# Patient Record
Sex: Female | Born: 1935 | Race: White | Hispanic: No | Smoking: Never smoker
Health system: Southern US, Community
[De-identification: ages and names within clinical notes are randomized; demographics above are authoritative.]

## PROBLEM LIST (undated history)

## (undated) DIAGNOSIS — E7800 Pure hypercholesterolemia, unspecified: Secondary | ICD-10-CM

## (undated) DIAGNOSIS — I480 Paroxysmal atrial fibrillation: Secondary | ICD-10-CM

## (undated) DIAGNOSIS — G2581 Restless legs syndrome: Secondary | ICD-10-CM

## (undated) DIAGNOSIS — I10 Essential (primary) hypertension: Secondary | ICD-10-CM

## (undated) DIAGNOSIS — I639 Cerebral infarction, unspecified: Secondary | ICD-10-CM

## (undated) DIAGNOSIS — M1990 Unspecified osteoarthritis, unspecified site: Secondary | ICD-10-CM

## (undated) DIAGNOSIS — I5032 Chronic diastolic (congestive) heart failure: Secondary | ICD-10-CM

## (undated) DIAGNOSIS — R001 Bradycardia, unspecified: Secondary | ICD-10-CM

## (undated) DIAGNOSIS — F5104 Psychophysiologic insomnia: Secondary | ICD-10-CM

## (undated) DIAGNOSIS — R413 Other amnesia: Principal | ICD-10-CM

## (undated) DIAGNOSIS — I4891 Unspecified atrial fibrillation: Secondary | ICD-10-CM

## (undated) HISTORY — DX: Pure hypercholesterolemia, unspecified: E78.00

## (undated) HISTORY — DX: Essential (primary) hypertension: I10

## (undated) HISTORY — DX: Restless legs syndrome: G25.81

## (undated) HISTORY — DX: Chronic diastolic (congestive) heart failure: I50.32

## (undated) HISTORY — DX: Psychophysiologic insomnia: F51.04

## (undated) HISTORY — DX: Bradycardia, unspecified: R00.1

## (undated) HISTORY — DX: Unspecified atrial fibrillation: I48.91

## (undated) HISTORY — DX: Paroxysmal atrial fibrillation: I48.0

## (undated) HISTORY — DX: Other amnesia: R41.3

## (undated) HISTORY — DX: Unspecified osteoarthritis, unspecified site: M19.90

## (undated) HISTORY — DX: Cerebral infarction, unspecified: I63.9

---

## 1997-11-02 HISTORY — PX: REPLACEMENT TOTAL KNEE BILATERAL: SUR1225

## 1997-12-11 ENCOUNTER — Ambulatory Visit (HOSPITAL_COMMUNITY): Admission: RE | Admit: 1997-12-11 | Discharge: 1997-12-11 | Payer: Self-pay | Admitting: Orthopedic Surgery

## 1998-11-27 ENCOUNTER — Other Ambulatory Visit: Admission: RE | Admit: 1998-11-27 | Discharge: 1998-11-27 | Payer: Self-pay | Admitting: *Deleted

## 1999-05-16 ENCOUNTER — Other Ambulatory Visit: Admission: RE | Admit: 1999-05-16 | Discharge: 1999-05-16 | Payer: Self-pay | Admitting: *Deleted

## 1999-05-16 ENCOUNTER — Encounter (INDEPENDENT_AMBULATORY_CARE_PROVIDER_SITE_OTHER): Payer: Self-pay

## 1999-11-03 HISTORY — PX: OTHER SURGICAL HISTORY: SHX169

## 2000-05-18 ENCOUNTER — Ambulatory Visit (HOSPITAL_COMMUNITY): Admission: RE | Admit: 2000-05-18 | Discharge: 2000-05-18 | Payer: Self-pay | Admitting: Gastroenterology

## 2000-05-26 ENCOUNTER — Encounter: Payer: Self-pay | Admitting: Orthopedic Surgery

## 2000-05-31 ENCOUNTER — Inpatient Hospital Stay (HOSPITAL_COMMUNITY): Admission: RE | Admit: 2000-05-31 | Discharge: 2000-06-02 | Payer: Self-pay | Admitting: Orthopedic Surgery

## 2000-06-02 ENCOUNTER — Inpatient Hospital Stay (HOSPITAL_COMMUNITY)
Admission: RE | Admit: 2000-06-02 | Discharge: 2000-06-08 | Payer: Self-pay | Admitting: Physical Medicine & Rehabilitation

## 2000-08-17 ENCOUNTER — Other Ambulatory Visit: Admission: RE | Admit: 2000-08-17 | Discharge: 2000-08-17 | Payer: Self-pay | Admitting: *Deleted

## 2001-04-27 ENCOUNTER — Encounter: Payer: Self-pay | Admitting: Rheumatology

## 2001-04-27 ENCOUNTER — Encounter: Admission: RE | Admit: 2001-04-27 | Discharge: 2001-04-27 | Payer: Self-pay | Admitting: Rheumatology

## 2001-09-05 ENCOUNTER — Encounter: Payer: Self-pay | Admitting: Orthopedic Surgery

## 2001-09-05 ENCOUNTER — Encounter: Admission: RE | Admit: 2001-09-05 | Discharge: 2001-09-05 | Payer: Self-pay | Admitting: Orthopedic Surgery

## 2001-09-06 ENCOUNTER — Ambulatory Visit (HOSPITAL_BASED_OUTPATIENT_CLINIC_OR_DEPARTMENT_OTHER): Admission: RE | Admit: 2001-09-06 | Discharge: 2001-09-07 | Payer: Self-pay | Admitting: Orthopedic Surgery

## 2002-01-02 ENCOUNTER — Other Ambulatory Visit: Admission: RE | Admit: 2002-01-02 | Discharge: 2002-01-02 | Payer: Self-pay | Admitting: *Deleted

## 2003-10-08 ENCOUNTER — Emergency Department (HOSPITAL_COMMUNITY): Admission: AD | Admit: 2003-10-08 | Discharge: 2003-10-08 | Payer: Self-pay | Admitting: Family Medicine

## 2004-02-26 ENCOUNTER — Encounter: Admission: RE | Admit: 2004-02-26 | Discharge: 2004-02-26 | Payer: Self-pay | Admitting: Orthopedic Surgery

## 2004-02-29 ENCOUNTER — Ambulatory Visit (HOSPITAL_COMMUNITY): Admission: RE | Admit: 2004-02-29 | Discharge: 2004-02-29 | Payer: Self-pay | Admitting: Orthopedic Surgery

## 2004-02-29 ENCOUNTER — Ambulatory Visit (HOSPITAL_BASED_OUTPATIENT_CLINIC_OR_DEPARTMENT_OTHER): Admission: RE | Admit: 2004-02-29 | Discharge: 2004-02-29 | Payer: Self-pay | Admitting: Orthopedic Surgery

## 2005-11-02 DIAGNOSIS — I639 Cerebral infarction, unspecified: Secondary | ICD-10-CM

## 2005-11-02 HISTORY — DX: Cerebral infarction, unspecified: I63.9

## 2006-02-24 ENCOUNTER — Encounter: Admission: RE | Admit: 2006-02-24 | Discharge: 2006-02-24 | Payer: Self-pay | Admitting: Neurology

## 2006-03-17 ENCOUNTER — Encounter (INDEPENDENT_AMBULATORY_CARE_PROVIDER_SITE_OTHER): Payer: Self-pay | Admitting: *Deleted

## 2006-03-17 ENCOUNTER — Ambulatory Visit (HOSPITAL_COMMUNITY): Admission: RE | Admit: 2006-03-17 | Discharge: 2006-03-17 | Payer: Self-pay | Admitting: Neurology

## 2007-07-23 ENCOUNTER — Emergency Department (HOSPITAL_COMMUNITY): Admission: EM | Admit: 2007-07-23 | Discharge: 2007-07-23 | Payer: Self-pay | Admitting: Emergency Medicine

## 2007-11-03 HISTORY — PX: OTHER SURGICAL HISTORY: SHX169

## 2007-11-03 HISTORY — PX: CATARACT EXTRACTION: SUR2

## 2007-12-07 ENCOUNTER — Encounter: Admission: RE | Admit: 2007-12-07 | Discharge: 2007-12-07 | Payer: Self-pay | Admitting: Orthopedic Surgery

## 2008-06-06 ENCOUNTER — Encounter: Admission: RE | Admit: 2008-06-06 | Discharge: 2008-06-06 | Payer: Self-pay | Admitting: Internal Medicine

## 2009-02-07 ENCOUNTER — Emergency Department (HOSPITAL_COMMUNITY): Admission: EM | Admit: 2009-02-07 | Discharge: 2009-02-08 | Payer: Self-pay | Admitting: Emergency Medicine

## 2009-06-21 ENCOUNTER — Ambulatory Visit (HOSPITAL_COMMUNITY): Admission: RE | Admit: 2009-06-21 | Discharge: 2009-06-21 | Payer: Self-pay | Admitting: Obstetrics and Gynecology

## 2009-08-21 ENCOUNTER — Encounter: Admission: RE | Admit: 2009-08-21 | Discharge: 2009-08-21 | Payer: Self-pay | Admitting: Orthopedic Surgery

## 2009-10-23 ENCOUNTER — Ambulatory Visit (HOSPITAL_COMMUNITY): Admission: RE | Admit: 2009-10-23 | Discharge: 2009-10-23 | Payer: Self-pay | Admitting: Orthopaedic Surgery

## 2010-05-08 ENCOUNTER — Inpatient Hospital Stay (HOSPITAL_COMMUNITY): Admission: RE | Admit: 2010-05-08 | Discharge: 2010-05-12 | Payer: Self-pay | Admitting: Orthopedic Surgery

## 2011-01-18 LAB — BASIC METABOLIC PANEL
BUN: 15 mg/dL (ref 6–23)
BUN: 6 mg/dL (ref 6–23)
CO2: 25 mEq/L (ref 19–32)
CO2: 30 mEq/L (ref 19–32)
Calcium: 8.3 mg/dL — ABNORMAL LOW (ref 8.4–10.5)
Calcium: 8.6 mg/dL (ref 8.4–10.5)
Chloride: 93 mEq/L — ABNORMAL LOW (ref 96–112)
Chloride: 97 mEq/L (ref 96–112)
Chloride: 99 mEq/L (ref 96–112)
Creatinine, Ser: 0.59 mg/dL (ref 0.4–1.2)
Creatinine, Ser: 0.71 mg/dL (ref 0.4–1.2)
GFR calc Af Amer: >60 mL/min (ref >60)
GFR calc Af Amer: >60 mL/min (ref >60)
GFR calc Af Amer: >60 mL/min (ref >60)
GFR calc non Af Amer: >60 mL/min (ref >60)
GFR calc non Af Amer: >60 mL/min (ref >60)
GFR calc non Af Amer: >60 mL/min (ref >60)
GFR calc non Af Amer: >60 mL/min (ref >60)
Glucose, Bld: 108 mg/dL — ABNORMAL HIGH (ref 70–99)
Glucose, Bld: 118 mg/dL — ABNORMAL HIGH (ref 70–99)
Potassium: 3.3 mEq/L — ABNORMAL LOW (ref 3.5–5.1)
Potassium: 3.6 mEq/L (ref 3.5–5.1)
Potassium: 3.9 mEq/L (ref 3.5–5.1)
Potassium: 4.1 mEq/L (ref 3.5–5.1)
Sodium: 129 mEq/L — ABNORMAL LOW (ref 135–145)
Sodium: 130 mEq/L — ABNORMAL LOW (ref 135–145)
Sodium: 131 mEq/L — ABNORMAL LOW (ref 135–145)
Sodium: 133 mEq/L — ABNORMAL LOW (ref 135–145)
Sodium: 135 mEq/L (ref 135–145)

## 2011-01-18 LAB — CBC
HCT: 28.5 % — ABNORMAL LOW (ref 36.0–46.0)
HCT: 28.6 % — ABNORMAL LOW (ref 36.0–46.0)
HCT: 32.3 % — ABNORMAL LOW (ref 36.0–46.0)
HCT: 35.7 % — ABNORMAL LOW (ref 36.0–46.0)
Hemoglobin: 12.1 g/dL (ref 12.0–15.0)
Hemoglobin: 9.6 g/dL — ABNORMAL LOW (ref 12.0–15.0)
MCH: 32.5 pg (ref 26.0–34.0)
MCH: 33.2 pg (ref 26.0–34.0)
MCHC: 33.3 g/dL (ref 30.0–36.0)
MCHC: 33.9 g/dL (ref 30.0–36.0)
Platelets: 148 10*3/uL — ABNORMAL LOW (ref 150–400)
Platelets: 171 10*3/uL (ref 150–400)
Platelets: 173 10*3/uL (ref 150–400)
RBC: 2.93 MIL/uL — ABNORMAL LOW (ref 3.87–5.11)
RBC: 3.31 MIL/uL — ABNORMAL LOW (ref 3.87–5.11)
RBC: 3.64 MIL/uL — ABNORMAL LOW (ref 3.87–5.11)
RDW: 13.6 % (ref 11.5–15.5)
RDW: 13.9 % (ref 11.5–15.5)
RDW: 13.9 % (ref 11.5–15.5)
WBC: 6.3 10*3/uL (ref 4.0–10.5)
WBC: 7 10*3/uL (ref 4.0–10.5)

## 2011-01-18 LAB — TYPE AND SCREEN: ABO/RH(D): O POS

## 2011-01-18 LAB — URINALYSIS, ROUTINE W REFLEX MICROSCOPIC
Hgb urine dipstick: NEGATIVE
Ketones, ur: NEGATIVE mg/dL
Specific Gravity, Urine: 1.012 (ref 1.005–1.030)
pH: 6 (ref 5.0–8.0)

## 2011-01-18 LAB — DIFFERENTIAL
Basophils Relative: 0 % (ref 0–1)
Basophils Relative: 1 % (ref 0–1)
Eosinophils Absolute: 0.2 10*3/uL (ref 0.0–0.7)
Lymphocytes Relative: 17 % (ref 12–46)
Monocytes Absolute: 0.8 10*3/uL (ref 0.1–1.0)
Monocytes Relative: 12 % (ref 3–12)
Monocytes Relative: 9 % (ref 3–12)
Neutro Abs: 4.4 10*3/uL (ref 1.7–7.7)
Neutrophils Relative %: 65 % (ref 43–77)
Neutrophils Relative %: 70 % (ref 43–77)

## 2011-01-18 LAB — POCT I-STAT 4, (NA,K, GLUC, HGB,HCT)
Glucose, Bld: 141 mg/dL — ABNORMAL HIGH (ref 70–99)
Hemoglobin: 13.3 g/dL (ref 12.0–15.0)

## 2011-01-18 LAB — CARDIAC PANEL(CRET KIN+CKTOT+MB+TROPI)
CK, MB: 3.5 ng/mL (ref 0.3–4.0)
CK, MB: 5.3 ng/mL — ABNORMAL HIGH (ref 0.3–4.0)
CK, MB: 6.9 ng/mL (ref 0.3–4.0)
Relative Index: 2.5 (ref 0.0–2.5)
Relative Index: 4.2 — ABNORMAL HIGH (ref 0.0–2.5)
Total CK: 156 U/L (ref 7–177)
Troponin I: 0.02 ng/mL (ref 0.00–0.06)

## 2011-01-18 LAB — APTT: aPTT: 34 seconds (ref 24–37)

## 2011-01-18 LAB — URINE MICROSCOPIC-ADD ON

## 2011-01-18 LAB — TSH: TSH: 1.852 u[IU]/mL (ref 0.350–4.500)

## 2011-01-18 LAB — SURGICAL PCR SCREEN
MRSA, PCR: NEGATIVE
Staphylococcus aureus: NEGATIVE

## 2011-01-23 ENCOUNTER — Emergency Department (HOSPITAL_COMMUNITY): Payer: Medicare Other

## 2011-01-23 ENCOUNTER — Emergency Department (HOSPITAL_COMMUNITY)
Admission: EM | Admit: 2011-01-23 | Discharge: 2011-01-23 | Disposition: A | Discharge status: Home / Self Care | Payer: Medicare Other | Attending: Emergency Medicine | Admitting: Emergency Medicine

## 2011-01-23 DIAGNOSIS — I10 Essential (primary) hypertension: Secondary | ICD-10-CM | POA: Insufficient documentation

## 2011-01-23 DIAGNOSIS — E7800 Pure hypercholesterolemia, unspecified: Secondary | ICD-10-CM | POA: Insufficient documentation

## 2011-01-23 DIAGNOSIS — R079 Chest pain, unspecified: Secondary | ICD-10-CM | POA: Insufficient documentation

## 2011-01-23 DIAGNOSIS — Z79899 Other long term (current) drug therapy: Secondary | ICD-10-CM | POA: Insufficient documentation

## 2011-01-23 DIAGNOSIS — M129 Arthropathy, unspecified: Secondary | ICD-10-CM | POA: Insufficient documentation

## 2011-01-23 DIAGNOSIS — M25519 Pain in unspecified shoulder: Secondary | ICD-10-CM | POA: Insufficient documentation

## 2011-01-23 DIAGNOSIS — W010XXA Fall on same level from slipping, tripping and stumbling without subsequent striking against object, initial encounter: Secondary | ICD-10-CM | POA: Insufficient documentation

## 2011-01-23 DIAGNOSIS — S2239XA Fracture of one rib, unspecified side, initial encounter for closed fracture: Secondary | ICD-10-CM | POA: Insufficient documentation

## 2011-01-23 DIAGNOSIS — M546 Pain in thoracic spine: Secondary | ICD-10-CM | POA: Insufficient documentation

## 2011-03-20 NOTE — Discharge Summary (Signed)
[place]. [facility]  Patient:    [name], [name]                            MRN: [id number] Adm. Date:  [id number] Disc. Date: [id number] Attending:  Gilmore Laroche Dictator:   Liliane Channel, P.A.-C.                           Discharge Summary  PRIMARY DIAGNOSIS THIS ADMISSION:  End-stage degenerative joint disease of the left knee.  PROCEDURE WHILE IN HOSPITAL:  Left total knee arthroplasty.  HISTORY OF PRESENT ILLNESS:  The patient is a 77 year old woman with complaints of increasing pain in her left knee.  She underwent a right total knee in January of 1999 with no difficulties postoperatively. She has a many year history of pain in her left knee but has been increasing greatly over the last several months. She has failed conservative measures including physical therapy, steroid injection, nonsteroidal anti-inflammatories, and rest.  She now wishes to proceed with a left total knee arthroplasty understanding the risks versus the benefits.  ALLERGIES:  CODEINE causes nausea.  MEDICATIONS ON ADMISSION: 1. Celebrex 200 mg one p.o. b.i.d. 2. Prevacid 30 mg one p.o. q.d. 3. Pravachol 20 mg one p.o. q.d. 4. Centrum Silver one p.o. q.d. 5. Miacalcin one spray q.d. 6. Caltrate 600+ D _______ one p.o. q.d.  X-rays show positive bone on bone changes in the medial compartment  with positive erosions.  PAST MEDICAL HISTORY:  Childhood disease.  Adult history:  DJD and elevated cholesterol.  PAST SURGICAL HISTORY:  Knee scope in 1994, metacarpal surgery in 1995, D&C in 1988, total knee arthroplasty in 1999.  She has had trouble with severe nausea postoperatively.  SOCIAL HISTORY:  No tobacco.  Positive alcohol use, three or four drinks per week.  No IV drug abuse.  She lives with her husband.  The patient is a retired Pharmacist, hospital.  FAMILY HISTORY:  Mother deceased secondary to CVA.  Father deceases secondary to cancer.  REVIEW OF SYSTEMS:  Positive for  glasses only.  Denies shortness of breath, chest pain or other responses.  PHYSICAL EXAMINATION:  GENERAL APPEARANCE:  The patient is a 65 inch tall, 160 pound female.  VITAL SIGNS:  Temperature 98.1, pulse 60, respiratory rate 16, blood pressure 140/78.  HEENT:  Head is normocephalic and atraumatic.  Ears: TMs are clear.  Eyes: Pupils are equal, round, reactive to light and accommodation.  Nose is clear. Throat is benign.  NECK:  Supple.  Full range of motion.  CHEST:  Clear to auscultation and percussion.  CARDIOVASCULAR:  Regular rate and rhythm.  ABDOMEN:  Soft and nontender with no masses.  Bowel sounds 2+.  EXTREMITIES:  Right knee with well-healed normal scar, neurovascularly intact. Range of motion 0 to 115.  Left knee rule out 5 to 110 degrees with positive crepitus and pain.  Pain in joint.  Neurovascularly intact.  Ligamentously stable.  SKIN: Within normal limits.  PREOPERATIVE LABS:  CBC, CMET, chest x-ray, EKG, PT, and PTT were all within normal limits with the exception of hemoglobin which was 10.8, hematocrit 32.9.  HOSPITAL COURSE:  On the day of admission the patient was taken to the operating room at [facility] where she underwent a left total knee arthroplasty utilizing Osteonics scorpio components, #7 femur, #7 tibia, #7 modified medial patellar button, 10  mm scorpio spacer, medical components were all cemented.  The patient was placed on perioperative antibiotics. For the first 72 hours she was placed on postoperative Coumadin prophylaxis with a target INR of 1.5 to 2 per pharmacy protocol. She was placed on postoperatively PCA for pain control and Hemovac drain was placed in the knee for drainage.  Physical therapy was done the first postoperative day with CPM. Postoperative day #1, the patient was awake and alert, moderate pain, no vomiting, minimal nausea.  Vital signs were stable. Drain output 200 cc, neurovascularly intact  throughout.  Hemoglobin 8.5, PT 14.7.  Physical therapy was continued. She was transfused one unit of autologous blood secondary to lethargy. The second postoperative day, the patient was without complaints. She was afebrile. Hemoglobin 9.3, hematocrit 28.0.  INR 1.5. Dressing was dry.  Hemovac output 100 cc with quiescent tip and was discontinued without difficulty.  Calf was soft and nontender. Foley was discontinued. PCA was discontinued.  She continued with the CPM. She was seen in consultation by physical medicine rehabilitation and felt that she would benefit from rehab stay and a bed was available. She was admitted to inpatient rehab on the second postoperative day.  At the time of her transfer she was well-controlled with oral pain medications. The wound was benign. She was on a regular diet and was making steady progress, weightbearing as tolerated with total knee precautions. We will continue to follow her during her hospital stay in rehab and postoperatively as well. DD:  06/24/00 TD:  06/25/00 Job: [id number]

## 2011-03-20 NOTE — Op Note (Signed)
[place]. [facility]  Patient:    [name]                             MRN: [id number] Proc. Date: 05/31/00 Adm. Date:  [id number] Attending:  Debroah Baller                           Operative Report  PREOPERATIVE DIAGNOSIS:  Degenerative arthritis of the left knee.  POSTOPERATIVE DIAGNOSIS:  Degenerative arthritis of the left knee.  PROCEDURE:  Left total knee arthroplasty.  All components cemented.  Osteonics Scorpio components #7 femur, #7 tibia, #7 modified medial patellar button, and a 10 mm Scorpio spacer.  SURGEON:  Dr. Kathalene Frames. Mayer Camel.  FIRST ASSISTANT:  Alta Corning, M.D.  SECOND ASSISTANT:  Liliane Channel, P.A.C.  ANESTHESIA:  General endotracheal.  ESTIMATED BLOOD LOSS:  Minimal.  FLUID REPLACEMENT:  1100 cc of crystalloids.  DRAINS PLACED:  None.  TOURNIQUET TIME:  1 hour 15 minutes.  INDICATIONS FOR PROCEDURE:  A 77 year old woman who underwent a successful right total knee earlier this year, now desires a left total knee to relieve pain and increase function.  She is aware of the risks and benefits of surgery of bone-on-bone, arthritic changes, erosions of the medial compartment of the left knee.  DESCRIPTION OF PROCEDURE:  The patient is identified by arm band, taken to the operating room at [facility] main hospital, appropriate anesthetic monitors were attached, and general endotracheal anesthesia induced with the patient in supine position.  A lateral post and a foot positioning clamp were then applied to the table.  Tourniquet applied high to the left thigh, and the left lower extremity prepped and draped in the usual sterile fashion from the ankle to the tourniquet.  The limb was wrapped with an Esmarch bandage.  The tourniquet inflated to 350 mmHg, and an anterior midline incision was made approximately 20 cm in length centered over the patella going from above the patella longitudinally over the center of the patella,  and then centered medial to a 3 cm distal to the tibial tubercle.  Small bleeders, skin, and subcutaneous tissue identified and cauterized.  The transverse retinaculum over the patella was incised in line with the skin incision, allowing a medial parapatellar arthrotomy with removal of the prepatellar fat pad and the medial and lateral meniscus upon flexion of the knee, as well as the cruciate ligaments.  A MacKay retractor was placed in the notch to translate the tibia anteriorly.  A lateral Homan retractor and a medial Z retractor were placed as well to enhance the exposure of the tibial spines which were then removed with a power saw after everting the patella.  The proximal tibia was then instrumented with the Osteonics step drill, followed by the intramedullary rod and the 0 degree posterior slope cutting guide, which was then held into place with two pins.  This allowed [place] to remove approximately 2 to 3 mm of bone medially, and 8 to 9 mm of bone laterally.  This was because of the medial erosion of the bone.  We then directed our attention to the distal femur which was instrumented with the Osteonics step drill 2 mm anterior to the PCL origin, followed by a 5 degree left 10 mm distal femoral cutting guide which was pinned into place allowing the distal femoral cut.  We then sized for  a #7 femoral component, and pinned it in 3 degrees of external rotation on the Champer guide cutting base plate.  The anterior, posterior, anterior Champer and posterior Champer cuts were made followed by the Scorpio box cut, and removal of all exstrenuous osteophytes.  The everted patella was then grasped with the patellar cutting guide, and the posterior 9 mm of the patella resected, sized to a #7 modified medial patellar button guide and drilled.  We then hammered into place a #7 left trial femoral component, a 7 trial base plate with a 10 mm PS trial spacer.  Then he was taken through a range  of motion, excellent stability noted.  The patella was noted to track well with the 7 trial button.  We set the rotation of the tibial base plate with intramedullary rods, referencing out the second toe and the iliac crest going about an inch medially.  At this point the trial components were removed.  The tibial base plate was pinned into place, and the deltofit punches were brought up to a #7 cemented punch, and then all surfaces were water-picked clean.  A single batch of palacos methacrylate cement with 750 mg of Zinacef was then mixed, applied to all bony and metallic surfaces, and in order we hammered into place a #7 tibial base plate, a #7 left cemented femoral Scorpio component, a 10 mm Scorpio spacer, and a #7 modified medial patellar button. Excess cement was removed, and the bone had been cleansed and water-picked and dried prior to inserting the cement.  After the cement had cured, we took the knee through a range of motion, noted excellent patellar tracking, good ligamentous stability.  Small bits of cement and bone were removed with rongeurs.  Medium Hemovac drains were placed in the wound.  The parapatellar arthrotomy closed with running #1 Vicryl suture, the subcutaneous tissues with 0 and 2-0 undyed Vicryl suture, and the skin with skin staples.  A dressing of xeroform, 4 x 8 dressings, sponges, Webril, and an Ace wrap was applied.  The tourniquet was let down.  The patient was awakened and taken to the recovery room after placement of femoral block.DD:  05/31/00 TD:  06/01/00 Job: [id number]

## 2011-03-20 NOTE — Op Note (Signed)
NAME:  [name], [name]                               ACCOUNT NO.:  [id number]   MEDICAL RECORD NO.:  [id number]                   PATIENT TYPE:  AMB   LOCATION:  [place]                                  FACILITY:  [facility]   PHYSICIAN:  Youlanda Mighty. Luisa Dago., M.D.          DATE OF BIRTH:  17-Sep-1928   DATE OF PROCEDURE:  02/29/2004  DATE OF DISCHARGE:                                 OPERATIVE REPORT   PREOPERATIVE DIAGNOSIS:  Unstable right index finger distal interphalangeal  joint, 22 years status post implant arthroplasty with a Silastic Swanson  flexible hinge implant.   POSTOPERATIVE DIAGNOSIS:  Unstable right index finger distal interphalangeal  joint, 22 years status post implant arthroplasty with a Silastic Swanson  flexible hinge implant.  Identification of fracture of distal  interphalangeal implant and significant pseudocapsule formation.   OPERATION PERFORMED:  1. Removal of Silastic interphalangeal implant fragments and curettage of     middle and distal phalangeal intramedullary canals to remove granulation     tissue and pseudocapsule following Silastic implant.  2. Arthrodesis of right index finger distal interphalangeal joint with     cancellous allograft and 0.035 inch Kirschner wire fixation times three.   SURGEON:  Youlanda Mighty. Sypher, M.D.   ASSISTANT:  Julian Reil, P.A.   ANESTHESIA:  General sedation/axillary block.   SUPERVISING ANESTHESIOLOGIST:  Jessy Oto. Albertina Parr, M.D.   INDICATIONS FOR PROCEDURE:  [name] is a 77 year old woman who is a long  term patient with the orthopedic and hand specialist.  She is status post  bilateral thumb carpometacarpal soft tissue interposition arthroplasty and  has had a number of arthritis procedures including implant arthroplasty of  interphalangeal joints.  She has also had arthrodesis of some distal  interphalangeal joints for pain control.   One week prior she fell, sustaining a comminuted impacted fracture of her  right  distal radius and was seen by Dr. Burney Gauze, who was on call at that  time.  Dr. Burney Gauze performed a closed reduction achieving a virtually  anatomic reduction; however, given the nature of the fracture, it was well  understood by all parties that it was likely that her fracture would subside  and require open reduction internal fixation with a volar plate system.  After informed consent and upon return to the office with x-rays  demonstrating settling of her fracture, Dr. Burney Gauze recommended proceeding  with ORIF of the radius and, as we had been planning, arthrodesis of the  right index finger DIP joint due to a fractured and unstable implant  arthroplasty.  Veronica. [place] [name] requested that both procedures be performed under a  single anesthesia at this time.  After informed consent with Dr. Albertina Parr,  axillary block was chosen as an appropriate anesthetic choice.  Dr. Burney Gauze  had completed ORIF of the right distal radius utilizing a 7-peg DVR plate  system and retired from the room  to allow Dr. Daylene Katayama to proceed at this  time.   DESCRIPTION OF PROCEDURE:  Jarieliz Raquet was brought to the operating room and  placed in supine position on the operating table.  Dr. Albertina Parr had placed  an axillary block in the holding area leading to excellent anesthesia of the  right upper extremity.  The right arm was prepped with Betadine soap and  solution and sterilely draped.  1 g of Ancef was administered as an IV  prophylactic antibiotic.  After completion of the open reduction internal  fixation of the distal radius, Dr. Burney Gauze retired from the room and we  addressed the right index finger with resection of the previous surgical  scar and creation of a Mercedes type dorsal incision exposing the extensor  mechanism.  The previous Mersilene suture repairing the extensor tendon was  removed followed by incision of the extensor tendon and exposure of the  implant arthroplasty pseudocapsule.  A rather abundant  capsule had formed.  This was incised with a 38 Beaver blade.  With careful dissection, the  proximal and distal segments of the implant arthroplasty were removed from  the middle and distal phalanges respectively.  A microcuret was then used to  remove the pseudocapsule in the intramedullary canal and a rongeur was used  to remove all granulation tissue from the margins of the distal and middle  phalanges.  Cancellous allograft was then used to fill the intramedullary  canal with compression, obliterating the space created by the arthroplasty  followed by use of a very dense piece of cancellous bone which was fashioned  in a diamond shape to allow insertion into the intramedullary canal of the  distal phalanx and intramedullary canal of the middle phalanx.  Three 0.035  inch Kirschner wires were placed with retrograde technique and used to  secure the DIP joint in virtually neutral position and very slight  supination.  AP lateral C-arm images documented satisfactory cortical  capture of the Kirschner wires both proximally and distally.  Further  cancellous graft was packed into all the remaining space between the distal  and middle phalanges followed by repair of the extensor tendon with a  mattress suture of 4-0 Mersilene.  The wound was then irrigated and closed  with interrupted suture of 5-0 nylon.  A compressive dressing was applied to  the finger, hand and wrist followed by application of a sugar tong splint  maintaining the forearm in neutral position.  There were no apparent  complications.  [name] tolerated the surgery and anesthesia well.   She will be discharged in a sling with prescriptions for Dilaudid 2 mg one  or two tablets by mouth every four to six hours as needed for pain, total of  20 tablets without refill.  She is also given a prescription for Keflex 500  mg one by mouth every eight hours for four days as a prophylactic  antibiotic.  Note that Dr. Burney Gauze will  see Veronica. Hall in 24 hours and discharge her from  the recovery care center.                                               Youlanda Mighty Luisa Dago., M.D.    RVS/MEDQ  D:  02/29/2004  T:  03/01/2004  Job:  [id number]

## 2011-03-20 NOTE — Procedures (Signed)
[place]. [facility]  Patient:    [name], [name]                            MRN: [id number] Proc. Date: 05/18/00 Adm. Date:  [id number] Attending:  Sherrin Daisy CC:         Ludwig Lean. Doreatha Lew, M.D.                           Procedure Report  PROCEDURE:  Colonoscopy.  ENDOSCOPIST:  Joyice Faster. Edwards, M.D.  MEDICATIONS:  Fentanyl 75 mcg, Versed 5 mg IV.  INDICATIONS: The patient is a nice 77 year old woman with a strong family history of colon cancer. Father died at age 50 of colon cancer.  SCOPE:  Adult Olympus video colonoscope.  DESCRIPTION OF PROCEDURE:  The procedure was explained to the patient and consent obtained. With the patient in the left lateral decubitus position, the Olympus adult video colonoscope inserted under direct visualization. The prep was quite good using abdominal pressure and position changes. We were able to advance to the cecum and the right lower quadrant transilluminated and ileocecal valve was seen. The scope was withdrawn. Cecum, ascending colon, hepatic flexure, transverse colon, splenic flexure, descending, and sigmoid colon seen well. No polyps or other lesions seen. Scattered diverticula in the sigmoid colon. No other significant diverticulosis. Internal hemorrhoids seen in the rectum. Scope withdrawn. The patient tolerated the procedure well.  ASSESSMENT: 1. No evidence of colon polyps in this woman for high risk for colon    neoplasia. 2. Internal hemorrhoids.  PLAN:  We will recommend repeating in five years with yearly Hemoccults, etc. DD:  05/18/00 TD:  05/19/00 Job: [id number]

## 2011-03-20 NOTE — Discharge Summary (Signed)
[place]. [facility] [name]  Patient:    [name], [name]                            MRN: [id number] Adm. Date:  [id number] Disc. Date: [id number] Attending:  Gilmore [name] Dictator:   [name]. Goodlow, P.A. CC:         [name], M.D.             [name]. [name], M.D.                           Discharge Summary  DISCHARGE DIAGNOSES: 1. Left total knee replacement May 31, 2000. 2. Postoperative anemia. 3. Gastroesophageal reflux disease. 4. Urinary tract infection of Enterococcus. 5. Hyperlipidemia. 6. Right total knee replacement in 1999.  HISTORY OF PRESENT ILLNESS:  A 77 year old white female admitted July 30 with a history of a right total knee replacement in 1999, now presents with left knee pain and no relief with conservative care.  X-ray show advanced degenerative joint disease.  She underwent a left total knee replacement on July 30 per Dr. Mayer [name] and placed on Coumadin for deep venous thrombosis prophylaxis and weightbearing as tolerated.  Postoperative anemia and transfused.  Pain management ongoing.  Minimal assistance for ambulation, latest INR 1.5.  Hemoglobin 9.3.  Admitted for comprehensive rehabilitation program.  PAST MEDICAL HISTORY:  See Discharge Diagnoses.  PAST SURGICAL HISTORY:  Right carpal tunnel release, right total knee arthroplasty in 1999.  She has had a D&C.  ALLERGIES:  CODEINE.  MEDICATIONS PRIOR TO ADMISSION: 1. Pravachol 20 mg daily. 2. Celebrex 200 mg twice daily. 3. Calcitonin nasal spray daily. 4. Os-Cal daily. 5. Prevacid 30 mg daily. 6. Multivitamin.  SOCIAL HISTORY:  Occasional alcohol, no tobacco.  Primary M.D. is Dr. Doreatha [name]. She lives with her husband in [place], independent prior to admission. One-level home with three steps to entry.  Husband has a history of cerebrovascular accident in January 2001 but can assist on discharge.  HOSPITAL COURSE:   The patient did well while on rehabilitation  services with therapies initiated on b.i.d. basis.  The following issues were followed during the patients rehabilitation course.  Pertaining to [name] left total knee replacement, it remained stable. Surgical site healing nicely with no signs of infection.  She would follow up with her orthopedic surgeon for removal of staples.  Neurovascular sensation remained intact.  She continued on Coumadin for deep venous thrombosis prophylaxis.  Venous Doppler studies prior to discharge were negative.  She would complete Coumadin protocol of four weeks total.  Prothrombin times were to be arranged with home health nurse provided and results to Dr. Jarvis [name] at [phone].  Postoperative anemia stable with latest hemoglobin 9.7, hematocrit 28.5, continued on iron supplement.  She would maintain Prevacid for history of gastroesophageal reflux disease.  She had a urinalysis study showing greater than 100,000 Enterococcus which was sensitive to ampicillin which was initiated on June 05, 2000, for a total of seven-day course.  Again, she remained afebrile throughout her course.  Overall, for her functional mobility, she was ambulating extended distances with a walker, 200 feet, modified independence, essentially independent to standby assistance in all areas of activities of daily living of dressing, grooming, and homemaking.  Overall, her strength and endurance greatly improved, and she was encouraged with her overall progress.  She was discharged to home with home  health therapies.  LABORATORY DATA:  Latest labs showed an INR of  2.0, hemoglobin 9.7, hematocrit 28.5.  Chemistries showed a sodium 138, potassium 3.8, BUN 10, creatinine 0.6.  DISCHARGE MEDICATIONS: 1. Coumadin daily with dose to be established at time of discharge x 16    more days. 2. Calcitonin nasal spray 1 puff daily. 3. Trinsicon twice daily. 4. Pravachol 20 mg daily. 5. Prevacid 30 mg daily. 6. Multivitamin  daily. 7. OxyCodone immediate release as needed for pain. 8. OxyContin CR 10 mg every 12 hours x 1 week. 9. Amoxicillin 500 mg 3 times daily x 3 days.  ACTIVITY:  Weightbearing as tolerated with walker.  DIET:  Regular.  WOUND CARE:  Follow up with Dr. Mayer [name] one week for removal of staples.  SPECIAL INSTRUCTIONS:  No driving, no aspirin or ibuprofen while on Coumadin. Home health physical therapist.  Home health nurse for prothrombin time with results to Dr. Jarvis [name] until Coumadin protocol completed.  Follow up with Dr. Doreatha [name], medical management as needed. DD:  06/08/00 TD:  06/09/00 Job: [id number]

## 2011-03-20 NOTE — Op Note (Signed)
[place]. [facility]  Patient:    [name] Visit Number: [id number] MRN: [id number]          Service Type: DSU Location: [facility] [place] Attending Physician:  [name] Dictated by:   [name]., M.D. Proc. Date: 09/06/01 Admit Date:  09/06/2001   CC:         [name], M.D.   Operative Report  PREOPERATIVE DIAGNOSIS:  Massive left rotator cuff tear with complete uncoverage of humeral head and early cuff tear arthropathy with acromioclavicular degenerative arthritis.  POSTOPERATIVE DIAGNOSIS:  Massive left rotator cuff tear with complete uncoverage of humeral head and early cuff tear arthropathy with acromioclavicular degenerative arthritis.  PROCEDURES: 1. Reconstruction of left rotator cuff with transfer of superior half of    subscapularis tendon and reconstruction of cuff defect with a DePuy Restore    patch. 2. Open resection of distal clavicle, i.e., Mumford procedure, with    imbrication of anterior third of deltoid and trapezius muscle to close dead    space created by distal clavicle resection. 3. Incidental acromioplasty, coracoacromial ligament release, and bursectomy. 4. Diagnostic arthroscopy, left glenohumeral joint, to assay degree of    degenerative arthritis of humeral head, glenoid, and quality of other    intra-articular structures.  SURGEON:  Veronica Mighty. Sypher, [name]., M.D.  ASSISTANT:  [name], P.A.  ANESTHESIA:  General orotracheal supplemented by interscalene block supervised by the anesthesiologist, [name].  INDICATION:  [name] is a well-known 77 year old woman who has been a patient with my practice for approximately 18 years.  She had had multiple prior orthopedic predicaments but recently has had an episode of severe polymyalgia rheumatica that was treated by [name] with steroids and other appropriate anti-inflammatory medications and during her treatment with Dr.  Estanislado [name], [name] registered a complaint of bilateral shoulder pain.  [name] obtained bilateral shoulder MRIs, which revealed significant stage 3+ impingement with retracted rotator cuff tears.  [name] has had to postpone surgery for a lengthy period of time, as her husband has had a number of surgical procedures and a cerebrovascular accident.  She ultimately requested proceeding with surgery at this time in an effort to relieve severe bilateral shoulder pain that has become chronic and disabling.  DESCRIPTION OF PROCEDURE:  [name] was brought to the operating room and placed in the supine position on the operating table.  Following interscalene block in the holding area, anesthesia was satisfactory in the left upper extremity and forequarter.  Following the induction of general orotracheal anesthesia, she was positioned in the beach chair position and with a shoulder and head holder designed for shoulder arthroscopy.  After routine Duraprep of the entire right upper extremity and forequarter, impervious arthroscopy drapes were applied with the arm draped free with stockinette and Coban.  The arthroscope was introduced through a standard posterior portal. Diagnostic arthroscopy revealed a massive rotator cuff tear extending from the superior subscapularis across the biceps tendon and rotator interval, posteriorly involving the entire supraspinatus and infraspinatus and about half of the teres minor.  The tear was retracted.  A lateral portal was created, and a Kocher clamp was used to see if the tendon could be mobilized the tendon to within 1 cm of the greater tuberosity by applying traction; therefore, we elected to proceed directly to open repair. We completed the diagnostic arthroscopy by examining the inferior recess, superior recess, anterior glenohumeral ligament, and labrum, and found  these to be in normal condition for a 77 year old woman.  There was not  a significant degree of glenohumeral degenerative arthritis noted.  The long head of biceps had a 40% degenerative tear at the bicipital groove.  The scope was removed and surgery proceeded with open resection of the distal clavicle and reconstruction of this very substantial rotator cuff tear.  A 10 cm incision was fashioned from the distal 2 cm of clavicle to the anterior-middle third junction of the deltoid.  This was taken sharply down to to the clavicle, AC joint, and anterior acromion.  A scalpel was used to elevate the anterior third of the deltoid off of the acromion, and the coracoacromial ligament was taken down with cutting cautery.  The AC capsule was incised with cutting cautery, and subperiosteal dissection revealed the distal 2 cm of clavicle.  The clavicle was removed with an oscillating saw.  This specimen revealed significant degenerative arthritis of the [facility] joint and complete destruction of the meniscus.  The medial osteophyte at the acromial side of the [facility] joint was removed with a rongeur.  An anterior acromioplasty was performed, leveling the acromion to a type 1 morphology.  A thorough bursectomy allowed [place] to review the complex rotator cuff tear.  The supraspinatus, infraspinatus, the anterior half of the teres minor had completely pulled off the greater tuberosity and were retracted posteriorly. A portion of the tendon was balled up deep to the [facility] joint and was rather necrotic.  This was debrided thoroughly, and the remaining portions of the tendon were gradually gathered after mobilization with a Cobb elevator and Kocher clamp with a #2 Kevlar suture.  I was ultimately able to mobilize the supraspinatus anteriorly and repaired it to the tissues of the rotator interval.  The coracohumeral ligament was released.  I transferred the superior one-half of the subscapularis.  It was released from the lesser tuberosity by cutting cautery both superiorly and  laterally to  repair to the remaining portion of the supraspinatus, covering the humeral head completely with healthy rotator cuff tendon.  This left a lateral defect measuring approximately 2.5 cm in height and approximately 15 mm in width.  The greater tuberosity was decorticated with the power bur, and the profile of the tuberosity was lowered approximately 4 mm.  A bony trough measuring 3 cm in width and approximately 15 mm in height was created.  The cancellous bone was too osteoporotic to allow placement of the suture anchor; therefore, a suture anchor was placed on the lateral aspect of the humerus through the cortex.  A double-armed #2 Ethibond suture was used to anchor a DePuy Restore patch at the lateral periosteum, and then the triangular defect created between the subscapularis and the anterior edge of the supraspinatus was closed in a watertight closure with the Restore patch and a running suture of 0 Panacryl.  Multiple Kevlar sutures were used to close the interval between the supraspinatus and subscapularis.  I used a portion of the Restore patch to cover the repair and provide a smooth gliding surface for early active range of motion exercises.  Extensive bursectomy was completed to allow full visualization of the deltoid anatomy.  Ultimately the deltoid anatomy was repaired to the trapezius, closing the dead space at the site of the distal clavicle resection, and the anterior third of the deltoid was repaired to through-bone sutures and through-capsular sutures at the [facility] joint on the acromion.  An anatomic repair was achieved with very slight superior transfer  of the anterior third of the deltoid.  The wound was thoroughly lavaged with sterile saline, followed by triple antibiotic solution, followed by repair of the deltoid split with mattress sutures of 0 Vicryl and repair of the skin with subdermal sutures of 3-0 Vicryl and intradermal 3-0 Prolene and  Steri-Strips.  Ms. Tessmann was placed in a sling, awakened from anesthesia, and transferred to the recovery room with stable vital signs.  She will be admitted to the recovery care center for observation of her vital signs and appropriate analgesics in the form of IV PCA morphine and IV Dilaudid.  She will be given Ancef 1 g IV q.8h. x 24 hours.  She is noted to be allergic to codeine and will not be given codeine products or their cogeners.  There were no apparent complications. Dictated by:   [name]., M.D. Attending Physician:  [name] DD:  09/06/01 TD:  09/07/01 Job: [id number]

## 2011-03-20 NOTE — Op Note (Signed)
NAME:  [name], [name]                               ACCOUNT NO.:  [id number]   MEDICAL RECORD NO.:  [id number]                   PATIENT TYPE:  AMB   LOCATION:  [place]                                  FACILITY:  [place]   PHYSICIAN:  Sheral Apley. Burney Gauze, M.D.           DATE OF BIRTH:  [birth year]   DATE OF PROCEDURE:  DATE OF DISCHARGE:                                 OPERATIVE REPORT   PREOPERATIVE DIAGNOSIS:  Right intraarticular distal radius fracture.   POSTOPERATIVE DIAGNOSIS:  Right intraarticular distal radius fracture.   PROCEDURE:  Open reduction, internal fixation, above using DVR plate and  screw.   SURGEON:  Dr. Sheral Apley. Weingold.   ASSISTANT:  Youlanda Mighty. Sypher, MD.   ANESTHESIA:  Axillary block.   TOURNIQUET TIME:  The tourniquet time was 38 minutes.   COMPLICATIONS:  None.   DRAINS:  None.   DESCRIPTION OF PROCEDURE:  The patient was taken to the operating room.  After the induction of adequate axillary block analgesia, the right upper  extremity was prepped and draped in the usual sterile fashion.  Esmarch was  used to exsanguinate the limb.  Tourniquet was then inflated 260 mmHg.  At  this point in time, a longitudinal incision was made over the palpable  border of the Flexor carpi radialis tendon.  The incision was taken down  through the skin and subcutaneous tissues.  The FCR sheath was massaged, was  retracted to midline.  The radial artery to the outside.  The interval  between these two was developed down to the level of the pronator quadratus.  The pronator quadratus with subperiosteally stripped off the distal radius  thus exposing the fracture site.  The fracture was reduced.  The DVR plate  was then fastened to the volar aspect of the distal radius with the  appropriate cortical screw in the slotted hold.  Intraoperative x-rays  showed good reduction with the equilateral oblique view.  The remaining  cortical screws were filled, followed by placing the  distal smooth pegs  under direct and fluoroscopic guidance.  Intraoperative x-rays showed good  reduction in both the AP, lateral and oblique views.  The wound was  thoroughly irrigated and closed loosely in layers of 3-0 Vicryl in the  quadratus, and a running 3-0 Prolene subcuticular stitch on the skin.  Steri-  Strips, 4x4s, Fluffs, and a compressive dressing was applied.   The patient went on to have a second procedure done by Dr. Daylene Katayama and his  assistant, Leverne Humbles, P.A., for a right __________DIP fusion.                                               Sheral Apley Burney Gauze, M.D.    MAW/MEDQ  D:  03/01/2004  T:  03/01/2004  Job:  [id number]

## 2011-06-03 HISTORY — PX: FEMUR FRACTURE SURGERY: SHX633

## 2011-06-03 HISTORY — PX: OTHER SURGICAL HISTORY: SHX169

## 2011-06-04 ENCOUNTER — Emergency Department (HOSPITAL_COMMUNITY): Payer: Medicare Other

## 2011-06-04 ENCOUNTER — Emergency Department (HOSPITAL_COMMUNITY)
Admission: EM | Admit: 2011-06-04 | Discharge: 2011-06-04 | Disposition: A | Discharge status: Still In Hospital | Payer: Medicare Other | Source: Home / Self Care | Attending: Emergency Medicine | Admitting: Emergency Medicine

## 2011-06-04 ENCOUNTER — Inpatient Hospital Stay (HOSPITAL_COMMUNITY): Payer: Medicare Other

## 2011-06-04 ENCOUNTER — Inpatient Hospital Stay (HOSPITAL_COMMUNITY)
Admission: AD | Admit: 2011-06-04 | Discharge: 2011-06-10 | DRG: 481 | Disposition: A | Discharge status: Skilled Nursing Facility | Payer: Medicare Other | Source: Other Acute Inpatient Hospital | Attending: Orthopaedic Surgery | Admitting: Orthopaedic Surgery

## 2011-06-04 DIAGNOSIS — S72143A Displaced intertrochanteric fracture of unspecified femur, initial encounter for closed fracture: Principal | ICD-10-CM | POA: Diagnosis present

## 2011-06-04 DIAGNOSIS — D62 Acute posthemorrhagic anemia: Secondary | ICD-10-CM | POA: Diagnosis not present

## 2011-06-04 DIAGNOSIS — Z8673 Personal history of transient ischemic attack (TIA), and cerebral infarction without residual deficits: Secondary | ICD-10-CM

## 2011-06-04 DIAGNOSIS — E871 Hypo-osmolality and hyponatremia: Secondary | ICD-10-CM | POA: Diagnosis present

## 2011-06-04 DIAGNOSIS — Z96619 Presence of unspecified artificial shoulder joint: Secondary | ICD-10-CM

## 2011-06-04 DIAGNOSIS — I4891 Unspecified atrial fibrillation: Secondary | ICD-10-CM

## 2011-06-04 DIAGNOSIS — E7800 Pure hypercholesterolemia, unspecified: Secondary | ICD-10-CM | POA: Diagnosis present

## 2011-06-04 DIAGNOSIS — W19XXXA Unspecified fall, initial encounter: Secondary | ICD-10-CM

## 2011-06-04 DIAGNOSIS — Z79899 Other long term (current) drug therapy: Secondary | ICD-10-CM

## 2011-06-04 DIAGNOSIS — K219 Gastro-esophageal reflux disease without esophagitis: Secondary | ICD-10-CM | POA: Diagnosis present

## 2011-06-04 DIAGNOSIS — Z96659 Presence of unspecified artificial knee joint: Secondary | ICD-10-CM

## 2011-06-04 DIAGNOSIS — I10 Essential (primary) hypertension: Secondary | ICD-10-CM | POA: Diagnosis present

## 2011-06-04 DIAGNOSIS — S7223XA Displaced subtrochanteric fracture of unspecified femur, initial encounter for closed fracture: Secondary | ICD-10-CM | POA: Diagnosis present

## 2011-06-04 LAB — DIFFERENTIAL
Basophils Absolute: 0 10*3/uL (ref 0.0–0.1)
Basophils Relative: 0 % (ref 0–1)
Eosinophils Absolute: 0 10*3/uL (ref 0.0–0.7)
Monocytes Absolute: 0.6 10*3/uL (ref 0.1–1.0)
Monocytes Relative: 5 % (ref 3–12)

## 2011-06-04 LAB — URINE MICROSCOPIC-ADD ON

## 2011-06-04 LAB — TYPE AND SCREEN
ABO/RH(D): O POS
Antibody Screen: NEGATIVE

## 2011-06-04 LAB — URINALYSIS, ROUTINE W REFLEX MICROSCOPIC
Bilirubin Urine: NEGATIVE
Glucose, UA: >1000 mg/dL — AB
Ketones, ur: NEGATIVE mg/dL
Leukocytes, UA: NEGATIVE
Nitrite: NEGATIVE
Specific Gravity, Urine: 1.029 (ref 1.005–1.030)
pH: 5 (ref 5.0–8.0)

## 2011-06-04 LAB — COMPREHENSIVE METABOLIC PANEL
Albumin: 3.2 g/dL — ABNORMAL LOW (ref 3.5–5.2)
Alkaline Phosphatase: 84 U/L (ref 39–117)
BUN: 29 mg/dL — ABNORMAL HIGH (ref 6–23)
Calcium: 9.3 mg/dL (ref 8.4–10.5)
Creatinine, Ser: 0.56 mg/dL (ref 0.50–1.10)
GFR calc Af Amer: >60 mL/min (ref >60)
Glucose, Bld: 181 mg/dL — ABNORMAL HIGH (ref 70–99)
Potassium: 4.2 mEq/L (ref 3.5–5.1)
Total Protein: 6.4 g/dL (ref 6.0–8.3)

## 2011-06-04 LAB — POCT I-STAT 4, (NA,K, GLUC, HGB,HCT)
HCT: 25 % — ABNORMAL LOW (ref 36.0–46.0)
Hemoglobin: 8.5 g/dL — ABNORMAL LOW (ref 12.0–15.0)
Potassium: 4.3 mEq/L (ref 3.5–5.1)
Sodium: 132 mEq/L — ABNORMAL LOW (ref 135–145)

## 2011-06-04 LAB — CBC
MCH: 29.2 pg (ref 26.0–34.0)
MCHC: 32.9 g/dL (ref 30.0–36.0)
Platelets: 210 10*3/uL (ref 150–400)
RDW: 16 % — ABNORMAL HIGH (ref 11.5–15.5)

## 2011-06-04 LAB — SURGICAL PCR SCREEN: MRSA, PCR: NEGATIVE

## 2011-06-04 LAB — ABO/RH: ABO/RH(D): O POS

## 2011-06-05 LAB — CBC
HCT: 31.8 % — ABNORMAL LOW (ref 36.0–46.0)
Hemoglobin: 10.7 g/dL — ABNORMAL LOW (ref 12.0–15.0)
MCH: 29.5 pg (ref 26.0–34.0)
MCHC: 33.6 g/dL (ref 30.0–36.0)

## 2011-06-05 LAB — BASIC METABOLIC PANEL
BUN: 13 mg/dL (ref 6–23)
CO2: 27 mEq/L (ref 19–32)
Chloride: 99 mEq/L (ref 96–112)
Creatinine, Ser: <0.47 mg/dL — ABNORMAL LOW (ref 0.50–1.10)
Glucose, Bld: 154 mg/dL — ABNORMAL HIGH (ref 70–99)

## 2011-06-06 LAB — BASIC METABOLIC PANEL
BUN: 17 mg/dL (ref 6–23)
Chloride: 100 mEq/L (ref 96–112)
GFR calc Af Amer: >60 mL/min (ref >60)
Potassium: 4.7 mEq/L (ref 3.5–5.1)

## 2011-06-06 LAB — CROSSMATCH
ABO/RH(D): O POS
Unit division: 0

## 2011-06-06 LAB — PROTIME-INR: INR: 1.45 (ref 0.00–1.49)

## 2011-06-06 LAB — CBC
HCT: 29.6 % — ABNORMAL LOW (ref 36.0–46.0)
Hemoglobin: 9.9 g/dL — ABNORMAL LOW (ref 12.0–15.0)
RDW: 15.5 % (ref 11.5–15.5)
WBC: 10.5 10*3/uL (ref 4.0–10.5)

## 2011-06-07 DIAGNOSIS — I059 Rheumatic mitral valve disease, unspecified: Secondary | ICD-10-CM

## 2011-06-07 LAB — BASIC METABOLIC PANEL
CO2: 26 mEq/L (ref 19–32)
Chloride: 99 mEq/L (ref 96–112)
Creatinine, Ser: 0.64 mg/dL (ref 0.50–1.10)
Potassium: 3.8 mEq/L (ref 3.5–5.1)
Sodium: 130 mEq/L — ABNORMAL LOW (ref 135–145)

## 2011-06-07 LAB — PROTIME-INR: INR: 1.26 (ref 0.00–1.49)

## 2011-06-07 LAB — CBC
HCT: 25.5 % — ABNORMAL LOW (ref 36.0–46.0)
Hemoglobin: 8.7 g/dL — ABNORMAL LOW (ref 12.0–15.0)
MCHC: 34.1 g/dL (ref 30.0–36.0)

## 2011-06-08 LAB — PROTIME-INR
INR: 1.13 (ref 0.00–1.49)
Prothrombin Time: 14.7 seconds (ref 11.6–15.2)

## 2011-06-08 LAB — HEMOGLOBIN AND HEMATOCRIT, BLOOD
HCT: 27.9 % — ABNORMAL LOW (ref 36.0–46.0)
Hemoglobin: 9.4 g/dL — ABNORMAL LOW (ref 12.0–15.0)

## 2011-06-08 LAB — BASIC METABOLIC PANEL
Calcium: 8.6 mg/dL (ref 8.4–10.5)
GFR calc Af Amer: >60 mL/min (ref >60)
GFR calc non Af Amer: >60 mL/min (ref >60)
Sodium: 131 mEq/L — ABNORMAL LOW (ref 135–145)

## 2011-06-09 LAB — PROTIME-INR
INR: 1.74 — ABNORMAL HIGH (ref 0.00–1.49)
Prothrombin Time: 20.7 seconds — ABNORMAL HIGH (ref 11.6–15.2)

## 2011-06-09 NOTE — Consult Note (Signed)
  [name], CAINES NO.:  [id number]  MEDICAL RECORD NO.:  [id number]  LOCATION:  [id number]                         FACILITY:  [place]  PHYSICIAN:  Phylliss Bob, MD      DATE OF BIRTH:  [birth year]  DATE OF CONSULTATION:  06/04/2011 DATE OF DISCHARGE:                                CONSULTATION   CHIEF COMPLAINT:  Right hip pain.  HISTORY OF PRESENT ILLNESS:  [name] is a very pleasant 77 year old female who is a patient of Dr. Tania Ade.  At approximately midnight on the evening of June 03, 2011, the patient fell on her right hip.  She was transferred to [facility] and I was contacted with the diagnosis of her right intertrochanteric fracture.  There were no beds available less along the patient was transferred to [facility].  I did evaluate the patient on the morning of June 04, 2011 and the patient was noted to have severe pain in the right hip.  She was unable to bear weight on the right hip.  She states the pain was better at the time of my evaluation with her, as compared when her pain began.  PAST MEDICAL HISTORY: 1. Hypertension. 2. Hypercholesterolemia. 3. Status post TIA.  PAST SURGICAL HISTORY:  The patient is status post a right shoulder arthroscopy as well as numerous joint replacements.  MEDICATIONS:  Toviaz, aspirin, omega III fatty acids, zinc, hydralazine, vitamin B12, calcium, vitamin D, Advair, Neurontin, tramadol, Aricept, meloxicam, lisinopril, and Zocor.  ALLERGIES:  None.  SOCIAL HISTORY:  The patient lives alone.  She is widowed.  She does drink alcohol occasionally and denies tobacco use.  PHYSICAL EXAMINATION:  GENERAL:  The patient is alert and oriented x3 in no acute distress. EXTREMITIES:  The right lower extremity is slightly shortened as compared to the left.  She is neurovascular intact distally.  IMAGING:  I did review AP and lateral views of the patient's right hip which are consistent with  an unstable intermittently displaced right intertrochanteric fracture.  ASSESSMENT:  Right intertrochanteric fracture.  PLAN:  I did discuss with Ms. Sandine that her injury is an operative injury.  However, note that it is in her best interest that we get this addressed as soon as is reasonably feasible.  I did go on to explain her my operative schedule for today, I was rather full and I did reach out to Dr. Jean Rosenthal who did state that he would be able to take the patient to surgery for intramedullary fixation of her fracture later today.  This was fully discussed with the patient.  This was also discussed with Dr. Tamera Punt.  The patient will subsequently be transferred to Dr. Trevor Mace service and the plan is for him to go forward with surgery later today as noted above.  The patient will be kept n.p.o.     Phylliss Bob, MD     MD/MEDQ  D:  06/04/2011  T:  06/04/2011  Job:  [id number]  cc:   Tania Ade, MD  Electronically Signed by Phylliss Bob  on 06/09/2011 05:45:36 PM

## 2011-06-11 NOTE — Discharge Summary (Signed)
  NAMEERWIN, BARBATI NO.:  [id number]  MEDICAL RECORD NO.:  [id number]  LOCATION:  2033                         FACILITY:  [facility]  PHYSICIAN:  Lind Guest. Ninfa Linden, M.D.DATE OF BIRTH:  06/20/1928  DATE OF ADMISSION:  06/04/2011 DATE OF DISCHARGE:  06/10/2011                              DISCHARGE SUMMARY   ADDENDUM  Ms. Sahadeo remained in the hospital for a few more days after the previous discharge summary due to needing to make sure her atrial fibrillation which was new in onset, remained well controlled.  A rehabilitation consult was called, but then canceled because she did not qualify for rehabilitation.  It was recommend to do physical therapy that she undergo skilled nursing placement.  The social work service worked with the patient and the family to gain admission to skilled nursing facility for further rehabilitation.  DISPOSITION:  Discharge to skilled nursing facility.  DISCHARGE MEDICATIONS:  Note, these are the medications that the patient should be on and are the most recent medications. 1. Amiodarone 200 mg p.o. daily. 2. Metoprolol 12.5 mg p.o. at bedtime. 3. Metoprolol 25 mg p.o. daily in the a.m. 4. Calcium carbonate (Os-Cal) one tablet p.o. daily. 5. Vitamin C 500 mg p.o. daily. 6. Aricept 10 mg p.o. at bedtime. 7. Vitamin D3 5000 units p.o. daily. 8. Lovaza 2 mg p.o. at bedtime. 9. Zocor 40 mg p.o. q. 1800 hours daily. 10.Coumadin 2.5 mg p.o. daily. 11.Prinivil 10 mg p.o. daily. 12.Tramadol 50 one to two p.o. q.4-6 hours p.r.n. pain. 13.Robaxin 500 mg p.o. q.6 hours p.r.n. spasms. 14.Norco 5/325 one to two p.o. q.4-6 hours p.r.n. pain.  DISCHARGE INSTRUCTIONS:  Again, while she is at skilled nursing facility, she can get her incision wet in the shower.  Shoulder remains 30% weightbearing with followup in the office with me in a week or two weeks.    Lind Guest. Ninfa Linden, M.D.    CYB/MEDQ  D:  06/10/2011  T:   06/10/2011  Job:  [id number]  Electronically Signed by Jean Rosenthal M.D. on 06/11/2011 11:42:05 PM

## 2011-06-11 NOTE — Discharge Summary (Signed)
  [name], [name] NO.:  [id number]  MEDICAL RECORD NO.:  [id number]  LOCATION:  [id number]                         FACILITY:  [facility]  PHYSICIAN:  Lind Guest. Ninfa Linden, M.D.DATE OF BIRTH:  1927-12-11  DATE OF ADMISSION:  06/04/2011 DATE OF DISCHARGE:                              DISCHARGE SUMMARY   ADMITTING DIAGNOSES:  Right hip intertrochanteric and subtrochanteric femur fracture.  SECONDARY DIAGNOSES: 1. Status post bilateral knee replacement. 2. Status post right shoulder replacement. 3. Hypertension. 4. Hypercholesterolemia. 5. History of transient ischemic attack in the past.  DISCHARGE DIAGNOSES: 1. Status post open reduction internal fixation of right hip fracture. 2. New-onset atrial fibrillation.  HOSPITAL COURSE:  [name] is an very pleasant 77 year old female who is community ambulated and very active.  She sustained a mechanical fall the evening prior to admission onto her right hip.  She was transported to the emergency room and found to have an intertrochanteric hip fracture.  It was recommended she undergo open reduction internal fixation of this fracture.  The evening of surgery, she was in the holding room and she did go into atrial fibrillation with a rapid ventricular response.  The Cardiology consult was called with [facility] cardiologist and they came and assessed [name].  They were able to eventually get her rate controlled and she converted back in a sinus rhythm, so we took her to the operating room that evening, where she underwent open reduction internal fixation of this complex hip fracture. She was transported to the telemetry floor and on the telemetry floor, we have her 50% weightbearing.  She did require a transfusion secondary to acute blood loss anemia as well.  She was also started on Coumadin for DVT prophylaxis and lies the fact that she has this new onset of atrial fibrillation.  The remainder of her hospitalization  was uncomplicated, but it was felt that she would benefit from a skilled nursing placement.  She has been a candidate in place before and she is aware of the rehab potential high places such as this.  DISPOSITION:  Discharged to short-term skilled nursing facility.  DISCHARGE INSTRUCTIONS:  While she is at the short-term skilled nursing facility, she cannot get her incision on a right hip wet in the shower and dry dressing placed daily.  She will remain 50% weightbearing only on her right lower extremity as well.  A followup appointment which should be established with my office in 2 weeks from discharge from the hospital here as well as followup appointment with [facility] Cardiology.  DISCHARGE MEDICATIONS: 1. Norco 5/325 one to two p.o. q.4-6 h. p.r.n. pain. 2. Robaxin 500 mg p.o. q.6 h. p.r.n. spasms. 3. Coumadin dose daily for a target INR of 2-3. 4. Colace 100 mg p.o. b.i.d. p.r.n. 5. Lisinopril 10 mg p.o. daily. 6. Toprol-XL 25 mg p.o. daily. 7. Zocor 40 mg p.o. daily at 1800 hours.     Lind Guest. Ninfa Linden, M.D.     CYB/MEDQ  D:  06/06/2011  T:  06/06/2011  Job:  [id number]  Electronically Signed by Jean Rosenthal M.D. on 06/11/2011 11:42:00 PM

## 2011-06-11 NOTE — Op Note (Signed)
NAMESUMEDHA, MATUSIK NO.:  [id number]  MEDICAL RECORD NO.:  [id number]  LOCATION:  [id number]                         FACILITY:  [place]  PHYSICIAN:  Lind Guest. Ninfa Linden, M.D.DATE OF BIRTH:  1928/01/11  DATE OF PROCEDURE:  06/04/2011 DATE OF DISCHARGE:                              OPERATIVE REPORT   PREOPERATIVE DIAGNOSIS:  Right unstable intertrochanteric femur fracture.  POSTOPERATIVE DIAGNOSIS:  Right unstable intertrochanteric femur/hip fracture with subtrochanteric component.  PROCEDURE:  Open reduction and internal fixation using intramedullary nail and 2 distal screws.  IMPLANTS:  [employer]hew 10 x 36 intramedullary nail with 95-mm lag screw and 2 distal locking screws.  SURGEON:  Lind Guest. Ninfa Linden, MD  ANESTHESIA:  General.  ESTIMATED BLOOD LOSS:  150 mL.  ANTIBIOTICS:  One g IV Ancef.  COMPLICATIONS:  None.  INDICATIONS:  Ms. [name] is an 77 year old female who had an mechanical fall last evening.  She sustained a complex intertrochanteric femur fracture and subtrochanteric component.  She is now presenting for an operative fixation of this fracture.  In the holding room, she did develop atrial fibrillation with rapid ventricular response, and this necessitated a Cardiology consult.  Her rate was controlled, and we proceed with surgery.  The risks and benefits of this had been explained to her and her family, and they did wish to proceed with surgery.  PROCEDURE DESCRIPTION:  After informed consent was obtained, appropriate right hip was marked.  She was brought to the operating room, placed supine on the fracture table.  General anesthesia was then obtained. Her right leg was placed in-line skeletal traction and peroneal post was placed, and left hip was flexed and abducted out of the way with a abduction stirrup and appropriate padding.  Under direct fluoroscopic guidance, we tried with difficulty to reduce the fracture, which  showed significant comminution of the greater trochanter.  We then had the hip prepped and draped with DuraPrep and sterile drapes including A sterile stockinette.  A time-out was called to identify the correct patient and the correct right hip.  I then made my incision just proximal to the greater trochanter ended up being slightly anterior with this incision. This made my start site difficult as well as the nature of the fracture. I eventually was able to put guidepin in antegrade fashion and then using initiating reamer to open up femoral canal.  I then was able to make a measurement which was 10 x 36 femoral nail.  I placed this down the leg in antegrade fashion.  This was tight and I did not ream.  It did cause some extension over subtrochanteric fracture.  Once I got the rod down, I made a separate often using the outrigger guide and also __________ , I was able to place the guidepin through the lateral cortex traversing the fracture and into the head region.  I then made a measurement and drilled for a 95-mm lag screw.  We placed the lag screw and we let the traction off and placed this under compression with compression component.  Next, I made separate distal incisions for placement of 2 interlocks from a lateral to  medial direction.  I then copiously irrigated all wounds and closed the deep tissue with 0 Vicryl followed by 2-0 Vicryl and staples on all incision.  A well-padded sterile dressing was applied.  The patient was awakened and extubated and taken to recovery room in stable condition.  I used fluoroscopy during the case to help with reduction.  Postoperatively, she will be transferred to a telemetry bed with cardiologist still following.  We will initiate hopefully physical therapy tomorrow with about 50% weightbearing on her hip.     Lind Guest. Ninfa Linden, M.D.     CYB/MEDQ  D:  06/04/2011  T:  06/05/2011  Job:  [id number]  Electronically Signed by Jean Rosenthal M.D. on 06/11/2011 11:41:57 PM

## 2011-06-12 NOTE — Consult Note (Signed)
  NAMEVIRGIL, [name] NO.:  [id number]  MEDICAL RECORD NO.:  [id number]  LOCATION:  [id number]                         FACILITY:  [facility]  PHYSICIAN:  Champ Mungo. Lovena Le, MD    DATE OF BIRTH:  1928/08/30  DATE OF CONSULTATION:  06/04/2011 DATE OF DISCHARGE:                                CONSULTATION   Consultation is requested by the anesthesiologist.  INDICATION FOR CONSULTATION:  Evaluation of atrial fibrillation with rapid ventricular response.  HISTORY OF PRESENT ILLNESS:  The patient is a very pleasant 77 year old woman with multiple orthopedic procedures in the past.  She has never been diagnosed with atrial fibrillation.  She fell earlier yesterday and fractured her hip.  When she presented to the hospital, she was in AFib with a controlled ventricular response, but throughout the day, her heart rate has gradually increased.  She complains of pain at the fracture site.  The patient prior to her hip fracture denies chest pain, shortness of breath, or any limitations to her activity.  She remains a very functional 77 year old.  She has had no syncope.  PAST MEDICAL HISTORY:  Notable for some hypertension.  She has a history of palpitations.  She is followed by Dr. Felipa Eth.  SOCIAL HISTORY:  The patient denies tobacco or ethanol abuse.  REVIEW OF SYSTEMS:  Negative except as noted in the HPI.  FAMILY HISTORY:  Negative for premature coronary artery disease.  PHYSICAL EXAMINATION:  GENERAL:  She is a pleasant elderly woman who is in no acute distress. VITAL SIGNS:  Blood pressure was 125/70, the pulse was 120 and irregular, the respirations were 20, temperature is 98. HEENT:  Normocephalic and atraumatic.  Pupils are equal and round. Oropharynx is moist.  Sclerae anicteric. NECK:  No jugular venous distention. LUNGS:  Clear bilaterally to auscultation.  No wheezes, rales, or rhonchi are present. CARDIOVASCULAR:  Irregularly irregular tachycardia with  normal S1 and S2.  The PMI was not enlarged or laterally displaced. ABDOMEN:  Soft, nontender.  There is no organomegaly. EXTREMITIES:  No cyanosis, clubbing, or edema.  The right hip area was quite tender. NEUROLOGIC:  Alert and oriented x3.  Cranial nerves intact.  Strength is 5/5 and symmetric.  EKG demonstrates atrial fibrillation with rapid ventricular response.  IMPRESSION: 1. Atrial fibrillation with a rapid ventricular response. 2. Hip fracture for pending repair.  DISCUSSION:  We will plan on giving the patient intravenous digoxin and metoprolol to see if we can control ventricular rate.  I think if we can get her rate down in the 120 range or better, she should be safe to go ahead and proceed with surgery.  If during surgery her rates increase, then additional IV digoxin and/or Lopressor could certainly be used as needed and if necessary IV fluid boluses used to help keep her blood pressure controlled as needed.     Champ Mungo. Lovena Le, MD     GWT/MEDQ  D:  06/04/2011  T:  06/05/2011  Job:  [id number]  cc:   Hal T. Stoneking, M.D.  Electronically Signed by Cristopher Peru MD on 06/12/2011 09:55:09 AM

## 2011-06-15 ENCOUNTER — Encounter: Payer: Self-pay | Admitting: Internal Medicine

## 2011-06-16 ENCOUNTER — Telehealth: Payer: Self-pay | Admitting: Internal Medicine

## 2011-06-16 NOTE — Telephone Encounter (Signed)
Called patient back and discussed side effects with her  Dr Lovena Le highly recommends she continue the medications and they can discuss further at her appt on 07/10/2011

## 2011-06-16 NOTE — Telephone Encounter (Signed)
Pt's son called and pt is concerned regarding side effects of medication amiodarone.  She was started on this while in the hospital.  Please call her back on her cell phone [phone].  Please call soon as patient is very concerned.

## 2011-06-16 NOTE — Telephone Encounter (Signed)
Pt said she was returning [facility] call per pt son. Claiborne Billings said she did not speak w/ pt. Please return pt call.

## 2011-06-26 ENCOUNTER — Encounter: Payer: Self-pay | Admitting: Internal Medicine

## 2011-06-29 ENCOUNTER — Encounter: Payer: Self-pay | Admitting: Internal Medicine

## 2011-07-09 ENCOUNTER — Encounter: Payer: Self-pay | Admitting: Internal Medicine

## 2011-07-10 ENCOUNTER — Encounter: Payer: Self-pay | Admitting: Internal Medicine

## 2011-07-10 ENCOUNTER — Ambulatory Visit (INDEPENDENT_AMBULATORY_CARE_PROVIDER_SITE_OTHER): Payer: Medicare Other | Admitting: Internal Medicine

## 2011-07-10 VITALS — BP 120/51 | HR 55 | Ht 60.0 in | Wt 119.0 lb

## 2011-07-10 DIAGNOSIS — E785 Hyperlipidemia, unspecified: Secondary | ICD-10-CM

## 2011-07-10 DIAGNOSIS — I4891 Unspecified atrial fibrillation: Secondary | ICD-10-CM

## 2011-07-10 DIAGNOSIS — I10 Essential (primary) hypertension: Secondary | ICD-10-CM

## 2011-07-10 MED ORDER — METOPROLOL SUCCINATE ER 25 MG PO TB24: ORAL_TABLET | ORAL | Status: DC

## 2011-07-10 NOTE — Assessment & Plan Note (Signed)
She will continue on her statin therapy. We'll plan to follow her liver functions. A low-fat diet is recommended.

## 2011-07-10 NOTE — Progress Notes (Signed)
HPI [name] returns today for followup. She is an 77 year old woman who recently fractured her hip. She has a history of paroxysmal atrial fibrillation and when she had broken her hip, she was in atrial fibrillation with a rapid ventricular response at 160 beats per minute. Initially she was treated with rate control and underwent successful hip repair. Her postoperative course was for the most part uneventful. She was started on amiodarone and is maintaining sinus rhythm. She is anxious about the side effects of amiodarone. No palpitation, c/p or sob. Allergies  Allergen Reactions  . Codeine      Current Outpatient Prescriptions  Medication Sig Dispense Refill  . amiodarone (PACERONE) 200 MG tablet Take 200 mg by mouth daily.        . Cholecalciferol (VITAMIN D3) 5000 UNITS CAPS Take by mouth.        . docusate sodium (COLACE) 50 MG capsule Take by mouth 2 (two) times daily.        Marland Kitchen donepezil (ARICEPT) 10 MG tablet Take 10 mg by mouth at bedtime.        Marland Kitchen HYDROcodone-acetaminophen (NORCO) 5-325 MG per tablet Take 1 tablet by mouth every 6 (six) hours as needed.        Marland Kitchen lisinopril (PRINIVIL,ZESTRIL) 10 MG tablet Take 10 mg by mouth daily.        . methocarbamol (ROBAXIN) 500 MG tablet Take 500 mg by mouth 4 (four) times daily.        . metoprolol succinate (TOPROL-XL) 25 MG 24 hr tablet 1/2 po qhs      . omega-3 acid ethyl esters (LOVAZA) 1 G capsule Take 2 g by mouth daily.        . simvastatin (ZOCOR) 40 MG tablet Take 40 mg by mouth at bedtime.        . traMADol (ULTRAM) 50 MG tablet Take 50 mg by mouth every 6 (six) hours as needed.        . vitamin C (ASCORBIC ACID) 500 MG tablet Take 500 mg by mouth daily.        . Warfarin Sodium (COUMADIN PO) Take by mouth as directed.           Past Medical History  Diagnosis Date  . Hypertension   . Hypercholesteremia   . Coronary artery disease     ROS:   All systems reviewed and negative except as noted in the HPI.   Past  Surgical History  Procedure Date  . Replacement total knee bilateral   . Right shoulder replacement      No family history on file.   History   Social History  . Marital Status: Widowed    Spouse Name: N/A    Number of Children: N/A  . Years of Education: N/A   Occupational History  . Not on file.   Social History Main Topics  . Smoking status: Never Smoker   . Smokeless tobacco: Not on file  . Alcohol Use: Not on file  . Drug Use: Not on file  . Sexually Active: Not on file   Other Topics Concern  . Not on file   Social History Narrative  . No narrative on file     BP 120/51  Pulse 55  Ht 5' (1.524 m)  Wt 119 lb (53.978 kg)  BMI 23.24 kg/m2  Physical Exam:  Totally but Well appearing NAD HEENT: Unremarkable Neck:  No JVD, no thyromegally Lymphatics:  No adenopathy Back:  No CVA tenderness  Lungs:  Clear. No wheezes, rales, or rhonchi. HEART:  Regular rate rhythm, no murmurs, no rubs, no clicks Abd:  soft, positive bowel sounds, no organomegally, no rebound, no guarding Ext:  2 plus pulses, no edema, no cyanosis, no clubbing Skin:  No rashes no nodules Neuro:  CN II through XII intact, motor grossly intact  EKG Normal sinus rhythm.  Assess/Plan:

## 2011-07-10 NOTE — Assessment & Plan Note (Signed)
Her blood pressure appears to be well-controlled. She will continue her current medical therapy and maintain a low-sodium diet. I will reduce her beta blocker dose.

## 2011-07-10 NOTE — Assessment & Plan Note (Signed)
She is maintaining NSR. I plan to continue amiodarone for now at low dose. Will reduce her beta blocker.

## 2011-07-10 NOTE — Patient Instructions (Addendum)
Your physician recommends that you schedule a follow-up appointment in: 4-6 weeks with Dr Lovena Le  Your physician has recommended you make the following change in your medication: decrease Toprol dose to 1/2 tablet in the pm

## 2011-07-16 ENCOUNTER — Ambulatory Visit (HOSPITAL_COMMUNITY): Payer: Medicare Other | Attending: Internal Medicine

## 2011-07-21 ENCOUNTER — Ambulatory Visit (INDEPENDENT_AMBULATORY_CARE_PROVIDER_SITE_OTHER): Payer: Self-pay | Admitting: Cardiology

## 2011-07-21 DIAGNOSIS — R0989 Other specified symptoms and signs involving the circulatory and respiratory systems: Secondary | ICD-10-CM

## 2011-07-21 DIAGNOSIS — Z7901 Long term (current) use of anticoagulants: Secondary | ICD-10-CM | POA: Insufficient documentation

## 2011-07-21 DIAGNOSIS — I4891 Unspecified atrial fibrillation: Secondary | ICD-10-CM

## 2011-07-21 LAB — POCT INR: INR: 2.9

## 2011-07-24 ENCOUNTER — Ambulatory Visit (INDEPENDENT_AMBULATORY_CARE_PROVIDER_SITE_OTHER): Payer: Self-pay | Admitting: Cardiovascular Disease

## 2011-07-24 DIAGNOSIS — R0989 Other specified symptoms and signs involving the circulatory and respiratory systems: Secondary | ICD-10-CM

## 2011-07-25 ENCOUNTER — Other Ambulatory Visit: Payer: Self-pay | Admitting: Internal Medicine

## 2011-07-27 ENCOUNTER — Other Ambulatory Visit: Payer: Self-pay | Admitting: Internal Medicine

## 2011-07-27 DIAGNOSIS — I4891 Unspecified atrial fibrillation: Secondary | ICD-10-CM

## 2011-07-27 NOTE — Telephone Encounter (Signed)
They have medication question pt is having tremmers and want to cut amiodarone in half

## 2011-07-28 NOTE — Telephone Encounter (Signed)
Left message for [facility] to return my call

## 2011-07-28 NOTE — Telephone Encounter (Signed)
Okay to cut dose in half and lets see how tremors get  [name] will follow up with me regards to any onr no improvement

## 2011-07-28 NOTE — Telephone Encounter (Signed)
Ok

## 2011-07-28 NOTE — Telephone Encounter (Signed)
Addended by: Janan Halter F on: 07/28/2011 12:12 PM   Modules accepted: Orders

## 2011-07-29 NOTE — Telephone Encounter (Signed)
Addended by: Janan Halter F on: 07/29/2011 05:54 PM   Modules accepted: Orders

## 2011-07-31 ENCOUNTER — Ambulatory Visit (INDEPENDENT_AMBULATORY_CARE_PROVIDER_SITE_OTHER): Payer: Self-pay | Admitting: Internal Medicine

## 2011-07-31 LAB — POCT INR: INR: 3.3

## 2011-08-06 ENCOUNTER — Telehealth: Payer: Self-pay | Admitting: Internal Medicine

## 2011-08-06 NOTE — Telephone Encounter (Signed)
Will forward to Dr Lovena Le for [facility]

## 2011-08-06 NOTE — Telephone Encounter (Signed)
[name] calling to let dr taylor know for appt tomorrow that pt experiencing flutters, nausea,tremors,flushing in face at night,no energy and being anxious since starting amiodarone

## 2011-08-07 ENCOUNTER — Ambulatory Visit (INDEPENDENT_AMBULATORY_CARE_PROVIDER_SITE_OTHER): Payer: Medicare Other | Admitting: Internal Medicine

## 2011-08-07 ENCOUNTER — Encounter: Payer: Self-pay | Admitting: Internal Medicine

## 2011-08-07 DIAGNOSIS — I10 Essential (primary) hypertension: Secondary | ICD-10-CM

## 2011-08-07 DIAGNOSIS — I4891 Unspecified atrial fibrillation: Secondary | ICD-10-CM

## 2011-08-07 MED ORDER — METOPROLOL SUCCINATE ER 25 MG PO TB24: 25.0000 mg | ORAL_TABLET | Freq: Every day | ORAL | Status: DC

## 2011-08-07 NOTE — Progress Notes (Signed)
HPI [name] returns today for followup. She is a pleasant 77 year old woman who sustained a hip fracture several months ago. I became involved in her care at that time because of atrial fibrillation with a rapid ventricular response with rates of over 150 beats a minute. She was initially placed on intravenous beta blockers and then ultimately on amiodarone. She initially did well maintain sinus rhythm. The last several weeks, the patient has developed problems with nausea and tremor on amiodarone. Her appetite is reduced. She returns today to consider additional treatment options. The patient has not had frank syncope. She has very little in the way of palpitations since starting amiodarone. No syncope. Allergies  Allergen Reactions  . Codeine      Current Outpatient Prescriptions  Medication Sig Dispense Refill  . Cholecalciferol (VITAMIN D3) 5000 UNITS CAPS Take by mouth.        . docusate sodium (COLACE) 50 MG capsule Take by mouth 2 (two) times daily.        Marland Kitchen donepezil (ARICEPT) 10 MG tablet Take 10 mg by mouth at bedtime.        Marland Kitchen HYDROcodone-acetaminophen (NORCO) 5-325 MG per tablet Take 1 tablet by mouth every 6 (six) hours as needed.        Marland Kitchen lisinopril (PRINIVIL,ZESTRIL) 10 MG tablet Take 10 mg by mouth daily.        . methocarbamol (ROBAXIN) 500 MG tablet Take 500 mg by mouth 4 (four) times daily.        . metoprolol succinate (TOPROL-XL) 25 MG 24 hr tablet Take 1 tablet (25 mg total) by mouth daily. 1/2 po qhs      . omega-3 acid ethyl esters (LOVAZA) 1 G capsule Take 2 g by mouth daily.        . simvastatin (ZOCOR) 40 MG tablet Take 40 mg by mouth at bedtime.        . traMADol (ULTRAM) 50 MG tablet Take 50 mg by mouth every 6 (six) hours as needed.        . vitamin C (ASCORBIC ACID) 500 MG tablet Take 500 mg by mouth daily.        Marland Kitchen warfarin (COUMADIN) 3 MG tablet Take as directed by anticoagulation clinic  45 tablet  3  . Warfarin Sodium (COUMADIN PO) Take by mouth as directed.            Past Medical History  Diagnosis Date  . Hypertension   . Hypercholesteremia   . Coronary artery disease     ROS:   All systems reviewed and negative except as noted in the HPI.   Past Surgical History  Procedure Date  . Replacement total knee bilateral   . Right shoulder replacement      No family history on file.   History   Social History  . Marital Status: Widowed    Spouse Name: N/A    Number of Children: N/A  . Years of Education: N/A   Occupational History  . Not on file.   Social History Main Topics  . Smoking status: Never Smoker   . Smokeless tobacco: Not on file  . Alcohol Use: Not on file  . Drug Use: Not on file  . Sexually Active: Not on file   Other Topics Concern  . Not on file   Social History Narrative  . No narrative on file     BP 144/62  Pulse 72  Ht 4\' 11"  (1.499 m)  Wt 117 lb  12.8 oz (53.434 kg)  BMI 23.79 kg/m2  Physical Exam:  Well appearing elderly woman, NAD HEENT: Unremarkable Neck:  No JVD, no thyromegally Lymphatics:  No adenopathy Back:  No CVA tenderness Lungs:  Clear with no wheezes, rales, or rhonchi. HEART:  Regular rate rhythm, no murmurs, no rubs, no clicks Abd:  soft, positive bowel sounds, no organomegally, no rebound, no guarding Ext:  2 plus pulses, no edema, no cyanosis, no clubbing Skin:  No rashes no nodules Neuro:  CN II through XII intact, motor grossly intact    Assess/Plan:

## 2011-08-07 NOTE — Assessment & Plan Note (Signed)
The patient has become intolerant to amiodarone. I suspect her symptoms are related to the use of this medication. I recommended shock amiodarone. I plan to see her back in several weeks and decide as to whether or not she will need additional antiarrhythmic therapy.

## 2011-08-07 NOTE — Patient Instructions (Signed)
Your physician recommends that you schedule a follow-up appointment in: [place] physician has recommended you make the following change in your medication: STOP AMIODARONE AND INCREASE  METOPROLOL  TO 1 TAB EVERY DAY

## 2011-08-07 NOTE — Assessment & Plan Note (Signed)
Blood pressure today is slightly elevated. I reinforced the importance of maintaining a low-sodium diet. In addition she will up titrate her metoprolol for better blood pressure control as well as rate control if the patient goes on to develop atrial fibrillation and worsening hypertension.

## 2011-08-14 LAB — PROTIME-INR: INR: 2.4 — AB (ref <=1.1)

## 2011-08-15 ENCOUNTER — Inpatient Hospital Stay (HOSPITAL_COMMUNITY)
Admission: EM | Admit: 2011-08-15 | Discharge: 2011-08-18 | DRG: 536 | Disposition: A | Discharge status: Rehabilitation Facility | Payer: Medicare Other | Attending: Internal Medicine | Admitting: Internal Medicine

## 2011-08-15 ENCOUNTER — Emergency Department (HOSPITAL_COMMUNITY): Payer: Medicare Other

## 2011-08-15 DIAGNOSIS — I10 Essential (primary) hypertension: Secondary | ICD-10-CM | POA: Diagnosis present

## 2011-08-15 DIAGNOSIS — M810 Age-related osteoporosis without current pathological fracture: Secondary | ICD-10-CM | POA: Diagnosis present

## 2011-08-15 DIAGNOSIS — S32509A Unspecified fracture of unspecified pubis, initial encounter for closed fracture: Principal | ICD-10-CM | POA: Diagnosis present

## 2011-08-15 DIAGNOSIS — B952 Enterococcus as the cause of diseases classified elsewhere: Secondary | ICD-10-CM | POA: Diagnosis not present

## 2011-08-15 DIAGNOSIS — Y92009 Unspecified place in unspecified non-institutional (private) residence as the place of occurrence of the external cause: Secondary | ICD-10-CM

## 2011-08-15 DIAGNOSIS — E785 Hyperlipidemia, unspecified: Secondary | ICD-10-CM | POA: Diagnosis present

## 2011-08-15 DIAGNOSIS — Z8673 Personal history of transient ischemic attack (TIA), and cerebral infarction without residual deficits: Secondary | ICD-10-CM

## 2011-08-15 DIAGNOSIS — W19XXXA Unspecified fall, initial encounter: Secondary | ICD-10-CM | POA: Diagnosis present

## 2011-08-15 DIAGNOSIS — R55 Syncope and collapse: Secondary | ICD-10-CM | POA: Diagnosis present

## 2011-08-15 DIAGNOSIS — Z96619 Presence of unspecified artificial shoulder joint: Secondary | ICD-10-CM

## 2011-08-15 DIAGNOSIS — Z96659 Presence of unspecified artificial knee joint: Secondary | ICD-10-CM

## 2011-08-15 DIAGNOSIS — I4891 Unspecified atrial fibrillation: Secondary | ICD-10-CM | POA: Diagnosis present

## 2011-08-15 DIAGNOSIS — N390 Urinary tract infection, site not specified: Secondary | ICD-10-CM | POA: Diagnosis not present

## 2011-08-15 DIAGNOSIS — Z66 Do not resuscitate: Secondary | ICD-10-CM | POA: Diagnosis present

## 2011-08-15 DIAGNOSIS — Z7901 Long term (current) use of anticoagulants: Secondary | ICD-10-CM

## 2011-08-15 DIAGNOSIS — Z79899 Other long term (current) drug therapy: Secondary | ICD-10-CM

## 2011-08-15 DIAGNOSIS — F0390 Unspecified dementia without behavioral disturbance: Secondary | ICD-10-CM | POA: Diagnosis present

## 2011-08-15 LAB — DIFFERENTIAL
Basophils Absolute: 0 10*3/uL (ref 0.0–0.1)
Basophils Relative: 0 % (ref 0–1)
Eosinophils Absolute: 0.3 10*3/uL (ref 0.0–0.7)
Eosinophils Relative: 3 % (ref 0–5)
Lymphocytes Relative: 11 % — ABNORMAL LOW (ref 12–46)
Lymphs Abs: 0.8 10*3/uL (ref 0.7–4.0)
Monocytes Absolute: 0.7 10*3/uL (ref 0.1–1.0)
Monocytes Relative: 9 % (ref 3–12)
Neutro Abs: 6 10*3/uL (ref 1.7–7.7)
Neutrophils Relative %: 77 % (ref 43–77)

## 2011-08-15 LAB — BASIC METABOLIC PANEL
BUN: 14 mg/dL (ref 6–23)
CO2: 27 mEq/L (ref 19–32)
Calcium: 9.5 mg/dL (ref 8.4–10.5)
Chloride: 99 mEq/L (ref 96–112)
Creatinine, Ser: 0.52 mg/dL (ref 0.50–1.10)
GFR calc Af Amer: >90 mL/min (ref >90)
GFR calc non Af Amer: 87 mL/min — ABNORMAL LOW (ref >90)
Glucose, Bld: 102 mg/dL — ABNORMAL HIGH (ref 70–99)
Potassium: 3.8 mEq/L (ref 3.5–5.1)
Sodium: 136 mEq/L (ref 135–145)

## 2011-08-15 LAB — PROTIME-INR
INR: 2.46 — ABNORMAL HIGH (ref 0.00–1.49)
Prothrombin Time: 27.1 seconds — ABNORMAL HIGH (ref 11.6–15.2)

## 2011-08-15 LAB — CBC
HCT: 32.6 % — ABNORMAL LOW (ref 36.0–46.0)
Hemoglobin: 10.9 g/dL — ABNORMAL LOW (ref 12.0–15.0)
MCH: 29.5 pg (ref 26.0–34.0)
MCHC: 33.4 g/dL (ref 30.0–36.0)
MCV: 88.1 fL (ref 78.0–100.0)
Platelets: 232 10*3/uL (ref 150–400)
RBC: 3.7 MIL/uL — ABNORMAL LOW (ref 3.87–5.11)
RDW: 14.9 % (ref 11.5–15.5)
WBC: 7.8 10*3/uL (ref 4.0–10.5)

## 2011-08-15 LAB — TSH: TSH: 0.77 u[IU]/mL (ref 0.350–4.500)

## 2011-08-15 LAB — CARDIAC PANEL(CRET KIN+CKTOT+MB+TROPI): Total CK: 116 U/L (ref 7–177)

## 2011-08-15 LAB — POCT I-STAT TROPONIN I: Troponin i, poc: 0 ng/mL (ref 0.00–0.08)

## 2011-08-15 LAB — CK TOTAL AND CKMB (NOT AT ARMC)
CK, MB: 4.1 ng/mL — ABNORMAL HIGH (ref 0.3–4.0)
Total CK: 122 U/L (ref 7–177)

## 2011-08-16 ENCOUNTER — Inpatient Hospital Stay (HOSPITAL_COMMUNITY): Payer: Medicare Other

## 2011-08-16 DIAGNOSIS — I517 Cardiomegaly: Secondary | ICD-10-CM

## 2011-08-16 LAB — URINALYSIS, ROUTINE W REFLEX MICROSCOPIC
Bilirubin Urine: NEGATIVE
Glucose, UA: NEGATIVE mg/dL
Ketones, ur: 15 mg/dL — AB
Nitrite: POSITIVE — AB
Protein, ur: NEGATIVE mg/dL
Specific Gravity, Urine: 1.018 (ref 1.005–1.030)
Urobilinogen, UA: 0.2 mg/dL (ref 0.0–1.0)
pH: 5.5 (ref 5.0–8.0)

## 2011-08-16 LAB — PROTIME-INR
INR: 2.46 — ABNORMAL HIGH (ref 0.00–1.49)
Prothrombin Time: 27.1 seconds — ABNORMAL HIGH (ref 11.6–15.2)

## 2011-08-16 LAB — CBC
MCH: 29.3 pg (ref 26.0–34.0)
MCHC: 32.9 g/dL (ref 30.0–36.0)
MCV: 89 fL (ref 78.0–100.0)
Platelets: 201 10*3/uL (ref 150–400)
RDW: 15.1 % (ref 11.5–15.5)
WBC: 6 10*3/uL (ref 4.0–10.5)

## 2011-08-16 LAB — BASIC METABOLIC PANEL
BUN: 19 mg/dL (ref 6–23)
CO2: 29 mEq/L (ref 19–32)
Chloride: 101 mEq/L (ref 96–112)
Creatinine, Ser: 0.63 mg/dL (ref 0.50–1.10)
Glucose, Bld: 117 mg/dL — ABNORMAL HIGH (ref 70–99)

## 2011-08-16 LAB — CARDIAC PANEL(CRET KIN+CKTOT+MB+TROPI)
CK, MB: 3.2 ng/mL (ref 0.3–4.0)
Relative Index: INVALID (ref 0.0–2.5)
Total CK: 93 U/L (ref 7–177)
Troponin I: <0.3 ng/mL (ref <0.30)

## 2011-08-16 LAB — URINE MICROSCOPIC-ADD ON

## 2011-08-16 LAB — D-DIMER, QUANTITATIVE: D-Dimer, Quant: 3.8 ug/mL-FEU — ABNORMAL HIGH (ref 0.00–0.48)

## 2011-08-16 MED ORDER — IOHEXOL 300 MG/ML  SOLN
100.0000 mL | Freq: Once | INTRAMUSCULAR | Status: AC | PRN
  Administered 2011-08-16: 100 mL via INTRAVENOUS

## 2011-08-17 ENCOUNTER — Ambulatory Visit (INDEPENDENT_AMBULATORY_CARE_PROVIDER_SITE_OTHER): Payer: Self-pay | Admitting: Cardiology

## 2011-08-17 DIAGNOSIS — S72143A Displaced intertrochanteric fracture of unspecified femur, initial encounter for closed fracture: Secondary | ICD-10-CM

## 2011-08-17 DIAGNOSIS — R0989 Other specified symptoms and signs involving the circulatory and respiratory systems: Secondary | ICD-10-CM

## 2011-08-17 DIAGNOSIS — S329XXA Fracture of unspecified parts of lumbosacral spine and pelvis, initial encounter for closed fracture: Secondary | ICD-10-CM

## 2011-08-17 LAB — BASIC METABOLIC PANEL
CO2: 29 mEq/L (ref 19–32)
GFR calc non Af Amer: 87 mL/min — ABNORMAL LOW (ref >90)
Glucose, Bld: 99 mg/dL (ref 70–99)
Potassium: 4 mEq/L (ref 3.5–5.1)
Sodium: 138 mEq/L (ref 135–145)

## 2011-08-17 LAB — CBC
HCT: 30.1 % — ABNORMAL LOW (ref 36.0–46.0)
MCV: 88.8 fL (ref 78.0–100.0)
RBC: 3.39 MIL/uL — ABNORMAL LOW (ref 3.87–5.11)
WBC: 5.7 10*3/uL (ref 4.0–10.5)

## 2011-08-17 LAB — PROTIME-INR: INR: 2.62 — ABNORMAL HIGH (ref 0.00–1.49)

## 2011-08-18 ENCOUNTER — Inpatient Hospital Stay (HOSPITAL_COMMUNITY)
Admission: RE | Admit: 2011-08-18 | Discharge: 2011-08-28 | DRG: 945 | Disposition: A | Discharge status: Home Health | Payer: Medicare Other | Source: Other Acute Inpatient Hospital | Attending: Physical Medicine & Rehabilitation | Admitting: Physical Medicine & Rehabilitation

## 2011-08-18 DIAGNOSIS — Z8673 Personal history of transient ischemic attack (TIA), and cerebral infarction without residual deficits: Secondary | ICD-10-CM

## 2011-08-18 DIAGNOSIS — Z96659 Presence of unspecified artificial knee joint: Secondary | ICD-10-CM

## 2011-08-18 DIAGNOSIS — S32509A Unspecified fracture of unspecified pubis, initial encounter for closed fracture: Secondary | ICD-10-CM

## 2011-08-18 DIAGNOSIS — I4891 Unspecified atrial fibrillation: Secondary | ICD-10-CM | POA: Diagnosis present

## 2011-08-18 DIAGNOSIS — R32 Unspecified urinary incontinence: Secondary | ICD-10-CM | POA: Diagnosis present

## 2011-08-18 DIAGNOSIS — Y92009 Unspecified place in unspecified non-institutional (private) residence as the place of occurrence of the external cause: Secondary | ICD-10-CM

## 2011-08-18 DIAGNOSIS — F0390 Unspecified dementia without behavioral disturbance: Secondary | ICD-10-CM | POA: Diagnosis present

## 2011-08-18 DIAGNOSIS — G4700 Insomnia, unspecified: Secondary | ICD-10-CM | POA: Diagnosis present

## 2011-08-18 DIAGNOSIS — I10 Essential (primary) hypertension: Secondary | ICD-10-CM | POA: Diagnosis present

## 2011-08-18 DIAGNOSIS — M129 Arthropathy, unspecified: Secondary | ICD-10-CM | POA: Diagnosis present

## 2011-08-18 DIAGNOSIS — E785 Hyperlipidemia, unspecified: Secondary | ICD-10-CM | POA: Diagnosis present

## 2011-08-18 DIAGNOSIS — N390 Urinary tract infection, site not specified: Secondary | ICD-10-CM

## 2011-08-18 DIAGNOSIS — R55 Syncope and collapse: Secondary | ICD-10-CM

## 2011-08-18 DIAGNOSIS — Z5189 Encounter for other specified aftercare: Secondary | ICD-10-CM

## 2011-08-18 DIAGNOSIS — M810 Age-related osteoporosis without current pathological fracture: Secondary | ICD-10-CM | POA: Diagnosis present

## 2011-08-18 DIAGNOSIS — W19XXXA Unspecified fall, initial encounter: Secondary | ICD-10-CM | POA: Diagnosis present

## 2011-08-18 DIAGNOSIS — A498 Other bacterial infections of unspecified site: Secondary | ICD-10-CM | POA: Diagnosis not present

## 2011-08-18 DIAGNOSIS — Z7901 Long term (current) use of anticoagulants: Secondary | ICD-10-CM

## 2011-08-18 DIAGNOSIS — S93409A Sprain of unspecified ligament of unspecified ankle, initial encounter: Secondary | ICD-10-CM | POA: Diagnosis present

## 2011-08-18 LAB — CBC
HCT: 28.6 % — ABNORMAL LOW (ref 36.0–46.0)
MCV: 89.1 fL (ref 78.0–100.0)
Platelets: 227 10*3/uL (ref 150–400)
RBC: 3.21 MIL/uL — ABNORMAL LOW (ref 3.87–5.11)
RDW: 15.1 % (ref 11.5–15.5)
WBC: 6.2 10*3/uL (ref 4.0–10.5)

## 2011-08-18 LAB — PROTIME-INR: INR: 2.59 — ABNORMAL HIGH (ref 0.00–1.49)

## 2011-08-19 DIAGNOSIS — I4891 Unspecified atrial fibrillation: Secondary | ICD-10-CM

## 2011-08-19 DIAGNOSIS — R55 Syncope and collapse: Secondary | ICD-10-CM

## 2011-08-19 DIAGNOSIS — N390 Urinary tract infection, site not specified: Secondary | ICD-10-CM

## 2011-08-19 DIAGNOSIS — S32509A Unspecified fracture of unspecified pubis, initial encounter for closed fracture: Secondary | ICD-10-CM

## 2011-08-19 DIAGNOSIS — Z5189 Encounter for other specified aftercare: Secondary | ICD-10-CM

## 2011-08-19 LAB — COMPREHENSIVE METABOLIC PANEL
Albumin: 2.5 g/dL — ABNORMAL LOW (ref 3.5–5.2)
Alkaline Phosphatase: 150 U/L — ABNORMAL HIGH (ref 39–117)
BUN: 19 mg/dL (ref 6–23)
CO2: 28 mEq/L (ref 19–32)
Chloride: 97 mEq/L (ref 96–112)
Creatinine, Ser: 0.58 mg/dL (ref 0.50–1.10)
GFR calc non Af Amer: 84 mL/min — ABNORMAL LOW (ref >90)
Glucose, Bld: 100 mg/dL — ABNORMAL HIGH (ref 70–99)
Potassium: 4 mEq/L (ref 3.5–5.1)
Total Bilirubin: 0.3 mg/dL (ref 0.3–1.2)

## 2011-08-19 LAB — CBC
HCT: 28.3 % — ABNORMAL LOW (ref 36.0–46.0)
Hemoglobin: 9.3 g/dL — ABNORMAL LOW (ref 12.0–15.0)
MCH: 29.1 pg (ref 26.0–34.0)
MCHC: 32.9 g/dL (ref 30.0–36.0)
MCV: 88.4 fL (ref 78.0–100.0)
RBC: 3.2 MIL/uL — ABNORMAL LOW (ref 3.87–5.11)

## 2011-08-19 LAB — DIFFERENTIAL
Basophils Relative: 0 % (ref 0–1)
Lymphs Abs: 1.2 10*3/uL (ref 0.7–4.0)
Monocytes Absolute: 0.7 10*3/uL (ref 0.1–1.0)
Monocytes Relative: 9 % (ref 3–12)
Neutro Abs: 5.6 10*3/uL (ref 1.7–7.7)
Neutrophils Relative %: 72 % (ref 43–77)

## 2011-08-19 LAB — PROTIME-INR: Prothrombin Time: 26.3 seconds — ABNORMAL HIGH (ref 11.6–15.2)

## 2011-08-19 LAB — URINE CULTURE

## 2011-08-19 NOTE — Consult Note (Signed)
  [name], [name] NO.:  [id number]  MEDICAL RECORD NO.:  [id number]  LOCATION:  62                         FACILITY:  [facility]  PHYSICIAN:  Newt Minion, MD     DATE OF BIRTH:  October 10, 1928  DATE OF CONSULTATION:  08/16/2011 DATE OF DISCHARGE:                                CONSULTATION   HISTORY OF PRESENT ILLNESS:  The patient is an 77 year old woman with a history of hypertension, atrial fibrillation, osteoporosis status post IM nailing for right hip and status post total shoulder arthroplasty. The patient states that she is uncertain how she fell, but she states that she did fall without any warning.  She does state that she is currently on a beta-blocker for AFib and is concerned that her AFib may have precipitated her fall.  PAST MEDICAL HISTORY:  Significant for: 1. Hypertension. 2. Osteoporosis. 3. Osteoarthritis. 4. Atrial fibrillation. 5. Hyperlipidemia.  MEDICATIONS:  Coumadin, oxybutynin, metoprolol, simvastatin, tramadol, temazepam, meloxicam, lisinopril.  The patient also states that she takes OxyContin and this works best for her pain, 10 mg tablets.  SOCIAL HISTORY:  The patient lives home alone.  REVIEW OF SYSTEMS:  Negative.  PHYSICAL EXAM:  GENERAL:  The patient is alert, oriented.  No adenopathy. VITAL SIGNS:  Stable. EXTREMITIES:  She has pain with internal and external rotation of her hips.  RADIOGRAPHS:  A fracture of the left inferior and superior pubic rami.  ASSESSMENT: 1. Syncopal event, most likely secondary to her atrial fibrillation. 2. Pubic rami fractures on the left.  PLAN:  The patient does want to return to home, however, with her living home alone it may not be safe for her to return to home unless she is able to independently ambulate.  The patient is quite concerned with her pain level.  She states that she feels like she has 5 days to her hospital stay, and hopefully she can return to home after that  time.  I feel the patient most likely will need short-term skilled nursing.  She was at [facility] previously.  The patient may return to home if she is sage from the ambulation standpoint with a walker.  I will follow up with her in 2 weeks.     Newt Minion, MD     MVD/MEDQ  D:  08/16/2011  T:  08/16/2011  Job:  [id number]  Electronically Signed by Meridee Score MD on 08/19/2011 06:28:37 AM

## 2011-08-21 LAB — PROTIME-INR
INR: 2.51 — ABNORMAL HIGH (ref 0.00–1.49)
Prothrombin Time: 27.5 seconds — ABNORMAL HIGH (ref 11.6–15.2)

## 2011-08-22 DIAGNOSIS — I4891 Unspecified atrial fibrillation: Secondary | ICD-10-CM

## 2011-08-22 DIAGNOSIS — R55 Syncope and collapse: Secondary | ICD-10-CM

## 2011-08-22 DIAGNOSIS — Z5189 Encounter for other specified aftercare: Secondary | ICD-10-CM

## 2011-08-22 DIAGNOSIS — S32509A Unspecified fracture of unspecified pubis, initial encounter for closed fracture: Secondary | ICD-10-CM

## 2011-08-22 DIAGNOSIS — N390 Urinary tract infection, site not specified: Secondary | ICD-10-CM

## 2011-08-22 NOTE — H&P (Signed)
NAMECONSUELLO, UNDERHILL NO.:  [id number]  MEDICAL RECORD NO.:  [id number]  LOCATION:  [facility]                         FACILITY:  [facility]  PHYSICIAN:  Kathie Dike, MD     DATE OF BIRTH:  07/21/1928  DATE OF ADMISSION:  08/15/2011 DATE OF DISCHARGE:                             HISTORY & PHYSICAL   PRIMARY CARE PHYSICIAN:  Hal T. Belspring, MD  CHIEF COMPLAINT:  Fall.  HISTORY OF PRESENT ILLNESS:  This is an 77 year old female with history of hypertension, atrial fibrillation, hyperlipidemia, osteoporosis and has had multiple surgeries on her knees and shoulder.  The patient was at home today when she said she was standing in front of her closet she was picking out what to wear when all of sudden she said she woke up on the floor.  She does not remember actually falling.  She had no presyncopal dizziness chest pain, nausea, shortness of breath, or any other symptoms.  It is unclear as to how long she had passed out for, but she does not remember the actual fall.  She says that she occasionally does feel palpitations at home, but she is on a beta- blocker.  She has not had any fever, cough, nausea, vomiting, diarrhea, abdominal pain, dysuria or any other complaints.  She has no blurry vision, double vision, headache, unilateral weakness or numbness at this point.  On arrival to the emergency room, she was evaluated with serial labs and imaging.  Her x-ray of her femur shows a comminuted fractures of left superior and inferior pubic rami.  The patient has been referred for admission.  PAST MEDICAL HISTORY: 1. Hypertension. 2. Atrial fibrillation. 3. Hyperlipidemia. 4. Osteoporosis.  MEDICATIONS PRIOR TO ADMISSION: 1. Coumadin. 2. Oxybutynin. 3. Metoprolol. 4. Simvastatin. 5. Tramadol. 6. Temazepam. 7. Meloxicam. 8. Lisinopril. 9. Vitamin C. 10.Vitamin B12. 11.Prevacid. 12.Omega-3 over-the-counter. 13.ICaps over the counter. 14.Aricept.  ALLERGIES:   CODEINE causes nausea and vomiting.  SOCIAL HISTORY:  She denies any alcohol or tobacco use.  She ambulates with the help of a walker.  FAMILY HISTORY:  Noncontributory.  REVIEW OF SYSTEMS:  All systems reviewed and pertinent positives stated in the HPI, otherwise negative.  PHYSICAL EXAMINATION:  VITAL SIGNS:  Temperature of 97.7, respiratory rate of 18, heart rate of 61 with a blood pressure 160/68, pulse ox 97%. GENERAL:  The patient is in no acute distress lying in bed. HEENT:  Normocephalic, atraumatic.  Pupils are equal, round, reactive to light. NECK:  Supple. CHEST:  Clear to auscultation bilaterally.  CARDIAC:  Shows S1, S2 with a regular rate and rhythm. ABDOMEN:  Soft, nontender.  Bowel sounds are active. EXTREMITIES:  Show 1+ edema in the lower extremities bilaterally.  LABORATORY DATA:  WBC 7.8, hemoglobin 10.9, platelet count of 232.  INR of 2.46.  Sodium 136, potassium 3.8, chloride 99, bicarb 27, glucose 102, BUN 14, creatinine 0.52, calcium 9.5.  X-ray of the hip shows comminuted fracture of the left superior and inferior pubic rami.  ASSESSMENT AND PLAN: 1. Syncope, etiology is unclear at this point, I think the patient     will need to be monitored on  telemetry to rule out any underlying     arrhythmia.  I did not see an EKG in the chart.  We will check an     EKG at this time.  We will cycle her cardiac markers and also check     a D-dimer.  We will repeat a 2D echocardiogram with contrast.  We     will continue her rate control medicine for atrial fibrillation. 2. Pubic rami fracture.  The ER staff has contacted [place] will be seeing the patient in consultation, likely     surgery will not be indicated.  The patient will be given pain     control as well as physical therapy and occupational therapy. 3. We will continue the remainder of her outpatient medications. 4. Code status, the patient is a DNR, this was confirmed with her.      Further orders per the clinical course.     Kathie Dike, MD     JM/MEDQ  D:  08/15/2011  T:  08/15/2011  Job:  [id number]  cc:   Hal T. Stoneking, M.D.  Electronically Signed by Kathie Dike  on 08/22/2011 08:09:46 PM

## 2011-08-24 ENCOUNTER — Inpatient Hospital Stay (HOSPITAL_COMMUNITY): Payer: Medicare Other

## 2011-08-24 LAB — BASIC METABOLIC PANEL
BUN: 20 mg/dL (ref 6–23)
Creatinine, Ser: 0.6 mg/dL (ref 0.50–1.10)
GFR calc Af Amer: >90 mL/min (ref >90)
GFR calc non Af Amer: 83 mL/min — ABNORMAL LOW (ref >90)
Potassium: 4.8 mEq/L (ref 3.5–5.1)

## 2011-08-24 LAB — CBC
MCH: 29.4 pg (ref 26.0–34.0)
MCV: 88.2 fL (ref 78.0–100.0)
Platelets: 240 10*3/uL (ref 150–400)
RDW: 15.2 % (ref 11.5–15.5)
WBC: 7 10*3/uL (ref 4.0–10.5)

## 2011-08-24 LAB — PROTIME-INR: Prothrombin Time: 21.3 seconds — ABNORMAL HIGH (ref 11.6–15.2)

## 2011-08-25 DIAGNOSIS — R55 Syncope and collapse: Secondary | ICD-10-CM

## 2011-08-25 DIAGNOSIS — Z5189 Encounter for other specified aftercare: Secondary | ICD-10-CM

## 2011-08-25 DIAGNOSIS — S32509A Unspecified fracture of unspecified pubis, initial encounter for closed fracture: Secondary | ICD-10-CM

## 2011-08-25 DIAGNOSIS — N390 Urinary tract infection, site not specified: Secondary | ICD-10-CM

## 2011-08-25 DIAGNOSIS — I4891 Unspecified atrial fibrillation: Secondary | ICD-10-CM

## 2011-08-25 LAB — PROTIME-INR: Prothrombin Time: 21.7 seconds — ABNORMAL HIGH (ref 11.6–15.2)

## 2011-08-27 ENCOUNTER — Inpatient Hospital Stay (HOSPITAL_COMMUNITY): Payer: Medicare Other

## 2011-08-27 ENCOUNTER — Encounter: Payer: Self-pay | Admitting: Internal Medicine

## 2011-08-27 LAB — PROTIME-INR
INR: 1.76 — ABNORMAL HIGH (ref 0.00–1.49)
Prothrombin Time: 20.8 seconds — ABNORMAL HIGH (ref 11.6–15.2)

## 2011-08-27 NOTE — Consult Note (Signed)
NAMEJAZLENE, [name] NO.:  [id number]  MEDICAL RECORD NO.:  [id number]  LOCATION:  [id number]                         FACILITY:  [facility]  PHYSICIAN:  Alta Corning, M.D.   DATE OF BIRTH:  1928/05/21  DATE OF CONSULTATION: DATE OF DISCHARGE:                                CONSULTATION   She is an 77 year old female who has fallen and we were consulted to the Hospitalist for treatment of a left inferior and superior pubic ramus fracture.  BRIEF HISTORY:  [name] is an 77 year old female who is well known to [facility], bilateral total knees done there and total shoulder done there.  She has had a hip which was recently done by Dr. Zollie Beckers and was doing well from that and recently had a fall onto her left hip.  I was consulted via the emergency room, has specific instructions given to the ER doctors to discuss with the Hospitalist that she could be weightbearing as tolerated, and that we would see her the following morning.  I reviewed her x-rays at that point, and knew that this was a nonoperative problems.  Somehow, this was not passed along to the Hospitalist and ultimately, Dr. Sharol Given was consulted for evaluation of the left hip.  He had a strong concurrence with our plan and did in fact come to see her this morning prior to my seeing her. The patient after we came to see her, which refer a following up the [facility], discussed she has had a 30-year relationship with their office.  I discussed this with Dr. Sharol Given.  He is fine with that, so we are going to be assuming her care as a consult to the Hospitalist for her inferior and superior pubic ramus fracture.  She denies previous problems with the left hip and complained of significant pain and difficulty with ambulation.  PAST MEDICAL HISTORY:  Remarkable for: 1. Having hypertension. 2. Atrial fibrillation. 3. Hyperlipidemia. 4. Osteoporosis.  She has had multiple surgeries as outlined  above.  ALLERGIES:  She is allergies to CODEINE, which causes nausea and vomiting.  SOCIAL HISTORY:  She is not a drinker or smoker.  She can ambulates with the help of a walker at this point.  FAMILY HISTORY:  Reviewed and noncontributory.  All other review of systems were reviewed and have no pertinent positives in relation to her HPI.  PHYSICAL EXAMINATION:  VITAL SIGNS:  On admission showed a temperature of 97.7, respiratory rate of 18, heart rate 61, blood pressure 160/68, pulse ox is 97%. HEENT:  Within normal limits. NECK:  Supple. CHEST:  Clear to auscultation. HEART:  Regular rate and rhythm. ABDOMEN:  Soft and nontender. EXTREMITIES:  Lower extremities show good distal pulses.  No ulcerations on the skin.  She has mild pain with range of motion of the left hip.  LABORATORY DATA:  On admission showed a white blood cell count of 7.8, hemoglobin 10.9, and platelet count of 232 with an INR of 2.46.  X-ray shows a comminuted fracture of the superior and inferior pubic ramus on the left side.  The patient will be admitted to the  Hospitalist Service and will be evaluated per them in terms of her left pubic ramus fracture.  The treatment will be weightbearing as tolerated and ambulation.  If she is unable to begin ambulation, then we would think about getting a CAT scan of her pelvis certainly with the superior and inferior pubic ramus fracture, very commonly there is a fracture of the sacrum as well.  However, it is not critical to make this diagnosis if she is able to start mobilizing on her own.  At this point, we will allow her to mobilize per the Physical Therapy Service.  We will follow her in the hospital.  Hopefully, she will be able to be discharge home. If not, we might consider inpatient rehabilitation consult for her.  I will see her back in the office in about a 2 weeks for repeat x-ray of the pelvis sooner if there are problems.     Alta Corning,  M.D.     Corliss Skains  D:  08/16/2011  T:  08/17/2011  Job:  [id number]  cc:   Hal T. Stoneking, M.D.  Electronically Signed by Dorna Leitz M.D. on 08/27/2011 02:10:53 PM

## 2011-08-27 NOTE — Consult Note (Addendum)
NAMENAMIA, CHRISTOFFERSEN NO.:  [id number]  MEDICAL RECORD NO.:  [id number]  LOCATION:  4001                         FACILITY:  [facility]  PHYSICIAN:  Thayer Headings, M.D. DATE OF BIRTH:  1928/07/07  DATE OF CONSULTATION:  08/25/2011 DATE OF DISCHARGE:                                CONSULTATION   PRIMARY CARDIOLOGIST:  Champ Mungo. Lovena Le, MD.  CHIEF COMPLAINT:  Fall.  HISTORY OF PRESENT ILLNESS:  [name] is a very pleasant 77 year old female with history of AFib, hypertension, and hyperlipidemia and multiple orthopedic surgeries who presented initially to [facility] August 15, 2011 with a sudden onset fall/syncope after picking her clothes from her closet.  She states that she had just been standing there at her closet.  At around 8:30 in the evening, felt somewhat strange and the next thing she knew she was on the floor.  This also happened in August 2012 in which she sustained a right femur fracture requiring ORIF and was subsequently discharged to SNF.  With this episode, she sustained a left superior inferior pubic ramus fracture. She was seen by Orthopedics during her hospitalization, and was placed on a therapy regimen which included weightbearing as tolerated and was subsequently discharged to CIR.  Etiology of her syncope thus was unclear.  There is some consideration for possibility of AFib playing a role in her syncope, however, the patient's heart rate was 60 on admission with blood pressure in the 160s and she was in sinus rhythm without acute changes and EKG with normal QTc.  She has also subsequently been treated for urinary tract infection.  We are asked to see her regarding medication adjustment.  Amiodarone was discontinued on August 07, 2011 because of intolerance secondary to nausea and tremor and the Primary Service is wondering if this may have contributed to her fall.  The patient reports being in normal rhythm for quite some  time and denies any palpitations or rapid heart rate since her admission August 2012.  PAST MEDICAL HISTORY: 1. Paroxysmal atrial fibrillation, diagnosed August 2012.     a.     Intolerant to amiodarone, discontinued August 07, 2011      secondary to nausea and tremors. 2. Hypertension. 3. Hyperlipidemia. 4. Osteoporosis/arthritis with multiple orthopedic surgeries including     bilateral knee replacement and total shoulder replacement. 5. TIA, followed with Dr. Erling Cruz approximately 5 years ago.  MEDICATIONS: 1. Coumadin. 2. Aricept 10 mg daily. 3. Lisinopril 10 mg daily. 4. Lopressor 25 mg at bedtime. 5. OxyContin 10 mg q.12 hours. 6. Senokot. 7. Zocor 20 mg at bedtime. 8. Restoril 30 mg at bedtime. 9. Vitamin B12. 10.Beta-carotene. 11.Vitamin C. 12.Os-Cal.  ALLERGIES:  CODEINE.  The patient is also intolerant to AMIODARONE.  SOCIAL HISTORY:  [name] is widowed.  She denies any tobacco or alcohol use.  FAMILY HISTORY:  Positive for coronary artery disease in her family.  REVIEW OF SYSTEMS:  No fevers, chills, chest pain, shortness of breath, edema, nausea, vomiting, or diarrhea.  No visual changes.  All other systems reviewed and otherwise negative except for those noted in the HPI.  LABORATORY DATA:  WBC 7, hemoglobin 8.7, hematocrit 26.1, platelet count 240,000.  Sodium 135, potassium 4.8 chloride 98, CO2 31, glucose 94, BUN 20, creatinine 0.60, INR 1.85.  RADIOLOGIC STUDIES:  Pelvic CT showed healing left superior and inferior pubic ramus fracture with healing proximal right femur fracture status post ORIF.  PHYSICAL EXAMINATION:  VITAL SIGNS:  Temperature 98.1, pulse 81, respirations 20, blood pressure 151/63, pulse ox 95% on room air. GENERAL:  This is a pleasant very well-appearing elderly white female, in no acute distress.  HEENT:  Normocephalic, atraumatic with extraocular movements intact.  Clear sclerae.  Nares are without discharge. NECK:  Supple  without carotid bruit. HEART:  Auscultation of the heart reveals regular rate and rhythm with S1, S2 without murmurs, rubs, or gallops. LUNGS:  Clear to auscultation bilaterally without wheezes, rales, or rhonchi. ABDOMEN:  Soft, nontender, nondistended.  Positive bowel sounds. EXTREMITIES:  Warm and dry without edema.  She does have deviation of the distal phalanges. NEUROLOGIC:  She is alert and oriented x3. Responds to questions appropriately with a normal affect.  ASSESSMENT/PLAN:  The patient was seen and examined by Dr. Acie Fredrickson and myself.  [name] is a very pleasant 77 year old female with a history of paroxysmal atrial fibrillation, hypertension, prior transient ischemic attack, and arthritis who presented initially August 15, 2011 after her second syncopal episode/fall thus sustaining a pubis ramus fracture.  We have been asked to see her regarding whether or not her amiodarone discontinuation was the __________ of this episode.  At this time, the etiology of her fall is unclear.  It is not convincingly cardiac and there has been no evidence that her cardiac status played a role at this time.  We doubt that atrial fibrillation is a contributing factor to her syncope as this does not usually cause syncope, unless the patient is baseline hypotensive but in this phase her blood pressure was 160 on admission.  She was also noted to be in sinus rhythm on admission and throughout her hospitalization without evidence for arrhythmia on telemetry.  This syncope also happened proceeding amiodarone therapy causing her to fall in August 2012, so we doubt that her discontinuation of amiodarone contributed.  She is currently maintaining sinus rhythm on her own.  At this time, we would recommend to continue her rate control agent of Lopressor, which is typically short-acting, so we will change it to twice a day as her blood pressure tolerates.  She may benefit from a 30 day event monitor  after discharge to evaluate for possible post conversion pauses, although the patient denies any palpitations indicating atrial fibrillation prior to her syncope.  She should have followup scheduled with [place] upon discharge.  So please call our office when she is nearing such.  Please call with questions.     Melina Copa, P.A.C.   ______________________________ Thayer Headings, M.D.    DD/MEDQ  D:  08/25/2011  T:  08/25/2011  Job:  [id number]  cc:   Champ Mungo. Lovena Le, MD  Electronically Signed by Mertie Moores M.D. on 08/27/2011 05:44:33 AM Electronically Signed by Melina Copa  on 09/01/2011 02:06:15 PM

## 2011-08-28 ENCOUNTER — Telehealth: Payer: Self-pay | Admitting: Internal Medicine

## 2011-08-28 DIAGNOSIS — R55 Syncope and collapse: Secondary | ICD-10-CM

## 2011-08-28 DIAGNOSIS — Z5189 Encounter for other specified aftercare: Secondary | ICD-10-CM

## 2011-08-28 DIAGNOSIS — N390 Urinary tract infection, site not specified: Secondary | ICD-10-CM

## 2011-08-28 DIAGNOSIS — I4891 Unspecified atrial fibrillation: Secondary | ICD-10-CM

## 2011-08-28 DIAGNOSIS — S32509A Unspecified fracture of unspecified pubis, initial encounter for closed fracture: Secondary | ICD-10-CM

## 2011-08-28 LAB — PROTIME-INR: INR: 2.1 — ABNORMAL HIGH (ref 0.00–1.49)

## 2011-08-28 NOTE — Telephone Encounter (Signed)
Dose clarified with [name].

## 2011-08-28 NOTE — Telephone Encounter (Signed)
Pt has a prescription for rx with a different dose.  Call transferred to the coumadin clinic for clarification

## 2011-08-28 NOTE — Telephone Encounter (Signed)
Ebony Hail from [place] calling wanting to double check the dosage of pt warfarin. Please return call to discuss further.

## 2011-08-28 NOTE — Telephone Encounter (Signed)
Clarified dosing with [name].  Pt discharged with 5mg  tablet but has previously been on 3mg  tablet.  Will have her continue with 3mg  tablet and previous dose rather than filling new prescription.  [name] states she will inform patient

## 2011-08-31 NOTE — Discharge Summary (Signed)
[name], [name] NO.:  [id number]  MEDICAL RECORD NO.:  [id number]  LOCATION:                                 FACILITY:  PHYSICIAN:  Domenic Polite, MD     DATE OF BIRTH:  Apr 24, 1928  DATE OF ADMISSION:  08/15/2011 DATE OF DISCHARGE:  08/18/2011                              DISCHARGE SUMMARY   PRIMARY CARE PHYSICIAN:  Hal T. [place], MD.  PRIMARY ORTHOPEDIC SURGEON:  Lind Guest. Ninfa Linden, MD.  DISCHARGE DIAGNOSES: 1. Pelvic fracture status post fall. 2. Enterococcus urinary tract infection. 3. Paroxysmal atrial fibrillation. 4. Syncope, workup unremarkable. 5. Osteoporosis. 6. Hypertension. 7. Dyslipidemia. 8. History of multiple orthopedic surgeries on her hips, knees, and     shoulder.  DISCHARGE MEDICATIONS: 1. Tylenol 650 mg q.4 hours p.r.n. 2. Levofloxacin 250 mg p.o. daily for 2 days. 3. OxyContin 10 mg p.o. b.i.d. 4. Tramadol 50 mg p.o. q.6 hours p.r.n. 5. Aricept 10 mg at bedtime. 6. I-CAPS one tablet b.i.d. 7. Lisinopril 10 mg daily. 8. Meloxicam 50 mg daily. 9. Metoprolol tartrate 25 mg p.o. at bedtime. 10.Oxybutynin 10 mg b.i.d. 11.Omega-3 over-the-counter two capsules daily. 12.Prevacid 15 mg daily p.r.n. 13.Simvastatin 20 mg p.o. at bedtime. 14.Temazepam 30 mg capsule p.o. at bedtime. 15.Tramadol 50 mg 1-2 tabs q.4 hours p.r.n. 16.Vitamin B12 over-the-counter one tablet daily. 17.Vitamin C 500 mg daily. 18.Coumadin 3 mg tablet alternating 3 mg tablet, alternates 1 and 1.5     mg tablets daily.  CONSULTANTS: 1. Lind Guest. Ninfa Linden, [place], MD.  DIAGNOSTICS AND INVESTIGATIONS:  Include, CT angio of the chest is negative for PE.  No acute cardiopulmonary abnormalities.  X-ray of the femur shows no evidence of femur fracture.  X-ray of the pelvis shows comminuted fracture of the left superior and inferior pubic rami.  HOSPITAL COURSE:  [name] is a very pleasant 77 year old female, who presented to  the hospital after a syncopal event and subsequent fall with pelvic pain.  On further evaluation in the emergency room, she was found to have a pelvic fracture i.e., superior and inferior pubic rami fracture, and due to her syncopal event, she was admitted for the same.  1. For her pelvic fracture, she was seen by Dr. Berenice Primas in     consultation, who recommended physical therapy and rehab consult as     well as narcotics for better pain control.  Subsequently, good pain     control was achieved with OxyContin and tramadol p.r.n.  She was     seen by inpatient rehab in consultation and who was able to take     the patient to rehab on the first floor. 2. Enterococcus UTI.  This was detected through her hospital stay.     She was originally on Rocephin.  She subsequently changed to     levofloxacin. 3. Paroxysmal AFib.  She was continued on metoprolol as well as     Coumadin for seizure prevention. 4. Syncope.  Her workup had been unremarkable with regards to this she     did not have any events on telemetry.  Vital signs remained  stablethrough her hospital course, was ruled out for MI based on cardiac     markers as well as 2D echo, which is unremarkable as well.  At this     point, she is being discharged to inpatient rehab for physical     rehabilitation and should follow up with Dr. Lajean Manes after     discharge from inpatient rehab.     Domenic Polite, MD     PJ/MEDQ  D:  08/27/2011  T:  08/27/2011  Job:  [id number]  cc:   Hal T. Stoneking, M.D.  Electronically Signed by Domenic Polite  on 08/31/2011 03:31:13 PM

## 2011-09-01 ENCOUNTER — Ambulatory Visit (INDEPENDENT_AMBULATORY_CARE_PROVIDER_SITE_OTHER): Payer: Self-pay | Admitting: Cardiology

## 2011-09-01 DIAGNOSIS — Z7901 Long term (current) use of anticoagulants: Secondary | ICD-10-CM

## 2011-09-01 DIAGNOSIS — I4891 Unspecified atrial fibrillation: Secondary | ICD-10-CM

## 2011-09-01 DIAGNOSIS — R0989 Other specified symptoms and signs involving the circulatory and respiratory systems: Secondary | ICD-10-CM

## 2011-09-01 LAB — POCT INR: INR: 2.2

## 2011-09-03 NOTE — Discharge Summary (Signed)
[name], [name] NO.:  [id number]  MEDICAL RECORD NO.:  [id number]  LOCATION:  [id number]                         FACILITY:  [facility]  PHYSICIAN:  Meredith Staggers, M.D.DATE OF BIRTH:  12/07/1927  DATE OF ADMISSION:  08/18/2011 DATE OF DISCHARGE:  08/28/2011                              DISCHARGE SUMMARY   DISCHARGE DIAGNOSES: 1. Left superior-inferior pubic rami fracture. 2. Chronic Coumadin therapy for atrial fibrillation. 3. Hypertension. 4. Urinary tract infection, resolved. 5. Early dementia. 6. Hyperlipidemia. 7. Left ankle sprain. 8. History of bilateral total knee replacement.  HISTORY:  An 77 year old female with a history of left total knee replacement in 2001, recent right intertrochanteric femur fracture with open reduction internal fixation in August 2012, discharged to skilled facility.  She has since returned home, reported fall, questionable due to syncope on August 15, 2011, sustained a left superior-inferior pubic rami fracture.  CT angio of the chest negative for pulmonary emboli. Orthopedic Service was consulted, Dr. Sharol Given, advised weightbearing as tolerated.  Chronic Coumadin therapy ongoing for atrial fibrillation. Echocardiogram with ejection fraction of 55% and grade 1 diastolic dysfunction.  Pain control with OxyContin sustained release. Urinalysis: 21-50 wbc's, positive nitrite, and placed on Levaquin on August 18, 2011.  Moderate assist for mobility.  She was admitted for comprehensive rehab program.  PAST MEDICAL HISTORY:  See discharge diagnoses.  No alcohol or tobacco.  ALLERGIES:  CODEINE.  SOCIAL HISTORY:  Lives alone.  Recently discharged from [facility] after 5 weeks for hip fracture.  Son in the area can assist, but limited for 24/7 care, 1-level home, no steps to entry.  FUNCTIONAL HISTORY:  Prior to admission, was independent with a walker. She does not drive.  FUNCTIONAL STATUS:  Upon admission  to [facility], was moderate assist bed mobility and transfers, moderate assist ambulate 5 feet, total assist lower body activities of daily living.  MEDICATIONS PRIOR TO ADMISSION: 1. Lisinopril 10 mg daily. 2. Coumadin daily. 3. Restoril 30 mg at bedtime. 4. Metoprolol 25 mg at bedtime. 5. Oxybutynin 10 mg twice daily. 6. Zocor 20 mg daily. 7. Meloxicam 15 mg daily. 8. Aricept 10 mg at bedtime. 9. Omega acid daily. 10.Vitamin B daily. 11.Ultram as needed.  PHYSICAL EXAMINATION:  VITAL SIGNS:  Blood pressure 128/46, pulse 62, temperature 97.9, respirations 18. GENERAL:  This was an alert female, in no acute distress, oriented x3. MUSCULOSKELETAL:  Deep tendon reflexes 2+.  Sensation intact to light touch.  Decreased passive range of motion to the right shoulder girdle. LUNGS:  Clear to auscultation. CARDIAC:  Regular rate and rhythm. ABDOMEN:  Soft, nontender.  Good bowel sounds.  REHABILITATION HOSPITAL COURSE:  The patient was admitted to Inpatient Rehab Services with therapies initiated on a 3-hour daily basis, consisting of physical therapy, occupational therapy, and rehabilitation nursing.  The following issues were addressed during the patient's rehabilitation stay pertaining to [name]'s left superior inferior pubic rami fracture.  Neurovascular sensation intact.  She is weightbearing as tolerated.  She remained on OxyContin sustained release for pain control as well as Ultram for breakthrough pain.  Her blood pressures remained well controlled  on lisinopril and Lopressor.  Her Lopressor had been adjusted for rate control per Cardiology Services to 25 mg twice daily.  She had completed an empiric course of Levaquin for urinary tract infection with no dysuria.  Maintained on Aricept for some early dementia, however, she exhibited no unsafe behavior and she had very good processing.  Zocor for hyperlipidemia was without issue.  She did have some complaints of left  ankle pain.  X-rays did show some soft tissue swelling, however, no fracture.  She was fitted with an air cast for comfort.  The patient received weekly collaborative interdisciplinary team conferences to discuss estimated length of stay, family teaching, and any barriers to discharge.  She was supervision minimal assist for mobility.  Occasional minimal assist for activities of daily living other than that she was supervision for this.  Her strength and endurance had greatly improved.  She was encouraged overall progress and plans will be discharged home.  She did remain on chronic Coumadin for history of atrial fibrillation with latest INR of 1.76, followed by the [facility].  Latest hemoglobin of 8.7, hematocrit 26.1, platelet 240,000.  Chemistries with a sodium 132, potassium 4.0, BUN 19, creatinine 0.5.  DISCHARGE MEDICATIONS:  At the time of dictation included: 1. Coumadin 7.5 mg daily adjusted for an INR of 2.0-3.0. 2. Vitamin C 500 mg daily. 3. Ocuvite 1 tablet twice daily. 4. Os-Cal 500 mg daily. 5. Aricept 10 mg at bedtime. 6. Lisinopril 10 mg daily. 7. Lopressor 25 mg twice daily. 8. OxyContin sustained release 10 mg every 12 hours  x2 weeks and     stop. 9. Zocor 20 mg at bedtime. 10.Restoril 30 mg daily. 11.Ultram 100 mg every 6 hours as needed for pain, dispense of 60     tablets.  DIET:  Regular.  SPECIAL INSTRUCTIONS:  Weightbearing as tolerated.  Ongoing therapies dictated as per [employer].  Follow up with Dr. Lajean Manes for medical management.  [place] nurse had been arranged for Coumadin therapy with results to the [facility].     Lauraine Rinne, P.A.   ______________________________ Meredith Staggers, M.D.    DA/MEDQ  D:  08/27/2011  T:  08/27/2011  Job:  [id number]  cc:   Hal T. Stoneking, M.D. Newt Minion, MD [facility]  Electronically Signed by Lauraine Rinne P.A. on 08/31/2011 02:59:08  PM Electronically Signed by Alger Simons M.D. on 09/03/2011 11:13:09 PM

## 2011-09-03 NOTE — H&P (Signed)
[name], [name] NO.:  [id number]  MEDICAL RECORD NO.:  [id number]  LOCATION:                                 FACILITY:  PHYSICIAN:  Meredith Staggers, M.D.DATE OF BIRTH:  1928/01/24  DATE OF ADMISSION:  08/18/2011 DATE OF DISCHARGE:                             HISTORY & PHYSICAL   CHIEF COMPLAINT:  Left pelvic pain.  PRIMARY CARE PHYSICIAN:  Hal T. Wood[place], MD  HISTORY OF PRESENT ILLNESS:  This is a 77 year old white female with history of left total knee replacement in 2001 and multiple other joint replacements.  She was on rehab at that time with [place] as well.  She had a recent right intertrochanteric femur fracture requiring ORIF on August 12.  She was discharged to a skilled nursing facility.  Again she sustained a left superior inferior pubic ramus fracture once home.  CT angio was negative.  The patient has had paroxysmal atrial fibrillation and felt this might be contributing to her syncope and falls. Orthopedics Surgery saw this patient and made weightbearing as tolerated, left lower extremity.  Echocardiogram was performed with an ejection fraction of 55% with grade 1 diastolic dysfunction.  Pain control has been controlled somewhat with OxyContin CR with Ultram in between.  Urinalysis was positive and she was placed on Levaquin with culture and sensitivity pending.  I was asked to evaluate this patient on inpatient rehab and felt that she could benefit from inpatient stay.  REVIEW OF SYSTEMS:  Notable for insomnia, incontinence, palpitation, bilateral hip pain.  Denies any syncope or chest pain.  Full 12-point review is in the written H and P.  PAST MEDICAL HISTORY:  Positive for hypertension, hyperlipidemia, osteoporosis, early dementia, which is minimal at best, bilateral total knee replacements, right intertrochanteric hip fracture status post ORIF in August 2012, right shoulder surgery in 2011, T and A.  SOCIAL HISTORY:  She denies  alcohol or tobacco.  FAMILY HISTORY:  Positive for CAD.  SOCIAL HISTORY:  The patient lives alone, recently discharged from [facility].  After this admission she plans to hire help once home.  She has 1 level house with no steps to enter.  ALLERGIES:  CODEINE.  HOME MEDICATIONS:  Lisinopril, Coumadin, Restoril, metoprolol, oxybutynin, Zocor, meloxicam, Aricept, omega-3 fatty acids, and vitamin B and C daily.  LABORATORY DATA:  Hemoglobin 9.3, white count 6.2, platelets 227,000. Sodium 138, potassium 4, BUN 16, creatinine 0.5.  PHYSICAL EXAMINATION:  VITAL SIGNS:  Blood pressure is 128/46, pulse 62, respiratory rate 18, temperature 97.9. GENERAL:  The patient is pleasant, alert. HEENT:  Pupils equal, round, and react to light.  Ear, nose, and throat exam is notable for good dentition.  Pink moist mucosa. NECK:  Supple without JVD or lymphadenopathy. CHEST:  Clear to auscultation bilaterally without wheezes, rales, or rhonchi. HEART:  Regular rate and rhythm without murmur, rubs, or gallops. ABDOMEN:  Soft, nontender.  Bowel sounds are positive. SKIN:  Generally intact with old scarring noted.  No major bruising was seen. NEUROLOGICAL:  Cranial nerve exam II-XII are grossly intact.  Reflexes 1+.  Sensation was normal in all 4 limbs.  Judgment, orientation, memory, and mood were all quite functional.  Strength in the upper extremities is grossly 4-5/5.  She had some diminishment proximally due to some shoulder pain.  Left hip was 1/5 with flexion due to pain.  She has 2+ at the knee with extension flexion 4/5 at the ankle.  Right hip was 2 to 2+/5 hip flexion, right knee extension flexion 4/5.  Right ankle dorsiflexion plantar flexion 4-5/5.  POST ADMISSION PHYSICIAN EVALUATION: 1. Functional deficit secondary to left superior/inferior pubic rami     fracture.  The patient with history of recent falls most likely     brought on by her paroxysmal atrial fibrillation. 2. The  patient is admitted to receive collaborative interdisciplinary     care between the physiatrist, rehab nursing staff, and therapy     team. 3. The patient's level of medical complexity and substantial therapy     needs in context of that medical necessity cannot be provided at a     lesser intensity of care. 4. The patient experienced substantial functional loss from her     baseline.  Premorbidly, she had been independent and walking.  She     was beginning to drive again as well doing some trials with therapy     apparently.  Upon functional assessment most recently, she is mod     assist bed mobility and transfers.  She is mod assist 5 feet     rolling walker, and she is total assist lower body ADLs.  Judging     by the patient's diagnosis, physical exam, and functional history,     she has potential for functional progress which will result in     measurable gains while in inpatient rehab.  These gains will be of     substantial and practical use upon discharge to home in     facilitating mobility and self-care. 5. The physiatrist will provide 24 hour management of medical needs as     well as oversight of the therapy plan/treatment and provide     guidance as appropriate regarding interaction of the two.  Medical     problem list and plan are below. 6. A 24-hour rehab nursing team will assist in management and     integration of therapy concepts, techniques, education. 7. PT will assess and treat for lower extremity strength, range of     motion, functional mobility, pain management, adaptive equipment,     safety, family, and caregiver education with goals modified     independent supervision. 8. OT will assess and treat for upper extremity use, ADLs, adaptive     techniques, equipment, pain management, safety techniques with     goals modified independent to set up. 9. Case management and social worker will assess and treat for     psychosocial issues and discharge  planning. 10.Team conference will be held weekly to assess progress towards     goals and to determine barriers at discharge. 11.The patient demonstrated sufficient medical stability, exercise     capacity to tolerate at least 3 hours therapy per day at least 5     days per week. 12.Estimated length of stay is approximately 10-14 days.  Prognosis is     good.  The patient is very motivated.  MEDICAL PROBLEM LIST AND PLAN: 1. DVT prophylaxis with chronic Coumadin.  She does have paroxysmal     atrial fibrillation.  INR today is 2.59.  We will follow serial  INRs and check for any bleeding complications.  No bruising or     signs of blood loss was noted at present.  Most recent hemoglobin     was 9.3. 2. Low blood pressure heart rate control:  Lisinopril with Lopressor.     The inpatient rehab floor as an ideal location to monitor the     patient's heart rate and rhythm, particularly with her increased     activity.  We may need to make further adjustments to her     chronotropic medications based on her heart rate and rhythm going     forward.  Obviously this has led to her recent falls. 3. History of UTI:  Empiric Levaquin on board.  Await culture and     sensitivity.  Check PVRs and follow for voiding schedule. 4. Early dementia:  Aricept:  In talking to friends and family, her     memory was fairly good and she was living independently at home. 5. Hyperlipidemia:  Zocor. 6. Pain management:  The patient has done a bit better with OxyContin     CR 10 mg q.12 hours.  She is using Ultram p.r.n. between doses.     Consider scheduling Ultram as well.  I hesitate going much further     with her OxyContin given her age and dementia history.     Meredith Staggers, M.D.     ZTS/MEDQ  D:  08/18/2011  T:  08/18/2011  Job:  [id number]  cc:   Hal T. Stoneking, M.D.  Electronically Signed by Alger Simons M.D. on 09/03/2011 11:13:03 PM

## 2011-09-09 ENCOUNTER — Ambulatory Visit (INDEPENDENT_AMBULATORY_CARE_PROVIDER_SITE_OTHER): Payer: Self-pay | Admitting: Cardiology

## 2011-09-09 DIAGNOSIS — R0989 Other specified symptoms and signs involving the circulatory and respiratory systems: Secondary | ICD-10-CM

## 2011-09-09 DIAGNOSIS — I4891 Unspecified atrial fibrillation: Secondary | ICD-10-CM

## 2011-09-09 DIAGNOSIS — Z7901 Long term (current) use of anticoagulants: Secondary | ICD-10-CM

## 2011-09-10 ENCOUNTER — Ambulatory Visit: Payer: PRIVATE HEALTH INSURANCE | Admitting: Internal Medicine

## 2011-09-12 ENCOUNTER — Other Ambulatory Visit: Payer: Self-pay | Admitting: Internal Medicine

## 2011-09-14 ENCOUNTER — Other Ambulatory Visit: Payer: Self-pay | Admitting: *Deleted

## 2011-09-14 ENCOUNTER — Other Ambulatory Visit: Payer: Self-pay | Admitting: Internal Medicine

## 2011-09-16 ENCOUNTER — Ambulatory Visit (INDEPENDENT_AMBULATORY_CARE_PROVIDER_SITE_OTHER): Payer: Self-pay | Admitting: Cardiology

## 2011-09-16 DIAGNOSIS — Z7901 Long term (current) use of anticoagulants: Secondary | ICD-10-CM

## 2011-09-16 DIAGNOSIS — R0989 Other specified symptoms and signs involving the circulatory and respiratory systems: Secondary | ICD-10-CM

## 2011-09-16 DIAGNOSIS — I4891 Unspecified atrial fibrillation: Secondary | ICD-10-CM

## 2011-09-16 NOTE — Telephone Encounter (Signed)
New problem  Per christy from [facility]. Patient having side effect from medication & also additional orders.

## 2011-09-16 NOTE — Telephone Encounter (Signed)
[id number]  This is [name]'s # with [facility].  She is taking the Metoprolol twice daily.  She is experiencing palpitations prior to her afternoon dose  I suggested she push her evening dose up to 5pm to see if this helps her with the break through palpitations  She will call me back for future problems

## 2011-09-17 ENCOUNTER — Other Ambulatory Visit: Payer: Self-pay

## 2011-09-17 ENCOUNTER — Emergency Department (HOSPITAL_COMMUNITY): Payer: Medicare Other

## 2011-09-17 ENCOUNTER — Inpatient Hospital Stay (HOSPITAL_COMMUNITY)
Admission: EM | Admit: 2011-09-17 | Discharge: 2011-09-20 | DRG: 293 | Disposition: A | Discharge status: Home / Self Care | Payer: Medicare Other | Attending: Internal Medicine | Admitting: Internal Medicine

## 2011-09-17 ENCOUNTER — Encounter (HOSPITAL_COMMUNITY): Payer: Self-pay | Admitting: *Deleted

## 2011-09-17 DIAGNOSIS — I4891 Unspecified atrial fibrillation: Secondary | ICD-10-CM | POA: Diagnosis present

## 2011-09-17 DIAGNOSIS — I509 Heart failure, unspecified: Secondary | ICD-10-CM | POA: Diagnosis present

## 2011-09-17 DIAGNOSIS — I10 Essential (primary) hypertension: Secondary | ICD-10-CM

## 2011-09-17 DIAGNOSIS — E785 Hyperlipidemia, unspecified: Secondary | ICD-10-CM | POA: Diagnosis present

## 2011-09-17 DIAGNOSIS — F419 Anxiety disorder, unspecified: Secondary | ICD-10-CM

## 2011-09-17 DIAGNOSIS — I5031 Acute diastolic (congestive) heart failure: Principal | ICD-10-CM | POA: Diagnosis present

## 2011-09-17 DIAGNOSIS — Z96659 Presence of unspecified artificial knee joint: Secondary | ICD-10-CM

## 2011-09-17 DIAGNOSIS — M810 Age-related osteoporosis without current pathological fracture: Secondary | ICD-10-CM | POA: Diagnosis present

## 2011-09-17 DIAGNOSIS — Z7901 Long term (current) use of anticoagulants: Secondary | ICD-10-CM | POA: Diagnosis present

## 2011-09-17 DIAGNOSIS — E876 Hypokalemia: Secondary | ICD-10-CM | POA: Diagnosis present

## 2011-09-17 DIAGNOSIS — F0390 Unspecified dementia without behavioral disturbance: Secondary | ICD-10-CM | POA: Diagnosis present

## 2011-09-17 HISTORY — DX: Unspecified osteoarthritis, unspecified site: M19.90

## 2011-09-17 LAB — CARDIAC PANEL(CRET KIN+CKTOT+MB+TROPI)
CK, MB: 5.2 ng/mL — ABNORMAL HIGH (ref 0.3–4.0)
Relative Index: INVALID (ref 0.0–2.5)
Total CK: 87 U/L (ref 7–177)
Troponin I: <0.3 ng/mL (ref <0.30)

## 2011-09-17 LAB — BASIC METABOLIC PANEL
CO2: 25 mEq/L (ref 19–32)
Chloride: 96 mEq/L (ref 96–112)
Creatinine, Ser: 0.57 mg/dL (ref 0.50–1.10)
GFR calc Af Amer: >90 mL/min (ref >90)
Potassium: 3.9 mEq/L (ref 3.5–5.1)
Sodium: 132 mEq/L — ABNORMAL LOW (ref 135–145)

## 2011-09-17 LAB — CBC
MCV: 88.6 fL (ref 78.0–100.0)
Platelets: 178 10*3/uL (ref 150–400)
RBC: 3.41 MIL/uL — ABNORMAL LOW (ref 3.87–5.11)
RDW: 15.7 % — ABNORMAL HIGH (ref 11.5–15.5)
WBC: 5.9 10*3/uL (ref 4.0–10.5)

## 2011-09-17 LAB — PRO B NATRIURETIC PEPTIDE: Pro B Natriuretic peptide (BNP): 5109 pg/mL — ABNORMAL HIGH (ref 0–450)

## 2011-09-17 MED ORDER — ENOXAPARIN SODIUM 40 MG/0.4ML ~~LOC~~ SOLN
40.0000 mg | SUBCUTANEOUS | Status: DC
  Administered 2011-09-18: 40 mg via SUBCUTANEOUS
  Filled 2011-09-17 (×3): qty 0.4

## 2011-09-17 MED ORDER — HYDROCODONE-ACETAMINOPHEN 5-325 MG PO TABS: 1.0000 | ORAL_TABLET | Freq: Four times a day (QID) | ORAL | Status: DC | PRN

## 2011-09-17 MED ORDER — NITROGLYCERIN IN D5W 200-5 MCG/ML-% IV SOLN
2.0000 ug/min | INTRAVENOUS | Status: DC
  Administered 2011-09-17: 10 ug/min via INTRAVENOUS
  Administered 2011-09-17: 5 ug/min via INTRAVENOUS
  Filled 2011-09-17: qty 250

## 2011-09-17 MED ORDER — DONEPEZIL HCL 10 MG PO TABS
10.0000 mg | ORAL_TABLET | Freq: Every day | ORAL | Status: DC
  Administered 2011-09-18 – 2011-09-19 (×2): 10 mg via ORAL
  Filled 2011-09-17 (×3): qty 1

## 2011-09-17 MED ORDER — SODIUM CHLORIDE 0.9 % IJ SOLN
3.0000 mL | Freq: Two times a day (BID) | INTRAMUSCULAR | Status: DC
  Administered 2011-09-17 – 2011-09-20 (×5): 3 mL via INTRAVENOUS

## 2011-09-17 MED ORDER — TRAMADOL HCL 50 MG PO TABS
50.0000 mg | ORAL_TABLET | Freq: Four times a day (QID) | ORAL | Status: DC | PRN
  Administered 2011-09-18 – 2011-09-19 (×6): 50 mg via ORAL
  Filled 2011-09-17 (×6): qty 1

## 2011-09-17 MED ORDER — SIMVASTATIN 40 MG PO TABS
40.0000 mg | ORAL_TABLET | Freq: Every day | ORAL | Status: DC
  Administered 2011-09-18 – 2011-09-19 (×2): 40 mg via ORAL
  Filled 2011-09-17 (×3): qty 1

## 2011-09-17 MED ORDER — SODIUM CHLORIDE 0.9 % IJ SOLN: 3.0000 mL | INTRAMUSCULAR | Status: DC | PRN

## 2011-09-17 MED ORDER — LISINOPRIL 10 MG PO TABS
10.0000 mg | ORAL_TABLET | Freq: Every day | ORAL | Status: DC
  Administered 2011-09-18 – 2011-09-20 (×3): 10 mg via ORAL
  Filled 2011-09-17 (×3): qty 1

## 2011-09-17 MED ORDER — METOPROLOL TARTRATE 25 MG PO TABS
25.0000 mg | ORAL_TABLET | Freq: Two times a day (BID) | ORAL | Status: DC
  Administered 2011-09-18 – 2011-09-20 (×6): 25 mg via ORAL
  Filled 2011-09-17 (×7): qty 1

## 2011-09-17 MED ORDER — ASPIRIN 325 MG PO TABS
325.0000 mg | ORAL_TABLET | ORAL | Status: AC
  Administered 2011-09-17: 325 mg via ORAL
  Filled 2011-09-17: qty 1

## 2011-09-17 MED ORDER — SODIUM CHLORIDE 0.9 % IV SOLN: 250.0000 mL | INTRAVENOUS | Status: DC

## 2011-09-17 MED ORDER — VITAMIN D3 25 MCG (1000 UNIT) PO TABS
5000.0000 [IU] | ORAL_TABLET | ORAL | Status: DC
  Administered 2011-09-19: 5000 [IU] via ORAL
  Filled 2011-09-17 (×4): qty 5

## 2011-09-17 MED ORDER — HYDRALAZINE HCL 20 MG/ML IJ SOLN
5.0000 mg | INTRAMUSCULAR | Status: DC
  Filled 2011-09-17: qty 0.25

## 2011-09-17 MED ORDER — HYDRALAZINE HCL 20 MG/ML IJ SOLN
10.0000 mg | Freq: Once | INTRAMUSCULAR | Status: AC
  Administered 2011-09-17: 10 mg via INTRAVENOUS

## 2011-09-17 MED ORDER — WARFARIN SODIUM 4 MG PO TABS
4.5000 mg | ORAL_TABLET | Freq: Once | ORAL | Status: AC
  Administered 2011-09-18: 4.5 mg via ORAL
  Filled 2011-09-17: qty 1

## 2011-09-17 MED ORDER — WARFARIN SODIUM 3 MG PO TABS: 3.0000 mg | ORAL_TABLET | Freq: Every day | ORAL | Status: DC

## 2011-09-17 MED ORDER — FUROSEMIDE 10 MG/ML IJ SOLN
40.0000 mg | Freq: Once | INTRAMUSCULAR | Status: AC
  Administered 2011-09-17: 40 mg via INTRAVENOUS
  Filled 2011-09-17: qty 4

## 2011-09-17 MED ORDER — SALINE SPRAY 0.65 % NA SOLN
1.0000 | NASAL | Status: DC | PRN
  Administered 2011-09-18: 1 via NASAL
  Filled 2011-09-17: qty 44

## 2011-09-17 MED ORDER — FUROSEMIDE 10 MG/ML IJ SOLN
40.0000 mg | Freq: Two times a day (BID) | INTRAMUSCULAR | Status: AC
  Administered 2011-09-18 – 2011-09-19 (×5): 40 mg via INTRAVENOUS
  Filled 2011-09-17 (×6): qty 4

## 2011-09-17 MED ORDER — VITAMIN C 500 MG PO TABS
500.0000 mg | ORAL_TABLET | Freq: Every day | ORAL | Status: DC
  Administered 2011-09-19 – 2011-09-20 (×2): 500 mg via ORAL
  Filled 2011-09-17 (×3): qty 1

## 2011-09-17 NOTE — ED Notes (Signed)
Pt has significant swelling in her legs which she reports is new. She also became very SOB while using the bathroom. She reports that she has missed her dose of Coumadin tonight (she was reportedly supposed to take a 3mg  dose) and her metoprolol.

## 2011-09-17 NOTE — ED Provider Notes (Signed)
History     CSN: [id number] Arrival date & time: 09/17/2011  4:41 PM   First MD Initiated Contact with Patient 09/17/11 1658      Chief Complaint  Patient presents with  . Shortness of Breath  . Tachycardia    (Consider location/radiation/quality/duration/timing/severity/associated sxs/prior treatment) Patient is a 77 y.o. female presenting with shortness of breath. The history is provided by the patient.  Shortness of Breath  Associated symptoms include chest pain and shortness of breath. Pertinent negatives include no fever and no cough.   the patient is an 77 year old female with a history of atrial fibrillation, who presents to the emergency department complaining of pressure in her chest with shortness of breath and nausea.  It began around noon time today, and resolved after she arrived at the emergency department.  She felt as if her heart was racing.  She denies fevers, chills, cough, vomiting, or diarrhea or any other illness recently.  She has not taken any new medicines and has not changed her medicines.  She denies leg pain or swelling.  She denies a history of CHF or COPD and does not use oxygen at home.  Past Medical History  Diagnosis Date  . Hypertension   . Hypercholesteremia   . Coronary artery disease   . Dementia in Alzheimer's disease with early onset   . Osteoporosis   . Atrial fibrillation     Past Surgical History  Procedure Date  . Replacement total knee bilateral   . Right shoulder replacement   . Right intertrochanteric hip fracture status post orif August 2012    Family History  Problem Relation Age of Onset  . Coronary artery disease      History  Substance Use Topics  . Smoking status: Never Smoker   . Smokeless tobacco: Not on file  . Alcohol Use: 0.6 oz/week    1 Glasses of wine per week    OB History    Grav Para Term Preterm Abortions TAB SAB Ect Mult Living                  Review of Systems  Constitutional: Negative for  fever and chills.  HENT: Negative for nosebleeds.   Eyes: Negative for redness.  Respiratory: Positive for chest tightness and shortness of breath. Negative for cough.   Cardiovascular: Positive for chest pain, palpitations and leg swelling.  Gastrointestinal: Positive for nausea. Negative for vomiting, diarrhea and constipation.  Genitourinary: Negative for dysuria.  Skin: Negative for rash.  Neurological: Negative for weakness and headaches.  Psychiatric/Behavioral: Negative for confusion.    Allergies  Codeine  Home Medications   Current Outpatient Rx  Name Route Sig Dispense Refill  . VITAMIN D3 5000 UNITS PO CAPS Oral Take by mouth.      . DONEPEZIL HCL 10 MG PO TABS Oral Take 10 mg by mouth at bedtime.      Marland Kitchen HYDROCODONE-ACETAMINOPHEN 5-325 MG PO TABS Oral Take 1 tablet by mouth every 6 (six) hours as needed. PAIN.    Marland Kitchen LISINOPRIL 10 MG PO TABS Oral Take 10 mg by mouth daily.      . MELOXICAM 15 MG PO TABS Oral Take 15 mg by mouth daily.      Marland Kitchen METHOCARBAMOL 500 MG PO TABS Oral Take 500 mg by mouth as needed. MUSCLE SPASM.    Marland Kitchen METOPROLOL TARTRATE 25 MG PO TABS Oral Take 25 mg by mouth 2 (two) times daily.      Marland Kitchen SIMVASTATIN 40  MG PO TABS Oral Take 40 mg by mouth at bedtime.      . TRAMADOL HCL 50 MG PO TABS Oral Take 50 mg by mouth every 6 (six) hours as needed. PAIN.    Marland Kitchen WARFARIN SODIUM 3 MG PO TABS         BP 182/76  Pulse 60  Temp(Src) 98.5 F (36.9 C) (Oral)  Resp 19  Ht 4\' 11"  (1.499 m)  Wt 115 lb (52.164 kg)  BMI 23.23 kg/m2  SpO2 100%  Physical Exam  Constitutional: She is oriented to person, place, and time.       Frail elderly female, in no distress  HENT:  Head: Normocephalic and atraumatic.  Eyes: Pupils are equal, round, and reactive to light.  Neck: Normal range of motion.  Cardiovascular: Normal rate, regular rhythm and normal heart sounds.   No murmur heard. Pulmonary/Chest: Effort normal and breath sounds normal. No respiratory distress. She  has no wheezes. She has no rales.  Abdominal: Soft. She exhibits no distension and no mass. There is no tenderness. There is no rebound and no guarding.  Musculoskeletal: Normal range of motion. She exhibits edema. She exhibits no tenderness.  Neurological: She is alert and oriented to person, place, and time. No cranial nerve deficit.  Skin: Skin is warm and dry. No rash noted. No erythema.  Psychiatric: She has a normal mood and affect. Her behavior is normal.    ED Course  Procedures (including critical care time)  77 year old female with a history of atrial fibrillation presented to the ER with symptoms consistent with ACS.  She also felt as if her heart were racing.  However, now.  She is in a normal sinus rhythm.  I will perform a chest x-ray, cardiac enzymes, and then discuss this with her cardiologist her primary care doctor.  Depending on the test results, and the patient's condition   Labs Reviewed  CBC  BASIC METABOLIC PANEL  POCT CARDIAC MARKERS  PRO B NATRIURETIC PEPTIDE  CARDIAC PANEL(CRET KIN+CKTOT+MB+TROPI)  PROTIME-INR  I-STAT TROPONIN I   Dg Chest 2 View  09/17/2011  *RADIOLOGY REPORT*  Clinical Data: Pressure in chest and extreme shortness of breath. History of atrial fibrillation.  Hypertension.  CHEST - 2 VIEW  Comparison: 06/04/2011.  Findings: Cardiomegaly.  The interstitial and alveolar prominence in the perihilar regions with small bilateral effusions suggest early congestive heart failure.  Worsening aeration compared with priors.  Calcified tortuous aorta.  Right total shoulder replacement.  Severe left shoulder degenerative joint disease.  IMPRESSION: Cardiomegaly with early CHF.  Original Report Authenticated By: Staci Righter, M.D.     New CHF  We'll treat with IV nitroglycerin and Lasix.  No evidence of cardiac ischemia.  Troponin is normal.  CRITICAL CARE Performed by: Conchetta Lamia P   Total critical care time: 30 min  Critical care time  was exclusive of separately billable procedures and treating other patients.  Critical care was necessary to treat or prevent imminent or life-threatening deterioration.  Critical care was time spent personally by me on the following activities: development of treatment plan with patient and/or surrogate as well as nursing, discussions with consultants, evaluation of patient's response to treatment, examination of patient, obtaining history from patient or surrogate, ordering and performing treatments and interventions, ordering and review of laboratory studies, ordering and review of radiographic studies, pulse oximetry and re-evaluation of patient's condition.  8:01 PM Spoke with Dr. Marin Comment.  He will come admit the patient.  MDM  CHF With shortness of breath, chest pressure, or lower extremity swelling.  No history of distant past.  Significant hypertension, probably the cause of this treated in the ER with Lasix and nitroglycerin.  ECG, does not show any signs of a STEMI and her troponin is negative.        Elmer Picker, MD 09/17/11 2001

## 2011-09-17 NOTE — Progress Notes (Signed)
ANTICOAGULATION CONSULT NOTE - Initial Consult  Pharmacy Consult for Coumadin Indication: atrial fibrillation  Allergies  Allergen Reactions  . Codeine Nausea Only    Patient Measurements: Height: 4\' 11"  (149.9 cm) Weight: 114 lb 3.2 oz (51.8 kg) IBW/kg (Calculated) : 43.2  Adjusted Body Weight:   Vital Signs: Temp: 97.7 F (36.5 C) (11/15 2134) Temp src: Oral (11/15 2134) BP: 173/66 mmHg (11/15 2134) Pulse Rate: 63  (11/15 2134)  Labs:  Basename 09/17/11 1738 09/16/11  HGB 10.0* --  HCT 30.2* --  PLT 178 --  APTT -- --  LABPROT 21.0* --  INR 1.78* 2.0  HEPARINUNFRC -- --  CREATININE 0.57 --  CKTOTAL 87 --  CKMB 5.2* --  TROPONINI <0.30 --   Estimated Creatinine Clearance: 37.6 ml/min (by C-G formula based on Cr of 0.57).  Medical History: Past Medical History  Diagnosis Date  . Hypertension   . Hypercholesteremia   . Coronary artery disease   . Dementia in Alzheimer's disease with early onset   . Osteoporosis   . Atrial fibrillation   . Arthritis     Medications:  Prescriptions prior to admission  Medication Sig Dispense Refill  . Cholecalciferol (VITAMIN D3) 5000 UNITS CAPS Take by mouth.        . donepezil (ARICEPT) 10 MG tablet Take 10 mg by mouth at bedtime.        Marland Kitchen HYDROcodone-acetaminophen (NORCO) 5-325 MG per tablet Take 1 tablet by mouth every 6 (six) hours as needed. PAIN.      Marland Kitchen lisinopril (PRINIVIL,ZESTRIL) 10 MG tablet Take 10 mg by mouth daily.        . meloxicam (MOBIC) 15 MG tablet Take 15 mg by mouth daily.        . methocarbamol (ROBAXIN) 500 MG tablet Take 500 mg by mouth as needed. MUSCLE SPASM.      Marland Kitchen metoprolol tartrate (LOPRESSOR) 25 MG tablet Take 25 mg by mouth 2 (two) times daily.        . simvastatin (ZOCOR) 40 MG tablet Take 40 mg by mouth at bedtime.        . traMADol (ULTRAM) 50 MG tablet Take 50 mg by mouth every 6 (six) hours as needed. PAIN.      Marland Kitchen warfarin (COUMADIN) 3 MG tablet Take 3 mg by mouth daily. Take 1 tablet  by mouth Monday, Wednesday and Friday and take 1 and 1/2 tablets on Tues, Thurs, Saturday, and Sunday        Assessment: INR 1.79 this pm, to continue Coumadin per pharmacy protocol. Goal of Therapy:  INR 2-3   Plan:  Coumadin 4.5mg  tonight as per usual home dose. Daily INR Monitor H/H, platelets  Gwen Edler L 09/17/2011,10:21 PM

## 2011-09-17 NOTE — H&P (Signed)
PCP:   Mathews Argyle, MD, MD   Chief Complaint:  Shortness of breath and bilateral leg swelling.  HPI: [name] is an 77 y.o. female with history of  atrial fibrillation on anticoagulation, mild dementia, osteoporosis, hypertension and hypercholesterolemia,  presents to the emergency room at [facility] long complaining of chest tightness, shortness of breath, palpitation and bilateral leg swellings.  She had been having these problems for about a week. She denied any chest pain, fever, chills or cough.  Evaluation evaluation in the emergency room shows that she has elevated BNP and a chest x-ray with interstitial edema.  Rewiew of Systems:  The patient denies anorexia, fever, weight loss,, vision loss, decreased hearing, hoarseness, chest pain, syncope,  balance deficits, hemoptysis, abdominal pain, melena, hematochezia, severe indigestion/heartburn, hematuria, incontinence, genital sores, muscle weakness, suspicious skin lesions, transient blindness, difficulty walking, depression, unusual weight change, abnormal bleeding, enlarged lymph nodes, angioedema, and breast masses.    Past Medical History  Diagnosis Date  . Hypertension   . Hypercholesteremia   . Coronary artery disease   . Dementia in Alzheimer's disease with early onset   . Osteoporosis   . Atrial fibrillation   . Arthritis     Past Surgical History  Procedure Date  . Replacement total knee bilateral   . Right shoulder replacement   . Right intertrochanteric hip fracture status post orif August 2012    Medications:  HOME MEDS: Prior to Admission medications   Medication Sig Start Date End Date Taking? Authorizing Provider  Cholecalciferol (VITAMIN D3) 5000 UNITS CAPS Take by mouth.     Yes Historical Provider, MD  donepezil (ARICEPT) 10 MG tablet Take 10 mg by mouth at bedtime.     Yes Historical Provider, MD  HYDROcodone-acetaminophen (NORCO) 5-325 MG per tablet Take 1 tablet by mouth every 6 (six) hours as  needed. PAIN.   Yes Historical Provider, MD  lisinopril (PRINIVIL,ZESTRIL) 10 MG tablet Take 10 mg by mouth daily.     Yes Historical Provider, MD  meloxicam (MOBIC) 15 MG tablet Take 15 mg by mouth daily.     Yes Historical Provider, MD  methocarbamol (ROBAXIN) 500 MG tablet Take 500 mg by mouth as needed. MUSCLE SPASM.   Yes Historical Provider, MD  metoprolol tartrate (LOPRESSOR) 25 MG tablet Take 25 mg by mouth 2 (two) times daily.     Yes Historical Provider, MD  simvastatin (ZOCOR) 40 MG tablet Take 40 mg by mouth at bedtime.     Yes Historical Provider, MD  traMADol (ULTRAM) 50 MG tablet Take 50 mg by mouth every 6 (six) hours as needed. PAIN.   Yes Historical Provider, MD  warfarin (COUMADIN) 3 MG tablet Take 3 mg by mouth daily. Take 1 tablet by mouth Monday, Wednesday and Friday and take 1 and 1/2 tablets on Tues, Thurs, Saturday, and Sunday 09/12/11  Yes Cristopher Peru, MD     Allergies:  Allergies  Allergen Reactions  . Codeine Nausea Only    Social History:   reports that she has never smoked. She has never used smokeless tobacco. She reports that she drinks about .6 ounces of alcohol per week. She reports that she does not use illicit drugs.  Family History: Family History  Problem Relation Age of Onset  . Coronary artery disease       Physical Exam: Filed Vitals:   09/17/11 2040 09/17/11 2045 09/17/11 2050 09/17/11 2134  BP: 172/65 175/63 166/59 173/66  Pulse: 56 55 56 63  Temp:  97.7 F (36.5 C)  TempSrc:    Oral  Resp: 19 13 17 20   Height:    4\' 11"  (1.499 m)  Weight:    51.8 kg (114 lb 3.2 oz)  SpO2: 99% 100% 100% 96%   Blood pressure 173/66, pulse 63, temperature 97.7 F (36.5 C), temperature source Oral, resp. rate 20, height 4\' 11"  (1.499 m), weight 51.8 kg (114 lb 3.2 oz), SpO2 96.00%.  GEN:  Pleasant  person lying in the stretcher in no acute distress; cooperative with exam PSYCH:  alert and oriented x4; does not appear anxious does not appear  depressed; affect is normal HEENT: Mucous membranes pink and anicteric; PERRLA; EOM intact; no cervical lymphadenopathy nor thyromegaly or carotid bruit; no JVD; Breasts:: Not examined CHEST WALL: No tenderness CHEST: Normal respiration, clear to auscultation bilaterally HEART:Irregular rate and rhythm; no murmurs rubs or gallops BACK: No kyphosis or scoliosis; no CVA tenderness ABDOMEN: Obese, soft non-tender; no masses, no organomegaly, normal abdominal bowel sounds; no pannus; no intertriginous candida. Rectal Exam: Not done EXTREMITIES: No bone or joint deformity; age-appropriate arthropathy of the hands and knees; there is 2+ edema bilaterally Genitalia: not examined PULSES: 2+ and symmetric SKIN: Normal hydration no rash or ulceration CNS: Cranial nerves 2-12 grossly intact no focal neurologic deficit   Labs & Imaging Results for orders placed during the hospital encounter of 09/17/11 (from the past 48 hour(s))  CBC     Status: Abnormal   Collection Time   09/17/11  5:38 PM      Component Value Range Comment   WBC 5.9  4.0 - 10.5 (K/uL)    RBC 3.41 (*) 3.87 - 5.11 (MIL/uL)    Hemoglobin 10.0 (*) 12.0 - 15.0 (g/dL)    HCT 30.2 (*) 36.0 - 46.0 (%)    MCV 88.6  78.0 - 100.0 (fL)    MCH 29.3  26.0 - 34.0 (pg)    MCHC 33.1  30.0 - 36.0 (g/dL)    RDW 15.7 (*) 11.5 - 15.5 (%)    Platelets 178  150 - 400 (K/uL)   BASIC METABOLIC PANEL     Status: Abnormal   Collection Time   09/17/11  5:38 PM      Component Value Range Comment   Sodium 132 (*) 135 - 145 (mEq/L)    Potassium 3.9  3.5 - 5.1 (mEq/L)    Chloride 96  96 - 112 (mEq/L)    CO2 25  19 - 32 (mEq/L)    Glucose, Bld 105 (*) 70 - 99 (mg/dL)    BUN 18  6 - 23 (mg/dL)    Creatinine, Ser 0.57  0.50 - 1.10 (mg/dL)    Calcium 9.5  8.4 - 10.5 (mg/dL)    GFR calc non Af Amer 85 (*) >90 (mL/min)    GFR calc Af Amer >90  >90 (mL/min)   PRO B NATRIURETIC PEPTIDE     Status: Abnormal   Collection Time   09/17/11  5:38 PM       Component Value Range Comment   BNP, POC 5109.0 (*) 0 - 450 (pg/mL)   CARDIAC PANEL(CRET KIN+CKTOT+MB+TROPI)     Status: Abnormal   Collection Time   09/17/11  5:38 PM      Component Value Range Comment   Total CK 87  7 - 177 (U/L)    CK, MB 5.2 (*) 0.3 - 4.0 (ng/mL)    Troponin I <0.30  <0.30 (ng/mL)    Relative Index RELATIVE  INDEX IS INVALID  0.0 - 2.5    PROTIME-INR     Status: Abnormal   Collection Time   09/17/11  5:38 PM      Component Value Range Comment   Prothrombin Time 21.0 (*) 11.6 - 15.2 (seconds)    INR 1.78 (*) 0.00 - 1.49    POCT I-STAT TROPONIN I     Status: Normal   Collection Time   09/17/11  6:09 PM      Component Value Range Comment   Troponin i, poc 0.02  0.00 - 0.08 (ng/mL)    Comment 3             Dg Chest 2 View  09/17/2011  *RADIOLOGY REPORT*  Clinical Data: Pressure in chest and extreme shortness of breath. History of atrial fibrillation.  Hypertension.  CHEST - 2 VIEW  Comparison: 06/04/2011.  Findings: Cardiomegaly.  The interstitial and alveolar prominence in the perihilar regions with small bilateral effusions suggest early congestive heart failure.  Worsening aeration compared with priors.  Calcified tortuous aorta.  Right total shoulder replacement.  Severe left shoulder degenerative joint disease.  IMPRESSION: Cardiomegaly with early CHF.  Original Report Authenticated By: Staci Righter, M.D.      Assessment Present on Admission:  .CHF (congestive heart failure), NYHA class II Afib HTN Dementia   PLAN:  This lovely lady presents in congestive heart failure, atrial fibrillation, severe hypertension, and having chest tightness.  He was started on IV nitroglycerin in the emergency room and I will continue. We'll use IV hydralazine for her hypertension. I will admit her to telemetry and rule out with serial CPKs and troponins.  We'll go ahead and get an echo for heart to assess LV function. She is otherwise stable and will be given supplemental  O2. I will diurese her with intravenous furosemide.  He is a full code and will be admitted to hospitalist service..  Other plans as per orders.    Ike Maragh 09/17/2011, 9:55 PM

## 2011-09-17 NOTE — ED Notes (Signed)
Pt states she started to become sob today and felt as if her heart was racing. Pt states she fells dizzy and nauseated

## 2011-09-18 ENCOUNTER — Other Ambulatory Visit: Payer: Self-pay

## 2011-09-18 LAB — PROTIME-INR
INR: 1.82 — ABNORMAL HIGH (ref 0.00–1.49)
Prothrombin Time: 21.4 seconds — ABNORMAL HIGH (ref 11.6–15.2)

## 2011-09-18 LAB — CARDIAC PANEL(CRET KIN+CKTOT+MB+TROPI)
CK, MB: 3.1 ng/mL (ref 0.3–4.0)
Relative Index: INVALID (ref 0.0–2.5)
Relative Index: INVALID (ref 0.0–2.5)
Total CK: 77 U/L (ref 7–177)
Total CK: 68 U/L (ref 7–177)
Troponin I: <0.3 ng/mL (ref <0.30)

## 2011-09-18 LAB — CBC
MCH: 29.1 pg (ref 26.0–34.0)
MCV: 88 fL (ref 78.0–100.0)
Platelets: 185 10*3/uL (ref 150–400)
RBC: 3.57 MIL/uL — ABNORMAL LOW (ref 3.87–5.11)
RDW: 15.5 % (ref 11.5–15.5)

## 2011-09-18 LAB — BASIC METABOLIC PANEL
CO2: 28 mEq/L (ref 19–32)
Calcium: 9.9 mg/dL (ref 8.4–10.5)
Creatinine, Ser: 0.59 mg/dL (ref 0.50–1.10)
GFR calc non Af Amer: 84 mL/min — ABNORMAL LOW (ref >90)
Glucose, Bld: 150 mg/dL — ABNORMAL HIGH (ref 70–99)
Sodium: 136 mEq/L (ref 135–145)

## 2011-09-18 MED ORDER — MORPHINE SULFATE 2 MG/ML IJ SOLN
1.0000 mg | INTRAMUSCULAR | Status: DC | PRN
  Administered 2011-09-18: 1 mg via INTRAVENOUS
  Filled 2011-09-18: qty 2

## 2011-09-18 MED ORDER — POTASSIUM CHLORIDE CRYS ER 20 MEQ PO TBCR
40.0000 meq | EXTENDED_RELEASE_TABLET | Freq: Three times a day (TID) | ORAL | Status: AC
  Administered 2011-09-18 (×3): 40 meq via ORAL
  Filled 2011-09-18 (×3): qty 2

## 2011-09-18 MED ORDER — WARFARIN SODIUM 5 MG PO TABS
5.0000 mg | ORAL_TABLET | Freq: Once | ORAL | Status: AC
  Administered 2011-09-18: 5 mg via ORAL
  Filled 2011-09-18: qty 1

## 2011-09-18 MED ORDER — TEMAZEPAM 15 MG PO CAPS: 30.0000 mg | ORAL_CAPSULE | Freq: Every day | ORAL | Status: DC

## 2011-09-18 MED ORDER — LORAZEPAM 1 MG PO TABS: 1.0000 mg | ORAL_TABLET | Freq: Four times a day (QID) | ORAL | Status: DC | PRN

## 2011-09-18 MED ORDER — ZOLPIDEM TARTRATE 5 MG PO TABS
5.0000 mg | ORAL_TABLET | Freq: Every day | ORAL | Status: DC
  Administered 2011-09-18: 5 mg via ORAL
  Filled 2011-09-18: qty 1

## 2011-09-18 NOTE — Progress Notes (Signed)
Subjective: Patient still relates slightly short of breath. She also relates some chest tenderness which is reproducible by palpation. It is breathing in the room and satting 100% on room air. Not using accessory muscles Objective: Filed Vitals:   09/17/11 2050 09/17/11 2134 09/18/11 0214 09/18/11 0500  BP: 166/59 173/66 159/71 146/68  Pulse: 56 63 79 74  Temp:  97.7 F (36.5 C) 97.9 F (36.6 C) 98.2 F (36.8 C)  TempSrc:  Oral Oral Oral  Resp: 17 20 18 18   Height:  4\' 11"  (1.499 m)    Weight:  51.8 kg (114 lb 3.2 oz)  50.725 kg (111 lb 13.3 oz)  SpO2: 100% 96% 95% 91%   Weight change:   Intake/Output Summary (Last 24 hours) at 09/18/11 1014 Last data filed at 09/18/11 0938  Gross per 24 hour  Intake    480 ml  Output   3953 ml  Net  -3473 ml    General: Alert, awake, oriented x3, in no acute distress.  HEENT: No bruits, no goiter. Not using accessory muscles positive JVD Heart: Regular rate and rhythm, without murmurs, rubs, gallops.  Lungs: Lungs are clear to auscultation with good air movement. Abdomen: Soft, nontender, nondistended, positive bowel sounds.  Neuro: Grossly intact, nonfocal.   Lab Results:  Basename 09/18/11 0001 09/17/11 1738  NA 136 132*  K 3.0* 3.9  CL 98 96  CO2 28 25  GLUCOSE 150* 105*  BUN 15 18  CREATININE 0.59 0.57  CALCIUM 9.9 9.5  MG -- --  PHOS -- --   No results found for this basename: AST:2,ALT:2,ALKPHOS:2,BILITOT:2,PROT:2,ALBUMIN:2 in the last 72 hours No results found for this basename: LIPASE:2,AMYLASE:2 in the last 72 hours  Basename 09/18/11 0001 09/17/11 1738  WBC 6.3 5.9  NEUTROABS -- --  HGB 10.4* 10.0*  HCT 31.4* 30.2*  MCV 88.0 88.6  PLT 185 178    Basename 09/18/11 0820 09/18/11 0001 09/17/11 1738  CKTOTAL 69 77 87  CKMB 3.9 5.2* 5.2*  CKMBINDEX -- -- --  TROPONINI <0.30 <0.30 <0.30    Basename 09/17/11 1738  POCBNP 5109.0*   No results found for this basename: DDIMER:2 in the last 72 hours No results  found for this basename: HGBA1C:2 in the last 72 hours No results found for this basename: CHOL:2,HDL:2,LDLCALC:2,TRIG:2,CHOLHDL:2,LDLDIRECT:2 in the last 72 hours No results found for this basename: TSH,T4TOTAL,FREET3,T3FREE,THYROIDAB in the last 72 hours No results found for this basename: VITAMINB12:2,FOLATE:2,FERRITIN:2,TIBC:2,IRON:2,RETICCTPCT:2 in the last 72 hours  Micro Results: No results found for this or any previous visit (from the past 240 hour(s)).  Studies/Results: Dg Chest 2 View  09/17/2011  *RADIOLOGY REPORT*  Clinical Data: Pressure in chest and extreme shortness of breath. History of atrial fibrillation.  Hypertension.  CHEST - 2 VIEW  Comparison: 06/04/2011.  Findings: Cardiomegaly.  The interstitial and alveolar prominence in the perihilar regions with small bilateral effusions suggest early congestive heart failure.  Worsening aeration compared with priors.  Calcified tortuous aorta.  Right total shoulder replacement.  Severe left shoulder degenerative joint disease.  IMPRESSION: Cardiomegaly with early CHF.  Original Report Authenticated By: Staci Righter, M.D.    Medications: I have reviewed the patient's current medications. Continue Lasix therapy. Also get physical therapy to  Patient Active Problem List  Diagnoses   Acute diastolic HF NYHA II: Patient is breathing on room air and satting 100%. She still have positive JVD she has good air movement and clear to auscultation. Her hepatojugular reflux sign is positive. We'll  restrict her fluids to 1.5 L continue Lasix and monitor electrolytes. Also get a PT consult.  . Atrial fibrillation, paroxysmal: Currently sinus rhythm.   . Hypertension: We'll continue current meds blood pressure borderline high. Titrate lisinopril up   . Dyslipidemia: Continue statin   . Encounter for long-term (current) use of anticoagulants   Hypokalemia replete check a mag .      LOS: 1 day   Charlynne Cousins M.D. Pager:  [phone] [facility] 09/18/2011, 10:14 AM

## 2011-09-18 NOTE — Progress Notes (Signed)
ANTICOAGULATION CONSULT NOTE - Follow Up Consult  Pharmacy Consult for Coumadin Indication: atrial fibrillation  Allergies  Allergen Reactions  . Codeine Nausea Only    Patient Measurements: Height: 4\' 11"  (149.9 cm) Weight: 111 lb 13.3 oz (50.725 kg) IBW/kg (Calculated) : 43.2  Adjusted Body Weight:   Vital Signs: Temp: 98.2 F (36.8 C) (11/16 0500) Temp src: Oral (11/16 0500) BP: 146/68 mmHg (11/16 0500) Pulse Rate: 74  (11/16 0500)  Labs:  Basename 09/18/11 0820 09/18/11 0001 09/17/11 1738 09/16/11  HGB -- 10.4* 10.0* --  HCT -- 31.4* 30.2* --  PLT -- 185 178 --  APTT -- -- -- --  LABPROT -- 21.4* 21.0* --  INR -- 1.82* 1.78* 2.0  HEPARINUNFRC -- -- -- --  CREATININE -- 0.59 0.57 --  CKTOTAL 69 77 87 --  CKMB 3.9 5.2* 5.2* --  TROPONINI <0.30 <0.30 <0.30 --   Estimated Creatinine Clearance: 37.6 ml/min (by C-G formula based on Cr of 0.59).   Medications:  Scheduled:    . aspirin  325 mg Oral STAT  . cholecalciferol  5,000 Units Oral 1 day or 1 dose  . donepezil  10 mg Oral QHS  . enoxaparin  40 mg Subcutaneous Q24H  . furosemide  40 mg Intravenous Once  . furosemide  40 mg Intravenous Q12H  . hydrALAZINE  10 mg Intravenous Once  . lisinopril  10 mg Oral Daily  . metoprolol tartrate  25 mg Oral BID  . simvastatin  40 mg Oral QHS  . sodium chloride  3 mL Intravenous Q12H  . vitamin C  500 mg Oral Daily  . warfarin  4.5 mg Oral Once  . DISCONTD: hydrALAZINE  5 mg Intravenous To ER  . DISCONTD: warfarin  3 mg Oral Daily    Assessment: 77 yo female on chronic anticoag with coumadin 4.5mg  daily for afib presents with subtherapeutic INR (1.82 today, increased from 1.78 last night).  Will increase dose slightly to 5mg  today and recheck INR in am.  Goal of Therapy:  INR 2-3   Plan:  Increase coumadin slightly to 5mg  today and f/u INR in am.   Continue Lovenox 40mg  SQ q24h until INR therapeutic.  Peggyann Juba R 09/18/2011,9:07 AM

## 2011-09-19 LAB — BASIC METABOLIC PANEL
BUN: 20 mg/dL (ref 6–23)
CO2: 31 mEq/L (ref 19–32)
Chloride: 97 mEq/L (ref 96–112)
GFR calc non Af Amer: 77 mL/min — ABNORMAL LOW (ref >90)
Glucose, Bld: 102 mg/dL — ABNORMAL HIGH (ref 70–99)
Potassium: 3.9 mEq/L (ref 3.5–5.1)
Sodium: 135 mEq/L (ref 135–145)

## 2011-09-19 MED ORDER — WARFARIN SODIUM 4 MG PO TABS
4.5000 mg | ORAL_TABLET | Freq: Once | ORAL | Status: AC
  Administered 2011-09-19: 4.5 mg via ORAL
  Filled 2011-09-19: qty 1

## 2011-09-19 MED ORDER — ZOLPIDEM TARTRATE 5 MG PO TABS: 5.0000 mg | ORAL_TABLET | Freq: Every evening | ORAL | Status: DC | PRN

## 2011-09-19 MED ORDER — TEMAZEPAM 15 MG PO CAPS
30.0000 mg | ORAL_CAPSULE | Freq: Every evening | ORAL | Status: DC | PRN
  Administered 2011-09-19: 30 mg via ORAL
  Filled 2011-09-19 (×2): qty 1

## 2011-09-19 MED ORDER — FUROSEMIDE 40 MG PO TABS
40.0000 mg | ORAL_TABLET | Freq: Two times a day (BID) | ORAL | Status: DC
  Administered 2011-09-20: 40 mg via ORAL
  Filled 2011-09-19 (×2): qty 1

## 2011-09-19 MED ORDER — FUROSEMIDE 40 MG PO TABS
40.0000 mg | ORAL_TABLET | Freq: Once | ORAL | Status: AC
  Administered 2011-09-19: 40 mg via ORAL
  Filled 2011-09-19: qty 1

## 2011-09-19 MED ORDER — TEMAZEPAM 15 MG PO CAPS: 30.0000 mg | ORAL_CAPSULE | Freq: Every evening | ORAL | Status: DC | PRN

## 2011-09-19 NOTE — Progress Notes (Signed)
Physical Therapy Evaluation (One time eval) Patient Details Name: [name] MRN: [id number] DOB: 1928/09/25 Today's Date: 09/19/2011 Time: [phone] Charge: EVII  Problem List:  Patient Active Problem List  Diagnoses  . Paroxysmal a-fib  . Hypertension  . Dyslipidemia  . Encounter for long-term (current) use of anticoagulants  . Acute diastolic heart failure, NYHA class 2  . CHF (congestive heart failure), NYHA class II    Past Medical History:  Past Medical History  Diagnosis Date  . Hypertension   . Hypercholesteremia   . Coronary artery disease   . Dementia in Alzheimer's disease with early onset   . Osteoporosis   . Atrial fibrillation   . Arthritis    Past Surgical History:  Past Surgical History  Procedure Date  . Replacement total knee bilateral   . Right shoulder replacement   . Right intertrochanteric hip fracture status post orif August 2012    PT Assessment/Plan/Recommendation PT Assessment Clinical Impression Statement: Pt reports she feels at baseline.  Pt has hx of recent pelvic fx and recommended pt continue to use RW with gait.  Pt agreeable.  Pt reports D/C from HHPT about a week ago and feels comfortable continuing with HHPT created HEP.  No f/u PT recommendations.  Pt plans to have [employer] caregiver 24/7 for a few days upon return home. PT Recommendation/Assessment: Patent does not need any further PT services No Skilled PT: Patient at baseline level of functioning;Patient will have necessary level of assist by caregiver at discharge;Patient is supervision for all activity/mobility PT Goals     PT Evaluation Precautions/Restrictions    Prior Functioning  Home Living Lives With: Alone Receives Help From: Personal care attendant;Friend(s) (friends and 24/7 [employer] program upon return home) Type of Home: House Home Layout: One level Home Access: Stairs to enter Entrance Stairs-Rails: Can reach both Entrance Stairs-Number of Steps:  3 [place]: Wheelchair - manual;Bedside commode/3-in-1;Shower chair with back;Grab bars in shower;Grab bars around toilet;Walker - rolling Prior Function Level of Independence: Requires assistive device for independence;Independent with basic ADLs;Needs assistance with homemaking (uses RW) Light Housekeeping: Minimal Driving: No (not currently) Comments: Recent pelvic fx a month ago and pt reports still using RW and had been receiving HHPT up until a week ago. Cognition Cognition Arousal/Alertness: Awake/alert Overall Cognitive Status: Appears within functional limits for tasks assessed Orientation Level: Oriented X4 Sensation/Coordination   Extremity Assessment RLE Assessment RLE Assessment: Within Functional Limits LLE Assessment LLE Assessment: Within Functional Limits Mobility (including Balance) Transfers Transfers: Yes Sit to Stand: 5: Supervision;With upper extremity assist;From chair/3-in-1 Sit to Stand Details (indicate cue type and reason): Pt donned socks and shoes with set-up prior to mobilizing.  Pt has built in shoe lift for LLD. Stand to Sit: 5: Supervision;With upper extremity assist;To chair/3-in-1 Ambulation/Gait Ambulation/Gait: Yes Ambulation/Gait Assistance: 5: Supervision Ambulation/Gait Assistance Details (indicate cue type and reason): Pt reports ambulation feels the same as PTA.  She reports still having pelvic pain from recent fx and continues to require RW for ambulation. Ambulation Distance (Feet): 160 Feet Assistive device: Rolling walker Gait Pattern: Decreased dorsiflexion - right;Decreased dorsiflexion - left Gait velocity: slow cadence  Balance Balance Assessed:  (No LOB throughout eval.  Pt reports no fall hx.) Exercise    End of Session PT - End of Session Activity Tolerance: Patient tolerated treatment well Patient left: in chair;with call bell in reach (with RN) General Behavior During Session: [facility] for tasks  performed Cognition: [facility] for tasks performed  [facility]  E 09/19/2011, 11:05 AM Pager: [phone]

## 2011-09-19 NOTE — Progress Notes (Signed)
ANTICOAGULATION CONSULT NOTE - Follow Up Consult  Pharmacy Consult for Coumadin Indication: atrial fibrillation  Allergies  Allergen Reactions  . Codeine Nausea Only    Patient Measurements: Height: 4\' 11"  (149.9 cm) Weight: 101 lb 6.6 oz (46 kg) IBW/kg (Calculated) : 43.2  Adjusted Body Weight:   Vital Signs: Temp: 97.9 F (36.6 C) (11/17 1300) Temp src: Oral (11/17 1300) BP: 118/70 mmHg (11/17 1300) Pulse Rate: 64  (11/17 1300)  Labs:  Basename 09/19/11 0537 09/18/11 1613 09/18/11 0820 09/18/11 0001 09/17/11 1738  HGB -- -- -- 10.4* 10.0*  HCT -- -- -- 31.4* 30.2*  PLT -- -- -- 185 178  APTT -- -- -- -- --  LABPROT 25.5* -- -- 21.4* 21.0*  INR 2.28* -- -- 1.82* 1.78*  HEPARINUNFRC -- -- -- -- --  CREATININE 0.76 -- -- 0.59 0.57  CKTOTAL -- 68 69 77 --  CKMB -- 3.1 3.9 5.2* --  TROPONINI -- <0.30 <0.30 <0.30 --   Estimated Creatinine Clearance: 37.6 ml/min (by C-G formula based on Cr of 0.76).   Medications:  Scheduled:     . cholecalciferol  5,000 Units Oral 1 day or 1 dose  . donepezil  10 mg Oral QHS  . furosemide  40 mg Intravenous Q12H  . furosemide  40 mg Oral Once  . furosemide  40 mg Oral BID  . lisinopril  10 mg Oral Daily  . metoprolol tartrate  25 mg Oral BID  . potassium chloride  40 mEq Oral TID  . simvastatin  40 mg Oral QHS  . sodium chloride  3 mL Intravenous Q12H  . vitamin C  500 mg Oral Daily  . warfarin  5 mg Oral ONCE-1800  . zolpidem  5 mg Oral QHS  . DISCONTD: enoxaparin  40 mg Subcutaneous Q24H    Assessment: 77 yo female on chronic anticoag with coumadin 4.5mg  daily for afib PTA. INR now therapeutic (2.28).  Will return to home dose of 4.5 mg daily and f/u AM PT/INR. No bleeding reported.  Goal of Therapy:  INR 2-3   Plan:  Coumadin 4.5 mg today. F/U AM PT/INR. Discontinue lovenox since INR therapeutic.   August Saucer 09/19/2011,4:02 PM

## 2011-09-19 NOTE — Progress Notes (Signed)
Subjective: Patient states that her breathing is much improved, still some leg swelling but also improved. Denies chest pain. Objective: Vital signs in last 24 hours: Temp:  [98 F (36.7 C)-98.7 F (37.1 C)] 98 F (36.7 C) (11/17 0500) Pulse Rate:  [53-69] 69  (11/17 1000) Resp:  [16-18] 18  (11/17 0500) BP: (134-175)/(55-115) 154/71 mmHg (11/17 1000) SpO2:  [97 %] 97 % (11/17 0500) Weight:  [46 kg (101 lb 6.6 oz)] 101 lb 6.6 oz (46 kg) (11/17 0500) Last BM Date: 09/19/11 Intake/Output from previous day: 11/16 0701 - 11/17 0700 In: 88 [P.O.:580] Out: 2403 [Urine:2400; Stool:3] Intake/Output this shift: Total I/O In: 200 [P.O.:200] Out: -     General Appearance:    Alert, cooperative, no distress, appears stated age  Lungs:     Clear to auscultation bilaterally, respirations unlabored   Heart:    Regular rate and rhythm, S1 and S2 normal, no murmur, rub   or gallop  Abdomen:     Soft, non-tender, bowel sounds active all four quadrants,    no masses, no organomegaly  Extremities:   trace to +1 edema, L >R   Neurologic:   CNII-XII intact, normal strength.       Weight change: -6.164 kg (-13 lb 9.4 oz)  Intake/Output Summary (Last 24 hours) at 09/19/11 1152 Last data filed at 09/19/11 1100  Gross per 24 hour  Intake    540 ml  Output   2402 ml  Net  -1862 ml    Lab Results:   [facility] 09/19/11 0537 09/18/11 0001  NA 135 136  K 3.9 3.0*  CL 97 98  CO2 31 28  GLUCOSE 102* 150*  BUN 20 15  CREATININE 0.76 0.59  CALCIUM 9.9 9.9    Basename 09/18/11 0001 09/17/11 1738  WBC 6.3 5.9  HGB 10.4* 10.0*  HCT 31.4* 30.2*  PLT 185 178  MCV 88.0 88.6   PT/INR  Basename 09/19/11 0537 09/18/11 0001  LABPROT 25.5* 21.4*  INR 2.28* 1.82*   Mg - 1.7  ABG No results found for this basename: PHART:2,PCO2:2,PO2:2,HCO3:2 in the last 72 hours  Micro Results: No results found for this or any previous visit (from the past 240 hour(s)). Studies/Results: Dg Chest 2  View  09/17/2011  *RADIOLOGY REPORT*  Clinical Data: Pressure in chest and extreme shortness of breath. History of atrial fibrillation.  Hypertension.  CHEST - 2 VIEW  Comparison: 06/04/2011.  Findings: Cardiomegaly.  The interstitial and alveolar prominence in the perihilar regions with small bilateral effusions suggest early congestive heart failure.  Worsening aeration compared with priors.  Calcified tortuous aorta.  Right total shoulder replacement.  Severe left shoulder degenerative joint disease.  IMPRESSION: Cardiomegaly with early CHF.  Original Report Authenticated By: Staci Righter, M.D.   Medications:  Scheduled Meds:   . cholecalciferol  5,000 Units Oral 1 day or 1 dose  . donepezil  10 mg Oral QHS  . enoxaparin  40 mg Subcutaneous Q24H  . furosemide  40 mg Intravenous Q12H  . furosemide  40 mg Oral Once  . furosemide  40 mg Oral BID  . lisinopril  10 mg Oral Daily  . metoprolol tartrate  25 mg Oral BID  . potassium chloride  40 mEq Oral TID  . simvastatin  40 mg Oral QHS  . sodium chloride  3 mL Intravenous Q12H  . vitamin C  500 mg Oral Daily  . warfarin  5 mg Oral ONCE-1800  . zolpidem  5 mg Oral QHS  . DISCONTD: temazepam  30 mg Oral QHS   Continuous Infusions:   . sodium chloride     PRN Meds:.HYDROcodone-acetaminophen, LORazepam, morphine injection, sodium chloride, sodium chloride, traMADol Assessment/Plan:  Acute diastolic CHF- improving, patient had an echocardiogram done 08/15/2000 revealed an ejection fraction of 50-55% with Doppler Parameters consistent with abnormal left ventricular and relaxation are, grade 1 diastolic dysfunction. Will complete the scheduled doses of IV Lasix and today and transition to oral Lasix. Recheck a brain natruretic peptide in the morning  .  Atrial fibrillation, paroxysmal: Currently sinus rhythm, INR therapeutic  .  Hypertension: Continue current medications.  .  Dyslipidemia: Continue statin   Hypokalemia-resolved,  magnesium level within normal limits  Patient at baseline per PT plan DC soon if continues to improve.  LOS: 2 days    Prima Rayner C 09/19/2011, 11:52 AM

## 2011-09-20 LAB — BASIC METABOLIC PANEL
BUN: 30 mg/dL — ABNORMAL HIGH (ref 6–23)
Calcium: 9.6 mg/dL (ref 8.4–10.5)
GFR calc non Af Amer: 58 mL/min — ABNORMAL LOW (ref >90)
Glucose, Bld: 108 mg/dL — ABNORMAL HIGH (ref 70–99)

## 2011-09-20 LAB — PROTIME-INR
INR: 2.67 — ABNORMAL HIGH (ref 0.00–1.49)
Prothrombin Time: 28.9 seconds — ABNORMAL HIGH (ref 11.6–15.2)

## 2011-09-20 MED ORDER — FUROSEMIDE 40 MG PO TABS: 40.0000 mg | ORAL_TABLET | Freq: Every day | ORAL | Status: DC

## 2011-09-20 MED ORDER — POTASSIUM CHLORIDE ER 10 MEQ PO TBCR: 20.0000 meq | EXTENDED_RELEASE_TABLET | Freq: Every day | ORAL | Status: DC

## 2011-09-20 MED ORDER — WARFARIN SODIUM 4 MG PO TABS
4.5000 mg | ORAL_TABLET | Freq: Once | ORAL | Status: DC
  Filled 2011-09-20: qty 1

## 2011-09-20 MED ORDER — ASCORBIC ACID 500 MG PO TABS: 500.0000 mg | ORAL_TABLET | Freq: Every day | ORAL | Status: DC

## 2011-09-20 NOTE — Discharge Summary (Signed)
Name: [name] MRN: [id number] DOB: 05-24-1928 77 y.o.  Date of Admission: 09/17/2011  4:41 PM Date of Discharge: 09/20/2011 Attending Physician: Sheila Oats  Discharge Diagnosis: Principal Problem:  *Acute diastolic heart failure, NYHA class 2 Active Problems:  Paroxysmal a-fib  Hypertension  Dyslipidemia  Hypokalemia-resolved  Discharge Medications: Current Discharge Medication List    START taking these medications   Details  furosemide (LASIX) 40 MG tablet Take 1 tablet (40 mg total) by mouth daily. Qty: 30 tablet, Refills: 0    potassium chloride (K-DUR) 10 MEQ tablet Take 2 tablets (20 mEq total) by mouth daily. Qty: 30 tablet, Refills: 0      CONTINUE these medications which have CHANGED   Details  vitamin C (VITAMIN C) 500 MG tablet Take 1 tablet (500 mg total) by mouth daily. Qty: 30 tablet, Refills: 0      CONTINUE these medications which have NOT CHANGED   Details  Cholecalciferol (VITAMIN D3) 5000 UNITS CAPS Take by mouth.      donepezil (ARICEPT) 10 MG tablet Take 10 mg by mouth at bedtime.      HYDROcodone-acetaminophen (NORCO) 5-325 MG per tablet Take 1 tablet by mouth every 6 (six) hours as needed. PAIN.    lisinopril (PRINIVIL,ZESTRIL) 10 MG tablet Take 10 mg by mouth daily.      methocarbamol (ROBAXIN) 500 MG tablet Take 500 mg by mouth as needed. MUSCLE SPASM.    metoprolol tartrate (LOPRESSOR) 25 MG tablet Take 25 mg by mouth 2 (two) times daily.      simvastatin (ZOCOR) 40 MG tablet Take 40 mg by mouth at bedtime.      traMADol (ULTRAM) 50 MG tablet Take 50 mg by mouth every 6 (six) hours as needed. PAIN.    warfarin (COUMADIN) 3 MG tablet Take 3 mg by mouth daily. Take 1 tablet by mouth Monday, Wednesday and Friday and take 1 and 1/2 tablets on Tues, Thurs, Saturday, and Sunday      STOP taking these medications     meloxicam (MOBIC) 15 MG tablet         Disposition and follow-up:   Ms.Storm W Bonam was discharged from [facility] in improved condition.    Follow-up Appointments:  Dr. Lajean Manes- this week, call for appointment. Discharge Orders    Future Appointments: Provider: Department: Dept Phone: Center:   10/13/2011 2:45 PM Cristopher Peru, [place] [phone] [id number]     Future Orders Please Complete By Expires   Diet - low sodium heart healthy      Scheduling Instructions:   1500cc fluid restriction.   Increase activity slowly         Consultations: None  Procedures Performed:  Dg Chest 2 View  09/17/2011  *RADIOLOGY REPORT*  Clinical Data: Pressure in chest and extreme shortness of breath. History of atrial fibrillation.  Hypertension.  CHEST - 2 VIEW  Comparison: 06/04/2011.  Findings: Cardiomegaly.  The interstitial and alveolar prominence in the perihilar regions with small bilateral effusions suggest early congestive heart failure.  Worsening aeration compared with priors.  Calcified tortuous aorta.  Right total shoulder replacement.  Severe left shoulder degenerative joint disease.  IMPRESSION: Cardiomegaly with early CHF.  Original Report Authenticated By: Staci Righter, M.D.    2-D echocardiogram- done 08/16/2011, with ejection fraction of 50-55% and Doppler. parameters consistent with abnormal left ventricular relaxation, and grade 1 diastolic dysfunction.  Brief history SELESTINE [name] is an 77 y.o.  female with history of atrial fibrillation on anticoagulation, mild dementia, osteoporosis, hypertension and hypercholesterolemia, presents to the emergency room at [facility] long complaining of chest tightness, shortness of breath, palpitation and bilateral leg swellings. She had been having these problems for about a week. She denied any chest pain, fever, chills or cough. Evaluation evaluation in the emergency room shows that she has elevated BNP and a chest x-ray with interstitial edema.    Hospital Course by problem list:  1.Acute diastolic CHF-upon  admission the patient had a chest x-ray which revealed early CHF, a brain natruretic peptide was elevated at 5109 and she was started on IV Lasix for diuresis. Cardiac enzymes were cycled and came back negative. The etiologies for her diastolic CHF are the paroxysmal intrafibrillation and hypertension. Patient responded well to diureses and her brain natruretic peptide this morning is down to 838.3, she has loss of from 52 kg on admission down to 47 kg today. She is clinically much improved and will be discharged on oral Lasix and is to follow up outpatient with Dr. Felipa Eth. She was maintained on her beta blocker as well as lisinopril which she is to continue upon discharge. Her PCP to continue monitoring her fluid status and renal function and further adjust her diuretic doses as appropriate. 2.Atrial fibrillation, paroxysmal: She was in sinus rhythm during this hospital stay, she was maintained on her beta blockers and Coumadin. Her INR is remaining therapeutic - 2.67 upon discharge today. She is to follow up with Dr. Felipa Eth for PT/INR this week as directed 3.Hypertension: She was maintained on her outpatient medications during this hospital stay the  4.Dyslipidemia: She is to continue outpatient medications upon discharge  5.Hypokalemia-her potassium was replaced during this hospital stay.  Patient was evaluated by physical therapy during this hospital stay and they indicated that she was at her baseline and no further skilled  PT was recommended.    Discharge Vitals:  BP 128/73  Pulse 60  Temp(Src) 98.2 F (36.8 C) (Oral)  Resp 18  Ht 4\' 11"  (1.499 m)  Wt 47.219 kg (104 lb 1.6 oz)  BMI 21.03 kg/m2  SpO2 95%  Discharge Labs:  Results for orders placed during the hospital encounter of 09/17/11 (from the past 24 hour(s))  PROTIME-INR     Status: Abnormal   Collection Time   09/20/11  6:00 AM      Component Value Range   Prothrombin Time 28.9 (*) 11.6 - 15.2 (seconds)   INR 2.67 (*)  0.00 - 1.49   PRO B NATRIURETIC PEPTIDE     Status: Abnormal   Collection Time   09/20/11  6:00 AM      Component Value Range   BNP, POC 838.3 (*) 0 - 450 (pg/mL)  BASIC METABOLIC PANEL     Status: Abnormal   Collection Time   09/20/11  6:00 AM      Component Value Range   Sodium 134 (*) 135 - 145 (mEq/L)   Potassium 4.2  3.5 - 5.1 (mEq/L)   Chloride 96  96 - 112 (mEq/L)   CO2 32  19 - 32 (mEq/L)   Glucose, Bld 108 (*) 70 - 99 (mg/dL)   BUN 30 (*) 6 - 23 (mg/dL)   Creatinine, Ser 0.90  0.50 - 1.10 (mg/dL)   Calcium 9.6  8.4 - 10.5 (mg/dL)   GFR calc non Af Amer 58 (*) >90 (mL/min)   GFR calc Af Amer 68 (*) >90 (mL/min)    Signed: Minette Headland  C 09/20/2011, 11:58 AM

## 2011-09-20 NOTE — Progress Notes (Signed)
ANTICOAGULATION CONSULT NOTE - Follow Up Consult  Pharmacy Consult for Coumadin Indication: atrial fibrillation  Allergies  Allergen Reactions  . Codeine Nausea Only    Patient Measurements: Height: 4\' 11"  (149.9 cm) Weight: 104 lb 1.6 oz (47.219 kg) IBW/kg (Calculated) : 43.2  Adjusted Body Weight:   Vital Signs: Temp: 98.2 F (36.8 C) (11/18 0643) Temp src: Oral (11/18 0643) BP: 128/73 mmHg (11/18 0643) Pulse Rate: 60  (11/18 0643)  Labs:  Basename 09/20/11 0600 09/19/11 0537 09/18/11 1613 09/18/11 0820 09/18/11 0001 09/17/11 1738  HGB -- -- -- -- 10.4* 10.0*  HCT -- -- -- -- 31.4* 30.2*  PLT -- -- -- -- 185 178  APTT -- -- -- -- -- --  LABPROT 28.9* 25.5* -- -- 21.4* --  INR 2.67* 2.28* -- -- 1.82* --  HEPARINUNFRC -- -- -- -- -- --  CREATININE 0.90 0.76 -- -- 0.59 --  CKTOTAL -- -- 68 69 77 --  CKMB -- -- 3.1 3.9 5.2* --  TROPONINI -- -- <0.30 <0.30 <0.30 --   Estimated Creatinine Clearance: 33.4 ml/min (by C-G formula based on Cr of 0.9).   Medications:  Scheduled:     . cholecalciferol  5,000 Units Oral 1 day or 1 dose  . donepezil  10 mg Oral QHS  . furosemide  40 mg Intravenous Q12H  . furosemide  40 mg Oral Once  . furosemide  40 mg Oral BID  . lisinopril  10 mg Oral Daily  . metoprolol tartrate  25 mg Oral BID  . simvastatin  40 mg Oral QHS  . sodium chloride  3 mL Intravenous Q12H  . vitamin C  500 mg Oral Daily  . warfarin  4.5 mg Oral ONCE-1800  . DISCONTD: enoxaparin  40 mg Subcutaneous Q24H  . DISCONTD: zolpidem  5 mg Oral QHS    Assessment: 77 yo female on chronic anticoag with coumadin 4.5mg  daily for afib PTA. INR now therapeutic (2.67), and increasing steadily (1.78 --> 1.82 --> 2.28 --> 2.67). No bleeding reported.  Goal of Therapy:  INR 2-3   Plan:  Will repeat Coumadin 4.5 mg today and if INR continues to increase will decrease dose.  F/U AM PT/INR.   Shakeera Rightmyer Sameh 09/20/2011,11:49 AM

## 2011-09-22 ENCOUNTER — Ambulatory Visit (INDEPENDENT_AMBULATORY_CARE_PROVIDER_SITE_OTHER): Payer: Self-pay | Admitting: Internal Medicine

## 2011-09-22 DIAGNOSIS — IMO0001 Reserved for inherently not codable concepts without codable children: Secondary | ICD-10-CM | POA: Diagnosis not present

## 2011-09-22 DIAGNOSIS — I509 Heart failure, unspecified: Secondary | ICD-10-CM | POA: Diagnosis not present

## 2011-09-22 DIAGNOSIS — I480 Paroxysmal atrial fibrillation: Secondary | ICD-10-CM

## 2011-09-22 DIAGNOSIS — F0390 Unspecified dementia without behavioral disturbance: Secondary | ICD-10-CM | POA: Diagnosis not present

## 2011-09-22 DIAGNOSIS — S72009D Fracture of unspecified part of neck of unspecified femur, subsequent encounter for closed fracture with routine healing: Secondary | ICD-10-CM | POA: Diagnosis not present

## 2011-09-22 DIAGNOSIS — R0989 Other specified symptoms and signs involving the circulatory and respiratory systems: Secondary | ICD-10-CM

## 2011-09-22 DIAGNOSIS — I10 Essential (primary) hypertension: Secondary | ICD-10-CM | POA: Diagnosis not present

## 2011-09-22 DIAGNOSIS — I4891 Unspecified atrial fibrillation: Secondary | ICD-10-CM | POA: Diagnosis not present

## 2011-09-22 DIAGNOSIS — Z7901 Long term (current) use of anticoagulants: Secondary | ICD-10-CM

## 2011-09-23 NOTE — Progress Notes (Signed)
09/23/11 UR CHART REVIEWED.

## 2011-09-29 ENCOUNTER — Ambulatory Visit (INDEPENDENT_AMBULATORY_CARE_PROVIDER_SITE_OTHER): Payer: Self-pay | Admitting: Cardiology

## 2011-09-29 DIAGNOSIS — I509 Heart failure, unspecified: Secondary | ICD-10-CM | POA: Diagnosis not present

## 2011-09-29 DIAGNOSIS — I10 Essential (primary) hypertension: Secondary | ICD-10-CM | POA: Diagnosis not present

## 2011-09-29 DIAGNOSIS — I480 Paroxysmal atrial fibrillation: Secondary | ICD-10-CM

## 2011-09-29 DIAGNOSIS — I4891 Unspecified atrial fibrillation: Secondary | ICD-10-CM | POA: Diagnosis not present

## 2011-09-29 DIAGNOSIS — S72009D Fracture of unspecified part of neck of unspecified femur, subsequent encounter for closed fracture with routine healing: Secondary | ICD-10-CM | POA: Diagnosis not present

## 2011-09-29 DIAGNOSIS — Z7901 Long term (current) use of anticoagulants: Secondary | ICD-10-CM

## 2011-09-29 DIAGNOSIS — R0989 Other specified symptoms and signs involving the circulatory and respiratory systems: Secondary | ICD-10-CM

## 2011-09-29 DIAGNOSIS — IMO0001 Reserved for inherently not codable concepts without codable children: Secondary | ICD-10-CM | POA: Diagnosis not present

## 2011-09-29 DIAGNOSIS — F0390 Unspecified dementia without behavioral disturbance: Secondary | ICD-10-CM | POA: Diagnosis not present

## 2011-10-06 ENCOUNTER — Ambulatory Visit (INDEPENDENT_AMBULATORY_CARE_PROVIDER_SITE_OTHER): Payer: Self-pay | Admitting: Cardiology

## 2011-10-06 DIAGNOSIS — I4891 Unspecified atrial fibrillation: Secondary | ICD-10-CM | POA: Diagnosis not present

## 2011-10-06 DIAGNOSIS — I10 Essential (primary) hypertension: Secondary | ICD-10-CM | POA: Diagnosis not present

## 2011-10-06 DIAGNOSIS — F0390 Unspecified dementia without behavioral disturbance: Secondary | ICD-10-CM | POA: Diagnosis not present

## 2011-10-06 DIAGNOSIS — IMO0001 Reserved for inherently not codable concepts without codable children: Secondary | ICD-10-CM | POA: Diagnosis not present

## 2011-10-06 DIAGNOSIS — S72009D Fracture of unspecified part of neck of unspecified femur, subsequent encounter for closed fracture with routine healing: Secondary | ICD-10-CM | POA: Diagnosis not present

## 2011-10-06 DIAGNOSIS — I509 Heart failure, unspecified: Secondary | ICD-10-CM | POA: Diagnosis not present

## 2011-10-06 DIAGNOSIS — R0989 Other specified symptoms and signs involving the circulatory and respiratory systems: Secondary | ICD-10-CM

## 2011-10-06 DIAGNOSIS — I480 Paroxysmal atrial fibrillation: Secondary | ICD-10-CM

## 2011-10-06 DIAGNOSIS — Z7901 Long term (current) use of anticoagulants: Secondary | ICD-10-CM

## 2011-10-09 DIAGNOSIS — I509 Heart failure, unspecified: Secondary | ICD-10-CM | POA: Diagnosis not present

## 2011-10-09 DIAGNOSIS — S72009D Fracture of unspecified part of neck of unspecified femur, subsequent encounter for closed fracture with routine healing: Secondary | ICD-10-CM | POA: Diagnosis not present

## 2011-10-09 DIAGNOSIS — IMO0001 Reserved for inherently not codable concepts without codable children: Secondary | ICD-10-CM | POA: Diagnosis not present

## 2011-10-09 DIAGNOSIS — F0390 Unspecified dementia without behavioral disturbance: Secondary | ICD-10-CM | POA: Diagnosis not present

## 2011-10-09 DIAGNOSIS — I10 Essential (primary) hypertension: Secondary | ICD-10-CM | POA: Diagnosis not present

## 2011-10-09 DIAGNOSIS — I4891 Unspecified atrial fibrillation: Secondary | ICD-10-CM | POA: Diagnosis not present

## 2011-10-13 ENCOUNTER — Encounter: Payer: Self-pay | Admitting: Internal Medicine

## 2011-10-13 ENCOUNTER — Ambulatory Visit (INDEPENDENT_AMBULATORY_CARE_PROVIDER_SITE_OTHER): Payer: Medicare Other | Admitting: Internal Medicine

## 2011-10-13 DIAGNOSIS — I5031 Acute diastolic (congestive) heart failure: Secondary | ICD-10-CM

## 2011-10-13 DIAGNOSIS — M129 Arthropathy, unspecified: Secondary | ICD-10-CM

## 2011-10-13 DIAGNOSIS — M1990 Unspecified osteoarthritis, unspecified site: Secondary | ICD-10-CM | POA: Insufficient documentation

## 2011-10-13 DIAGNOSIS — I10 Essential (primary) hypertension: Secondary | ICD-10-CM

## 2011-10-13 MED ORDER — FUROSEMIDE 20 MG PO TABS: 20.0000 mg | ORAL_TABLET | Freq: Every day | ORAL | Status: DC

## 2011-10-13 NOTE — Assessment & Plan Note (Signed)
Blood pressure is well-controlled. She will continue a low sodium diet.

## 2011-10-13 NOTE — Assessment & Plan Note (Signed)
Her symptoms are under control at the present time. We discussed the ways to prevent additional arthritis flares from gout. We plan to reduce her diuretic dose.

## 2011-10-13 NOTE — Progress Notes (Signed)
HPI [name] returns today for followup. She is a very pleasant elderly woman with a history of paroxysmal atrial fibrillation who was maintained very nicely in sinus rhythm on amiodarone. She unfortunately developed side effects from this drug was stopped. She presented to the hospital in November with acute congestive heart failure due to diastolic dysfunction. She was diuresed and sent home. She has developed a gouty arthritis flare her left wrist. This too has resolved. She currently denies chest pain or shortness of breath or peripheral edema. No syncope. Allergies  Allergen Reactions  . Codeine Nausea Only     Current Outpatient Prescriptions  Medication Sig Dispense Refill  . donepezil (ARICEPT) 10 MG tablet Take 10 mg by mouth at bedtime.        . furosemide (LASIX) 40 MG tablet Take 1 tablet (40 mg total) by mouth daily.  30 tablet  0  . potassium chloride (K-DUR) 10 MEQ tablet Take 2 tablets (20 mEq total) by mouth daily.  30 tablet  0  . simvastatin (ZOCOR) 20 MG tablet Take 20 mg by mouth at bedtime.        . temazepam (RESTORIL) 30 MG capsule Take 30 mg by mouth at bedtime as needed.        . warfarin (COUMADIN) 3 MG tablet Take 3 mg by mouth daily. Take 1 tablet by mouth Monday, Wednesday and Friday and take 1 and 1/2 tablets on Tues, Thurs, Saturday, and Sunday         Past Medical History  Diagnosis Date  . Hypertension   . Hypercholesteremia   . Coronary artery disease   . Dementia in Alzheimer's disease with early onset   . Osteoporosis   . Atrial fibrillation   . Arthritis     ROS:   All systems reviewed and negative except as noted in the HPI.   Past Surgical History  Procedure Date  . Replacement total knee bilateral   . Right shoulder replacement   . Right intertrochanteric hip fracture status post orif August 2012     Family History  Problem Relation Age of Onset  . Coronary artery disease       History   Social History  . Marital Status:  Widowed    Spouse Name: N/A    Number of Children: N/A  . Years of Education: N/A   Occupational History  . Not on file.   Social History Main Topics  . Smoking status: Never Smoker   . Smokeless tobacco: Never Used  . Alcohol Use: 0.6 oz/week    1 Glasses of wine per week     1 glass wine with dinner some days  . Drug Use: No  . Sexually Active: Not on file   Other Topics Concern  . Not on file   Social History Narrative  . No narrative on file     BP 118/52  Pulse 55  Ht 4\' 11"  (1.499 m)  Wt 46.63 kg (102 lb 12.8 oz)  BMI 20.76 kg/m2  Physical Exam:  Well appearing elderly woman, NAD HEENT: Unremarkable Neck:  No JVD, no thyromegally Lymphatics:  No adenopathy Back:  No CVA tenderness Lungs:  Clear with no wheezes, rales, or rhonchi. HEART:  Regular rate rhythm, no murmurs, no rubs, no clicks Abd:  soft, positive bowel sounds, no organomegally, no rebound, no guarding Ext:  2 plus pulses, no edema, no cyanosis, no clubbing Skin:  No rashes no nodules Neuro:  CN II through XII intact,  motor grossly intact   Assess/Plan:

## 2011-10-13 NOTE — Patient Instructions (Signed)
Your physician recommends that you schedule a follow-up appointment in: 3 months with Dr Lovena Le  Your physician has recommended you make the following change in your medication:  1) Start Furosemide 20mg  daily      If weight greater than 105 take 40mg        If weight less than 100 take NO Furosemide that day 2) Okay to restart Celebrex      None if weight is greater than 105   2,000cc fluid restriction daily  2 Gram Low Sodium Diet A 2 gram sodium diet restricts the amount of sodium in the diet to no more than 2 g or 2000 mg daily. Limiting the amount of sodium is often used to help lower blood pressure. It is important if you have heart, liver, or kidney problems. Many foods contain sodium for flavor and sometimes as a preservative. When the amount of sodium in a diet needs to be low, it is important to know what to look for when choosing foods and drinks. The following includes some information and guidelines to help make it easier for you to adapt to a low sodium diet. QUICK TIPS  Do not add salt to food.   Avoid convenience items and fast food.   Choose unsalted snack foods.   Buy lower sodium products, often labeled as "lower sodium" or "no salt added."   Check food labels to learn how much sodium is in 1 serving.   When eating at a restaurant, ask that your food be prepared with less salt or none, if possible.  READING FOOD LABELS FOR SODIUM INFORMATION The nutrition facts label is a good place to find how much sodium is in foods. Look for products with no more than 500 to 600 mg of sodium per meal and no more than 150 mg per serving. Remember that 2 g = 2000 mg. The food label may also list foods as:  Sodium-free: Less than 5 mg in a serving.   Very low sodium: 35 mg or less in a serving.   Low-sodium: 140 mg or less in a serving.   Light in sodium: 50% less sodium in a serving. For example, if a food that usually has 300 mg of sodium is changed to become light in sodium,  it will have 150 mg of sodium.   Reduced sodium: 25% less sodium in a serving. For example, if a food that usually has 400 mg of sodium is changed to reduced sodium, it will have 300 mg of sodium.  CHOOSING FOODS Grains  Avoid: Salted crackers and snack items. Some cereals, including instant hot cereals. Bread stuffing and biscuit mixes. Seasoned rice or pasta mixes.   Choose: Unsalted snack items. Low-sodium cereals, oats, puffed wheat and rice, shredded wheat. English muffins and bread. Pasta.  Meats  Avoid: Salted, canned, smoked, spiced, pickled meats, including fish and poultry. Bacon, ham, sausage, cold cuts, hot dogs, anchovies.   Choose: Low-sodium canned tuna and salmon. Fresh or frozen meat, poultry, and fish.  Dairy  Avoid: Processed cheese and spreads. Cottage cheese. Buttermilk and condensed milk. Regular cheese.   Choose: Milk. Low-sodium cottage cheese. Yogurt. Sour cream. Low-sodium cheese.  Fruits and Vegetables  Avoid: Regular canned vegetables. Regular canned tomato sauce and paste. Frozen vegetables in sauces. Olives. Angie Fava. Relishes. Sauerkraut.   Choose: Low-sodium canned vegetables. Low-sodium tomato sauce and paste. Frozen or fresh vegetables. Fresh and frozen fruit.  Condiments  Avoid: Canned and packaged gravies. Worcestershire sauce. Tartar sauce. Barbecue sauce.  Soy sauce. Steak sauce. Ketchup. Onion, garlic, and table salt. Meat flavorings and tenderizers.   Choose: Fresh and dried herbs and spices. Low-sodium varieties of mustard and ketchup. Lemon juice. Tabasco sauce. Horseradish.  SAMPLE 2 GRAM SODIUM MEAL PLAN Breakfast / Sodium (mg)  1 cup low-fat milk / [id number] mg   2 slices whole-wheat toast / 270 mg   1 tbs heart-healthy margarine / 153 mg   1 hard-boiled egg / 139 mg   1 small orange / 0 mg  Lunch / Sodium (mg)  1 cup raw carrots / 76 mg    cup hummus / 298 mg   1 cup low-fat milk / 143 mg    cup red grapes / 2 mg   1  whole-wheat pita bread / 356 mg  Dinner / Sodium (mg)  1 cup whole-wheat pasta / 2 mg   1 cup low-sodium tomato sauce / 73 mg   3 oz lean ground beef / 57 mg   1 small side salad (1 cup raw spinach leaves,  cup cucumber,  cup yellow bell pepper) with 1 tsp olive oil and 1 tsp red wine vinegar / 25 mg  Snack / Sodium (mg)  1 container low-fat vanilla yogurt / 107 mg   3 graham cracker squares / 127 mg  Nutrient Analysis  Calories: 2033   Protein: 77 g   Carbohydrate: 282 g   Fat: 72 g   Sodium: 1971 mg  Document Released: 10/19/2005 Document Revised: 07/01/2011 Document Reviewed: 01/20/2010 [facility] Patient Information 2012 [place], [place].

## 2011-10-13 NOTE — Assessment & Plan Note (Signed)
Her symptoms are currently well controlled. She will continue her medications but I have asked that she decrease her diuretic dose today. She will maintain a low-sodium diet.

## 2011-10-16 ENCOUNTER — Ambulatory Visit (INDEPENDENT_AMBULATORY_CARE_PROVIDER_SITE_OTHER): Payer: Self-pay | Admitting: Cardiology

## 2011-10-16 DIAGNOSIS — I4891 Unspecified atrial fibrillation: Secondary | ICD-10-CM | POA: Diagnosis not present

## 2011-10-16 DIAGNOSIS — S72009D Fracture of unspecified part of neck of unspecified femur, subsequent encounter for closed fracture with routine healing: Secondary | ICD-10-CM | POA: Diagnosis not present

## 2011-10-16 DIAGNOSIS — Z7901 Long term (current) use of anticoagulants: Secondary | ICD-10-CM

## 2011-10-16 DIAGNOSIS — R0989 Other specified symptoms and signs involving the circulatory and respiratory systems: Secondary | ICD-10-CM

## 2011-10-16 DIAGNOSIS — I509 Heart failure, unspecified: Secondary | ICD-10-CM | POA: Diagnosis not present

## 2011-10-16 DIAGNOSIS — IMO0001 Reserved for inherently not codable concepts without codable children: Secondary | ICD-10-CM | POA: Diagnosis not present

## 2011-10-16 DIAGNOSIS — I10 Essential (primary) hypertension: Secondary | ICD-10-CM | POA: Diagnosis not present

## 2011-10-16 DIAGNOSIS — F0390 Unspecified dementia without behavioral disturbance: Secondary | ICD-10-CM | POA: Diagnosis not present

## 2011-10-16 DIAGNOSIS — I480 Paroxysmal atrial fibrillation: Secondary | ICD-10-CM

## 2011-10-19 ENCOUNTER — Telehealth: Payer: Self-pay | Admitting: Internal Medicine

## 2011-10-19 NOTE — Telephone Encounter (Signed)
New msg Pt said she has not received rx for celebrex  [facility] [address]

## 2011-10-20 ENCOUNTER — Telehealth: Payer: Self-pay | Admitting: Internal Medicine

## 2011-10-20 NOTE — Telephone Encounter (Signed)
[name], could you tell me if Dr. Lovena Le was actually going to fill this medication for her (CELEBREX), I know he said okay to RESTART, but no Rx was ever sent in and patient keeps calling saying that we are not sending her Rx in to the pharmacy after multiple requests.  Please advise.  Thanks  [employer]

## 2011-10-20 NOTE — Telephone Encounter (Signed)
Pt still has not received rx for celebrex, said it was to be called in at last office visit last week, and she called yesterday, she doesn't drive and is depending on someone else to pick it up and they will be going back at noon today, can it be called in asap?

## 2011-10-20 NOTE — Telephone Encounter (Signed)
No he was not going to fill  She needs to get it from whom ever started it

## 2011-10-20 NOTE — Telephone Encounter (Signed)
celebrex was to be called in at last office visit, uses [facility] [address]

## 2011-10-21 DIAGNOSIS — I509 Heart failure, unspecified: Secondary | ICD-10-CM | POA: Diagnosis not present

## 2011-10-21 DIAGNOSIS — I4891 Unspecified atrial fibrillation: Secondary | ICD-10-CM | POA: Diagnosis not present

## 2011-10-21 DIAGNOSIS — S72009D Fracture of unspecified part of neck of unspecified femur, subsequent encounter for closed fracture with routine healing: Secondary | ICD-10-CM | POA: Diagnosis not present

## 2011-10-21 DIAGNOSIS — F0390 Unspecified dementia without behavioral disturbance: Secondary | ICD-10-CM | POA: Diagnosis not present

## 2011-10-21 DIAGNOSIS — I10 Essential (primary) hypertension: Secondary | ICD-10-CM | POA: Diagnosis not present

## 2011-10-21 DIAGNOSIS — IMO0001 Reserved for inherently not codable concepts without codable children: Secondary | ICD-10-CM | POA: Diagnosis not present

## 2011-10-21 NOTE — Telephone Encounter (Signed)
Per prior documentation and messages, I called patient to inform that Dr. Lovena Le did say that it was okay for her to restart the medication, however, he would like her to get it filled by her orthopedic Dr. Jay[facility].  Pt became upset and voiced that this was a terrible inconvenience to her and that she did not appreciate that she would have to use different doctors for different medications.  I apologized for her inconvenience and that she was in pain, but patient hung up on me.  Will forward to Janan Halter, Therapist, sports for [employer].  Doug Sou, CMA

## 2011-10-30 ENCOUNTER — Ambulatory Visit (INDEPENDENT_AMBULATORY_CARE_PROVIDER_SITE_OTHER): Payer: Self-pay | Admitting: Cardiology

## 2011-10-30 DIAGNOSIS — I10 Essential (primary) hypertension: Secondary | ICD-10-CM | POA: Diagnosis not present

## 2011-10-30 DIAGNOSIS — IMO0001 Reserved for inherently not codable concepts without codable children: Secondary | ICD-10-CM | POA: Diagnosis not present

## 2011-10-30 DIAGNOSIS — I480 Paroxysmal atrial fibrillation: Secondary | ICD-10-CM

## 2011-10-30 DIAGNOSIS — I509 Heart failure, unspecified: Secondary | ICD-10-CM | POA: Diagnosis not present

## 2011-10-30 DIAGNOSIS — F0390 Unspecified dementia without behavioral disturbance: Secondary | ICD-10-CM | POA: Diagnosis not present

## 2011-10-30 DIAGNOSIS — Z7901 Long term (current) use of anticoagulants: Secondary | ICD-10-CM

## 2011-10-30 DIAGNOSIS — S72009D Fracture of unspecified part of neck of unspecified femur, subsequent encounter for closed fracture with routine healing: Secondary | ICD-10-CM | POA: Diagnosis not present

## 2011-10-30 DIAGNOSIS — I4891 Unspecified atrial fibrillation: Secondary | ICD-10-CM | POA: Diagnosis not present

## 2011-10-30 DIAGNOSIS — R0989 Other specified symptoms and signs involving the circulatory and respiratory systems: Secondary | ICD-10-CM

## 2011-11-02 ENCOUNTER — Telehealth: Payer: Self-pay | Admitting: Internal Medicine

## 2011-11-02 NOTE — Telephone Encounter (Signed)
New Msg: Pt call stating that she needs refill of metoprolol 25 mg 2x/day [facility] [address]. Pt is down to two tablets and would like refill called in prior to holiday if possible. Please return pt call to discuss further.

## 2011-11-04 MED ORDER — METOPROLOL TARTRATE 25 MG PO TABS: 25.0000 mg | ORAL_TABLET | Freq: Two times a day (BID) | ORAL | Status: DC

## 2011-11-04 NOTE — Telephone Encounter (Signed)
Pt discharge summary prior to last visit states that pt is to take metoprolol 25 mg bid, last ov does not have this medication listed.  Pt has been taking this medication.  It was added and refilled today. Doug Sou, CMA

## 2011-11-06 DIAGNOSIS — IMO0001 Reserved for inherently not codable concepts without codable children: Secondary | ICD-10-CM | POA: Diagnosis not present

## 2011-11-06 DIAGNOSIS — S72009D Fracture of unspecified part of neck of unspecified femur, subsequent encounter for closed fracture with routine healing: Secondary | ICD-10-CM | POA: Diagnosis not present

## 2011-11-06 DIAGNOSIS — I10 Essential (primary) hypertension: Secondary | ICD-10-CM | POA: Diagnosis not present

## 2011-11-06 DIAGNOSIS — F0390 Unspecified dementia without behavioral disturbance: Secondary | ICD-10-CM | POA: Diagnosis not present

## 2011-11-06 DIAGNOSIS — I509 Heart failure, unspecified: Secondary | ICD-10-CM | POA: Diagnosis not present

## 2011-11-06 DIAGNOSIS — I4891 Unspecified atrial fibrillation: Secondary | ICD-10-CM | POA: Diagnosis not present

## 2011-11-13 ENCOUNTER — Other Ambulatory Visit: Payer: Self-pay | Admitting: Internal Medicine

## 2011-11-13 ENCOUNTER — Ambulatory Visit (INDEPENDENT_AMBULATORY_CARE_PROVIDER_SITE_OTHER): Payer: Self-pay | Admitting: Cardiology

## 2011-11-13 DIAGNOSIS — Z7901 Long term (current) use of anticoagulants: Secondary | ICD-10-CM

## 2011-11-13 DIAGNOSIS — I480 Paroxysmal atrial fibrillation: Secondary | ICD-10-CM

## 2011-11-13 DIAGNOSIS — I10 Essential (primary) hypertension: Secondary | ICD-10-CM | POA: Diagnosis not present

## 2011-11-13 DIAGNOSIS — I4891 Unspecified atrial fibrillation: Secondary | ICD-10-CM | POA: Diagnosis not present

## 2011-11-13 DIAGNOSIS — IMO0001 Reserved for inherently not codable concepts without codable children: Secondary | ICD-10-CM | POA: Diagnosis not present

## 2011-11-13 DIAGNOSIS — F0390 Unspecified dementia without behavioral disturbance: Secondary | ICD-10-CM | POA: Diagnosis not present

## 2011-11-13 DIAGNOSIS — S72009D Fracture of unspecified part of neck of unspecified femur, subsequent encounter for closed fracture with routine healing: Secondary | ICD-10-CM | POA: Diagnosis not present

## 2011-11-13 DIAGNOSIS — I509 Heart failure, unspecified: Secondary | ICD-10-CM | POA: Diagnosis not present

## 2011-11-13 DIAGNOSIS — R0989 Other specified symptoms and signs involving the circulatory and respiratory systems: Secondary | ICD-10-CM

## 2011-11-13 LAB — POCT INR: INR: 1.8

## 2011-11-18 DIAGNOSIS — I10 Essential (primary) hypertension: Secondary | ICD-10-CM | POA: Diagnosis not present

## 2011-11-18 DIAGNOSIS — M109 Gout, unspecified: Secondary | ICD-10-CM | POA: Diagnosis not present

## 2011-11-20 ENCOUNTER — Telehealth: Payer: Self-pay | Admitting: Internal Medicine

## 2011-11-20 DIAGNOSIS — I509 Heart failure, unspecified: Secondary | ICD-10-CM | POA: Diagnosis not present

## 2011-11-20 DIAGNOSIS — I4891 Unspecified atrial fibrillation: Secondary | ICD-10-CM | POA: Diagnosis not present

## 2011-11-20 DIAGNOSIS — I10 Essential (primary) hypertension: Secondary | ICD-10-CM | POA: Diagnosis not present

## 2011-11-20 DIAGNOSIS — F0390 Unspecified dementia without behavioral disturbance: Secondary | ICD-10-CM | POA: Diagnosis not present

## 2011-11-20 DIAGNOSIS — IMO0001 Reserved for inherently not codable concepts without codable children: Secondary | ICD-10-CM | POA: Diagnosis not present

## 2011-11-20 DIAGNOSIS — S72009D Fracture of unspecified part of neck of unspecified femur, subsequent encounter for closed fracture with routine healing: Secondary | ICD-10-CM | POA: Diagnosis not present

## 2011-11-20 NOTE — Telephone Encounter (Signed)
ok 

## 2011-11-20 NOTE — Telephone Encounter (Signed)
New problem:  Per Adonis Brook, need order extended for pt/ inr check in the home. Verbal order would be fine

## 2011-11-21 DIAGNOSIS — I509 Heart failure, unspecified: Secondary | ICD-10-CM | POA: Diagnosis not present

## 2011-11-21 DIAGNOSIS — I4891 Unspecified atrial fibrillation: Secondary | ICD-10-CM | POA: Diagnosis not present

## 2011-11-21 DIAGNOSIS — M810 Age-related osteoporosis without current pathological fracture: Secondary | ICD-10-CM | POA: Diagnosis not present

## 2011-11-21 DIAGNOSIS — I10 Essential (primary) hypertension: Secondary | ICD-10-CM | POA: Diagnosis not present

## 2011-11-21 DIAGNOSIS — Z8673 Personal history of transient ischemic attack (TIA), and cerebral infarction without residual deficits: Secondary | ICD-10-CM | POA: Diagnosis not present

## 2011-11-21 DIAGNOSIS — F0390 Unspecified dementia without behavioral disturbance: Secondary | ICD-10-CM | POA: Diagnosis not present

## 2011-11-23 DIAGNOSIS — G459 Transient cerebral ischemic attack, unspecified: Secondary | ICD-10-CM | POA: Diagnosis not present

## 2011-11-23 DIAGNOSIS — R413 Other amnesia: Secondary | ICD-10-CM | POA: Diagnosis not present

## 2011-11-23 DIAGNOSIS — I4891 Unspecified atrial fibrillation: Secondary | ICD-10-CM | POA: Diagnosis not present

## 2011-11-23 DIAGNOSIS — G5730 Lesion of lateral popliteal nerve, unspecified lower limb: Secondary | ICD-10-CM | POA: Diagnosis not present

## 2011-11-27 ENCOUNTER — Ambulatory Visit: Payer: Self-pay | Admitting: Cardiovascular Disease

## 2011-11-27 DIAGNOSIS — I509 Heart failure, unspecified: Secondary | ICD-10-CM | POA: Diagnosis not present

## 2011-11-27 DIAGNOSIS — Z7901 Long term (current) use of anticoagulants: Secondary | ICD-10-CM

## 2011-11-27 DIAGNOSIS — M810 Age-related osteoporosis without current pathological fracture: Secondary | ICD-10-CM | POA: Diagnosis not present

## 2011-11-27 DIAGNOSIS — I4891 Unspecified atrial fibrillation: Secondary | ICD-10-CM | POA: Diagnosis not present

## 2011-11-27 DIAGNOSIS — I10 Essential (primary) hypertension: Secondary | ICD-10-CM | POA: Diagnosis not present

## 2011-11-27 DIAGNOSIS — F0390 Unspecified dementia without behavioral disturbance: Secondary | ICD-10-CM | POA: Diagnosis not present

## 2011-11-27 DIAGNOSIS — I480 Paroxysmal atrial fibrillation: Secondary | ICD-10-CM

## 2011-11-27 DIAGNOSIS — Z8673 Personal history of transient ischemic attack (TIA), and cerebral infarction without residual deficits: Secondary | ICD-10-CM | POA: Diagnosis not present

## 2011-11-27 LAB — POCT INR: INR: 1.8

## 2011-12-04 DIAGNOSIS — I509 Heart failure, unspecified: Secondary | ICD-10-CM | POA: Diagnosis not present

## 2011-12-04 DIAGNOSIS — M810 Age-related osteoporosis without current pathological fracture: Secondary | ICD-10-CM | POA: Diagnosis not present

## 2011-12-04 DIAGNOSIS — F0390 Unspecified dementia without behavioral disturbance: Secondary | ICD-10-CM | POA: Diagnosis not present

## 2011-12-04 DIAGNOSIS — Z8673 Personal history of transient ischemic attack (TIA), and cerebral infarction without residual deficits: Secondary | ICD-10-CM | POA: Diagnosis not present

## 2011-12-04 DIAGNOSIS — I4891 Unspecified atrial fibrillation: Secondary | ICD-10-CM | POA: Diagnosis not present

## 2011-12-04 DIAGNOSIS — I10 Essential (primary) hypertension: Secondary | ICD-10-CM | POA: Diagnosis not present

## 2011-12-08 DIAGNOSIS — M25569 Pain in unspecified knee: Secondary | ICD-10-CM | POA: Diagnosis not present

## 2011-12-11 ENCOUNTER — Ambulatory Visit (INDEPENDENT_AMBULATORY_CARE_PROVIDER_SITE_OTHER): Payer: Self-pay | Admitting: Internal Medicine

## 2011-12-11 DIAGNOSIS — I4891 Unspecified atrial fibrillation: Secondary | ICD-10-CM | POA: Diagnosis not present

## 2011-12-11 DIAGNOSIS — M810 Age-related osteoporosis without current pathological fracture: Secondary | ICD-10-CM | POA: Diagnosis not present

## 2011-12-11 DIAGNOSIS — R0989 Other specified symptoms and signs involving the circulatory and respiratory systems: Secondary | ICD-10-CM

## 2011-12-11 DIAGNOSIS — F0390 Unspecified dementia without behavioral disturbance: Secondary | ICD-10-CM | POA: Diagnosis not present

## 2011-12-11 DIAGNOSIS — I509 Heart failure, unspecified: Secondary | ICD-10-CM | POA: Diagnosis not present

## 2011-12-11 DIAGNOSIS — Z7901 Long term (current) use of anticoagulants: Secondary | ICD-10-CM

## 2011-12-11 DIAGNOSIS — I480 Paroxysmal atrial fibrillation: Secondary | ICD-10-CM

## 2011-12-11 DIAGNOSIS — Z8673 Personal history of transient ischemic attack (TIA), and cerebral infarction without residual deficits: Secondary | ICD-10-CM | POA: Diagnosis not present

## 2011-12-11 DIAGNOSIS — I10 Essential (primary) hypertension: Secondary | ICD-10-CM | POA: Diagnosis not present

## 2011-12-11 LAB — POCT INR: INR: 2.1

## 2011-12-17 DIAGNOSIS — I509 Heart failure, unspecified: Secondary | ICD-10-CM | POA: Diagnosis not present

## 2011-12-17 DIAGNOSIS — M810 Age-related osteoporosis without current pathological fracture: Secondary | ICD-10-CM | POA: Diagnosis not present

## 2011-12-17 DIAGNOSIS — I10 Essential (primary) hypertension: Secondary | ICD-10-CM | POA: Diagnosis not present

## 2011-12-17 DIAGNOSIS — Z8673 Personal history of transient ischemic attack (TIA), and cerebral infarction without residual deficits: Secondary | ICD-10-CM | POA: Diagnosis not present

## 2011-12-17 DIAGNOSIS — F0390 Unspecified dementia without behavioral disturbance: Secondary | ICD-10-CM | POA: Diagnosis not present

## 2011-12-17 DIAGNOSIS — I4891 Unspecified atrial fibrillation: Secondary | ICD-10-CM | POA: Diagnosis not present

## 2011-12-22 DIAGNOSIS — T84498A Other mechanical complication of other internal orthopedic devices, implants and grafts, initial encounter: Secondary | ICD-10-CM | POA: Diagnosis not present

## 2011-12-25 ENCOUNTER — Ambulatory Visit: Payer: Self-pay | Admitting: Cardiovascular Disease

## 2011-12-25 DIAGNOSIS — I10 Essential (primary) hypertension: Secondary | ICD-10-CM | POA: Diagnosis not present

## 2011-12-25 DIAGNOSIS — I4891 Unspecified atrial fibrillation: Secondary | ICD-10-CM | POA: Diagnosis not present

## 2011-12-25 DIAGNOSIS — I480 Paroxysmal atrial fibrillation: Secondary | ICD-10-CM

## 2011-12-25 DIAGNOSIS — I509 Heart failure, unspecified: Secondary | ICD-10-CM | POA: Diagnosis not present

## 2011-12-25 DIAGNOSIS — M810 Age-related osteoporosis without current pathological fracture: Secondary | ICD-10-CM | POA: Diagnosis not present

## 2011-12-25 DIAGNOSIS — Z8673 Personal history of transient ischemic attack (TIA), and cerebral infarction without residual deficits: Secondary | ICD-10-CM | POA: Diagnosis not present

## 2011-12-25 DIAGNOSIS — F0390 Unspecified dementia without behavioral disturbance: Secondary | ICD-10-CM | POA: Diagnosis not present

## 2011-12-25 DIAGNOSIS — Z7901 Long term (current) use of anticoagulants: Secondary | ICD-10-CM

## 2011-12-25 LAB — POCT INR: INR: 1.9

## 2012-01-04 ENCOUNTER — Encounter: Payer: Self-pay | Admitting: Internal Medicine

## 2012-01-04 ENCOUNTER — Ambulatory Visit (INDEPENDENT_AMBULATORY_CARE_PROVIDER_SITE_OTHER): Payer: Medicare Other | Admitting: Pharmacist

## 2012-01-04 ENCOUNTER — Ambulatory Visit (INDEPENDENT_AMBULATORY_CARE_PROVIDER_SITE_OTHER): Payer: Medicare Other | Admitting: Internal Medicine

## 2012-01-04 VITALS — BP 126/68 | HR 48 | Ht 60.0 in | Wt 110.8 lb

## 2012-01-04 DIAGNOSIS — I4891 Unspecified atrial fibrillation: Secondary | ICD-10-CM

## 2012-01-04 DIAGNOSIS — I5031 Acute diastolic (congestive) heart failure: Secondary | ICD-10-CM

## 2012-01-04 DIAGNOSIS — I10 Essential (primary) hypertension: Secondary | ICD-10-CM | POA: Diagnosis not present

## 2012-01-04 DIAGNOSIS — I480 Paroxysmal atrial fibrillation: Secondary | ICD-10-CM

## 2012-01-04 DIAGNOSIS — Z7901 Long term (current) use of anticoagulants: Secondary | ICD-10-CM

## 2012-01-04 LAB — POCT INR: INR: 2.2

## 2012-01-04 MED ORDER — CELECOXIB 100 MG PO CAPS: 100.0000 mg | ORAL_CAPSULE | Freq: Every day | ORAL | Status: DC

## 2012-01-04 NOTE — Assessment & Plan Note (Signed)
Her current symptoms are class II. She will continue her current medication and maintain a low-sodium diet.

## 2012-01-04 NOTE — Assessment & Plan Note (Signed)
Overall, her symptoms are well controlled. I expect she will have more atrial fibrillation and a decision will then need to be made regarding rate control and pacemaker insertion versus rhythm control and pacemaker insertion. She will undergo watchful waiting.

## 2012-01-04 NOTE — Assessment & Plan Note (Signed)
Her blood pressure is well controlled. No change in medical therapy.

## 2012-01-04 NOTE — Patient Instructions (Signed)
Your physician wants you to follow-up in: 6 months with Dr Taylor You will receive a reminder letter in the mail two months in advance. If you don't receive a letter, please call our office to schedule the follow-up appointment.  

## 2012-01-04 NOTE — Progress Notes (Signed)
HPI Mrs. Knill returns today for followup. She is a very pleasant 78 year old woman with a history of paroxysmal atrial fibrillation and a rapid ventricular response, sinus bradycardia, and evidence of amiodarone toxicity. She has stopped her amiodarone therapy. She feels better. She describes one episode where she was awakened at night with recurrent atrial fibrillation. The rhythm diagnosis however was not verified because she did not seek medical attention. Her symptoms subsided within an hour. No other complaints today. She denies syncope, shortness of breath, or chest pain. She does have residual pain in her leg. Allergies  Allergen Reactions  . Codeine Nausea Only     Current Outpatient Prescriptions  Medication Sig Dispense Refill  . celecoxib (CELEBREX) 100 MG capsule Take 1 capsule (100 mg total) by mouth daily.  30 capsule  11  . donepezil (ARICEPT) 10 MG tablet Take 10 mg by mouth at bedtime.        . fluocinonide cream (LIDEX) [id number] % Apply 1 application topically as needed.       . furosemide (LASIX) 20 MG tablet Take 1 tablet (20 mg total) by mouth daily.  30 tablet  11  . metoprolol tartrate (LOPRESSOR) 25 MG tablet Take 1 tablet (25 mg total) by mouth 2 (two) times daily.  60 tablet  11  . potassium chloride (K-DUR) 10 MEQ tablet Take 2 tablets (20 mEq total) by mouth daily.  30 tablet  0  . simvastatin (ZOCOR) 20 MG tablet Take 20 mg by mouth at bedtime.        . temazepam (RESTORIL) 30 MG capsule Take 30 mg by mouth at bedtime as needed.        . traMADol (ULTRAM) 50 MG tablet Take 50 mg by mouth every 6 (six) hours as needed.       . warfarin (COUMADIN) 3 MG tablet Take 3 mg by mouth daily. Take 1 tablet by mouth Monday, Wednesday and Friday and take 1 and 1/2 tablets on Tues, Thurs, Saturday, and Sunday      . warfarin (COUMADIN) 3 MG tablet TAKE 1 TABLET BY MOUTH ON MON,WED,FRI THEN TAKE 1 & 1/ TAB ON OTHER DAYS  40 tablet  1     Past Medical History  Diagnosis Date  .  Hypertension   . Hypercholesteremia   . Coronary artery disease   . Dementia in Alzheimer's disease with early onset   . Osteoporosis   . Atrial fibrillation   . Arthritis     ROS:   All systems reviewed and negative except as noted in the HPI.   Past Surgical History  Procedure Date  . Replacement total knee bilateral   . Right shoulder replacement 2011  . Right intertrochanteric hip fracture status post orif August 2012     Family History  Problem Relation Age of Onset  . Coronary artery disease       History   Social History  . Marital Status: Widowed    Spouse Name: N/A    Number of Children: N/A  . Years of Education: N/A   Occupational History  . Not on file.   Social History Main Topics  . Smoking status: Never Smoker   . Smokeless tobacco: Never Used  . Alcohol Use: 0.6 oz/week    1 Glasses of wine per week     1 glass wine with dinner some days  . Drug Use: No  . Sexually Active: Not on file   Other Topics Concern  . Not on  file   Social History Narrative  . No narrative on file     BP 126/68  Pulse 48  Ht 5' (1.524 m)  Wt 50.259 kg (110 lb 12.8 oz)  BMI 21.64 kg/m2  Physical Exam:  Well appearing elderly woman, NAD HEENT: Unremarkable Neck:  No JVD, no thyromegally Lungs:  Clear with no wheezes, rales, or rhonchi. HEART:  Regular brady rhythm, no murmurs, no rubs, no clicks Abd:  soft, positive bowel sounds, no organomegally, no rebound, no guarding Ext:  2 plus pulses, no edema, no cyanosis, no clubbing Skin:  No rashes no nodules Neuro:  CN II through XII intact, motor grossly intact  EKG Sinus bradycardia at 48 beats per minute  Assess/Plan:

## 2012-01-10 ENCOUNTER — Other Ambulatory Visit: Payer: Self-pay | Admitting: Internal Medicine

## 2012-01-14 DIAGNOSIS — M25559 Pain in unspecified hip: Secondary | ICD-10-CM | POA: Diagnosis not present

## 2012-01-15 ENCOUNTER — Other Ambulatory Visit (HOSPITAL_COMMUNITY): Payer: Self-pay | Admitting: Orthopedic Surgery

## 2012-01-15 DIAGNOSIS — M25551 Pain in right hip: Secondary | ICD-10-CM

## 2012-01-20 ENCOUNTER — Encounter (HOSPITAL_COMMUNITY): Payer: Medicare Other

## 2012-01-20 ENCOUNTER — Other Ambulatory Visit (HOSPITAL_COMMUNITY): Payer: Medicare Other

## 2012-01-22 ENCOUNTER — Encounter (HOSPITAL_COMMUNITY)
Admission: RE | Admit: 2012-01-22 | Discharge: 2012-01-22 | Disposition: A | Discharge status: Home / Self Care | Payer: Medicare Other | Source: Ambulatory Visit | Attending: Orthopedic Surgery | Admitting: Orthopedic Surgery

## 2012-01-22 ENCOUNTER — Encounter (HOSPITAL_COMMUNITY): Payer: Self-pay

## 2012-01-22 DIAGNOSIS — IMO0001 Reserved for inherently not codable concepts without codable children: Secondary | ICD-10-CM | POA: Diagnosis not present

## 2012-01-22 DIAGNOSIS — M25551 Pain in right hip: Secondary | ICD-10-CM

## 2012-01-22 DIAGNOSIS — M25559 Pain in unspecified hip: Secondary | ICD-10-CM | POA: Insufficient documentation

## 2012-01-22 MED ORDER — TECHNETIUM TC 99M MEDRONATE IV KIT
24.0000 | PACK | Freq: Once | INTRAVENOUS | Status: AC | PRN
  Administered 2012-01-22: 24 via INTRAVENOUS

## 2012-01-26 ENCOUNTER — Ambulatory Visit (INDEPENDENT_AMBULATORY_CARE_PROVIDER_SITE_OTHER): Payer: Medicare Other | Admitting: Pharmacist

## 2012-01-26 DIAGNOSIS — Z7901 Long term (current) use of anticoagulants: Secondary | ICD-10-CM

## 2012-01-26 DIAGNOSIS — Z0000 Encounter for general adult medical examination without abnormal findings: Secondary | ICD-10-CM | POA: Diagnosis not present

## 2012-01-26 DIAGNOSIS — I480 Paroxysmal atrial fibrillation: Secondary | ICD-10-CM

## 2012-01-26 DIAGNOSIS — I4891 Unspecified atrial fibrillation: Secondary | ICD-10-CM

## 2012-01-26 DIAGNOSIS — Z1331 Encounter for screening for depression: Secondary | ICD-10-CM | POA: Diagnosis not present

## 2012-01-28 DIAGNOSIS — M25559 Pain in unspecified hip: Secondary | ICD-10-CM | POA: Diagnosis not present

## 2012-01-28 DIAGNOSIS — M545 Low back pain: Secondary | ICD-10-CM | POA: Diagnosis not present

## 2012-02-02 ENCOUNTER — Other Ambulatory Visit: Payer: Self-pay | Admitting: *Deleted

## 2012-02-02 ENCOUNTER — Other Ambulatory Visit: Payer: Self-pay

## 2012-02-02 MED ORDER — METOPROLOL TARTRATE 25 MG PO TABS: 25.0000 mg | ORAL_TABLET | Freq: Two times a day (BID) | ORAL | Status: DC

## 2012-02-02 MED ORDER — CELECOXIB 100 MG PO CAPS: 100.0000 mg | ORAL_CAPSULE | Freq: Every day | ORAL | Status: DC

## 2012-02-02 MED ORDER — WARFARIN SODIUM 3 MG PO TABS: ORAL_TABLET | ORAL | Status: DC

## 2012-02-02 MED ORDER — POTASSIUM CHLORIDE ER 10 MEQ PO TBCR: 20.0000 meq | EXTENDED_RELEASE_TABLET | Freq: Every day | ORAL | Status: DC

## 2012-02-02 MED ORDER — FUROSEMIDE 20 MG PO TABS: 20.0000 mg | ORAL_TABLET | Freq: Every day | ORAL | Status: DC

## 2012-02-09 ENCOUNTER — Telehealth: Payer: Self-pay | Admitting: Internal Medicine

## 2012-02-09 MED ORDER — DABIGATRAN ETEXILATE MESYLATE 75 MG PO CAPS: 75.0000 mg | ORAL_CAPSULE | Freq: Two times a day (BID) | ORAL | Status: DC

## 2012-02-09 NOTE — Telephone Encounter (Signed)
Spoke with the patient and she wants to try the Pradax 150mg  twice daily

## 2012-02-09 NOTE — Telephone Encounter (Signed)
Fu call She is at home please return her call

## 2012-02-09 NOTE — Telephone Encounter (Signed)
Called patient and left her a message to call me back to discuss

## 2012-02-09 NOTE — Telephone Encounter (Signed)
It is Pradaxa 75mg  twice daily that he suggested after talking with the patient.  I will call in for her  She is going to take her last dose of Coumadin on Wed and start the Pradaxa on Mon per Dr Tanna Furry recommendation

## 2012-02-09 NOTE — Telephone Encounter (Signed)
New Problem:     Patient called in needing a prescription for the Pradaxa Dr. Caryl Comes offered her to try.      [name]: Patient also wanted to know what her dietary restrictions were while taking Pradaxa.  Please call back.

## 2012-02-09 NOTE — Telephone Encounter (Signed)
Printed refill and gave to [facility]

## 2012-02-09 NOTE — Telephone Encounter (Deleted)
New Problem:     Patient called in needing a prescription for the Pradaxa Dr. Caryl Comes offered her to try.      Patient also wanted to know what her dietary restrictions were while taking Pradaxa.  Please call back

## 2012-02-12 ENCOUNTER — Other Ambulatory Visit: Payer: Self-pay | Admitting: Internal Medicine

## 2012-02-12 MED ORDER — METOPROLOL TARTRATE 25 MG PO TABS: 25.0000 mg | ORAL_TABLET | Freq: Two times a day (BID) | ORAL | Status: DC

## 2012-02-12 MED ORDER — FUROSEMIDE 20 MG PO TABS: 20.0000 mg | ORAL_TABLET | Freq: Every day | ORAL | Status: DC

## 2012-02-26 DIAGNOSIS — M19019 Primary osteoarthritis, unspecified shoulder: Secondary | ICD-10-CM | POA: Diagnosis not present

## 2012-03-04 DIAGNOSIS — J301 Allergic rhinitis due to pollen: Secondary | ICD-10-CM | POA: Diagnosis not present

## 2012-03-04 DIAGNOSIS — J3089 Other allergic rhinitis: Secondary | ICD-10-CM | POA: Diagnosis not present

## 2012-04-15 DIAGNOSIS — J3089 Other allergic rhinitis: Secondary | ICD-10-CM | POA: Diagnosis not present

## 2012-04-15 DIAGNOSIS — J301 Allergic rhinitis due to pollen: Secondary | ICD-10-CM | POA: Diagnosis not present

## 2012-04-25 ENCOUNTER — Ambulatory Visit: Payer: Self-pay | Admitting: Internal Medicine

## 2012-04-25 DIAGNOSIS — Z7901 Long term (current) use of anticoagulants: Secondary | ICD-10-CM

## 2012-04-25 DIAGNOSIS — I480 Paroxysmal atrial fibrillation: Secondary | ICD-10-CM

## 2012-05-02 DIAGNOSIS — M25519 Pain in unspecified shoulder: Secondary | ICD-10-CM | POA: Diagnosis not present

## 2012-05-31 DIAGNOSIS — J310 Chronic rhinitis: Secondary | ICD-10-CM | POA: Diagnosis not present

## 2012-05-31 DIAGNOSIS — J342 Deviated nasal septum: Secondary | ICD-10-CM | POA: Diagnosis not present

## 2012-06-02 DIAGNOSIS — H353190 Nonexudative age-related macular degeneration, unspecified eye, stage unspecified: Secondary | ICD-10-CM | POA: Diagnosis not present

## 2012-06-10 ENCOUNTER — Telehealth: Payer: Self-pay | Admitting: Internal Medicine

## 2012-06-10 NOTE — Telephone Encounter (Signed)
Please return call to patient [phone].  Pt needs coupon for Pradaxa, the RX was too expensive when she picked it up.

## 2012-06-10 NOTE — Telephone Encounter (Signed)
Left message for pt to call.

## 2012-06-13 NOTE — Telephone Encounter (Signed)
Fu call Pt is calling back about this issue

## 2012-06-13 NOTE — Telephone Encounter (Signed)
lmom for patient to return my call.  If she has medicare the coupon will not help.  It will only reduce the cost if she has Pharmacist, community

## 2012-06-16 ENCOUNTER — Other Ambulatory Visit: Payer: Self-pay | Admitting: Internal Medicine

## 2012-07-08 ENCOUNTER — Encounter: Payer: Self-pay | Admitting: Internal Medicine

## 2012-07-08 ENCOUNTER — Ambulatory Visit (INDEPENDENT_AMBULATORY_CARE_PROVIDER_SITE_OTHER): Payer: Medicare Other | Admitting: Internal Medicine

## 2012-07-08 VITALS — BP 99/53 | HR 53 | Ht 59.0 in | Wt 117.8 lb

## 2012-07-08 DIAGNOSIS — I5031 Acute diastolic (congestive) heart failure: Secondary | ICD-10-CM | POA: Diagnosis not present

## 2012-07-08 DIAGNOSIS — I4891 Unspecified atrial fibrillation: Secondary | ICD-10-CM

## 2012-07-08 DIAGNOSIS — I480 Paroxysmal atrial fibrillation: Secondary | ICD-10-CM

## 2012-07-08 MED ORDER — SIMVASTATIN 20 MG PO TABS: 10.0000 mg | ORAL_TABLET | Freq: Every day | ORAL | Status: DC

## 2012-07-08 NOTE — Progress Notes (Signed)
HPI [name] returns today for followup. She is a very pleasant 78 year old woman with paroxysmal atrial fibrillation, hypertension, who has maintained sinus rhythm on beta blocker therapy. In the interim, she has done well. She denies chest pain or shortness of breath. No syncope. She has very minimal palpitations. Allergies  Allergen Reactions  . Codeine Nausea Only     Current Outpatient Prescriptions  Medication Sig Dispense Refill  . celecoxib (CELEBREX) 100 MG capsule Take 1 capsule (100 mg total) by mouth daily.  90 capsule  2  . dabigatran (PRADAXA) 75 MG CAPS Take 1 capsule (75 mg total) by mouth every 12 (twelve) hours.  60 capsule  6  . donepezil (ARICEPT) 10 MG tablet Take 10 mg by mouth at bedtime.        . fluocinonide cream (LIDEX) [id number] % Apply 1 application topically as needed.       . furosemide (LASIX) 10 MG/ML solution Take 10 mg by mouth daily.      . metoprolol tartrate (LOPRESSOR) 25 MG tablet Take 1 tablet (25 mg total) by mouth 2 (two) times daily.  180 tablet  1  . potassium chloride (K-DUR) 10 MEQ tablet Take 2 tablets (20 mEq total) by mouth daily.  180 tablet  2  . simvastatin (ZOCOR) 20 MG tablet Take 20 mg by mouth at bedtime.        . temazepam (RESTORIL) 30 MG capsule Take 30 mg by mouth at bedtime as needed.        . traMADol (ULTRAM) 50 MG tablet Take 50 mg by mouth every 6 (six) hours as needed.          Past Medical History  Diagnosis Date  . Hypertension   . Hypercholesteremia   . Coronary artery disease   . Dementia in Alzheimer's disease with early onset   . Osteoporosis   . Atrial fibrillation   . Arthritis     ROS:   All systems reviewed and negative except as noted in the HPI.   Past Surgical History  Procedure Date  . Replacement total knee bilateral   . Right shoulder replacement 2011  . Right intertrochanteric hip fracture status post orif August 2012     Family History  Problem Relation Age of Onset  . Coronary artery  disease       History   Social History  . Marital Status: Widowed    Spouse Name: N/A    Number of Children: N/A  . Years of Education: N/A   Occupational History  . Not on file.   Social History Main Topics  . Smoking status: Never Smoker   . Smokeless tobacco: Never Used  . Alcohol Use: 0.6 oz/week    1 Glasses of wine per week     1 glass wine with dinner some days  . Drug Use: No  . Sexually Active: Not on file   Other Topics Concern  . Not on file   Social History Narrative  . No narrative on file     BP 99/53  Pulse 53  Ht 4\' 11"  (1.499 m)  Wt 117 lb 12.8 oz (53.434 kg)  BMI 23.79 kg/m2  Physical Exam:  Well appearing elderly woman, NAD HEENT: Unremarkable Neck:  No JVD, no thyromegally Lungs:  Clear with no wheezes, rales, or rhonchi. HEART:  Regular rate rhythm, no murmurs, no rubs, no clicks Abd:  soft, positive bowel sounds, no organomegally, no rebound, no guarding Ext:  2 plus pulses,  no edema, no cyanosis, no clubbing Skin:  No rashes no nodules Neuro:  CN II through XII intact, motor grossly intact  EKG Sinus bradycardia at 54 beats per minute  Assess/Plan:

## 2012-07-08 NOTE — Assessment & Plan Note (Signed)
She is maintaining sinus rhythm. No change in medical therapy at this time.

## 2012-07-08 NOTE — Patient Instructions (Addendum)
Your physician wants you to follow-up in: 12 months with Dr Knox Saliva will receive a reminder letter in the mail two months in advance. If you don't receive a letter, please call our office to schedule the follow-up appointment.    Your physician has recommended you make the following change in your medication:  1) Decrease Simvastatin to 10mg  (1/2 of a 20mg  tablet daily)

## 2012-07-08 NOTE — Assessment & Plan Note (Signed)
Her symptoms are well-controlled. I've asked the patient to maintain a low-sodium diet. Her blood pressure is quite well controlled today.

## 2012-07-12 DIAGNOSIS — Z23 Encounter for immunization: Secondary | ICD-10-CM | POA: Diagnosis not present

## 2012-07-29 DIAGNOSIS — L299 Pruritus, unspecified: Secondary | ICD-10-CM | POA: Diagnosis not present

## 2012-07-29 DIAGNOSIS — Z79899 Other long term (current) drug therapy: Secondary | ICD-10-CM | POA: Diagnosis not present

## 2012-07-29 DIAGNOSIS — R350 Frequency of micturition: Secondary | ICD-10-CM | POA: Diagnosis not present

## 2012-07-29 DIAGNOSIS — E782 Mixed hyperlipidemia: Secondary | ICD-10-CM | POA: Diagnosis not present

## 2012-07-29 DIAGNOSIS — I10 Essential (primary) hypertension: Secondary | ICD-10-CM | POA: Diagnosis not present

## 2012-08-10 ENCOUNTER — Telehealth: Payer: Self-pay | Admitting: Internal Medicine

## 2012-08-10 ENCOUNTER — Other Ambulatory Visit: Payer: Self-pay | Admitting: Internal Medicine

## 2012-08-10 MED ORDER — SIMVASTATIN 10 MG PO TABS: 10.0000 mg | ORAL_TABLET | Freq: Every day | ORAL | Status: DC

## 2012-08-10 NOTE — Telephone Encounter (Signed)
plz return call to pt at [phone] regarding med dosage

## 2012-08-10 NOTE — Telephone Encounter (Signed)
Will put samples out front for Pradaxa 75mg  for her

## 2012-08-12 ENCOUNTER — Other Ambulatory Visit: Payer: Self-pay | Admitting: *Deleted

## 2012-08-12 NOTE — Telephone Encounter (Signed)
Opened in Error.

## 2012-08-29 DIAGNOSIS — M76899 Other specified enthesopathies of unspecified lower limb, excluding foot: Secondary | ICD-10-CM | POA: Diagnosis not present

## 2012-08-31 DIAGNOSIS — M76899 Other specified enthesopathies of unspecified lower limb, excluding foot: Secondary | ICD-10-CM | POA: Diagnosis not present

## 2012-10-03 DIAGNOSIS — M19049 Primary osteoarthritis, unspecified hand: Secondary | ICD-10-CM | POA: Diagnosis not present

## 2012-10-03 DIAGNOSIS — M25569 Pain in unspecified knee: Secondary | ICD-10-CM | POA: Diagnosis not present

## 2012-10-05 DIAGNOSIS — M19019 Primary osteoarthritis, unspecified shoulder: Secondary | ICD-10-CM | POA: Diagnosis not present

## 2012-10-28 ENCOUNTER — Other Ambulatory Visit: Payer: Self-pay | Admitting: Internal Medicine

## 2012-10-30 ENCOUNTER — Other Ambulatory Visit: Payer: Self-pay | Admitting: Internal Medicine

## 2012-11-03 ENCOUNTER — Other Ambulatory Visit: Payer: Self-pay | Admitting: Internal Medicine

## 2012-12-11 DIAGNOSIS — S20219A Contusion of unspecified front wall of thorax, initial encounter: Secondary | ICD-10-CM | POA: Diagnosis not present

## 2012-12-12 ENCOUNTER — Other Ambulatory Visit: Payer: Self-pay | Admitting: Internal Medicine

## 2012-12-22 DIAGNOSIS — H353190 Nonexudative age-related macular degeneration, unspecified eye, stage unspecified: Secondary | ICD-10-CM | POA: Diagnosis not present

## 2013-01-01 ENCOUNTER — Other Ambulatory Visit: Payer: Self-pay | Admitting: Internal Medicine

## 2013-01-16 DIAGNOSIS — R079 Chest pain, unspecified: Secondary | ICD-10-CM | POA: Diagnosis not present

## 2013-01-19 DIAGNOSIS — R079 Chest pain, unspecified: Secondary | ICD-10-CM | POA: Diagnosis not present

## 2013-01-26 DIAGNOSIS — R079 Chest pain, unspecified: Secondary | ICD-10-CM | POA: Diagnosis not present

## 2013-01-26 DIAGNOSIS — S7000XA Contusion of unspecified hip, initial encounter: Secondary | ICD-10-CM | POA: Diagnosis not present

## 2013-01-27 DIAGNOSIS — R079 Chest pain, unspecified: Secondary | ICD-10-CM | POA: Diagnosis not present

## 2013-01-27 DIAGNOSIS — S7000XA Contusion of unspecified hip, initial encounter: Secondary | ICD-10-CM | POA: Diagnosis not present

## 2013-02-06 DIAGNOSIS — R232 Flushing: Secondary | ICD-10-CM | POA: Diagnosis not present

## 2013-02-06 DIAGNOSIS — Z0000 Encounter for general adult medical examination without abnormal findings: Secondary | ICD-10-CM | POA: Diagnosis not present

## 2013-02-06 DIAGNOSIS — Z1331 Encounter for screening for depression: Secondary | ICD-10-CM | POA: Diagnosis not present

## 2013-02-06 DIAGNOSIS — M1990 Unspecified osteoarthritis, unspecified site: Secondary | ICD-10-CM | POA: Diagnosis not present

## 2013-02-06 DIAGNOSIS — Z79899 Other long term (current) drug therapy: Secondary | ICD-10-CM | POA: Diagnosis not present

## 2013-03-10 ENCOUNTER — Telehealth: Payer: Self-pay | Admitting: Internal Medicine

## 2013-03-10 NOTE — Telephone Encounter (Signed)
lmom for patient to return my call 

## 2013-03-10 NOTE — Telephone Encounter (Signed)
Spoke with the patient.  She saw her PCP dr Felipa Eth 3 weeks ago.  She was not having the problems at the time of her visit.  She is now having GI problems and also feels like she may have some CHF.  No weight gain but feels her bras are tight and she is SOB with minimal exertion.  I let her know I would discuss this with Dr Lovena Le and call her early next week.

## 2013-03-10 NOTE — Telephone Encounter (Signed)
New problem   Pt wanted an appt-offered 1st avail 03/22/13 but she said she needs to see him before then-needs to discuss medications and she's having more sob

## 2013-03-14 ENCOUNTER — Telehealth: Payer: Self-pay | Admitting: Neurology

## 2013-03-14 MED ORDER — DONEPEZIL HCL 10 MG PO TABS: 10.0000 mg | ORAL_TABLET | Freq: Every day | ORAL | Status: DC

## 2013-03-14 NOTE — Telephone Encounter (Signed)
[facility] patient, has not been assigned new MD.  I auth 1 refill via [facility] and noted appt needed.  Message from Triage:  [name] ','<More Detail >>       [name]     MRN: [id number] DOB: 05-25-1928     Pt Home: [phone]     Sent: Tue Mar 14, 2013 10:49 AM     To: Norva Pavlov, [place] please see encounter. Patient is out of her Aricept. [name] is in process seeing if we can get patient with Dr.D. Maybe one refill ? As soon as [name] tells me who to sch. Her with I will make a not under the phone note. Thanks message came to triage pool.

## 2013-03-15 NOTE — Telephone Encounter (Signed)
I gave Tomeko for scheduling (transfer Dr. Erling Cruz pt.

## 2013-03-16 NOTE — Telephone Encounter (Signed)
Will see Dr Lovena Le on 03/22/13 at 4:30  Pt aware

## 2013-03-22 ENCOUNTER — Encounter: Payer: Self-pay | Admitting: Internal Medicine

## 2013-03-22 ENCOUNTER — Ambulatory Visit (INDEPENDENT_AMBULATORY_CARE_PROVIDER_SITE_OTHER): Payer: Medicare Other | Admitting: Internal Medicine

## 2013-03-22 VITALS — BP 139/72 | HR 72 | Ht 59.0 in | Wt 112.0 lb

## 2013-03-22 DIAGNOSIS — I509 Heart failure, unspecified: Secondary | ICD-10-CM | POA: Diagnosis not present

## 2013-03-22 DIAGNOSIS — I480 Paroxysmal atrial fibrillation: Secondary | ICD-10-CM

## 2013-03-22 DIAGNOSIS — I4891 Unspecified atrial fibrillation: Secondary | ICD-10-CM | POA: Diagnosis not present

## 2013-03-22 DIAGNOSIS — I10 Essential (primary) hypertension: Secondary | ICD-10-CM | POA: Diagnosis not present

## 2013-03-22 MED ORDER — METOPROLOL TARTRATE 25 MG PO TABS: 12.5000 mg | ORAL_TABLET | Freq: Two times a day (BID) | ORAL | Status: DC

## 2013-03-22 MED ORDER — FUROSEMIDE 20 MG PO TABS: ORAL_TABLET | ORAL | Status: DC

## 2013-03-22 NOTE — Patient Instructions (Addendum)
Decrease your Metoprolol to 1/2 tablet twice a day  Change your lasix (furosemide) to 20mg  every Monday, Wednesday, & Friday.  All other days take 10mg   Your physician recommends that you schedule a follow-up appointment in: 3 months with Dr. Lovena Le

## 2013-03-22 NOTE — Progress Notes (Signed)
HPI [name] returns today for followup. She is a very pleasant 79 year old woman with a history of hypertension, paroxysmal atrial fibrillation, and chronic diastolic heart failure.over the last several weeks, she has noted increasing fatigue and shortness of breath. She has rare palpitations. She notes that her appetite is reduced. She states that in the evening she feels more breathless. She has mild swelling in her lower extremities. She has not had syncope. Allergies  Allergen Reactions  . Codeine Nausea Only     Current Outpatient Prescriptions  Medication Sig Dispense Refill  . celecoxib (CELEBREX) 100 MG capsule Take 1 capsule (100 mg total) by mouth daily.  90 capsule  2  . donepezil (ARICEPT) 10 MG tablet Take 1 tablet (10 mg total) by mouth daily.  30 tablet  0  . furosemide (LASIX) 20 MG tablet Take 1 tablet on Monday, [facility] Friday.  All other days take 1/2 tablet  30 tablet  6  . metoprolol tartrate (LOPRESSOR) 25 MG tablet Take 0.5 tablets (12.5 mg total) by mouth 2 (two) times daily.  180 tablet  1  . potassium chloride (K-DUR) 10 MEQ tablet Take 2 tablets (20 mEq total) by mouth daily.  180 tablet  2  . PRADAXA 75 MG CAPS TAKE ONE CAPSULE BY MOUTH EVERY 12 HOURS  60 capsule  6  . simvastatin (ZOCOR) 10 MG tablet Take 1 tablet (10 mg total) by mouth at bedtime.  30 tablet  11  . temazepam (RESTORIL) 30 MG capsule Take 30 mg by mouth at bedtime as needed.        . traMADol (ULTRAM) 50 MG tablet Take 50 mg by mouth every 6 (six) hours as needed.        No current facility-administered medications for this visit.     Past Medical History  Diagnosis Date  . Hypertension   . Hypercholesteremia   . Coronary artery disease   . Dementia in Alzheimer's disease with early onset   . Osteoporosis   . Atrial fibrillation   . Arthritis     ROS:   All systems reviewed and negative except as noted in the HPI.   Past Surgical History  Procedure Laterality Date  .  Replacement total knee bilateral    . Right shoulder replacement  2011  . Right intertrochanteric hip fracture status post orif  August 2012     Family History  Problem Relation Age of Onset  . Coronary artery disease       History   Social History  . Marital Status: Widowed    Spouse Name: N/A    Number of Children: N/A  . Years of Education: N/A   Occupational History  . Not on file.   Social History Main Topics  . Smoking status: Never Smoker   . Smokeless tobacco: Never Used  . Alcohol Use: 0.6 oz/week    1 Glasses of wine per week     Comment: 1 glass wine with dinner some days  . Drug Use: No  . Sexually Active: Not on file   Other Topics Concern  . Not on file   Social History Narrative  . No narrative on file     BP 139/72  Pulse 72  Ht 4\' 11"  (1.499 m)  Wt 112 lb (50.803 kg)  BMI 22.61 kg/m2  Physical Exam:  Well appearing elderly woman, NAD HEENT: Unremarkable Neck:  7 cm JVD, no thyromegally Back:  No CVA tenderness Lungs:  Clear with no wheezes,  rales, or rhonchi. HEART:  Regular rate rhythm, no murmurs, no rubs, no clicks Abd:  soft, positive bowel sounds, no organomegally, no rebound, no guarding Ext:  2 plus pulses, no edema, no cyanosis, no clubbing Skin:  No rashes no nodules Neuro:  CN II through XII intact, motor grossly intact  EKG - normal sinus rhythm  Assess/Plan:

## 2013-03-22 NOTE — Assessment & Plan Note (Signed)
The patient has mild evidence of volume overload. She is currently been taking 10 mg of Lasix a day. She has been very sensitive to Lasix in the past. I've asked her to increase her dose of Lasix to 20 mg daily on Monday, Wednesday, and Friday. She will take 10 mg a day the other days a week. We'll consider additional up titration of her diuretic as needed.

## 2013-03-22 NOTE — Assessment & Plan Note (Signed)
Her blood pressure is slightly elevated. Hopefully with additional Lasix it will be better. I've asked the patient to reduce her dose of metoprolol to 25 mg, half tablet twice daily. She will reduce her salt intake as well.

## 2013-04-12 DIAGNOSIS — M1990 Unspecified osteoarthritis, unspecified site: Secondary | ICD-10-CM | POA: Diagnosis not present

## 2013-04-12 DIAGNOSIS — M2550 Pain in unspecified joint: Secondary | ICD-10-CM | POA: Diagnosis not present

## 2013-04-12 DIAGNOSIS — M19049 Primary osteoarthritis, unspecified hand: Secondary | ICD-10-CM | POA: Diagnosis not present

## 2013-04-12 DIAGNOSIS — R5381 Other malaise: Secondary | ICD-10-CM | POA: Diagnosis not present

## 2013-04-12 DIAGNOSIS — M810 Age-related osteoporosis without current pathological fracture: Secondary | ICD-10-CM | POA: Diagnosis not present

## 2013-04-12 DIAGNOSIS — R5383 Other fatigue: Secondary | ICD-10-CM | POA: Diagnosis not present

## 2013-04-12 DIAGNOSIS — M2010 Hallux valgus (acquired), unspecified foot: Secondary | ICD-10-CM | POA: Diagnosis not present

## 2013-04-12 DIAGNOSIS — M19079 Primary osteoarthritis, unspecified ankle and foot: Secondary | ICD-10-CM | POA: Diagnosis not present

## 2013-04-13 ENCOUNTER — Telehealth: Payer: Self-pay | Admitting: Neurology

## 2013-04-14 MED ORDER — DONEPEZIL HCL 10 MG PO TABS: 10.0000 mg | ORAL_TABLET | Freq: Every day | ORAL | Status: DC

## 2013-04-14 NOTE — Telephone Encounter (Signed)
Rx sent to last until appt.  [facility] patient assigned to Dr Brett Fairy.

## 2013-04-28 ENCOUNTER — Telehealth: Payer: Self-pay | Admitting: Internal Medicine

## 2013-04-28 NOTE — Telephone Encounter (Signed)
Patient called, because the prescription for Pradaxa 75 mg cost her $ 250 for 30 days . Pt states she can't afford to pay that much money for this medication. Pt would like to know if there is an assistance program for this medication or does Md  needs to prescribe a medication that she can afford. A 24 days 75 mg Pradaxa twice a day, sample capsules placed at the front desk for pt. Pt aware.

## 2013-04-28 NOTE — Telephone Encounter (Signed)
New problem   Pt calling because she went to pickup her prescription for pradaxa but stated it was very expensive(@ $250) and wants to know if we could provide her with coupons or some type of assistance so she can purchase her medicine

## 2013-04-29 IMAGING — CR DG ANKLE 2V *L*
2 series · 2 of 2 positions shown · non-contrast
Comparison: None.

CLINICAL DATA: Fall.  Ankle injury. Lateral ankle pain and
swelling.

LEFT ANKLE - 2 VIEW

[view not recorded (1 of 2)]
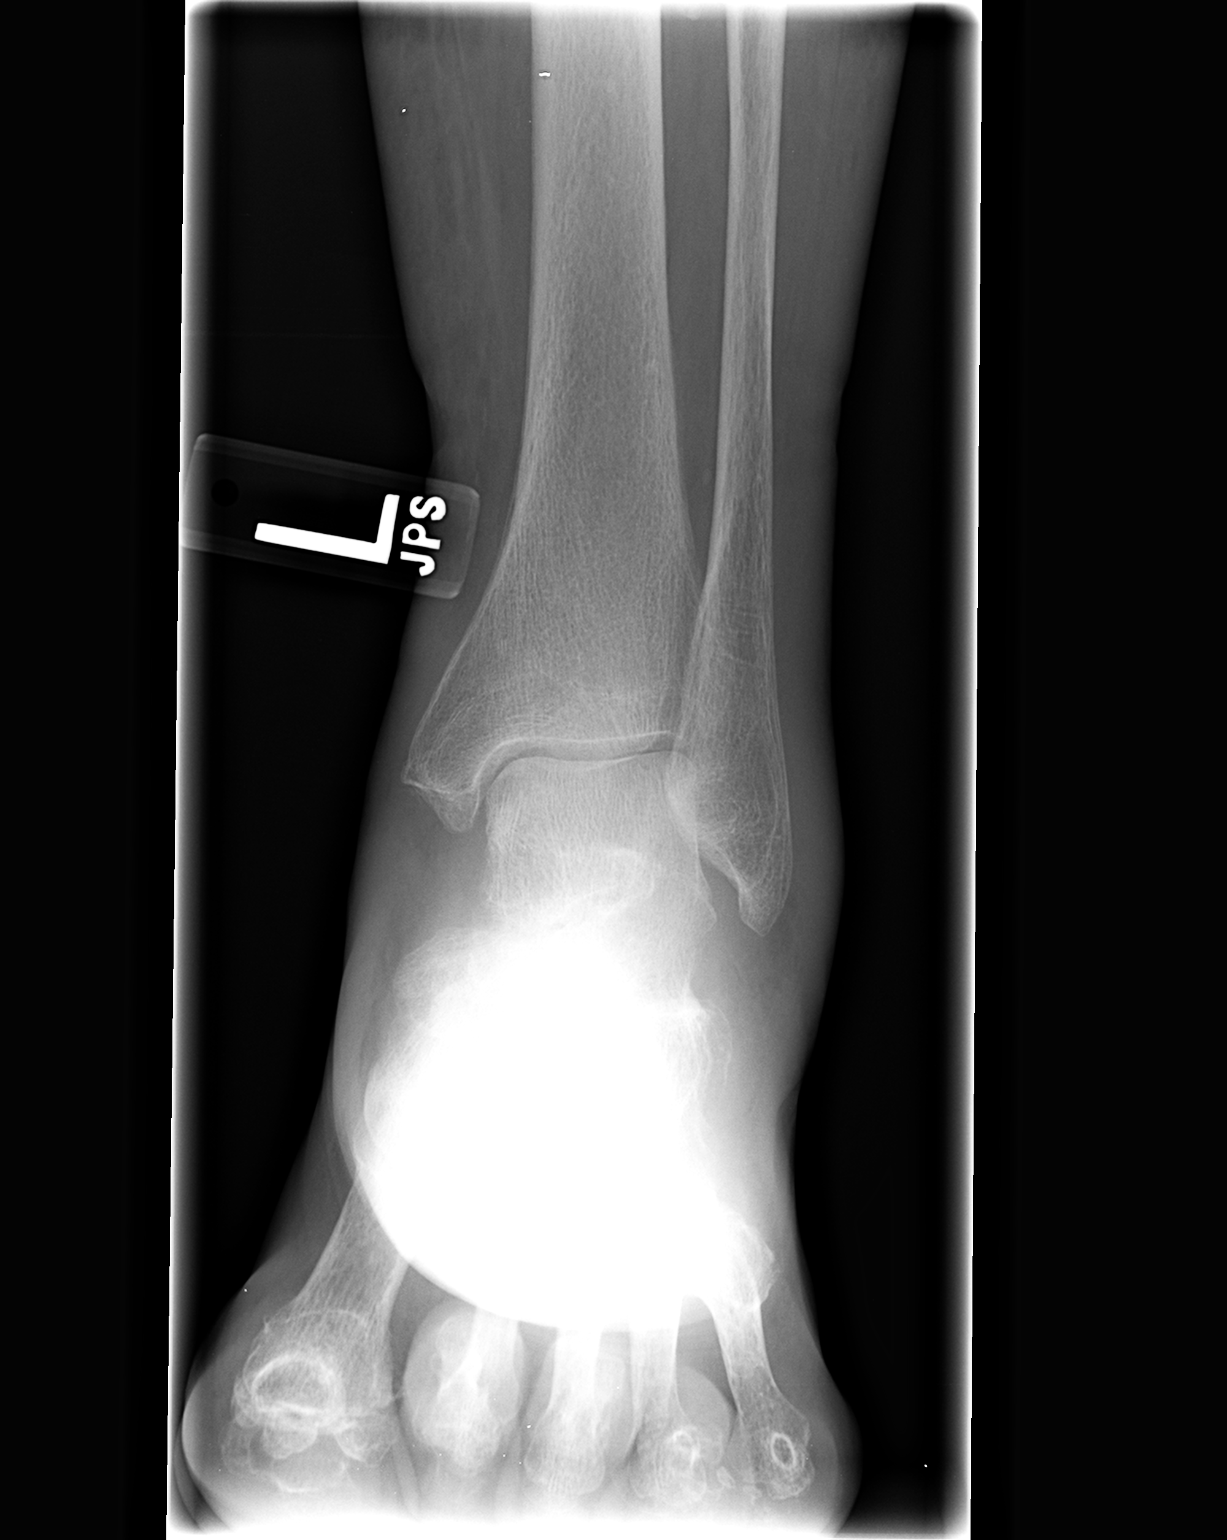

[view not recorded (2 of 2)]
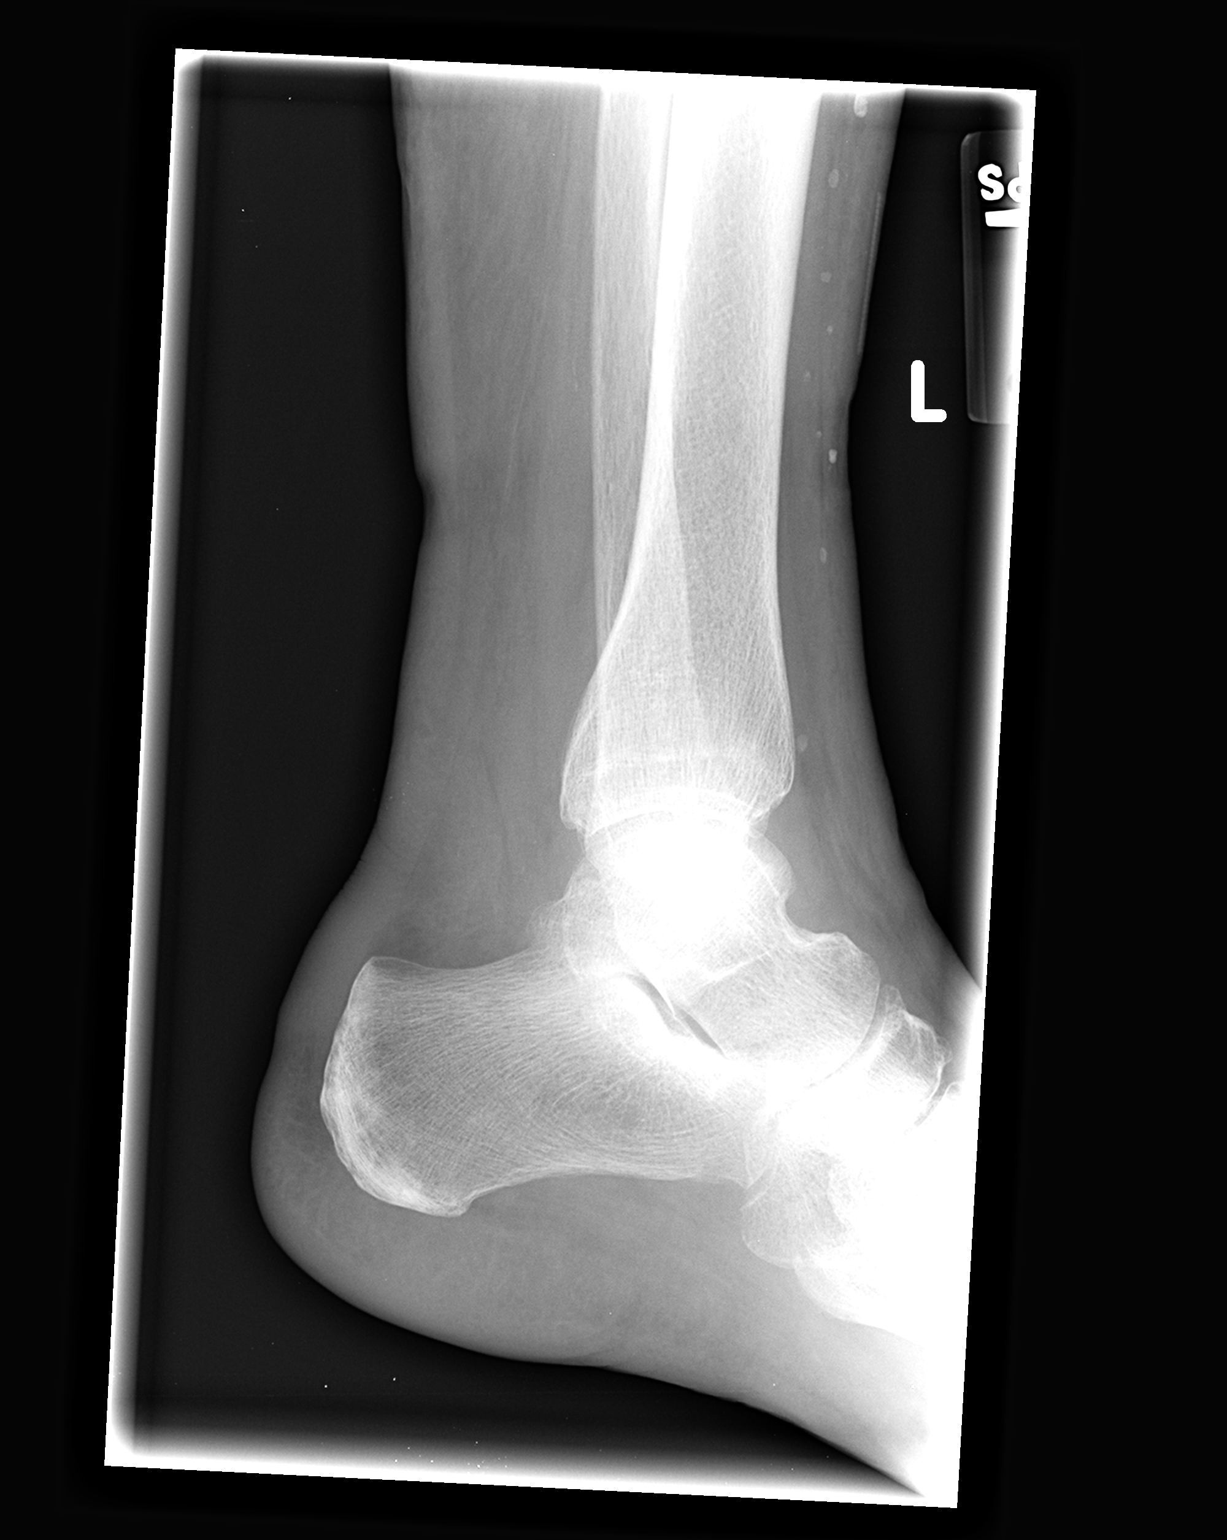

[2 of 2 positions shown; findings below may reference images not displayed]

FINDINGS: Soft tissue swelling is seen, most prominent overlying
the lateral malleolus.  Ankle joint effusion also noted.

There is no evidence of fracture or dislocation. No other bone
abnormality identified.  Scattered phleboliths are incidentally
noted in the anterior soft tissues of the lower leg.
IMPRESSION: Soft tissue swelling and ankle joint effusion.  No evidence of
fracture or dislocation.

## 2013-05-24 ENCOUNTER — Encounter: Payer: Self-pay | Admitting: Neurology

## 2013-05-24 ENCOUNTER — Ambulatory Visit (INDEPENDENT_AMBULATORY_CARE_PROVIDER_SITE_OTHER): Payer: Medicare Other | Admitting: Neurology

## 2013-05-24 VITALS — BP 152/79 | HR 65 | Ht <= 58 in | Wt 112.0 lb

## 2013-05-24 DIAGNOSIS — R413 Other amnesia: Secondary | ICD-10-CM

## 2013-05-24 HISTORY — DX: Other amnesia: R41.3

## 2013-05-24 MED ORDER — DONEPEZIL HCL 10 MG PO TABS: 10.0000 mg | ORAL_TABLET | Freq: Every day | ORAL | Status: DC

## 2013-05-24 NOTE — Progress Notes (Signed)
Reason for visit: Memory disturbance  [name] is an 79 y.o. female  History of present illness:  [name] is an 79 year old right-handed white female with a history of mild cognitive impairment. The patient has been very stable on her Aricept since 2007. The patient is scoring 30/30 on Mini-Mental status examinations. The patient lives independently, drives a car, patient feels, shots all independently. The patient one son living in [place], [place], and one son living in [place]. The patient is tolerating Aricept very well. The patient did not wish to come off of Aricept when last seen. The patient denies any significant new medical issues that have come up since last seen.  Past Medical History  Diagnosis Date  . Hypercholesteremia   . Coronary artery disease   . Dementia in Alzheimer's disease with early onset   . Osteoporosis   . Atrial fibrillation   . Arthritis     severe  . Stroke 2007    mini stroke or TIA with aphasia   . Hypertension     x 18 years  . Heart disease   . Congestive heart failure 2012  . Hyperlipemia     x 18 years  . Memory deficit 05/24/2013  . Degenerative arthritis     Past Surgical History  Procedure Laterality Date  . Replacement total knee bilateral Right 1999  . Right shoulder replacement  2009  . Right intertrochanteric hip fracture status post orif  August 2012  . Femur fracture surgery  8/12  . Cataract extraction  2009    ou  . Left knee  2001    Family History  Problem Relation Age of Onset  . Coronary artery disease    . Dementia Mother   . Stroke Mother   . Cancer - Colon Father     Social history:  reports that she has never smoked. She has never used smokeless tobacco. She reports that she drinks about 0.6 ounces of alcohol per week. She reports that she does not use illicit drugs.  Allergies:  Allergies  Allergen Reactions  . Codeine Nausea Only    Medications:  Current Outpatient Prescriptions on File Prior  to Visit  Medication Sig Dispense Refill  . celecoxib (CELEBREX) 100 MG capsule Take 1 capsule (100 mg total) by mouth daily.  90 capsule  2  . furosemide (LASIX) 20 MG tablet Take 1 tablet on Monday, [facility] Friday.  All other days take 1/2 tablet  30 tablet  6  . metoprolol tartrate (LOPRESSOR) 25 MG tablet Take 0.5 tablets (12.5 mg total) by mouth 2 (two) times daily.  180 tablet  1  . PRADAXA 75 MG CAPS TAKE ONE CAPSULE BY MOUTH EVERY 12 HOURS  60 capsule  6  . simvastatin (ZOCOR) 10 MG tablet Take 1 tablet (10 mg total) by mouth at bedtime.  30 tablet  11  . temazepam (RESTORIL) 30 MG capsule Take 30 mg by mouth at bedtime as needed.        . traMADol (ULTRAM) 50 MG tablet Take 50 mg by mouth every 6 (six) hours as needed.       . potassium chloride (K-DUR) 10 MEQ tablet Take 2 tablets (20 mEq total) by mouth daily.  180 tablet  2   No current facility-administered medications on file prior to visit.    ROS:  Out of a complete 14 system review of symptoms, the patient complains only of the following symptoms, and all other reviewed systems  are negative.  Loss of vision Shortness of breath Easy bruising Joint pain, joint swelling Swelling in the legs Decreased energy  Blood pressure 152/79, pulse 65, height 4\' 10"  (1.473 m), weight 112 lb (50.803 kg).  Physical Exam  General: The patient is alert and cooperative at the time of the examination.  Skin: 2+ edema is noted in the lower extremities below the knees.   Neurologic Exam  Mental status: Mini-Mental status examination done today shows a total score 28/30. The patient is able to name 12 animals in 60 seconds.  Cranial nerves: Facial symmetry is present. Speech is normal, no aphasia or dysarthria is noted. Extraocular movements are full. Visual fields are full.  Motor: The patient has good strength in all 4 extremities.  Coordination: The patient has good finger-nose-finger and heel-to-shin bilaterally.  Gait  and station: The patient has a slightly wide-based, unsteady gait. Tandem gait was not attempted. Romberg is negative. No drift is seen.  Reflexes: Deep tendon reflexes are symmetric.   Assessment/Plan:  1. Minimum cognitive impairment  2. Degenerative arthritis  The patient will continue the Aricept for now. The patient does not wish to come off of this medication. The patient is tolerating the drug well. The patient followup in one year.  Jill Alexanders MD 05/24/2013 7:37 PM  [facility] [address] [place], [place] [address]  Phone [phone] Fax [phone]

## 2013-05-30 DIAGNOSIS — M1990 Unspecified osteoarthritis, unspecified site: Secondary | ICD-10-CM | POA: Diagnosis not present

## 2013-05-30 DIAGNOSIS — M19049 Primary osteoarthritis, unspecified hand: Secondary | ICD-10-CM | POA: Diagnosis not present

## 2013-05-30 DIAGNOSIS — M2550 Pain in unspecified joint: Secondary | ICD-10-CM | POA: Diagnosis not present

## 2013-05-30 DIAGNOSIS — Z79899 Other long term (current) drug therapy: Secondary | ICD-10-CM | POA: Diagnosis not present

## 2013-06-15 ENCOUNTER — Ambulatory Visit: Payer: Self-pay | Admitting: Neurology

## 2013-06-20 ENCOUNTER — Other Ambulatory Visit: Payer: Self-pay | Admitting: Internal Medicine

## 2013-06-20 DIAGNOSIS — H353190 Nonexudative age-related macular degeneration, unspecified eye, stage unspecified: Secondary | ICD-10-CM | POA: Diagnosis not present

## 2013-06-27 ENCOUNTER — Telehealth: Payer: Self-pay | Admitting: Internal Medicine

## 2013-06-27 NOTE — Telephone Encounter (Signed)
New Prob     Pt has some questions regarding her METOPROLOL dosage and directions. Please call.

## 2013-06-27 NOTE — Telephone Encounter (Signed)
Pt. Called because she is taking Metoprolol 25 mg 1/2 tablet (12.5 mg ) twice a day. Pt said that it is  hard for her  to cut the pill in half, because every time, it will cut uneven. Pt would like to know if she can take the whole pill (25 mg) in the AM instead of cut it in half. Pt has an appointment with Dr. Lovena Le this coming Friday she said  will asked Dr. Lovena Le then.

## 2013-06-30 ENCOUNTER — Ambulatory Visit (INDEPENDENT_AMBULATORY_CARE_PROVIDER_SITE_OTHER): Payer: Medicare Other | Admitting: Internal Medicine

## 2013-06-30 ENCOUNTER — Encounter: Payer: Self-pay | Admitting: Internal Medicine

## 2013-06-30 VITALS — BP 140/72 | HR 60 | Ht 59.0 in | Wt 110.6 lb

## 2013-06-30 DIAGNOSIS — I4891 Unspecified atrial fibrillation: Secondary | ICD-10-CM | POA: Diagnosis not present

## 2013-06-30 DIAGNOSIS — I5031 Acute diastolic (congestive) heart failure: Secondary | ICD-10-CM

## 2013-06-30 DIAGNOSIS — I10 Essential (primary) hypertension: Secondary | ICD-10-CM

## 2013-06-30 DIAGNOSIS — I480 Paroxysmal atrial fibrillation: Secondary | ICD-10-CM

## 2013-06-30 MED ORDER — ATENOLOL 25 MG PO TABS: 25.0000 mg | ORAL_TABLET | Freq: Every day | ORAL | Status: DC

## 2013-06-30 NOTE — Telephone Encounter (Signed)
Saw Dr Lovena Le on 8/29 and we changed her medication to Atenolol

## 2013-06-30 NOTE — Patient Instructions (Addendum)
Your physician wants you to follow-up in: 12 months with Dr. Taylor. You will receive a reminder letter in the mail two months in advance. If you don't receive a letter, please call our office to schedule the follow-up appointment.    

## 2013-06-30 NOTE — Progress Notes (Signed)
HPI [name] returns today for followup. She very pleasant: Woman with a history of paroxysmal atrial fibrillation, dyslipidemia, hypertension, and diastolic heart failure. The patient has been diagnosed with rheumatoid arthritis. Her atrial arrhythmias appear to be well-controlled. She denies chest pain, shortness of breath, or syncope. She is bothered by arthritis. She has had trouble cutting her medications. Allergies  Allergen Reactions  . Codeine Nausea Only     Current Outpatient Prescriptions  Medication Sig Dispense Refill  . celecoxib (CELEBREX) 100 MG capsule Take 1 capsule (100 mg total) by mouth daily.  90 capsule  2  . donepezil (ARICEPT) 10 MG tablet Take 1 tablet (10 mg total) by mouth daily.  30 tablet  5  . furosemide (LASIX) 20 MG tablet Take 1 tablet on Monday, [facility] Friday.  All other days take 1/2 tablet  30 tablet  6  . hydroxychloroquine (PLAQUENIL) 200 MG tablet       . metoprolol tartrate (LOPRESSOR) 25 MG tablet Take 0.5 tablets (12.5 mg total) by mouth 2 (two) times daily.  180 tablet  1  . potassium chloride (K-DUR) 10 MEQ tablet Take 2 tablets (20 mEq total) by mouth daily.  180 tablet  2  . PRADAXA 75 MG CAPS capsule TAKE ONE CAPSULE BY MOUTH EVERY 12 HOURS  60 capsule  6  . simvastatin (ZOCOR) 10 MG tablet Take 1 tablet (10 mg total) by mouth at bedtime.  30 tablet  11  . temazepam (RESTORIL) 30 MG capsule Take 30 mg by mouth at bedtime as needed.        . traMADol (ULTRAM) 50 MG tablet Take 50 mg by mouth every 6 (six) hours as needed.        No current facility-administered medications for this visit.     Past Medical History  Diagnosis Date  . Hypercholesteremia   . Coronary artery disease   . Dementia in Alzheimer's disease with early onset   . Osteoporosis   . Atrial fibrillation   . Arthritis     severe  . Stroke 2007    mini stroke or TIA with aphasia   . Hypertension     x 18 years  . Heart disease   . Congestive heart failure 2012   . Hyperlipemia     x 18 years  . Memory deficit 05/24/2013  . Degenerative arthritis     ROS:   All systems reviewed and negative except as noted in the HPI.   Past Surgical History  Procedure Laterality Date  . Replacement total knee bilateral Right 1999  . Right shoulder replacement  2009  . Right intertrochanteric hip fracture status post orif  August 2012  . Femur fracture surgery  8/12  . Cataract extraction  2009    ou  . Left knee  2001     Family History  Problem Relation Age of Onset  . Coronary artery disease    . Dementia Mother   . Stroke Mother   . Cancer - Colon Father      History   Social History  . Marital Status: Widowed    Spouse Name: N/A    Number of Children: 2  . Years of Education: MA   Occupational History  . RETIRED    Social History Main Topics  . Smoking status: Never Smoker   . Smokeless tobacco: Never Used  . Alcohol Use: 0.6 oz/week    1 Glasses of wine per week     Comment: 1  glass wine with dinner some days  . Drug Use: No  . Sexual Activity: Not on file   Other Topics Concern  . Not on file   Social History Narrative  . No narrative on file     BP 140/72  Pulse 60  Ht 4\' 11"  (1.499 m)  Wt 110 lb 9.6 oz (50.168 kg)  BMI 22.33 kg/m2  Physical Exam:  Well appearing elderly woman, NAD HEENT: Unremarkable Neck:  No JVD, no thyromegally Back:  No CVA tenderness Lungs:  Clear with no wheezes, rales, or rhonchi. HEART:  Regular rate rhythm, no murmurs, no rubs, no clicks Abd:  soft, positive bowel sounds, no organomegally, no rebound, no guarding Ext:  2 plus pulses, no edema, no cyanosis, no clubbing Skin:  No rashes, multiple nodules on the hands, on multiple joints. Neuro:  CN II through XII intact, motor grossly intact    Assess/Plan:

## 2013-06-30 NOTE — Assessment & Plan Note (Signed)
She will continue her current medical therapy, and maintain a low-sodium diet.

## 2013-06-30 NOTE — Addendum Note (Signed)
Addended by: Janan Halter F on: 06/30/2013 12:07 PM   Modules accepted: Orders, Medications

## 2013-06-30 NOTE — Assessment & Plan Note (Signed)
Her blood pressure is fairly well controlled. Because she has had trouble splitting her dose of metoprolol, I will switch her to atenolol 25 mg daily.

## 2013-06-30 NOTE — Assessment & Plan Note (Signed)
Her atrial arrhythmias are well-controlled. No change in medical therapy at this time.

## 2013-07-26 DIAGNOSIS — Z23 Encounter for immunization: Secondary | ICD-10-CM | POA: Diagnosis not present

## 2013-07-28 ENCOUNTER — Telehealth: Payer: Self-pay | Admitting: Internal Medicine

## 2013-07-28 NOTE — Telephone Encounter (Signed)
Left message for patient to return call and let me know if she needs samples left out front

## 2013-07-28 NOTE — Telephone Encounter (Signed)
New problem      Patient calling stating she Receive a call  Regarding  samples of praxada .

## 2013-08-07 DIAGNOSIS — E782 Mixed hyperlipidemia: Secondary | ICD-10-CM | POA: Diagnosis not present

## 2013-08-07 DIAGNOSIS — M810 Age-related osteoporosis without current pathological fracture: Secondary | ICD-10-CM | POA: Diagnosis not present

## 2013-08-07 DIAGNOSIS — L659 Nonscarring hair loss, unspecified: Secondary | ICD-10-CM | POA: Diagnosis not present

## 2013-08-07 DIAGNOSIS — I10 Essential (primary) hypertension: Secondary | ICD-10-CM | POA: Diagnosis not present

## 2013-08-21 DIAGNOSIS — M810 Age-related osteoporosis without current pathological fracture: Secondary | ICD-10-CM | POA: Diagnosis not present

## 2013-08-21 DIAGNOSIS — Z79899 Other long term (current) drug therapy: Secondary | ICD-10-CM | POA: Diagnosis not present

## 2013-08-28 DIAGNOSIS — M1990 Unspecified osteoarthritis, unspecified site: Secondary | ICD-10-CM | POA: Diagnosis not present

## 2013-08-28 DIAGNOSIS — Z79899 Other long term (current) drug therapy: Secondary | ICD-10-CM | POA: Diagnosis not present

## 2013-08-28 DIAGNOSIS — M2550 Pain in unspecified joint: Secondary | ICD-10-CM | POA: Diagnosis not present

## 2013-08-28 DIAGNOSIS — M19049 Primary osteoarthritis, unspecified hand: Secondary | ICD-10-CM | POA: Diagnosis not present

## 2013-09-13 ENCOUNTER — Other Ambulatory Visit: Payer: Self-pay | Admitting: Orthopedic Surgery

## 2013-09-13 ENCOUNTER — Ambulatory Visit
Admission: RE | Admit: 2013-09-13 | Discharge: 2013-09-13 | Disposition: A | Discharge status: Home / Self Care | Payer: Medicare Other | Source: Ambulatory Visit | Attending: Orthopedic Surgery | Admitting: Orthopedic Surgery

## 2013-09-13 DIAGNOSIS — M25579 Pain in unspecified ankle and joints of unspecified foot: Secondary | ICD-10-CM | POA: Diagnosis not present

## 2013-09-13 DIAGNOSIS — R609 Edema, unspecified: Secondary | ICD-10-CM

## 2013-09-13 DIAGNOSIS — M79609 Pain in unspecified limb: Secondary | ICD-10-CM | POA: Diagnosis not present

## 2013-09-13 DIAGNOSIS — M7989 Other specified soft tissue disorders: Secondary | ICD-10-CM | POA: Diagnosis not present

## 2013-09-13 DIAGNOSIS — L02619 Cutaneous abscess of unspecified foot: Secondary | ICD-10-CM | POA: Diagnosis not present

## 2013-09-20 DIAGNOSIS — L02619 Cutaneous abscess of unspecified foot: Secondary | ICD-10-CM | POA: Diagnosis not present

## 2013-10-11 ENCOUNTER — Telehealth: Payer: Self-pay | Admitting: Internal Medicine

## 2013-10-11 ENCOUNTER — Telehealth: Payer: Self-pay | Admitting: *Deleted

## 2013-10-11 NOTE — Telephone Encounter (Signed)
New Problem:  Pt states she is wanting to know what the doctor's opinion is on her changing her Celebrex 100 mg to [employer]. Pt states she is changing these b/c of her insurance. Pt wants to know if that's ok with the doctor.

## 2013-10-11 NOTE — Telephone Encounter (Signed)
Patient requests pradaxa samples. She is aware that they will left at the front desk for pick up.

## 2013-10-11 NOTE — Telephone Encounter (Signed)
Spoke with patient and let her know that these medications were similar and should be okay

## 2013-11-06 DIAGNOSIS — M1990 Unspecified osteoarthritis, unspecified site: Secondary | ICD-10-CM | POA: Diagnosis not present

## 2013-11-06 DIAGNOSIS — M810 Age-related osteoporosis without current pathological fracture: Secondary | ICD-10-CM | POA: Diagnosis not present

## 2013-11-06 DIAGNOSIS — M2550 Pain in unspecified joint: Secondary | ICD-10-CM | POA: Diagnosis not present

## 2013-11-06 DIAGNOSIS — M19049 Primary osteoarthritis, unspecified hand: Secondary | ICD-10-CM | POA: Diagnosis not present

## 2013-12-12 ENCOUNTER — Telehealth: Payer: Self-pay | Admitting: *Deleted

## 2013-12-12 NOTE — Telephone Encounter (Signed)
Patient states needs PA to go to [facility] for pradaxa, faxed over PA completed

## 2013-12-14 NOTE — Telephone Encounter (Signed)
[facility] approved Pradaxa 75 mg through 12/11/2014, [place] # [id number]

## 2013-12-20 ENCOUNTER — Other Ambulatory Visit: Payer: Self-pay | Admitting: Internal Medicine

## 2013-12-21 ENCOUNTER — Other Ambulatory Visit: Payer: Self-pay

## 2013-12-21 MED ORDER — DONEPEZIL HCL 10 MG PO TABS: 10.0000 mg | ORAL_TABLET | Freq: Every day | ORAL | Status: DC

## 2014-01-11 DIAGNOSIS — H04129 Dry eye syndrome of unspecified lacrimal gland: Secondary | ICD-10-CM | POA: Diagnosis not present

## 2014-01-11 DIAGNOSIS — H353190 Nonexudative age-related macular degeneration, unspecified eye, stage unspecified: Secondary | ICD-10-CM | POA: Diagnosis not present

## 2014-01-17 DIAGNOSIS — J342 Deviated nasal septum: Secondary | ICD-10-CM | POA: Diagnosis not present

## 2014-01-17 DIAGNOSIS — J310 Chronic rhinitis: Secondary | ICD-10-CM | POA: Diagnosis not present

## 2014-01-17 DIAGNOSIS — H6120 Impacted cerumen, unspecified ear: Secondary | ICD-10-CM | POA: Diagnosis not present

## 2014-01-22 ENCOUNTER — Other Ambulatory Visit: Payer: Self-pay | Admitting: *Deleted

## 2014-01-22 DIAGNOSIS — I509 Heart failure, unspecified: Secondary | ICD-10-CM

## 2014-01-22 MED ORDER — FUROSEMIDE 20 MG PO TABS: ORAL_TABLET | ORAL | Status: DC

## 2014-02-12 DIAGNOSIS — Z0000 Encounter for general adult medical examination without abnormal findings: Secondary | ICD-10-CM | POA: Diagnosis not present

## 2014-02-12 DIAGNOSIS — R030 Elevated blood-pressure reading, without diagnosis of hypertension: Secondary | ICD-10-CM | POA: Diagnosis not present

## 2014-02-12 DIAGNOSIS — Z23 Encounter for immunization: Secondary | ICD-10-CM | POA: Diagnosis not present

## 2014-02-12 DIAGNOSIS — M1990 Unspecified osteoarthritis, unspecified site: Secondary | ICD-10-CM | POA: Diagnosis not present

## 2014-02-12 DIAGNOSIS — Z79899 Other long term (current) drug therapy: Secondary | ICD-10-CM | POA: Diagnosis not present

## 2014-02-12 DIAGNOSIS — R198 Other specified symptoms and signs involving the digestive system and abdomen: Secondary | ICD-10-CM | POA: Diagnosis not present

## 2014-02-12 DIAGNOSIS — Z1331 Encounter for screening for depression: Secondary | ICD-10-CM | POA: Diagnosis not present

## 2014-02-15 DIAGNOSIS — Z79899 Other long term (current) drug therapy: Secondary | ICD-10-CM | POA: Diagnosis not present

## 2014-03-07 DIAGNOSIS — M19049 Primary osteoarthritis, unspecified hand: Secondary | ICD-10-CM | POA: Diagnosis not present

## 2014-03-07 DIAGNOSIS — M795 Residual foreign body in soft tissue: Secondary | ICD-10-CM | POA: Diagnosis not present

## 2014-03-13 DIAGNOSIS — M810 Age-related osteoporosis without current pathological fracture: Secondary | ICD-10-CM | POA: Diagnosis not present

## 2014-03-13 DIAGNOSIS — Z79899 Other long term (current) drug therapy: Secondary | ICD-10-CM | POA: Diagnosis not present

## 2014-04-20 ENCOUNTER — Other Ambulatory Visit: Payer: Self-pay | Admitting: Internal Medicine

## 2014-05-03 ENCOUNTER — Other Ambulatory Visit: Payer: Self-pay | Admitting: Internal Medicine

## 2014-05-14 DIAGNOSIS — Z79899 Other long term (current) drug therapy: Secondary | ICD-10-CM | POA: Diagnosis not present

## 2014-05-14 DIAGNOSIS — M19049 Primary osteoarthritis, unspecified hand: Secondary | ICD-10-CM | POA: Diagnosis not present

## 2014-05-14 DIAGNOSIS — M2550 Pain in unspecified joint: Secondary | ICD-10-CM | POA: Diagnosis not present

## 2014-05-14 DIAGNOSIS — M1990 Unspecified osteoarthritis, unspecified site: Secondary | ICD-10-CM | POA: Diagnosis not present

## 2014-05-24 ENCOUNTER — Ambulatory Visit: Payer: Medicare Other | Admitting: Neurology

## 2014-06-15 DIAGNOSIS — Z79899 Other long term (current) drug therapy: Secondary | ICD-10-CM | POA: Diagnosis not present

## 2014-06-15 DIAGNOSIS — I10 Essential (primary) hypertension: Secondary | ICD-10-CM | POA: Diagnosis not present

## 2014-06-15 DIAGNOSIS — M810 Age-related osteoporosis without current pathological fracture: Secondary | ICD-10-CM | POA: Diagnosis not present

## 2014-06-25 ENCOUNTER — Ambulatory Visit: Payer: Medicare Other | Admitting: Neurology

## 2014-06-26 DIAGNOSIS — M810 Age-related osteoporosis without current pathological fracture: Secondary | ICD-10-CM | POA: Diagnosis not present

## 2014-07-02 ENCOUNTER — Other Ambulatory Visit: Payer: Self-pay | Admitting: Internal Medicine

## 2014-07-19 DIAGNOSIS — H353190 Nonexudative age-related macular degeneration, unspecified eye, stage unspecified: Secondary | ICD-10-CM | POA: Diagnosis not present

## 2014-07-25 DIAGNOSIS — R079 Chest pain, unspecified: Secondary | ICD-10-CM | POA: Diagnosis not present

## 2014-08-01 ENCOUNTER — Telehealth: Payer: Self-pay | Admitting: Internal Medicine

## 2014-08-01 NOTE — Telephone Encounter (Signed)
Needs samples of Pradaxa

## 2014-08-01 NOTE — Telephone Encounter (Signed)
New message           Pt would like a call back from the nurse / pt did not disclose any other info

## 2014-08-01 NOTE — Telephone Encounter (Signed)
lmom for patient to return my call 

## 2014-08-01 NOTE — Telephone Encounter (Signed)
F/u ° ° °Pt returning your call °

## 2014-08-07 DIAGNOSIS — M810 Age-related osteoporosis without current pathological fracture: Secondary | ICD-10-CM | POA: Diagnosis not present

## 2014-08-07 DIAGNOSIS — M546 Pain in thoracic spine: Secondary | ICD-10-CM | POA: Diagnosis not present

## 2014-08-14 DIAGNOSIS — M810 Age-related osteoporosis without current pathological fracture: Secondary | ICD-10-CM | POA: Diagnosis not present

## 2014-08-14 DIAGNOSIS — M546 Pain in thoracic spine: Secondary | ICD-10-CM | POA: Diagnosis not present

## 2014-08-15 DIAGNOSIS — Z23 Encounter for immunization: Secondary | ICD-10-CM | POA: Diagnosis not present

## 2014-08-22 ENCOUNTER — Ambulatory Visit: Payer: Medicare Other | Admitting: Neurology

## 2014-08-22 DIAGNOSIS — M810 Age-related osteoporosis without current pathological fracture: Secondary | ICD-10-CM | POA: Diagnosis not present

## 2014-08-22 DIAGNOSIS — M546 Pain in thoracic spine: Secondary | ICD-10-CM | POA: Diagnosis not present

## 2014-08-24 ENCOUNTER — Other Ambulatory Visit: Payer: Self-pay | Admitting: Internal Medicine

## 2014-08-31 ENCOUNTER — Other Ambulatory Visit: Payer: Self-pay | Admitting: Internal Medicine

## 2014-09-04 DIAGNOSIS — M810 Age-related osteoporosis without current pathological fracture: Secondary | ICD-10-CM | POA: Diagnosis not present

## 2014-09-04 DIAGNOSIS — M546 Pain in thoracic spine: Secondary | ICD-10-CM | POA: Diagnosis not present

## 2014-09-11 DIAGNOSIS — M546 Pain in thoracic spine: Secondary | ICD-10-CM | POA: Diagnosis not present

## 2014-09-11 DIAGNOSIS — M810 Age-related osteoporosis without current pathological fracture: Secondary | ICD-10-CM | POA: Diagnosis not present

## 2014-09-18 DIAGNOSIS — M810 Age-related osteoporosis without current pathological fracture: Secondary | ICD-10-CM | POA: Diagnosis not present

## 2014-09-18 DIAGNOSIS — M546 Pain in thoracic spine: Secondary | ICD-10-CM | POA: Diagnosis not present

## 2014-09-19 ENCOUNTER — Encounter: Payer: Self-pay | Admitting: Neurology

## 2014-09-21 ENCOUNTER — Telehealth: Payer: Self-pay | Admitting: Neurology

## 2014-09-21 NOTE — Telephone Encounter (Signed)
Patient calling to speak with Dr. Jannifer Franklin regarding coming off of her Donepezil medication, per Dr. Carlyle Lipa request, wants to know if she can get this answered by the weekend, please return call and advise.

## 2014-09-21 NOTE — Telephone Encounter (Signed)
I called the patient. She is having GI upset on Aricept. She is to cut back to 5 mg at night. I will see her in December.

## 2014-09-21 NOTE — Telephone Encounter (Signed)
I called the patient.  I left a message.  I will call back later. 

## 2014-09-25 ENCOUNTER — Encounter: Payer: Self-pay | Admitting: Neurology

## 2014-09-28 ENCOUNTER — Other Ambulatory Visit: Payer: Self-pay | Admitting: Internal Medicine

## 2014-10-03 ENCOUNTER — Encounter: Payer: Self-pay | Admitting: Neurology

## 2014-10-03 ENCOUNTER — Ambulatory Visit (INDEPENDENT_AMBULATORY_CARE_PROVIDER_SITE_OTHER): Payer: Medicare Other | Admitting: Neurology

## 2014-10-03 ENCOUNTER — Other Ambulatory Visit: Payer: Self-pay | Admitting: Internal Medicine

## 2014-10-03 ENCOUNTER — Telehealth: Payer: Self-pay | Admitting: Neurology

## 2014-10-03 VITALS — BP 161/71 | HR 62 | Ht 59.0 in | Wt 113.8 lb

## 2014-10-03 DIAGNOSIS — R413 Other amnesia: Secondary | ICD-10-CM

## 2014-10-03 MED ORDER — TEMAZEPAM 15 MG PO CAPS: 15.0000 mg | ORAL_CAPSULE | Freq: Every evening | ORAL | Status: DC | PRN

## 2014-10-03 MED ORDER — SUVOREXANT 10 MG PO TABS: 10.0000 mg | ORAL_TABLET | Freq: Every evening | ORAL | Status: DC | PRN

## 2014-10-03 MED ORDER — CIPROFLOXACIN HCL 0.3 % OP SOLN: 2.0000 [drp] | OPHTHALMIC | Status: DC

## 2014-10-03 MED ORDER — RIVASTIGMINE 4.6 MG/24HR TD PT24: 4.6000 mg | MEDICATED_PATCH | Freq: Every day | TRANSDERMAL | Status: DC

## 2014-10-03 NOTE — Progress Notes (Signed)
Reason for visit: Memory disorder  [name] is an 80 y.o. female  History of present illness:  [name] is an 80 year old right-handed white female with a history of a mild memory disorder. The patient has been on Aricept for number of years, but just recently she claims that this is now causing problems with diarrhea. The patient has tried to split the tablets taking 5 mg at night, but she still has some issues with this. The patient within the last 24 hours has developed what appears to be conjunctivitis in the left eye. She is having some discomfort and eye discharge associated with this. The patient also reports that she is having osteoporosis and alopecia issues, and she believes that the Aricept may be part of the etiology of this. She returns to this office for further evaluation. The patient is on temazepam taking 30 mg at night, she has been on this for several years. The patient wishes to come off the medication and switch to another type of sleeping medication.  Past Medical History  Diagnosis Date  . Hypercholesteremia   . Coronary artery disease   . Dementia in Alzheimer's disease with early onset   . Osteoporosis   . Atrial fibrillation   . Arthritis     severe  . Stroke 2007    mini stroke or TIA with aphasia   . Hypertension     x 18 years  . Heart disease   . Congestive heart failure 2012  . Hyperlipemia     x 18 years  . Memory deficit 05/24/2013  . Degenerative arthritis     Past Surgical History  Procedure Laterality Date  . Replacement total knee bilateral Right 1999  . Right shoulder replacement  2009  . Right intertrochanteric hip fracture status post orif  August 2012  . Femur fracture surgery  8/12  . Cataract extraction  2009    ou  . Left knee  2001    Family History  Problem Relation Age of Onset  . Coronary artery disease    . Dementia Mother   . Stroke Mother   . Cancer - Colon Father     Social history:  reports that she has never  smoked. She has never used smokeless tobacco. She reports that she drinks about 0.6 oz of alcohol per week. She reports that she does not use illicit drugs.    Allergies  Allergen Reactions  . Codeine Nausea Only    Medications:  Current Outpatient Prescriptions on File Prior to Visit  Medication Sig Dispense Refill  . furosemide (LASIX) 20 MG tablet TAKE 1 TABLET BY MOUTH ON MONDAY, WEDNESDAY AND FRIDAYS. TAKE 1/2 TABLET BY MOUTH ON ALL OTHER DAYS 30 tablet 0  . metoprolol tartrate (LOPRESSOR) 25 MG tablet TAKE 1/2 TABLET BY MOUTH TWICE DAILY 30 tablet 3  . PRADAXA 75 MG CAPS capsule TAKE 1 CAPSULES BY MOUTH EVERY 12 HOURS 60 capsule 0  . temazepam (RESTORIL) 30 MG capsule Take 30 mg by mouth at bedtime as needed.      . traMADol (ULTRAM) 50 MG tablet Take 50 mg by mouth every 6 (six) hours as needed.     . potassium chloride (K-DUR) 10 MEQ tablet Take 2 tablets (20 mEq total) by mouth daily. 180 tablet 2   No current facility-administered medications on file prior to visit.    ROS:  Out of a complete 14 system review of symptoms, the patient complains only of the  following symptoms, and all other reviewed systems are negative.  Activity change Runny nose, difficulty swallowing Light sensitivity Shortness of breath Leg swelling Heat intolerance, flushing Black stools, diarrhea Frequency of urination Weakness  Blood pressure 161/71, pulse 62, height 4\' 11"  (1.499 m), weight 113 lb 12.8 oz (51.619 kg).  Physical Exam  General: The patient is alert and cooperative at the time of the examination.  Skin: No significant peripheral edema is noted.   Neurologic Exam  Mental status: The patient is oriented x 3. Mini-Mental Status Examination done today shows a total score of 30/30.  Cranial nerves: Facial symmetry is present. Speech is normal, no aphasia or dysarthria is noted. Extraocular movements are full. Visual fields are full.  Motor: The patient has good strength in  all 4 extremities.  Sensory examination: Soft touch sensation is symmetric on the face, arms, and legs.  Coordination: The patient has good finger-nose-finger and heel-to-shin bilaterally.  Gait and station: The patient has a normal gait. Tandem gait is slightly unsteady. Romberg is negative. No drift is seen.  Reflexes: Deep tendon reflexes are symmetric.   Assessment/Plan:  1. Memory disturbance  The patient has a very mild, stable memory disturbance. The patient will come off of the Aricept given the history of diarrhea. She will be placed on an Exelon patch. If she does well with this, the patient will be continued on the medication. The patient was given a prescription for Cipro ophthalmic solution to treat the conjunctivitis. The patient will be given a prescription for Belsomra in low dose, 10 mg to add to a 15 mg dose of the temazepam for one month. The patient will then contact our office, and we will stop the temazepam, and increase the Belsomra to a 20 mg dose. The patient will follow-up in 6 months.  Jill Alexanders MD 10/03/2014 8:49 PM  [facility] [address] [place], [place] [address]  Phone [phone] Fax [phone]

## 2014-10-03 NOTE — Telephone Encounter (Signed)
Pt is calling back to update her medication list.  She states that the list she was given today was incorrect.  She says that there are duplicates of her med's. Please call back.

## 2014-10-04 ENCOUNTER — Other Ambulatory Visit: Payer: Self-pay | Admitting: Neurology

## 2014-10-04 NOTE — Telephone Encounter (Signed)
Prescribed today at [facility]

## 2014-10-04 NOTE — Telephone Encounter (Signed)
Left message for patient to call back and let us know which Temazepam strength she is taking.  We have a 15mg  and a 30mg  capsule listed.

## 2014-10-05 DIAGNOSIS — H00024 Hordeolum internum left upper eyelid: Secondary | ICD-10-CM | POA: Diagnosis not present

## 2014-10-09 DIAGNOSIS — H05019 Cellulitis of unspecified orbit: Secondary | ICD-10-CM | POA: Diagnosis not present

## 2014-10-11 NOTE — Telephone Encounter (Signed)
Left another message for patient to return call with the correct strength of Temazepam so it can be updated in her medication list.

## 2014-10-12 NOTE — Telephone Encounter (Signed)
Patient is calling back to advise that she is  taking Temazepam 15mg  along with Belsomra 10mg . Please call and discuss.Patient will be here this morning until 12:30pm and back at 2:30pm today. Thank you

## 2014-10-12 NOTE — Telephone Encounter (Signed)
Spoke to patient and she relayed that she is taking Temazepam 15mg  and Belsomra 10mg .  She wanted to know if there is an interaction between the 2 meds because she read that she shouldn't take 2 sleep medications together.  She also wanted to know how long she is going to do the 2 meds, and is the plan to do this long term.

## 2014-10-12 NOTE — Telephone Encounter (Signed)
I called the patient. We are and transition from temazepam to [place], we are doing this slowly by cutting the temazepam dose in half, and going on a low dose of the Belsomra. We will do this for one month, then we will convert to the Belsomra completely. The patient seems to understand this. She will call me in one month.

## 2014-11-03 ENCOUNTER — Other Ambulatory Visit: Payer: Self-pay | Admitting: Internal Medicine

## 2014-11-05 ENCOUNTER — Telehealth: Payer: Self-pay

## 2014-11-05 MED ORDER — SUVOREXANT 20 MG PO TABS: 20.0000 mg | ORAL_TABLET | Freq: Every day | ORAL | Status: DC

## 2014-11-05 NOTE — Telephone Encounter (Signed)
Patient called the office saying she has been transitioning to [place] for about 1 month.  Told the operator she has been doing well, with the exception of coughing/choking feeling in her throat at times.  She would like to know if she needs to remain on current dose, or if dose should be adjusted since she is off of Temazepam.  Please advise.  Thank you.

## 2014-11-05 NOTE — Telephone Encounter (Signed)
I called the patient. She has done well on a combination of Belsomra and temazepam. She will stop attempt has been, go up on the [place]. She has had some problem with cough, this could be related to the Power. She will contact me if this gets worse.

## 2014-11-05 NOTE — Telephone Encounter (Signed)
Patient has been transitioning from Temazepam to [place] for one month and has done ok doing this except for more coughing and a choking sensation in her throat. Patient has questions about the dosage of Belsomra because she only has medication for today. Please call patient and advise. It is ok to leave a message. Thank you.

## 2014-11-07 ENCOUNTER — Telehealth: Payer: Self-pay | Admitting: Internal Medicine

## 2014-11-07 NOTE — Telephone Encounter (Signed)
New message     Please call [employer] rx [phone] to give prior authorization on pradaxa cap 75mg .  Ref [id number].

## 2014-11-08 NOTE — Telephone Encounter (Signed)
PA for Pradaxa approved through 11/03/2015. [id number]. lmom for patient.

## 2014-11-08 NOTE — Telephone Encounter (Signed)
PA for Pradaxa sent via CoverMyMeds

## 2014-11-09 ENCOUNTER — Telehealth: Payer: Self-pay | Admitting: *Deleted

## 2014-11-09 MED ORDER — TEMAZEPAM 7.5 MG PO CAPS: 7.5000 mg | ORAL_CAPSULE | Freq: Every evening | ORAL | Status: DC | PRN

## 2014-11-09 NOTE — Telephone Encounter (Signed)
I called the patient. She is not sleeping well off of restoril. I will call in a 7.5 mg capsule. She will take this for 2 weeks, then we will try Belsomra alone. We may need to go up on the Belsomra dose in the future.

## 2014-11-09 NOTE — Telephone Encounter (Signed)
Patient is calling because she is sleep deprived. She is very restless and can not go another night without some help and sleep. She would like this addressed before it gets to late today.

## 2014-11-19 DIAGNOSIS — R2681 Unsteadiness on feet: Secondary | ICD-10-CM | POA: Diagnosis not present

## 2014-11-28 ENCOUNTER — Telehealth: Payer: Self-pay | Admitting: Neurology

## 2014-11-28 DIAGNOSIS — R2681 Unsteadiness on feet: Secondary | ICD-10-CM | POA: Diagnosis not present

## 2014-11-28 MED ORDER — RIVASTIGMINE TARTRATE 1.5 MG PO CAPS: 1.5000 mg | ORAL_CAPSULE | Freq: Two times a day (BID) | ORAL | Status: DC

## 2014-11-28 NOTE — Telephone Encounter (Signed)
I called patient. She is having sensitivity with the Exelon patch. The medication does, and oral form, but will need to be careful about producing diarrhea as the oral Aricept did. I will try her on low-dose Exelon tablets, 1.5 mg capsules

## 2014-11-28 NOTE — Telephone Encounter (Signed)
Pt is calling stating that rivastigmine (EXELON) 4.6 mg/24hr is making her breakout.  Also she states that her insurance does not cover the patch.  She was wondering if there is a pill that she could take in place of this.  Please call and advise, she does not want to break the cycle in taking this medication.

## 2014-12-02 ENCOUNTER — Other Ambulatory Visit: Payer: Self-pay | Admitting: Internal Medicine

## 2014-12-03 DIAGNOSIS — R2681 Unsteadiness on feet: Secondary | ICD-10-CM | POA: Diagnosis not present

## 2014-12-05 ENCOUNTER — Telehealth: Payer: Self-pay | Admitting: Neurology

## 2014-12-05 MED ORDER — SUVOREXANT 20 MG PO TABS: 20.0000 mg | ORAL_TABLET | Freq: Every evening | ORAL | Status: DC | PRN

## 2014-12-05 MED ORDER — TEMAZEPAM 7.5 MG PO CAPS: 7.5000 mg | ORAL_CAPSULE | Freq: Every evening | ORAL | Status: DC | PRN

## 2014-12-05 NOTE — Telephone Encounter (Signed)
I called patient. The patient is doing well on the combination of Belsomra and temazepam. We will try to drop off the Restoril now, and see how she does on Belsomra. If she does not seem to get good improvement, we may have to go back to the Restoril in low dose.  The patient was not taking the Exelon tablets properly, she was only taking one of the 1.5 mg tablets daily, she is to go to taking one twice a day.

## 2014-12-05 NOTE — Telephone Encounter (Signed)
Patient is calling. Patient has been taking Belsomra 20mg  and Temazepam 7.5mg  for one month. Should she continue taking these medications? If so please call to [facility] @ [employer]. Patient would like to wait 10-15 days to continue medications to see if it is helping,. Please call patient and advise. Thank you.

## 2014-12-12 DIAGNOSIS — R2681 Unsteadiness on feet: Secondary | ICD-10-CM | POA: Diagnosis not present

## 2014-12-14 DIAGNOSIS — R2681 Unsteadiness on feet: Secondary | ICD-10-CM | POA: Diagnosis not present

## 2014-12-18 DIAGNOSIS — R2681 Unsteadiness on feet: Secondary | ICD-10-CM | POA: Diagnosis not present

## 2014-12-24 ENCOUNTER — Other Ambulatory Visit: Payer: Self-pay | Admitting: Internal Medicine

## 2014-12-25 DIAGNOSIS — I4891 Unspecified atrial fibrillation: Secondary | ICD-10-CM | POA: Diagnosis not present

## 2014-12-25 DIAGNOSIS — I10 Essential (primary) hypertension: Secondary | ICD-10-CM | POA: Diagnosis not present

## 2014-12-25 DIAGNOSIS — R0789 Other chest pain: Secondary | ICD-10-CM | POA: Diagnosis not present

## 2014-12-25 DIAGNOSIS — Z79899 Other long term (current) drug therapy: Secondary | ICD-10-CM | POA: Diagnosis not present

## 2014-12-25 DIAGNOSIS — M810 Age-related osteoporosis without current pathological fracture: Secondary | ICD-10-CM | POA: Diagnosis not present

## 2014-12-31 ENCOUNTER — Other Ambulatory Visit: Payer: Self-pay | Admitting: Neurology

## 2014-12-31 ENCOUNTER — Other Ambulatory Visit: Payer: Self-pay | Admitting: Internal Medicine

## 2015-01-01 DIAGNOSIS — M810 Age-related osteoporosis without current pathological fracture: Secondary | ICD-10-CM | POA: Diagnosis not present

## 2015-01-01 DIAGNOSIS — R2681 Unsteadiness on feet: Secondary | ICD-10-CM | POA: Diagnosis not present

## 2015-01-02 ENCOUNTER — Telehealth: Payer: Self-pay | Admitting: Internal Medicine

## 2015-01-02 NOTE — Telephone Encounter (Signed)
Pt is calling stating she is still waiting on a Rx for Suvorexant (BELSOMRA) 20 MG TABS and rivastigmine (EXELON) 1.5 MG capsule.  She is leaving to go out of town in the morning and needs her Rx's called in to [facility].  Please call and advise.

## 2015-01-02 NOTE — Telephone Encounter (Signed)
New message     A 30 day presc for prodaxa was sent in to the pharmacy.  It comes in [place].  Can they fill the presc for 60 days?

## 2015-01-03 ENCOUNTER — Other Ambulatory Visit: Payer: Self-pay

## 2015-01-03 MED ORDER — DABIGATRAN ETEXILATE MESYLATE 75 MG PO CAPS: ORAL_CAPSULE | ORAL | Status: DC

## 2015-01-03 MED ORDER — METOPROLOL TARTRATE 25 MG PO TABS: 12.5000 mg | ORAL_TABLET | Freq: Two times a day (BID) | ORAL | Status: DC

## 2015-01-08 DIAGNOSIS — R2681 Unsteadiness on feet: Secondary | ICD-10-CM | POA: Diagnosis not present

## 2015-01-14 ENCOUNTER — Ambulatory Visit: Payer: PRIVATE HEALTH INSURANCE | Admitting: Physician Assistant

## 2015-01-15 DIAGNOSIS — R2681 Unsteadiness on feet: Secondary | ICD-10-CM | POA: Diagnosis not present

## 2015-01-16 ENCOUNTER — Telehealth: Payer: Self-pay | Admitting: Neurology

## 2015-01-16 NOTE — Telephone Encounter (Signed)
Pt is calling and has a question regarding BELSOMRA 20 MG TABS. She wants to know if it is addictive?  Should she try and do without it or try something over the counter.  Please call and advise. Please call and advise.

## 2015-01-16 NOTE — Telephone Encounter (Signed)
I called patient. The patient had a sleepless night on Belsomra last night. In general, this medication is less addictive than other sleeping pills. She does not have to take the sleeping pill every night, if she wishes, she may alternate with over-the-counter sleeping medications. She believes that this medication has been very effective for her.

## 2015-01-21 DIAGNOSIS — R2681 Unsteadiness on feet: Secondary | ICD-10-CM | POA: Diagnosis not present

## 2015-01-23 ENCOUNTER — Ambulatory Visit (INDEPENDENT_AMBULATORY_CARE_PROVIDER_SITE_OTHER): Payer: Medicare Other | Admitting: Physician Assistant

## 2015-01-23 ENCOUNTER — Encounter: Payer: Self-pay | Admitting: Physician Assistant

## 2015-01-23 VITALS — BP 130/60 | HR 59 | Ht 59.0 in | Wt 119.8 lb

## 2015-01-23 DIAGNOSIS — I493 Ventricular premature depolarization: Secondary | ICD-10-CM

## 2015-01-23 DIAGNOSIS — I10 Essential (primary) hypertension: Secondary | ICD-10-CM | POA: Diagnosis not present

## 2015-01-23 DIAGNOSIS — I5032 Chronic diastolic (congestive) heart failure: Secondary | ICD-10-CM

## 2015-01-23 DIAGNOSIS — R001 Bradycardia, unspecified: Secondary | ICD-10-CM

## 2015-01-23 DIAGNOSIS — I480 Paroxysmal atrial fibrillation: Secondary | ICD-10-CM | POA: Diagnosis not present

## 2015-01-23 DIAGNOSIS — I639 Cerebral infarction, unspecified: Secondary | ICD-10-CM | POA: Insufficient documentation

## 2015-01-23 DIAGNOSIS — R072 Precordial pain: Secondary | ICD-10-CM | POA: Diagnosis not present

## 2015-01-23 DIAGNOSIS — I5042 Chronic combined systolic (congestive) and diastolic (congestive) heart failure: Secondary | ICD-10-CM | POA: Insufficient documentation

## 2015-01-23 DIAGNOSIS — R0609 Other forms of dyspnea: Secondary | ICD-10-CM | POA: Diagnosis not present

## 2015-01-23 LAB — MAGNESIUM: MAGNESIUM: 2 mg/dL (ref 1.5–2.5)

## 2015-01-23 LAB — CBC WITH DIFFERENTIAL/PLATELET
BASOS PCT: 0.5 % (ref 0.0–3.0)
Basophils Absolute: 0 10*3/uL (ref 0.0–0.1)
EOS PCT: 3.3 % (ref 0.0–5.0)
Eosinophils Absolute: 0.2 10*3/uL (ref 0.0–0.7)
HCT: 40.1 % (ref 36.0–46.0)
Hemoglobin: 13.6 g/dL (ref 12.0–15.0)
LYMPHS PCT: 23.6 % (ref 12.0–46.0)
Lymphs Abs: 1.5 10*3/uL (ref 0.7–4.0)
MCHC: 33.8 g/dL (ref 30.0–36.0)
MCV: 94.7 fl (ref 78.0–100.0)
Monocytes Absolute: 0.6 10*3/uL (ref 0.1–1.0)
Monocytes Relative: 10 % (ref 3.0–12.0)
Neutro Abs: 4 10*3/uL (ref 1.4–7.7)
Neutrophils Relative %: 62.6 % (ref 43.0–77.0)
Platelets: 143 10*3/uL — ABNORMAL LOW (ref 150.0–400.0)
RBC: 4.24 Mil/uL (ref 3.87–5.11)
RDW: 14.1 % (ref 11.5–15.5)
WBC: 6.4 10*3/uL (ref 4.0–10.5)

## 2015-01-23 LAB — BASIC METABOLIC PANEL
BUN: 27 mg/dL — ABNORMAL HIGH (ref 6–23)
CHLORIDE: 101 meq/L (ref 96–112)
CO2: 31 meq/L (ref 19–32)
Calcium: 9.7 mg/dL (ref 8.4–10.5)
Creatinine, Ser: 0.87 mg/dL (ref 0.40–1.20)
GFR: 65.85 mL/min (ref >60.00)
GLUCOSE: 95 mg/dL (ref 70–99)
POTASSIUM: 5.4 meq/L — AB (ref 3.5–5.1)
SODIUM: 136 meq/L (ref 135–145)

## 2015-01-23 LAB — BRAIN NATRIURETIC PEPTIDE: Pro B Natriuretic peptide (BNP): 337 pg/mL — ABNORMAL HIGH (ref 0.0–100.0)

## 2015-01-23 LAB — T4, FREE: FREE T4: 1 ng/dL (ref 0.60–1.60)

## 2015-01-23 LAB — TSH: TSH: 1.56 u[IU]/mL (ref 0.35–4.50)

## 2015-01-23 MED ORDER — METOPROLOL SUCCINATE ER 25 MG PO TB24
25.0000 mg | ORAL_TABLET | Freq: Every day | ORAL | Status: DC
Start: 2015-01-23 — End: 2016-02-04

## 2015-01-23 NOTE — Progress Notes (Signed)
Cardiology Office Note Date:  01/23/2015  Patient ID:  Evalise, Delly 1927/12/16, MRN [id number] PCP:  Mathews Argyle, MD  Cardiologist:  Dr. Cristopher Peru  Chief Complaint: chest pain, dyspnea  History of Present Illness: SHAIANN HEINISCH is a 81 y.o. female with history of PAF, dyslipidemia, HTN, chronic diastolic CHF, arthritis, sinus bradycardia, h/o TIA vs stroke who presents for evaluation of chest pain and dyspnea. Dementia is listed on her chart, but she seems incredibly sharp at today's office visit. She sees Dr. Jannifer Franklin who has instead called this stable mild memory deficit. Dr. Lovena Le has maintained her on a dose of Pradaxa 75mg  BID. It's unclear why reduced dose but phone note from 2013 indicates "It is Pradaxa 75mg  twice daily that he suggested after talking with the patient." Last labs in 2012 showed Cr 0.9 (CrCl 37). She said she had labs at PCP earlier this year but I do not currently have access to them. Last seen by Dr. Lovena Le in 06/2013. Echo 08/2011: mild LVH, EF 50-55%, grade 1 DD, trivial AI.  She says she recently saw Dr. Felipa Eth for regular follow-up at which time her heart beat was noted to be skipping. She also reported infrequent episodes of random chest tightness occurring 2-3x a month, lasting about 30 minutes at a time. It has only occurred at rest. It is not brought on by anything in particular and does not worsen with any aggravating factors. It resolves spontaneously. No associated palpitations. It does not happen when she exerts herself, but she does endorse exertional dyspnea that has been increasing over the last several years (occasional "huffing and puffing" when she walks). She has been working with PT twice a week. She denies any recent falls or bleeding. No nausea, vomiting, presyncope, syncope. She has chronic low grade LEE and venous stasis changes - she reports LE duplex 2 years ago for this which were negative for DVT. No recent surgery, travel, bedrest. She  denies known AF recently. She has difficulty cutting her metoprolol tablets in half due to her severe arthritis.  Past Medical History  Diagnosis Date  . Hypercholesteremia   . Dementia in Alzheimer's disease with early onset   . Osteoporosis   . PAF (paroxysmal atrial fibrillation)     a. Intolerant to amiodarone, discontinued October 2012.  . Arthritis     severe  . Stroke 2007    mini stroke or TIA with aphasia   . Essential hypertension   . Chronic diastolic CHF (congestive heart failure)   . Memory deficit 05/24/2013  . Degenerative arthritis   . Sinus bradycardia     Past Surgical History  Procedure Laterality Date  . Replacement total knee bilateral Right 1999  . Right shoulder replacement  2009  . Right intertrochanteric hip fracture status post orif  August 2012  . Femur fracture surgery  8/12  . Cataract extraction  2009    ou  . Left knee  2001    Current Outpatient Prescriptions  Medication Sig Dispense Refill  . BELSOMRA 20 MG TABS TAKE 1 TABLET BY MOUTH AT BEDTIME AS NEEDED 30 tablet 1  . dabigatran (PRADAXA) 75 MG CAPS capsule TAKE 1 CAPSULE BY MOUTH EVERY 12 HOURS 60 capsule 0  . furosemide (LASIX) 20 MG tablet TAKE 1 TABLET BY MOUTH ON MONDAY, WEDNESDAY, AND FRIDAYS, THEN TAKE 1/2 TABLET ON ALL OTHER DAYS 30 tablet 0  . metoprolol tartrate (LOPRESSOR) 25 MG tablet Take 0.5 tablets (12.5 mg total) by  mouth 2 (two) times daily. 30 tablet 0  . rivastigmine (EXELON) 1.5 MG capsule TAKE 1 CAPSULE BY MOUTH TWICE DAILY 60 capsule 3  . traMADol (ULTRAM) 50 MG tablet Take 50 mg by mouth 2 (two) times daily.     . VOLTAREN 1 % GEL Apply 1 application topically as needed. For leg pain  3   No current facility-administered medications for this visit.    Allergies:   Codeine   Social History:  The patient  reports that she has never smoked. She has never used smokeless tobacco. She reports that she drinks about 0.6 oz of alcohol per week. She reports that she does not  use illicit drugs.   Family History:  The patient's family history includes Cancer - Colon in her father; Coronary artery disease in an other family member; Dementia in her mother; Stroke in her mother.  ROS:  Please see the history of present illness.    All other systems are reviewed and otherwise negative.   PHYSICAL EXAM:  VS:  BP 130/60 mmHg  Pulse 59  Ht 4\' 11"  (1.499 m)  Wt 119 lb 12.8 oz (54.341 kg)  BMI 24.18 kg/m2 BMI: Body mass index is 24.18 kg/(m^2). Well nourished thin WF in no acute distress. Steady gait in clinic. HEENT: normocephalic, atraumatic Neck: no JVD Cardiac:  normal S1, S2; RRR with occasional ectopy; no murmur Lungs:  clear to auscultation bilaterally, no wheezing, rhonchi or rales Abd: soft, nontender, no hepatomegaly Ext: trace sockline edema bilaterally with venous stasis changes Skin: warm and dry Neuro:  moves all extremities spontaneously, no focal abnormalities noted - seems very sharp with details and dates of her medical history including medical terminology  EKG:  Sinus bradycardia 59bpm occasional PVCs, no acute ST-T changes  Recent Labs: No results found for requested labs within last 365 days.  No results found for requested labs within last 365 days.   CrCl cannot be calculated (Patient has no serum creatinine result on file.).   Wt Readings from Last 3 Encounters:  01/23/15 119 lb 12.8 oz (54.341 kg)  10/03/14 113 lb 12.8 oz (51.619 kg)  06/30/13 110 lb 9.6 oz (50.168 kg)     Other studies reviewed: Additional studies/records reviewed today include: summarized above  ASSESSMENT AND PLAN:  1. Chest pain/dyspnea on exertion - chest pain is somewhat atypical, but dyspnea on exertion is more regularly reproducible. No known history of CAD or ischemic evaluation. She is not very active. Will check labs today including lytes, CBC, and BNP. I discussed possibility of stress testing versus updated echocardiogram with the patient. At this  time she prefers to obtain labwork first, then we'll make decision based on that. If her labs indicate worsened heart failure, I will plan to increase her Lasix as renal function allows and check an updated echo. If labs are unrevealing, will proceed with stress testing. Either way I think she would benefit from updated assessment of LV function regardless of either method.  2. Paroxysmal atrial fibrillation - she is in NSR with PVCs today and is asymptomatic. The patient has difficulty cutting her metoprolol in half. Will change her Lopressor 12.5mg  BID to Toprol XL 25mg  daily. I cannot rule out PAF as the cause of her chest discomfort but cannot titrate rate control further at this time due to sinus bradycardia. She denies any palpitations during these episodes. She has been maintained on Pradaxa 75mg  BID. I am not sure if she is on the reduced dose  for a particular reason. Will recheck kidney function today. If her CrCl qualifies her for a higher dose, I will consider instead switching her to Eliquis 2.5mg  BID (age >38, weight <60kg).  3. PVCs - this likely accounts for her recent "skipped beats." Check BMET, Mg, thyroid function today. EF assessment as above. 4. Essential HTN - controlled. 5. Chronic diastolic CHF - see above. 6. Sinus bradycardia - chronic.  Disposition: F/u with Dr. Lovena Le or APP in 6 weeks.  Current medicines are reviewed at length with the patient today.  The patient did not have any concerns regarding medicines.  Raechel Ache PA-C 01/23/2015 11:36 AM     [facility] [place] [address] [phone] (office)  [phone] (fax)

## 2015-01-23 NOTE — Patient Instructions (Addendum)
Your physician has recommended you make the following change in your medication:  1) STOP Metoprolol (Lopressor) 2) START Metoprolol XL (Succinate) 25mg  daily. An Rx has been sent to your pharmacy  Lab Today: Bmet, Mag, Cbc, Tsh, Bnp, Free T4  Your physician recommends that you schedule a follow-up appointment in: 6 weeks with Dr.Taylor/ or APP

## 2015-01-24 ENCOUNTER — Telehealth: Payer: Self-pay | Admitting: *Deleted

## 2015-01-24 DIAGNOSIS — I5032 Chronic diastolic (congestive) heart failure: Secondary | ICD-10-CM

## 2015-01-24 NOTE — Telephone Encounter (Signed)
lmptcb per Melina Copa, PA to stop banana's and limit K+ rich foods until repeat bmet 3/25. Lmptcb [phone] if any questions.

## 2015-01-24 NOTE — Telephone Encounter (Signed)
lmptcb on both home and cell # to go over results and to advise pt per Melina Copa, PA need to repeat bmet today; K+ 5.4.

## 2015-01-24 NOTE — Telephone Encounter (Signed)
Follow Up        Pt returning [name]'s call.

## 2015-01-24 NOTE — Telephone Encounter (Signed)
ptcb and has been notified of lab results. When I advised per Melina Copa, PA needs to repeat bmet today; K+ 5.4. Pt said she cannot get lab until tomorrow early afternoon. Pt relies on family for transportation. I said I will Dayna know lab 3/25.

## 2015-01-25 ENCOUNTER — Other Ambulatory Visit (INDEPENDENT_AMBULATORY_CARE_PROVIDER_SITE_OTHER): Payer: Medicare Other | Admitting: *Deleted

## 2015-01-25 DIAGNOSIS — I10 Essential (primary) hypertension: Secondary | ICD-10-CM

## 2015-01-25 LAB — BASIC METABOLIC PANEL
BUN: 23 mg/dL (ref 6–23)
CO2: 30 mEq/L (ref 19–32)
CREATININE: 0.82 mg/dL (ref 0.50–1.10)
Calcium: 9.8 mg/dL (ref 8.4–10.5)
Chloride: 98 mEq/L (ref 96–112)
Glucose, Bld: 101 mg/dL — ABNORMAL HIGH (ref 70–99)
Potassium: 4.9 mEq/L (ref 3.5–5.3)
Sodium: 137 mEq/L (ref 135–145)

## 2015-01-25 NOTE — Addendum Note (Signed)
Addended by: Eulis Foster on: 01/25/2015 11:48 AM   Modules accepted: Orders

## 2015-01-28 ENCOUNTER — Telehealth: Payer: Self-pay | Admitting: Physician Assistant

## 2015-01-28 DIAGNOSIS — I5032 Chronic diastolic (congestive) heart failure: Secondary | ICD-10-CM

## 2015-01-28 NOTE — Telephone Encounter (Signed)
lmtcb on home phone @1525  lmtcb on cell phone @1526 

## 2015-01-28 NOTE — Telephone Encounter (Signed)
I called pt to advise per Melina Copa, PA to have a cxr done. Pt states she just started a new medications from Dr. Jannifer Franklin which does have a side effect of a cough she states per information in phamplet. Pt states she would like to wait on cxr. She said she could get it done with Dr. Felipa Eth if cough continues. I advised pt to let us know if she would like for [place] to schedule just let us know. Pt said ok and thank you.

## 2015-01-28 NOTE — Telephone Encounter (Signed)
See result note on 3/23 lab for further dialogue of instructions. F/u BMET did not result to me but I have addressed this note in the encounter above.

## 2015-01-28 NOTE — Telephone Encounter (Signed)
lmtcb 3/28 @ 5:38 pm/pe

## 2015-01-28 NOTE — Telephone Encounter (Signed)
Patient called in for results. Patient provided results for lab work from 3/25. Patient states she has been having a non-productive frequent mild cough and wonders if it is related to her medications or more likely related to her cardiac status. Denies SOB, swelling, sinus issues or other current issues that would make her cough. Would like advisement if possible. Routed to [employer], [place].

## 2015-01-28 NOTE — Telephone Encounter (Signed)
New message ° ° ° ° °Want lab results °

## 2015-01-29 DIAGNOSIS — R2681 Unsteadiness on feet: Secondary | ICD-10-CM | POA: Diagnosis not present

## 2015-01-29 NOTE — Telephone Encounter (Signed)
Myself and Triage have been trying to reach pt to advise pt per Melina Copa, PA to ask pt if agreeable to [place] and if not would she be agreeable to an echo to assess LV function.

## 2015-01-29 NOTE — Telephone Encounter (Signed)
lmptcb to see if pt is agreeable to having a lexiscan, if not then an echo to assess LV function per Melina Copa, PA.

## 2015-01-29 NOTE — Telephone Encounter (Signed)
Myself and Triage have been trying to reach pt to advise pt per Melina Copa, PA to ask pt if agreeable to [place] and if not would she be agreeable to an echo to assess LV function.

## 2015-01-29 NOTE — Telephone Encounter (Signed)
Myself and Triage have been trying to reach pt to advise pt per Melina Copa, PA to ask pt if agreeable to [place] and if not would she be agreeable to an echo to assess LV function.

## 2015-01-30 NOTE — Telephone Encounter (Signed)
s/w pt today about having lexiscan per Melina Copa, PA. Pt agreebale to this however; she states she would like to wait until she see's Dr. Lovena Le 5/18. I advised I will ask Melina Copa, PA if this reasonable. Pt siad she will do sooner if really needs to.

## 2015-01-30 NOTE — Telephone Encounter (Signed)
That is fine by me. If she has any worsening symptoms in the meantime she should let us know. Thanks! Camauri Fleece PA-C

## 2015-01-31 ENCOUNTER — Other Ambulatory Visit: Payer: Self-pay | Admitting: Internal Medicine

## 2015-02-01 NOTE — Telephone Encounter (Signed)
ok per Melina Copa, PA to wait on Lexiscan until pt see's Dr. Lovena Le 5/18. Did advise if feeling worse in the meantime needs to call the office to let us know [phone].

## 2015-02-04 ENCOUNTER — Encounter: Payer: Self-pay | Admitting: Podiatry

## 2015-02-04 ENCOUNTER — Ambulatory Visit (INDEPENDENT_AMBULATORY_CARE_PROVIDER_SITE_OTHER): Payer: Medicare Other

## 2015-02-04 ENCOUNTER — Ambulatory Visit (INDEPENDENT_AMBULATORY_CARE_PROVIDER_SITE_OTHER): Payer: Medicare Other | Admitting: Podiatry

## 2015-02-04 VITALS — BP 150/78 | HR 62 | Resp 12 | Ht 59.0 in | Wt 118.0 lb

## 2015-02-04 DIAGNOSIS — M722 Plantar fascial fibromatosis: Secondary | ICD-10-CM

## 2015-02-04 DIAGNOSIS — G629 Polyneuropathy, unspecified: Secondary | ICD-10-CM

## 2015-02-04 DIAGNOSIS — M79673 Pain in unspecified foot: Secondary | ICD-10-CM

## 2015-02-04 MED ORDER — TRIAMCINOLONE ACETONIDE 10 MG/ML IJ SUSP
10.0000 mg | Freq: Once | INTRAMUSCULAR | Status: AC
  Administered 2015-02-04: 10 mg

## 2015-02-04 MED ORDER — GABAPENTIN 300 MG PO CAPS: 300.0000 mg | ORAL_CAPSULE | Freq: Every day | ORAL | Status: DC

## 2015-02-04 NOTE — Progress Notes (Signed)
   Subjective:    Patient ID: [name], female    DOB: [birth year], 81 y.o.   MRN: [id number]  HPI Comments: Pt complains of plantar foot and heel burning and tingling B/L for over 1.5 years.     Review of Systems  HENT: Positive for congestion and sinus pressure.   Eyes: Positive for itching.       Macular degeneration  Cardiovascular: Positive for leg swelling.       Calf pain at night  Genitourinary: Positive for urgency and frequency.  Musculoskeletal: Positive for myalgias, back pain, arthralgias and gait problem.  Hematological: Bruises/bleeds easily.  All other systems reviewed and are negative.      Objective:   Physical Exam        Assessment & Plan:

## 2015-02-05 DIAGNOSIS — R2681 Unsteadiness on feet: Secondary | ICD-10-CM | POA: Diagnosis not present

## 2015-02-06 ENCOUNTER — Ambulatory Visit: Payer: Medicare Other | Admitting: Podiatry

## 2015-02-06 NOTE — Progress Notes (Signed)
Subjective:     Patient ID: [name], female   DOB: 30-Oct-1928, 81 y.o.   MRN: [id number]  HPI patient presents stating I'm getting pain in the bottom of both my feet that's been going on for a year and a half but it seems to be getting worse. I also get some tingling that can occur in the evenings and while sleeping   Review of Systems  All other systems reviewed and are negative.      Objective:   Physical Exam  Constitutional: She is oriented to person, place, and time.  Cardiovascular: Intact distal pulses.   Musculoskeletal: Normal range of motion.  Neurological: She is oriented to person, place, and time.  Skin: Skin is warm.  Nursing note and vitals reviewed.  neurovascular status found to be intact with muscle strength adequate range of motion within normal limits. Patient presents with pain in the heels upon palpation and is noted to have moderate depression of the arch. I did note slight reduction of sharp dull and vibratory upon testing and I didn't not note any reduction of digital perfusion and patient is well oriented 3     Assessment:     Possibility for inflammatory fasciitis versus possibility for low grade neuropathic or neuropathy condition    Plan:     H&P performed condition discussed and at this point I did go ahead and I injected the plantar fascia of both feet 3 mg Kenalog 5 mg Xylocaine and placed this patient on gabapentin 300 mg to take at night area patient will be seen back to reevaluate

## 2015-02-25 ENCOUNTER — Telehealth: Payer: Self-pay | Admitting: Physician Assistant

## 2015-02-25 NOTE — Telephone Encounter (Signed)
Please call patient. I was able to discuss the patient with Dr. Lovena Le with regards to her blood thinner. She is on Pradaxa 75mg  BID. Technically her renal function allows her to be on 150mg  BID. Her creatinine clearance is 25ml/min. The recommendation for Pradaxa is to be on the lower dose if creatinine clearance is between 15-30.   However, if we were to switch her to Eliquis or Xarelto, she would still qualify for the lower doses of both. Dr. Lovena Le does not feel strongly either way about increasing her Pradaxa dose, leaving it the same, or switching her to low dose Eliquis.  I think if she wanted to make sure she is on the most appropriate dose of blood thinner, switching her to Eliquis 2.5mg  BID would be the best option if she decides to go that route. Alternatively she can discuss at followup appointment with Dr. Lovena Le. Can you get the patient's input on what she'd like to do?   Neah Sporrer PA-C

## 2015-03-04 ENCOUNTER — Ambulatory Visit (INDEPENDENT_AMBULATORY_CARE_PROVIDER_SITE_OTHER): Payer: Medicare Other | Admitting: Podiatry

## 2015-03-04 ENCOUNTER — Encounter: Payer: Self-pay | Admitting: Podiatry

## 2015-03-04 ENCOUNTER — Telehealth: Payer: Self-pay | Admitting: Internal Medicine

## 2015-03-04 VITALS — BP 171/83 | HR 56 | Resp 18

## 2015-03-04 DIAGNOSIS — M722 Plantar fascial fibromatosis: Secondary | ICD-10-CM | POA: Diagnosis not present

## 2015-03-04 DIAGNOSIS — G629 Polyneuropathy, unspecified: Secondary | ICD-10-CM

## 2015-03-04 NOTE — Telephone Encounter (Signed)
lmptcb at both her home and cell # to discuss options from increasing Pradaxa or switching to Xarelto or Eliquis.

## 2015-03-04 NOTE — Telephone Encounter (Signed)
ptcb not sure why need to increase Pradaxa or change to Xarelto or Eliquis. I explained due to her kidney function allows for dose change to give better anticoagulation therapy. Pt wants to wait to see Dr. Lovena Le 5/18 to discuss options further.

## 2015-03-04 NOTE — Telephone Encounter (Signed)
Patient returning call.  Transferred to [employer].

## 2015-03-04 NOTE — Telephone Encounter (Signed)
Expand All Collapse All   ptcb not sure why need to increase Pradaxa or change to Xarelto or Eliquis. I explained due to her kidney function allows for dose change to give better anticoagulation therapy. Pt wants to wait to see Dr. Lovena Le 5/18 to discuss options further.

## 2015-03-05 ENCOUNTER — Other Ambulatory Visit: Payer: Self-pay | Admitting: Neurology

## 2015-03-05 NOTE — Progress Notes (Signed)
Subjective:     Patient ID: [name], female   DOB: [birth year], 81 y.o.   MRN: [id number]  HPI patient states that her heels or not as painful but they still bother her at night when she sleeps and lays down and puts them on her bed. Also complains about pain when she gets up   Review of Systems     Objective:   Physical Exam Neurovascular status intact no other change in health history with significant diminishment of discomfort when I palpated the plantar heels bilateral    Assessment:     Plantar fasciitis bilateral which is shown some improvement but continues to be sore upon palpation    Plan:     Advised on physical therapy anti-inflammatories and dispensed least booties to try to keep pressure off the back of the heels. Also placed into a night splint with instructions on usage when sitting to try to help reduce the stress

## 2015-03-06 NOTE — Telephone Encounter (Signed)
Rx has been faxed.

## 2015-03-15 ENCOUNTER — Other Ambulatory Visit: Payer: Self-pay | Admitting: Internal Medicine

## 2015-03-20 ENCOUNTER — Encounter: Payer: Self-pay | Admitting: Internal Medicine

## 2015-03-20 ENCOUNTER — Other Ambulatory Visit: Payer: Self-pay | Admitting: *Deleted

## 2015-03-20 ENCOUNTER — Ambulatory Visit (INDEPENDENT_AMBULATORY_CARE_PROVIDER_SITE_OTHER): Payer: Medicare Other | Admitting: Internal Medicine

## 2015-03-20 VITALS — BP 142/72 | HR 57 | Ht 59.0 in | Wt 118.0 lb

## 2015-03-20 DIAGNOSIS — R0602 Shortness of breath: Secondary | ICD-10-CM | POA: Diagnosis not present

## 2015-03-20 DIAGNOSIS — I480 Paroxysmal atrial fibrillation: Secondary | ICD-10-CM

## 2015-03-20 DIAGNOSIS — I5032 Chronic diastolic (congestive) heart failure: Secondary | ICD-10-CM | POA: Diagnosis not present

## 2015-03-20 DIAGNOSIS — E785 Hyperlipidemia, unspecified: Secondary | ICD-10-CM

## 2015-03-20 DIAGNOSIS — R001 Bradycardia, unspecified: Secondary | ICD-10-CM | POA: Diagnosis not present

## 2015-03-20 DIAGNOSIS — I4891 Unspecified atrial fibrillation: Secondary | ICD-10-CM

## 2015-03-20 MED ORDER — FUROSEMIDE 20 MG PO TABS: 20.0000 mg | ORAL_TABLET | Freq: Every day | ORAL | Status: DC

## 2015-03-20 NOTE — Patient Instructions (Addendum)
Medication Instructions:  Your physician has recommended you make the following change in your medication: 1) Take Toprol XL 25 mg daily 2) Take Furosemide 20 mg daily        Labwork: None ordered  Testing/Procedures: Your physician has requested that you have an echocardiogram. Echocardiography is a painless test that uses sound waves to create images of your heart. It provides your doctor with information about the size and shape of your heart and how well your heart's chambers and valves are working. This procedure takes approximately one hour. There are no restrictions for this procedure.   Follow-Up: August 16 @ 10:30 with [name], NP   Any Other Special Instructions Will Be Listed Below (If Applicable).

## 2015-03-20 NOTE — Assessment & Plan Note (Signed)
She will continue her low-fat diet. I've encouraged the patient try to lose some weight.

## 2015-03-20 NOTE — Assessment & Plan Note (Signed)
She admits to some sodium indiscretion, but we'll try to do better. I've asked the patient takes Lasix 20 mg daily.

## 2015-03-20 NOTE — Progress Notes (Signed)
`HPI [name] returns today for followup. She very pleasant woman with a history of paroxysmal atrial fibrillation, dyslipidemia, hypertension, and diastolic heart failure. The patient has been diagnosed with rheumatoid arthritis. Her atrial arrhythmias appear to be well-controlled. She denies chest pain, shortness of breath, or syncope. She is bothered by arthritis. She has had trouble cutting her medications. Allergies  Allergen Reactions  . Codeine Nausea Only     Current Outpatient Prescriptions  Medication Sig Dispense Refill  . BELSOMRA 20 MG TABS TAKE 1 TABLET BY MOUTH EVERY NIGHT AT BEDTIME AS NEEDED 30 tablet 3  . furosemide (LASIX) 20 MG tablet Take 1 tablet (20 mg total) by mouth daily. 30 tablet 3  . gabapentin (NEURONTIN) 300 MG capsule Take 1 capsule (300 mg total) by mouth at bedtime. (Patient taking differently: Take 300 mg by mouth as needed (for arthritis pain). ) 30 capsule 1  . metoprolol succinate (TOPROL XL) 25 MG 24 hr tablet Take 1 tablet (25 mg total) by mouth daily. (Patient taking differently: Take 12.5 mg by mouth 2 (two) times daily. ) 30 tablet 11  . rivastigmine (EXELON) 1.5 MG capsule TAKE 1 CAPSULE BY MOUTH TWICE DAILY 60 capsule 3  . traMADol (ULTRAM) 50 MG tablet Take 50 mg by mouth 2 (two) times daily.     . VOLTAREN 1 % GEL Apply 1 application topically as needed. For leg pain  3  . PRADAXA 75 MG CAPS capsule TAKE 1 CAPSULE BY MOUTH EVERY 12 HOURS 60 capsule 6   No current facility-administered medications for this visit.     Past Medical History  Diagnosis Date  . Hypercholesteremia   . Osteoporosis   . PAF (paroxysmal atrial fibrillation)     a. Intolerant to amiodarone, discontinued October 2012.  . Arthritis     severe  . Stroke 2007    mini stroke or TIA with aphasia   . Essential hypertension   . Chronic diastolic CHF (congestive heart failure)   . Memory deficit 05/24/2013  . Degenerative arthritis   . Sinus bradycardia     ROS:   All systems reviewed and negative except as noted in the HPI.   Past Surgical History  Procedure Laterality Date  . Replacement total knee bilateral Right 1999  . Right shoulder replacement  2009  . Right intertrochanteric hip fracture status post orif  August 2012  . Femur fracture surgery  8/12  . Cataract extraction  2009    ou  . Left knee  2001     Family History  Problem Relation Age of Onset  . Coronary artery disease    . Dementia Mother   . Stroke Mother   . Cancer - Colon Father      History   Social History  . Marital Status: Widowed    Spouse Name: N/A  . Number of Children: 2  . Years of Education: MA   Occupational History  . RETIRED    Social History Main Topics  . Smoking status: Never Smoker   . Smokeless tobacco: Never Used  . Alcohol Use: 0.6 oz/week    1 Glasses of wine per week     Comment: 1 glass wine with dinner some days  . Drug Use: No  . Sexual Activity: Not on file   Other Topics Concern  . Not on file   Social History Narrative     BP 142/72 mmHg  Pulse 57  Ht 4\' 11"  (1.499 m)  Wt 118  lb (53.524 kg)  BMI 23.82 kg/m2  Physical Exam:  Well appearing elderly woman, NAD HEENT: Unremarkable Neck:  No JVD, no thyromegally Back:  No CVA tenderness Lungs:  Clear with no wheezes, rales, or rhonchi. HEART:  Regular rate rhythm, no murmurs, no rubs, no clicks Abd:  soft, positive bowel sounds, no organomegally, no rebound, no guarding Ext:  2 plus pulses, no edema, no cyanosis, no clubbing Skin:  No rashes, multiple nodules on the hands, on multiple joints. Neuro:  CN II through XII intact, motor grossly intact  ECG - normal sinus rhythm    Assess/Plan:

## 2015-03-20 NOTE — Assessment & Plan Note (Signed)
She has some sinus node dysfunction but this appears to be fairly well controlled. She is not symptomatic.

## 2015-03-20 NOTE — Assessment & Plan Note (Signed)
She is maintaining sinus rhythm recently well. Today we have asked the patient to continue her systemic anticoagulation, and increase her Toprol to 25 mg daily.

## 2015-03-26 ENCOUNTER — Other Ambulatory Visit: Payer: Self-pay

## 2015-03-26 ENCOUNTER — Ambulatory Visit (HOSPITAL_COMMUNITY): Payer: Medicare Other | Attending: Cardiovascular Disease

## 2015-03-26 DIAGNOSIS — R0602 Shortness of breath: Secondary | ICD-10-CM | POA: Diagnosis present

## 2015-03-26 DIAGNOSIS — I340 Nonrheumatic mitral (valve) insufficiency: Secondary | ICD-10-CM | POA: Insufficient documentation

## 2015-03-26 DIAGNOSIS — I059 Rheumatic mitral valve disease, unspecified: Secondary | ICD-10-CM | POA: Insufficient documentation

## 2015-03-26 DIAGNOSIS — I4891 Unspecified atrial fibrillation: Secondary | ICD-10-CM

## 2015-03-26 DIAGNOSIS — I351 Nonrheumatic aortic (valve) insufficiency: Secondary | ICD-10-CM | POA: Diagnosis not present

## 2015-04-08 ENCOUNTER — Encounter: Payer: Self-pay | Admitting: Neurology

## 2015-04-08 ENCOUNTER — Telehealth: Payer: Self-pay | Admitting: *Deleted

## 2015-04-08 ENCOUNTER — Ambulatory Visit (INDEPENDENT_AMBULATORY_CARE_PROVIDER_SITE_OTHER): Payer: Medicare Other | Admitting: Neurology

## 2015-04-08 ENCOUNTER — Telehealth: Payer: Self-pay | Admitting: Internal Medicine

## 2015-04-08 VITALS — BP 161/74 | HR 68 | Ht 59.0 in | Wt 120.6 lb

## 2015-04-08 DIAGNOSIS — R413 Other amnesia: Secondary | ICD-10-CM

## 2015-04-08 MED ORDER — RIVASTIGMINE TARTRATE 3 MG PO CAPS: 3.0000 mg | ORAL_CAPSULE | Freq: Two times a day (BID) | ORAL | Status: DC

## 2015-04-08 NOTE — Telephone Encounter (Signed)
error 

## 2015-04-08 NOTE — Progress Notes (Signed)
Reason for visit: Memory disorder  [name] is an 81 y.o. female  History of present illness:  [name] is an 81 year old right-handed white female with a history of a progressive memory disorder. The patient has been on Aricept, but she could not tolerate the medication secondary to diarrhea. The patient was switched over to Exelon, currently only on the 1.5 mg capsules twice daily. She is tolerating the drug physically, but not financially. The patient has hit the donut hole, and she is not sure she can tolerate the cost of the drug. In the last several months she has undergone physical therapy for gait training which has been beneficial. She has begun having occasional episodes of sharp shooting pains lasting only a few seconds in her right temporal region. The patient returns for further evaluation. The patient reports ongoing issues with mild memory problems, mainly with word finding issues.  Past Medical History  Diagnosis Date  . Hypercholesteremia   . Osteoporosis   . PAF (paroxysmal atrial fibrillation)     a. Intolerant to amiodarone, discontinued October 2012.  . Arthritis     severe  . Stroke 2007    mini stroke or TIA with aphasia   . Essential hypertension   . Chronic diastolic CHF (congestive heart failure)   . Memory deficit 05/24/2013  . Degenerative arthritis   . Sinus bradycardia     Past Surgical History  Procedure Laterality Date  . Replacement total knee bilateral Right 1999  . Right shoulder replacement  2009  . Right intertrochanteric hip fracture status post orif  August 2012  . Femur fracture surgery  8/12  . Cataract extraction  2009    ou  . Left knee  2001    Family History  Problem Relation Age of Onset  . Coronary artery disease    . Dementia Mother   . Stroke Mother   . Cancer - Colon Father     Social history:  reports that she has never smoked. She has never used smokeless tobacco. She reports that she drinks about 4.2 oz of alcohol  per week. She reports that she does not use illicit drugs.    Allergies  Allergen Reactions  . Codeine Nausea Only    Medications:  Prior to Admission medications   Medication Sig Start Date End Date Taking? Authorizing Provider  BELSOMRA 20 MG TABS TAKE 1 TABLET BY MOUTH EVERY NIGHT AT BEDTIME AS NEEDED 03/05/15  Yes Kathrynn Ducking, MD  furosemide (LASIX) 20 MG tablet Take 1 tablet (20 mg total) by mouth daily. 03/20/15  Yes Evans Lance, MD  gabapentin (NEURONTIN) 300 MG capsule Take 1 capsule (300 mg total) by mouth at bedtime. Patient taking differently: Take 300 mg by mouth as needed (for arthritis pain).  02/04/15  Yes Wallene Huh, DPM  metoprolol succinate (TOPROL XL) 25 MG 24 hr tablet Take 1 tablet (25 mg total) by mouth daily. Patient taking differently: Take 12.5 mg by mouth 2 (two) times daily.  01/23/15  Yes Dayna N Dunn, PA-C  PRADAXA 75 MG CAPS capsule TAKE 1 CAPSULE BY MOUTH EVERY 12 HOURS 01/31/15  Yes Evans Lance, MD  rivastigmine (EXELON) 1.5 MG capsule TAKE 1 CAPSULE BY MOUTH TWICE DAILY 01/02/15  Yes Kathrynn Ducking, MD  traMADol (ULTRAM) 50 MG tablet Take 50 mg by mouth 2 (two) times daily.  12/01/11  Yes Historical Provider, MD  VOLTAREN 1 % GEL Apply 1 application topically as  needed. For leg pain 11/09/14  Yes Historical Provider, MD    ROS:  Out of a complete 14 system review of symptoms, the patient complains only of the following symptoms, and all other reviewed systems are negative.  Runny nose, difficulty swallowing Light sensitivity, loss of vision Cough, shortness of breath, choking Flushing Speech difficulty Bruising easily  Blood pressure 161/74, pulse 68, height 4\' 11"  (1.499 m), weight 120 lb 9.6 oz (54.704 kg).  Physical Exam  General: The patient is alert and cooperative at the time of the examination.  Skin: No significant peripheral edema is noted.   Neurologic Exam  Mental status: The patient is alert and oriented x 3 at the time of  the examination. The patient has apparent normal recent and remote memory, with an apparently normal attention span and concentration ability. Mini-Mental Status Examination done today shows a total score of 30/30. The patient is able to name 12 animals in 30 seconds.   Cranial nerves: Facial symmetry is present. Speech is normal, no aphasia or dysarthria is noted. Extraocular movements are full. Visual fields are full.  Motor: The patient has good strength in all 4 extremities.  Sensory examination: Soft touch sensation is symmetric on the face and arms, slightly decreased on the right leg as compared to the left.  Coordination: The patient has good finger-nose-finger and heel-to-shin bilaterally.  Gait and station: The patient has a normal gait. Tandem gait is unsteady. Romberg is negative, but is unsteady. No drift is seen.  Reflexes: Deep tendon reflexes are symmetric.   Assessment/Plan:  1. Mild memory disturbance  2. Chronic insomnia  The patient is doing relatively well with the memory. The patient is not able to afford Exelon capsules well, she is willing to try to go up on the 3 mg twice daily dosing. I will call in a prescription. She will follow-up in 6 months. The patient is doing well with the [place] with the sleep issue.  Jill Alexanders MD 04/08/2015 7:17 PM  [facility] [address] [place], [place] [address]  Phone [phone] Fax [phone]

## 2015-04-08 NOTE — Telephone Encounter (Signed)
Pradaxa samples placed at the front desk for patient.

## 2015-04-16 ENCOUNTER — Encounter: Payer: Self-pay | Admitting: *Deleted

## 2015-05-15 ENCOUNTER — Other Ambulatory Visit: Payer: Self-pay | Admitting: Internal Medicine

## 2015-06-06 DIAGNOSIS — Z09 Encounter for follow-up examination after completed treatment for conditions other than malignant neoplasm: Secondary | ICD-10-CM | POA: Diagnosis not present

## 2015-06-06 DIAGNOSIS — Z96652 Presence of left artificial knee joint: Secondary | ICD-10-CM | POA: Diagnosis not present

## 2015-06-06 DIAGNOSIS — Z96651 Presence of right artificial knee joint: Secondary | ICD-10-CM | POA: Diagnosis not present

## 2015-06-06 DIAGNOSIS — M25562 Pain in left knee: Secondary | ICD-10-CM | POA: Diagnosis not present

## 2015-06-12 DIAGNOSIS — M79641 Pain in right hand: Secondary | ICD-10-CM | POA: Diagnosis not present

## 2015-06-12 DIAGNOSIS — M25562 Pain in left knee: Secondary | ICD-10-CM | POA: Diagnosis not present

## 2015-06-18 ENCOUNTER — Ambulatory Visit: Payer: Medicare Other | Admitting: Nurse Practitioner

## 2015-06-25 ENCOUNTER — Ambulatory Visit: Payer: Medicare Other | Admitting: Nurse Practitioner

## 2015-07-01 ENCOUNTER — Other Ambulatory Visit: Payer: Self-pay | Admitting: Internal Medicine

## 2015-07-02 ENCOUNTER — Other Ambulatory Visit: Payer: Self-pay | Admitting: Internal Medicine

## 2015-07-02 ENCOUNTER — Telehealth: Payer: Self-pay | Admitting: Internal Medicine

## 2015-07-02 MED ORDER — VERAPAMIL HCL ER 120 MG PO CP24: 120.0000 mg | ORAL_CAPSULE | Freq: Every day | ORAL | Status: DC

## 2015-07-02 NOTE — Telephone Encounter (Signed)
Spoke with patient, she says she has some swelling in both legs but what concerns her is a knot on her left leg(inside below knee).  She has more swelling in her foot and ankle on that side.  She called Dr Carlyle Lipa office yesterday and today.  She is taking Furosemide 20 mg daily.  The lump came up over the weekend.  The swelling is the same on leg even on Furosemide daily.  She is also taking Metoprolol 25 mg daily and is noticing she is having hair loss.    I have discussed with Dr Lovena Le and will changer her to Verapamil 180 mg daily.  She is aware and the new rx was called in.  She will follow up with Dr Felipa Eth in regards to lump in leg

## 2015-07-02 NOTE — Telephone Encounter (Signed)
Pt c/o swelling: STAT is pt has developed SOB within 24 hours  1. How long have you been experiencing swelling? In leg- over a week- pt c/o of a new lump appearing over the weekend  2. Where is the swelling located? L leg  3.  Are you currently taking a "fluid pill"? Yes, lasix  4.  Are you currently SOB? Yes after walking- ongoing w/ CHF  5.  Have you traveled recently?n/a

## 2015-07-03 ENCOUNTER — Emergency Department (HOSPITAL_BASED_OUTPATIENT_CLINIC_OR_DEPARTMENT_OTHER): Payer: Medicare Other

## 2015-07-03 ENCOUNTER — Encounter (HOSPITAL_COMMUNITY): Payer: Self-pay | Admitting: *Deleted

## 2015-07-03 ENCOUNTER — Emergency Department (HOSPITAL_COMMUNITY): Payer: Medicare Other

## 2015-07-03 ENCOUNTER — Emergency Department (HOSPITAL_COMMUNITY)
Admission: EM | Admit: 2015-07-03 | Discharge: 2015-07-03 | Disposition: A | Discharge status: Home / Self Care | Payer: Medicare Other | Attending: Emergency Medicine | Admitting: Emergency Medicine

## 2015-07-03 DIAGNOSIS — M79605 Pain in left leg: Secondary | ICD-10-CM

## 2015-07-03 DIAGNOSIS — R0602 Shortness of breath: Secondary | ICD-10-CM | POA: Insufficient documentation

## 2015-07-03 DIAGNOSIS — Z8639 Personal history of other endocrine, nutritional and metabolic disease: Secondary | ICD-10-CM | POA: Diagnosis not present

## 2015-07-03 DIAGNOSIS — R1032 Left lower quadrant pain: Secondary | ICD-10-CM | POA: Diagnosis not present

## 2015-07-03 DIAGNOSIS — R109 Unspecified abdominal pain: Secondary | ICD-10-CM | POA: Insufficient documentation

## 2015-07-03 DIAGNOSIS — I10 Essential (primary) hypertension: Secondary | ICD-10-CM | POA: Diagnosis not present

## 2015-07-03 DIAGNOSIS — R2240 Localized swelling, mass and lump, unspecified lower limb: Secondary | ICD-10-CM | POA: Diagnosis not present

## 2015-07-03 DIAGNOSIS — I480 Paroxysmal atrial fibrillation: Secondary | ICD-10-CM | POA: Diagnosis not present

## 2015-07-03 DIAGNOSIS — M25552 Pain in left hip: Secondary | ICD-10-CM | POA: Diagnosis not present

## 2015-07-03 DIAGNOSIS — R21 Rash and other nonspecific skin eruption: Secondary | ICD-10-CM | POA: Insufficient documentation

## 2015-07-03 DIAGNOSIS — M7989 Other specified soft tissue disorders: Secondary | ICD-10-CM | POA: Diagnosis not present

## 2015-07-03 DIAGNOSIS — K5730 Diverticulosis of large intestine without perforation or abscess without bleeding: Secondary | ICD-10-CM | POA: Diagnosis not present

## 2015-07-03 DIAGNOSIS — Z79899 Other long term (current) drug therapy: Secondary | ICD-10-CM | POA: Insufficient documentation

## 2015-07-03 DIAGNOSIS — Z8673 Personal history of transient ischemic attack (TIA), and cerebral infarction without residual deficits: Secondary | ICD-10-CM | POA: Insufficient documentation

## 2015-07-03 DIAGNOSIS — I5032 Chronic diastolic (congestive) heart failure: Secondary | ICD-10-CM | POA: Diagnosis not present

## 2015-07-03 LAB — CBC WITH DIFFERENTIAL/PLATELET
BASOS ABS: 0 10*3/uL (ref 0.0–0.1)
BASOS PCT: 0 % (ref 0–1)
Eosinophils Absolute: 0 10*3/uL (ref 0.0–0.7)
Eosinophils Relative: 0 % (ref 0–5)
HEMATOCRIT: 37.8 % (ref 36.0–46.0)
Hemoglobin: 12.9 g/dL (ref 12.0–15.0)
LYMPHS PCT: 6 % — AB (ref 12–46)
Lymphs Abs: 0.4 10*3/uL — ABNORMAL LOW (ref 0.7–4.0)
MCH: 33 pg (ref 26.0–34.0)
MCHC: 34.1 g/dL (ref 30.0–36.0)
MCV: 96.7 fL (ref 78.0–100.0)
MONO ABS: 0.5 10*3/uL (ref 0.1–1.0)
Monocytes Relative: 6 % (ref 3–12)
NEUTROS ABS: 6.8 10*3/uL (ref 1.7–7.7)
Neutrophils Relative %: 88 % — ABNORMAL HIGH (ref 43–77)
PLATELETS: 122 10*3/uL — AB (ref 150–400)
RBC: 3.91 MIL/uL (ref 3.87–5.11)
RDW: 12.7 % (ref 11.5–15.5)
WBC: 7.7 10*3/uL (ref 4.0–10.5)

## 2015-07-03 LAB — I-STAT CHEM 8, ED
BUN: 25 mg/dL — ABNORMAL HIGH (ref 6–20)
CHLORIDE: 97 mmol/L — AB (ref 101–111)
CREATININE: 0.8 mg/dL (ref 0.44–1.00)
Calcium, Ion: 1.17 mmol/L (ref 1.13–1.30)
Glucose, Bld: 119 mg/dL — ABNORMAL HIGH (ref 65–99)
HCT: 42 % (ref 36.0–46.0)
HEMOGLOBIN: 14.3 g/dL (ref 12.0–15.0)
POTASSIUM: 4.9 mmol/L (ref 3.5–5.1)
Sodium: 132 mmol/L — ABNORMAL LOW (ref 135–145)
TCO2: 23 mmol/L (ref 0–100)

## 2015-07-03 LAB — BRAIN NATRIURETIC PEPTIDE: B NATRIURETIC PEPTIDE 5: 413.6 pg/mL — AB (ref 0.0–100.0)

## 2015-07-03 MED ORDER — HYDROMORPHONE HCL 2 MG PO TABS: 1.0000 mg | ORAL_TABLET | Freq: Four times a day (QID) | ORAL | Status: DC | PRN

## 2015-07-03 MED ORDER — HYDROMORPHONE HCL 2 MG PO TABS
1.0000 mg | ORAL_TABLET | Freq: Four times a day (QID) | ORAL | Status: DC | PRN
Start: 2015-07-03 — End: 2015-07-03

## 2015-07-03 MED ORDER — IOHEXOL 300 MG/ML  SOLN: 75.0000 mL | Freq: Once | INTRAMUSCULAR | Status: DC | PRN

## 2015-07-03 NOTE — ED Notes (Signed)
Pt presents via POV c/o left hip/groin pain x 2 days and left leg swelling x months.  Pt reports going to MD later today for swelling but her left hip started bothering her.  Pt reports pain worse when trying to stand up, better with ambulation.  Pt a x 4, NAD.

## 2015-07-03 NOTE — Discharge Instructions (Signed)

## 2015-07-03 NOTE — ED Notes (Signed)
MD at bedside. 

## 2015-07-03 NOTE — ED Provider Notes (Signed)
CSN: [id number]     Arrival date & time 07/03/15  0912 History   First MD Initiated Contact with Patient 07/03/15 [phone]     Chief Complaint  Patient presents with  . Groin Pain  . Leg Swelling     (Consider location/radiation/quality/duration/timing/severity/associated sxs/prior Treatment) Patient is a 81 y.o. female presenting with groin pain. The history is provided by the patient.  Groin Pain This is a new problem. Associated symptoms include shortness of breath. Pertinent negatives include no chest pain, no abdominal pain and no headaches.   patient has had pain in her left groin for the last 3 days. Worse with certain movements. Better with rest. States it feels somewhat like when she broke her pelvis before. No fall. She does have swelling in both her lower legs, but worse on the left side. No change in her chronic shortness of breath. No fevers. No swelling. Patient is on Pradaxa for atrial fibrillation  Past Medical History  Diagnosis Date  . Hypercholesteremia   . Osteoporosis   . PAF (paroxysmal atrial fibrillation)     a. Intolerant to amiodarone, discontinued October 2012.  . Arthritis     severe  . Stroke 2007    mini stroke or TIA with aphasia   . Essential hypertension   . Chronic diastolic CHF (congestive heart failure)   . Memory deficit 05/24/2013  . Degenerative arthritis   . Sinus bradycardia    Past Surgical History  Procedure Laterality Date  . Replacement total knee bilateral Right 1999  . Right shoulder replacement  2009  . Right intertrochanteric hip fracture status post orif  August 2012  . Femur fracture surgery  8/12  . Cataract extraction  2009    ou  . Left knee  2001   Family History  Problem Relation Age of Onset  . Coronary artery disease    . Dementia Mother   . Stroke Mother   . Cancer - Colon Father    Social History  Substance Use Topics  . Smoking status: Never Smoker   . Smokeless tobacco: Never Used  . Alcohol Use: 4.2 oz/week     7 Glasses of wine per week     Comment: 1 glass wine with dinner some days   OB History    No data available     Review of Systems  Constitutional: Negative for fever, activity change and appetite change.  Eyes: Negative for pain.  Respiratory: Positive for shortness of breath. Negative for chest tightness.   Cardiovascular: Positive for leg swelling. Negative for chest pain.  Gastrointestinal: Negative for nausea, vomiting, abdominal pain and diarrhea.  Genitourinary: Negative for flank pain.  Musculoskeletal: Positive for gait problem. Negative for back pain, joint swelling and neck stiffness.  Skin: Positive for rash. Negative for wound.  Neurological: Negative for weakness, numbness and headaches.  Psychiatric/Behavioral: Negative for behavioral problems.      Allergies  Codeine  Home Medications   Prior to Admission medications   Medication Sig Start Date End Date Taking? Authorizing Provider  BELSOMRA 20 MG TABS TAKE 1 TABLET BY MOUTH EVERY NIGHT AT BEDTIME AS NEEDED 03/05/15   Kathrynn Ducking, MD  furosemide (LASIX) 20 MG tablet Take 1 tablet (20 mg total) by mouth daily. 03/20/15   Evans Lance, MD  gabapentin (NEURONTIN) 300 MG capsule Take 1 capsule (300 mg total) by mouth at bedtime. Patient taking differently: Take 300 mg by mouth as needed (for arthritis pain).  02/04/15  Wallene Huh, DPM  HYDROmorphone (DILAUDID) 2 MG tablet Take 0.5 tablets (1 mg total) by mouth every 6 (six) hours as needed for severe pain. 07/03/15   Davonna Belling, MD  PRADAXA 75 MG CAPS capsule TAKE 1 CAPSULE BY MOUTH EVERY 12 HOURS 07/02/15   Evans Lance, MD  rivastigmine (EXELON) 3 MG capsule Take 1 capsule (3 mg total) by mouth 2 (two) times daily. 04/08/15   Kathrynn Ducking, MD  traMADol (ULTRAM) 50 MG tablet Take 50 mg by mouth 2 (two) times daily.  12/01/11   Historical Provider, MD  verapamil (VERELAN PM) 120 MG 24 hr capsule Take 1 capsule (120 mg total) by mouth at bedtime.  07/02/15   Evans Lance, MD  VOLTAREN 1 % GEL Apply 1 application topically as needed. For leg pain 11/09/14   Historical Provider, MD   BP 127/74 mmHg  Pulse 84  Temp(Src) 98 F (36.7 C) (Oral)  Resp 16  Ht 4\' 11"  (1.499 m)  Wt 120 lb (54.432 kg)  BMI 24.22 kg/m2  SpO2 95% Physical Exam  Constitutional: She appears well-developed.  HENT:  Head: Atraumatic.  Eyes: Pupils are equal, round, and reactive to light.  Neck: No JVD present.  Cardiovascular: Normal rate.   Pulmonary/Chest: Effort normal and breath sounds normal.  Abdominal: Soft. There is no tenderness.  Musculoskeletal: She exhibits edema and tenderness.  Patient with tenderness in left groin. Somewhat worse with passive movement of the leg and worse with active movement of the leg. No swelling. No specific tenderness over the bones. Pelvis is stable. Moderate edema to bilateral lower extremity's, worse on the left side. Pulse intact in left groin.  Skin: Skin is warm.  Some petechiae to bilateral lower legs.  Psychiatric: She has a normal mood and affect.    ED Course  Procedures (including critical care time) Labs Review Labs Reviewed  CBC WITH DIFFERENTIAL/PLATELET - Abnormal; Notable for the following:    Platelets 122 (*)    Neutrophils Relative % 88 (*)    Lymphocytes Relative 6 (*)    Lymphs Abs 0.4 (*)    All other components within normal limits  BRAIN NATRIURETIC PEPTIDE - Abnormal; Notable for the following:    B Natriuretic Peptide 413.6 (*)    All other components within normal limits  I-STAT CHEM 8, ED - Abnormal; Notable for the following:    Sodium 132 (*)    Chloride 97 (*)    BUN 25 (*)    Glucose, Bld 119 (*)    All other components within normal limits    Imaging Review Ct Abdomen Pelvis Wo Contrast  07/03/2015   CLINICAL DATA:  Left groin and hip pain for 2 days. On anticoagulation. Evaluate for psoas or retroperitoneal hemorrhage.  EXAM: CT ABDOMEN AND PELVIS WITHOUT CONTRAST   TECHNIQUE: Multidetector CT imaging of the abdomen and pelvis was performed following the standard protocol without IV contrast.  COMPARISON:  None.  FINDINGS: Lower chest: No acute findings.  Hepatobiliary: No mass visualized on this unenhanced exam. Gallbladder is unremarkable.  Pancreas: No mass or inflammatory process visualized on this unenhanced exam.  Spleen:  Within normal limits in size.  Adrenal Glands:  No masses identified.  Kidneys/Urinary tract: No evidence of urolithiasis or hydronephrosis. No definite mass visualized on this unenhanced exam.  Stomach/Bowel/Peritoneum: No evidence of obstruction, inflammatory process, or abnormal fluid collections. Colonic diverticulosis is noted, however there is no evidence of acute diverticulitis.  Vascular/Lymphatic: No pathologically  enlarged lymph nodes identified. No evidence of abdominal aortic aneurysm. No evidence psoas or retroperitoneal hemorrhage.  Reproductive:  No mass or other significant abnormality noted.  Other:  None.  Musculoskeletal: No suspicious bone lesions identified. Old fracture deformities of left pubic rami and right hip are seen with internal fixation are are in the right hip. Advanced lumbar spondylosis and levoscoliosis also noted.  IMPRESSION: No evidence of psoas or retroperitoneal hematoma. No other acute findings identified.  Colonic diverticulosis. No radiographic evidence of diverticulitis.   Electronically Signed   By: Earle Gell M.D.   On: 07/03/2015 14:32   Dg Pelvis 1-2 Views  07/03/2015   CLINICAL DATA:  Left hip pain for 3 days.  EXAM: PELVIS - 1-2 VIEW  COMPARISON:  08/24/2011  FINDINGS: Remote posttraumatic changes involving the right hip from previous intertrochanteric fracture with a gamma nail and dynamic hip screw fixation. No complicating features.  Remote healed left-sided pubic rami fractures are noted. No acute fractures are identified. The left hip is normally located. No left hip fracture. Advanced  degenerative changes involving the lower lumbar spine.  IMPRESSION: No acute bony findings.   Electronically Signed   By: Marijo Sanes M.D.   On: 07/03/2015 10:15   I have personally reviewed and evaluated these images and lab results as part of my medical decision-making.   EKG Interpretation None      MDM   Final diagnoses:  Groin pain, left    Patient with groin pain. Swelling of both legs. Worse on left side. Negative Doppler. CT scan done due to to anticoagulation and to evaluate for possible retroperitoneal hematoma. Will discharge home    Davonna Belling, MD 07/04/15 [phone]

## 2015-07-03 NOTE — ED Notes (Signed)
Vascular US at bedside.

## 2015-07-03 NOTE — Progress Notes (Signed)
VASCULAR LAB PRELIMINARY  PRELIMINARY  PRELIMINARY  PRELIMINARY  Right lower extremity venous duplex completed.    Preliminary report:  Left:  No evidence of DVT, superficial thrombosis, or Baker's cyst.   [name], RVT 07/03/2015, 11:07 AM

## 2015-07-03 NOTE — ED Notes (Signed)
Patient transported to CT 

## 2015-07-03 NOTE — ED Notes (Signed)
Patient transported to X-ray 

## 2015-07-17 DIAGNOSIS — M810 Age-related osteoporosis without current pathological fracture: Secondary | ICD-10-CM | POA: Diagnosis not present

## 2015-07-17 DIAGNOSIS — R609 Edema, unspecified: Secondary | ICD-10-CM | POA: Diagnosis not present

## 2015-07-17 DIAGNOSIS — Z0000 Encounter for general adult medical examination without abnormal findings: Secondary | ICD-10-CM | POA: Diagnosis not present

## 2015-07-17 DIAGNOSIS — I878 Other specified disorders of veins: Secondary | ICD-10-CM | POA: Diagnosis not present

## 2015-07-17 DIAGNOSIS — R0600 Dyspnea, unspecified: Secondary | ICD-10-CM | POA: Diagnosis not present

## 2015-07-17 DIAGNOSIS — Z1389 Encounter for screening for other disorder: Secondary | ICD-10-CM | POA: Diagnosis not present

## 2015-07-17 DIAGNOSIS — Z79899 Other long term (current) drug therapy: Secondary | ICD-10-CM | POA: Diagnosis not present

## 2015-07-17 DIAGNOSIS — I4891 Unspecified atrial fibrillation: Secondary | ICD-10-CM | POA: Diagnosis not present

## 2015-07-19 ENCOUNTER — Telehealth: Payer: Self-pay | Admitting: Nurse Practitioner

## 2015-07-19 NOTE — Telephone Encounter (Signed)
New message     Pt c/o medication issue:  1. Name of Medication: verapamil 2. How are you currently taking this medication (dosage and times per day)? 120mg  daily 3. Are you having a reaction (difficulty breathing--STAT)? no 4. What is your medication issue? Pt is having extreme swelling in her legs-more on left side---could it be the medication?  Sometimes her legs ooze. Pt has an appt with [name] at the end of the month.  She request to talk to [name] if possible

## 2015-07-19 NOTE — Telephone Encounter (Signed)
Left message for patient to call me back. I let her know [name] is not in today but will be back on Mon.  I can mover her 9/30 appointment up to Daniel 9/20 at 10:30 if needed

## 2015-07-19 NOTE — Telephone Encounter (Signed)
Will forward to [place] Jansky RN with Dr Lovena Le

## 2015-07-26 NOTE — Telephone Encounter (Signed)
Pt calling back and leg, ankles and feet edema worse, itching, burning, redness and oozing left leg worse- taking Benadryl wants to know if this is ok-pls call [phone]

## 2015-07-29 NOTE — Telephone Encounter (Signed)
Pt is aware ok to take Benadryl and moved up pt appointment to tomorrow. Pt agreeable to plan

## 2015-07-29 NOTE — Telephone Encounter (Signed)
She can use Benadryl but I doubt this will totally resolve the situation.  See if she wants to move up her OV with me.

## 2015-07-30 ENCOUNTER — Encounter: Payer: Self-pay | Admitting: Nurse Practitioner

## 2015-07-30 ENCOUNTER — Ambulatory Visit (INDEPENDENT_AMBULATORY_CARE_PROVIDER_SITE_OTHER): Payer: Medicare Other | Admitting: Nurse Practitioner

## 2015-07-30 VITALS — BP 148/80 | HR 83 | Ht 59.0 in | Wt 121.1 lb

## 2015-07-30 DIAGNOSIS — I5032 Chronic diastolic (congestive) heart failure: Secondary | ICD-10-CM | POA: Diagnosis not present

## 2015-07-30 DIAGNOSIS — I480 Paroxysmal atrial fibrillation: Secondary | ICD-10-CM | POA: Diagnosis not present

## 2015-07-30 MED ORDER — METOPROLOL SUCCINATE ER 25 MG PO TB24: 25.0000 mg | ORAL_TABLET | Freq: Every day | ORAL | Status: DC

## 2015-07-30 MED ORDER — CEPHALEXIN 250 MG PO CAPS: 250.0000 mg | ORAL_CAPSULE | Freq: Four times a day (QID) | ORAL | Status: DC

## 2015-07-30 NOTE — Patient Instructions (Addendum)
We will be checking the following labs today - NONE   Medication Instructions:    Continue with your current medicines.   I am stopping the Verapamil  I am putting you back on Toprol XL 25 mg to take just once each AM   I am sending in a RX for Keflex 250 mg to take 4 times a day for one week to help with the redness/heat in your legs  Ok to use plain Claritin, Zyrtec or Allegra as needed for itching    Testing/Procedures To Be Arranged:  N/A  Follow-Up:   See me in one month   Other Special Instructions:   N/A  Call the [place] office at [phone] if you have any questions, problems or concerns.

## 2015-07-30 NOTE — Progress Notes (Signed)
CARDIOLOGY OFFICE NOTE  Date:  07/30/2015    [name] Date of Birth: June 10, 1928 Medical Record [id number]  PCP:  Mathews Argyle, MD  Cardiologist:  Lovena Le    Chief Complaint  Patient presents with  . [name]    Follow up visit - seen for Dr. Lovena Le    History of Present Illness: [name] is a 81 y.o. female who presents today for a follow up visit. She is seen for Dr. Lovena Le. She has a history of paroxysmal [name], dyslipidemia, hypertension, and diastolic heart failure. The patient has rheumatoid arthritis. Her Veronica arrhythmias appear to be well-controlled.   Last seen by Dr. Lovena Le back in May - felt to be doing ok from our standpoint.   Comes in today. Here alone. Quite talkative. Has more swelling in her legs - worse since being switched to Verapamil. Her legs feel warm. She feels a tingling/prickling sensation in her legs and feet. Her legs itch - she is using Benadryl. No problems with her rhythm. No chest pain. Easily winded. Lots of joint issues. Echo showed good EF. Recent duplex - for DVT. Not able to put on support stockings due to her arthritis and does not have anyone that really checks in on her on a daily basis. She notes the Pradaxa is quite expensive for her.   Past Medical History  Diagnosis Date  . Hypercholesteremia   . Osteoporosis   . PAF (paroxysmal [name])     a. Intolerant to amiodarone, discontinued October 2012.  . Arthritis     severe  . Stroke 2007    mini stroke or TIA with aphasia   . Essential hypertension   . Chronic diastolic CHF (congestive heart failure)   . Memory deficit 05/24/2013  . Degenerative arthritis   . Sinus bradycardia     Past Surgical History  Procedure Laterality Date  . Replacement total knee bilateral Right 1999  . Right shoulder replacement  2009  . Right intertrochanteric hip fracture status post orif  August 2012  . Femur fracture surgery  8/12  . Cataract  extraction  2009    ou  . Left knee  2001     Medications: Current Outpatient Prescriptions  Medication Sig Dispense Refill  . BELSOMRA 20 MG TABS TAKE 1 TABLET BY MOUTH EVERY NIGHT AT BEDTIME AS NEEDED 30 tablet 3  . furosemide (LASIX) 20 MG tablet Take 1 tablet (20 mg total) by mouth daily. 30 tablet 3  . HYDROmorphone (DILAUDID) 2 MG tablet Take 0.5 tablets (1 mg total) by mouth every 6 (six) hours as needed for severe pain. 6 tablet 0  . PRADAXA 75 MG CAPS capsule TAKE 1 CAPSULE BY MOUTH EVERY 12 HOURS 60 capsule 6  . rivastigmine (EXELON) 3 MG capsule Take 1 capsule (3 mg total) by mouth 2 (two) times daily. 60 capsule 5  . traMADol (ULTRAM) 50 MG tablet Take 50 mg by mouth 2 (two) times daily.     . verapamil (VERELAN PM) 120 MG 24 hr capsule Take 1 capsule (120 mg total) by mouth at bedtime. 90 capsule 3  . VOLTAREN 1 % GEL Apply 1 application topically as needed. For leg pain  3   No current facility-administered medications for this visit.    Allergies: Allergies  Allergen Reactions  . Codeine Nausea Only    Social History: The patient  reports that she has never smoked. She has never used smokeless tobacco. She reports  that she drinks about 4.2 oz of alcohol per week. She reports that she does not use illicit drugs.   Family History: The patient's family history includes Cancer - Colon in her father; Coronary artery disease in an other family member; Dementia in her mother; Stroke in her mother.   Review of Systems: Please see the history of present illness.   Otherwise, the review of systems is positive for none.   All other systems are reviewed and negative.   Physical Exam: VS:  BP 148/80 mmHg  Pulse 83  Ht 4\' 11"  (1.499 m)  Wt 121 lb 1.9 oz (54.94 kg)  BMI 24.45 kg/m2  SpO2 96% .  BMI Body mass index is 24.45 kg/(m^2).  Wt Readings from Last 3 Encounters:  07/30/15 121 lb 1.9 oz (54.94 kg)  07/03/15 120 lb (54.432 kg)  04/08/15 120 lb 9.6 oz (54.704 kg)     General: Pleasant. Elderly female who is in no acute distress.  HEENT: Normal. Neck: Supple, no JVD, carotid bruits, or masses noted.  Cardiac: Regular rate and rhythm. No murmurs, rubs, or gallops. +1 edema. Legs are tight. Legs are warm to touch R>L.  Respiratory:  Lungs are clear to auscultation bilaterally with normal work of breathing.  GI: Soft and nontender.  MS: No deformity or atrophy. Gait and ROM intact.Using a cane.  Skin: Warm and dry. Color is normal.  Neuro:  Strength and sensation are intact and no gross focal deficits noted.  Psych: Alert, appropriate and with normal affect.   LABORATORY DATA:  EKG:  EKG is not ordered today.  Lab Results  Component Value Date   WBC 7.7 07/03/2015   HGB 14.3 07/03/2015   HCT 42.0 07/03/2015   PLT 122* 07/03/2015   GLUCOSE 119* 07/03/2015   ALT 19 08/19/2011   AST 17 08/19/2011   NA 132* 07/03/2015   K 4.9 07/03/2015   CL 97* 07/03/2015   CREATININE 0.80 07/03/2015   BUN 25* 07/03/2015   CO2 30 01/25/2015   TSH 1.56 01/23/2015   INR 1.9 01/26/2012    BNP (last 3 results)  Recent Labs  07/03/15 1018  BNP 413.6*    ProBNP (last 3 results)  Recent Labs  01/23/15 1226  PROBNP 337.0*     Other Studies Reviewed Today:  Echo Study Conclusions from 03/2015  - Left ventricle: The cavity size was normal. Wall thickness was increased in a pattern of mild LVH. Systolic function was normal. The estimated ejection fraction was in the range of 55% to 60%. Doppler parameters are consistent with abnormal left ventricular relaxation (grade 1 diastolic dysfunction). - Aortic valve: There was mild regurgitation. - Mitral valve: Calcified annulus. Mildly thickened leaflets . There was mild regurgitation. - Veronica septum: No defect or patent foramen ovale was identified. - Pulmonary arteries: PA peak pressure: 35 mm Hg (S).    Lower Extremity Duplex Summary: No evidence of deep vein thrombosis involving  the right common femoral vein and left lower extremity.  Other specific details can be found in the table(s) above. Prepared and Electronically Authenticated by  Gae Gallop MD 2016-09-02T14:05:48  Assessment/Plan: 1. Edema - most like venous insufficiency - most likely exacerbated by CCB therapy. Will switch her back to Toprol XL 25 mg  2. Possible early cellulitis - one week of Keflex sent in for her 250 mg to take QID for one week.   3. PAF - holding in sinus  4. Chronic anticoagulation - tolerating ok but put  her in the donut [facility] back in May - looking for samples.   5. Advanced age.   Current medicines are reviewed with the patient today.  The patient does not have concerns regarding medicines other than what has been noted above.  The following changes have been made:  See above.  Labs/ tests ordered today include:   No orders of the defined types were placed in this encounter.     Disposition:   FU with me in 4 weeks.   Patient is agreeable to this plan and will call if any problems develop in the interim.   Signed: Burtis Junes, RN, ANP-C 07/30/2015 3:38 PM  [facility] [address] [place], [place]  [address] Phone: [phone] Fax: [phone]

## 2015-08-01 ENCOUNTER — Telehealth: Payer: Self-pay | Admitting: Nurse Practitioner

## 2015-08-01 NOTE — Telephone Encounter (Signed)
Pt wanted to know how to antibiotic, stated morning, noon, evening, and at bedtime would be fine, stated pt might need to eat a little something at the bedtime dose.  Pt stated was talking to pharmacist and would the length of time pt is on antibiotic be sufficient. Stated according to [facility] yes.

## 2015-08-01 NOTE — Telephone Encounter (Signed)
New message     Pt has been prescribed cephalexin four times a day.  She want to know if she takes this every 6hrs or morning, noon, evening, and at bedtime. Please call

## 2015-08-02 ENCOUNTER — Ambulatory Visit: Payer: Medicare Other | Admitting: Nurse Practitioner

## 2015-08-07 ENCOUNTER — Telehealth: Payer: Self-pay | Admitting: Nurse Practitioner

## 2015-08-07 DIAGNOSIS — I872 Venous insufficiency (chronic) (peripheral): Secondary | ICD-10-CM

## 2015-08-07 MED ORDER — FUROSEMIDE 40 MG PO TABS: 40.0000 mg | ORAL_TABLET | Freq: Every day | ORAL | Status: DC

## 2015-08-07 NOTE — Telephone Encounter (Signed)
Patient st her legs have not improved since her OV with [name]. Her legs are tight, swollen, warm, red and she says "feels and looks bumpy like an orange peel." She is having trouble walking because her ankles are so tight. She took her Keflex as directed and runs out today. She has no VS to report. She is extremely frustrated and upset. She would like further recommendations from [place].

## 2015-08-07 NOTE — Telephone Encounter (Signed)
Instructed patient to INCREASE LASIX to 40 mg daily. [facility] referral placed. Verified with patient that legs are no worse than when [name] assessed them and confirmed with [name] that no more abx are needed.  Patient agrees with treatment plan.

## 2015-08-07 NOTE — Telephone Encounter (Signed)
New Message      Pt calling stating that she was prescribed an antibiotic by [name] NP and she states that it runs out today and wants to know if she needs a refill, if so she needs to know today so she can go to the pharmacy. Please call back.

## 2015-08-07 NOTE — Telephone Encounter (Signed)
[name], Thanks for talking with her.   We can try to increase her Lasix to 40 mg a day   We can send to [facility] for evaluation as well.   I do not think she needs further antibiotics at this time unless legs are staying hot and red.

## 2015-08-14 DIAGNOSIS — Z23 Encounter for immunization: Secondary | ICD-10-CM | POA: Diagnosis not present

## 2015-08-27 ENCOUNTER — Ambulatory Visit: Payer: Medicare Other | Admitting: Nurse Practitioner

## 2015-09-06 ENCOUNTER — Other Ambulatory Visit: Payer: Self-pay

## 2015-09-06 DIAGNOSIS — I872 Venous insufficiency (chronic) (peripheral): Secondary | ICD-10-CM

## 2015-09-06 DIAGNOSIS — R609 Edema, unspecified: Secondary | ICD-10-CM

## 2015-10-01 ENCOUNTER — Encounter: Payer: Medicare Other | Admitting: Vascular Surgery

## 2015-10-01 ENCOUNTER — Encounter (HOSPITAL_COMMUNITY): Payer: Medicare Other

## 2015-10-04 ENCOUNTER — Encounter: Payer: Self-pay | Admitting: Vascular Surgery

## 2015-10-04 DIAGNOSIS — L821 Other seborrheic keratosis: Secondary | ICD-10-CM | POA: Diagnosis not present

## 2015-10-04 DIAGNOSIS — I8312 Varicose veins of left lower extremity with inflammation: Secondary | ICD-10-CM | POA: Diagnosis not present

## 2015-10-04 DIAGNOSIS — I8311 Varicose veins of right lower extremity with inflammation: Secondary | ICD-10-CM | POA: Diagnosis not present

## 2015-10-04 DIAGNOSIS — I872 Venous insufficiency (chronic) (peripheral): Secondary | ICD-10-CM | POA: Diagnosis not present

## 2015-10-09 ENCOUNTER — Ambulatory Visit: Payer: Medicare Other | Admitting: Nurse Practitioner

## 2015-10-09 ENCOUNTER — Encounter: Payer: Self-pay | Admitting: Vascular Surgery

## 2015-10-09 ENCOUNTER — Ambulatory Visit (HOSPITAL_COMMUNITY)
Admission: RE | Admit: 2015-10-09 | Discharge: 2015-10-09 | Disposition: A | Discharge status: Home / Self Care | Payer: Medicare Other | Source: Ambulatory Visit | Attending: Vascular Surgery | Admitting: Vascular Surgery

## 2015-10-09 ENCOUNTER — Ambulatory Visit (INDEPENDENT_AMBULATORY_CARE_PROVIDER_SITE_OTHER): Payer: Medicare Other | Admitting: Vascular Surgery

## 2015-10-09 VITALS — BP 117/73 | HR 77 | Temp 98.3°F | Resp 16 | Ht 59.0 in | Wt 118.0 lb

## 2015-10-09 DIAGNOSIS — I872 Venous insufficiency (chronic) (peripheral): Secondary | ICD-10-CM

## 2015-10-09 DIAGNOSIS — R609 Edema, unspecified: Secondary | ICD-10-CM | POA: Insufficient documentation

## 2015-10-09 NOTE — Progress Notes (Signed)
[facility] [place]  Patient name: [name] MRN: [id number] DOB: 07-Nov-1927 Sex: female  REASON FOR CONSULT: Chronic venous insufficiency. Referred by [name], RN, ANP-C  HPI: [name] is a 81 y.o. female, who is referred for evaluation of chronic venous insufficiency. She tells me that her leg swelling started this summer and gradually progressed. She describes aching pain in her legs which is aggravated by standing and relieved somewhat with elevation. There was some concern that the swelling could be related to her medications which were adjusted but she also had evidence of significant chronic venous insufficiency. She was therefore sent for vascular evaluation. She is unaware of any previous history of DVT or phlebitis. She remains fairly active for her age. I do not get any history of claudication, rest pain, or nonhealing ulcers.  She has had bilateral knee replacements. She has significant osteoarthritis of multiple joints.  Past Medical History  Diagnosis Date  . Hypercholesteremia   . Osteoporosis   . PAF (paroxysmal atrial fibrillation) ([place])     a. Intolerant to amiodarone, discontinued October 2012.  . Arthritis     severe  . Stroke [facility]) 2007    mini stroke or TIA with aphasia   . Essential hypertension   . Chronic diastolic CHF (congestive heart failure) ([place])   . Memory deficit 05/24/2013  . Degenerative arthritis   . Sinus bradycardia     Family History  Problem Relation Age of Onset  . Coronary artery disease    . Dementia Mother   . Stroke Mother   . Cancer - Colon Father     SOCIAL HISTORY: Social History   Social History  . Marital Status: Widowed    Spouse Name: N/A  . Number of Children: 2  . Years of Education: MA   Occupational History  . RETIRED    Social History Main Topics  . Smoking status: Never Smoker   . Smokeless tobacco: Never Used  . Alcohol Use: 4.2 oz/week    7 Glasses of wine per week   Comment: 1 glass wine with dinner some days  . Drug Use: No  . Sexual Activity: Not on file   Other Topics Concern  . Not on file   Social History Narrative   Patient drinks 1 cup of coffee daily.   Patient is right handed.       Allergies  Allergen Reactions  . Codeine Nausea Only    Current Outpatient Prescriptions  Medication Sig Dispense Refill  . BELSOMRA 20 MG TABS TAKE 1 TABLET BY MOUTH EVERY NIGHT AT BEDTIME AS NEEDED 30 tablet 3  . cephALEXin (KEFLEX) 250 MG capsule Take 1 capsule (250 mg total) by mouth 4 (four) times daily. 28 capsule 0  . furosemide (LASIX) 40 MG tablet Take 1 tablet (40 mg total) by mouth daily.    Marland Kitchen HYDROmorphone (DILAUDID) 2 MG tablet Take 0.5 tablets (1 mg total) by mouth every 6 (six) hours as needed for severe pain. 6 tablet 0  . metoprolol succinate (TOPROL XL) 25 MG 24 hr tablet Take 1 tablet (25 mg total) by mouth daily.    Marland Kitchen PRADAXA 75 MG CAPS capsule TAKE 1 CAPSULE BY MOUTH EVERY 12 HOURS 60 capsule 6  . rivastigmine (EXELON) 3 MG capsule Take 1 capsule (3 mg total) by mouth 2 (two) times daily. 60 capsule 5  . traMADol (ULTRAM) 50 MG tablet Take 50 mg by mouth 2 (two) times daily.     Marland Kitchen  verapamil (VERELAN PM) 120 MG 24 hr capsule Take 1 capsule (120 mg total) by mouth at bedtime. 90 capsule 3  . VOLTAREN 1 % GEL Apply 1 application topically as needed. For leg pain  3   No current facility-administered medications for this visit.    REVIEW OF SYSTEMS:  [X]  denotes positive finding, [ ]  denotes negative finding Cardiac  Comments:  Chest pain or chest pressure:    Shortness of breath upon exertion: X   Short of breath when lying flat:    Irregular heart rhythm:        Vascular    Pain in calf, thigh, or hip brought on by ambulation:    Pain in feet at night that wakes you up from your sleep:     Blood clot in your veins:    Leg swelling:  X       Pulmonary    Oxygen at home:    Productive cough:     Wheezing:           Neurologic    Sudden weakness in arms or legs:     Sudden numbness in arms or legs:     Sudden onset of difficulty speaking or slurred speech:    Temporary loss of vision in one eye:     Problems with dizziness:  X       Gastrointestinal    Blood in stool:     Vomited blood:         Genitourinary    Burning when urinating:     Blood in urine:        Psychiatric    Major depression:         Hematologic    Bleeding problems:    Problems with blood clotting too easily:        Skin    Rashes or ulcers:        Constitutional    Fever or chills:      PHYSICAL EXAM: Filed Vitals:   10/09/15 1458  BP: 117/73  Pulse: 77  Temp: 98.3 F (36.8 C)  TempSrc: Oral  Resp: 16  Height: 4\' 11"  (1.499 m)  Weight: 118 lb (53.524 kg)  SpO2: 95%    GENERAL: The patient is a well-nourished female, in no acute distress. The vital signs are documented above. CARDIAC: There is a regular rate and rhythm.  VASCULAR: I do not detect carotid bruits. She has palpable femoral pulses. I cannot palpate pedal pulses although she does have brisk Doppler signals in the dorsalis pedis and posterior tibial positions bilaterally. PULMONARY: There is good air exchange bilaterally without wheezing or rales. ABDOMEN: Soft and non-tender with normal pitched bowel sounds.  MUSCULOSKELETAL: There are no major deformities or cyanosis. NEUROLOGIC: No focal weakness or paresthesias are detected. SKIN: There are no ulcers or rashes noted. PSYCHIATRIC: The patient has a normal affect.  DATA:   LOWER EXTREMITY VENOUS DUPLEX: I have independently interpreted her lower extremity venous duplex scan.  On the right side, there is no evidence of DVT. There is no evidence of deep vein reflux or reflux in the great saphenous vein or short saphenous vein.  On the left side, there is no evidence of DVT. There is reflux in the common femoral vein on the left but no other significant deep vein reflux and no reflux in  the great saphenous vein or short saphenous vein.  MEDICAL ISSUES:  CHRONIC VENOUS INSUFFICIENCY: Although her duplex scan only shows significant  reflux in the common femoral vein on the left her exam certainly suggests chronic venous insufficiency. She has significant leg swelling, hyperpigmentation, and lipo dermatosclerosis. I have discussed the importance of intermittent leg elevation the proper positioning for this. She has tried compression stockings in the past but has a very difficult time getting them on. This reasonable to try a redundant mild gradient knee-high stocking and also have instructed her on a device that will help her get her stockings on. In addition we discussed the importance of keeping her skin well lubricated especially in the winter. I have encouraged her to avoid prolonged sitting and standing. I have encouraged her to continue to stay as active as possible and to consider water aerobics which I think is also helpful for people with chronic venous insufficiency. I will be happy to see her back at any time if her symptoms progress.   Deitra Mayo [facility] [place]: [phone]

## 2015-10-14 DIAGNOSIS — M533 Sacrococcygeal disorders, not elsewhere classified: Secondary | ICD-10-CM | POA: Diagnosis not present

## 2015-10-14 DIAGNOSIS — M791 Myalgia: Secondary | ICD-10-CM | POA: Diagnosis not present

## 2015-10-16 ENCOUNTER — Encounter: Payer: Medicare Other | Admitting: Vascular Surgery

## 2015-10-16 ENCOUNTER — Encounter (HOSPITAL_COMMUNITY): Payer: Medicare Other

## 2015-10-22 DIAGNOSIS — M533 Sacrococcygeal disorders, not elsewhere classified: Secondary | ICD-10-CM | POA: Diagnosis not present

## 2015-10-29 ENCOUNTER — Other Ambulatory Visit: Payer: Self-pay | Admitting: Neurology

## 2015-11-08 ENCOUNTER — Other Ambulatory Visit: Payer: Self-pay | Admitting: Internal Medicine

## 2015-11-08 MED ORDER — FUROSEMIDE 40 MG PO TABS: 40.0000 mg | ORAL_TABLET | Freq: Every day | ORAL | Status: DC

## 2015-11-08 NOTE — Telephone Encounter (Signed)
[name]  08/07/2015  Telephone  MRN:  [id number]   Description: 82 year old female  Provider: Burtis Junes, NP  Department: [address] [facility]       Call Documentation      Theodoro Parma, RN at 08/07/2015 1:22 PM     Status: Signed       Expand All Collapse All   Instructed patient to INCREASE LASIX to 40 mg daily. [facility] referral placed. Verified with patient that legs are no worse than when [name] assessed them and confirmed with [name] that no more abx are needed.  Patient agrees with treatment plan.            Burtis Junes, NP at 08/07/2015 12:53 PM     Status: Signed       Expand All Collapse All   [name], Thanks for talking with her.   We can try to increase her Lasix to 40 mg a day   We can send to [facility] for evaluation as well.   I do not think she needs further antibiotics at this time unless legs are staying hot and red.               Theodoro Parma, RN at 08/07/2015 10:40 AM     Status: Signed       Expand All Collapse All   Patient st her legs have not improved since her OV with [name]. Her legs are tight, swollen, warm, red and she says "feels and looks bumpy like an orange peel." She is having trouble walking because her ankles are so tight. She took her Keflex as directed and runs out today. She has no VS to report. She is extremely frustrated and upset. She would like further recommendations from [place].            [name] Respus at 08/07/2015 10:25 AM     Status: Signed       Expand All Collapse All   New Message      Pt calling stating that she was prescribed an antibiotic by [name] NP and she states that it runs out today and wants to know if she needs a refill, if so she needs to know today so she can go to the pharmacy. Please call back.

## 2015-11-26 ENCOUNTER — Ambulatory Visit
Admission: RE | Admit: 2015-11-26 | Discharge: 2015-11-26 | Disposition: A | Discharge status: Home / Self Care | Payer: Medicare Other | Source: Ambulatory Visit | Attending: Geriatric Medicine | Admitting: Geriatric Medicine

## 2015-11-26 ENCOUNTER — Other Ambulatory Visit: Payer: Self-pay | Admitting: Geriatric Medicine

## 2015-11-26 DIAGNOSIS — I509 Heart failure, unspecified: Secondary | ICD-10-CM | POA: Diagnosis not present

## 2015-11-26 DIAGNOSIS — I4891 Unspecified atrial fibrillation: Secondary | ICD-10-CM | POA: Diagnosis not present

## 2015-11-26 DIAGNOSIS — R05 Cough: Secondary | ICD-10-CM

## 2015-11-26 DIAGNOSIS — Z79899 Other long term (current) drug therapy: Secondary | ICD-10-CM | POA: Diagnosis not present

## 2015-11-26 DIAGNOSIS — I10 Essential (primary) hypertension: Secondary | ICD-10-CM | POA: Diagnosis not present

## 2015-11-26 DIAGNOSIS — R0602 Shortness of breath: Secondary | ICD-10-CM | POA: Diagnosis not present

## 2015-11-26 DIAGNOSIS — R059 Cough, unspecified: Secondary | ICD-10-CM

## 2015-11-26 DIAGNOSIS — J309 Allergic rhinitis, unspecified: Secondary | ICD-10-CM | POA: Diagnosis not present

## 2015-11-26 DIAGNOSIS — G4700 Insomnia, unspecified: Secondary | ICD-10-CM | POA: Diagnosis not present

## 2015-11-26 DIAGNOSIS — D696 Thrombocytopenia, unspecified: Secondary | ICD-10-CM | POA: Diagnosis not present

## 2015-11-27 DIAGNOSIS — H524 Presbyopia: Secondary | ICD-10-CM | POA: Diagnosis not present

## 2015-11-27 DIAGNOSIS — H04123 Dry eye syndrome of bilateral lacrimal glands: Secondary | ICD-10-CM | POA: Diagnosis not present

## 2015-12-02 ENCOUNTER — Other Ambulatory Visit: Payer: Self-pay

## 2015-12-02 ENCOUNTER — Other Ambulatory Visit: Payer: Self-pay | Admitting: Neurology

## 2015-12-02 MED ORDER — RIVASTIGMINE TARTRATE 3 MG PO CAPS: 3.0000 mg | ORAL_CAPSULE | Freq: Two times a day (BID) | ORAL | Status: DC

## 2015-12-17 DIAGNOSIS — L821 Other seborrheic keratosis: Secondary | ICD-10-CM | POA: Diagnosis not present

## 2015-12-17 DIAGNOSIS — L720 Epidermal cyst: Secondary | ICD-10-CM | POA: Diagnosis not present

## 2015-12-19 DIAGNOSIS — H0012 Chalazion right lower eyelid: Secondary | ICD-10-CM | POA: Diagnosis not present

## 2015-12-30 ENCOUNTER — Other Ambulatory Visit: Payer: Self-pay | Admitting: Neurology

## 2015-12-31 ENCOUNTER — Telehealth: Payer: Self-pay | Admitting: Neurology

## 2015-12-31 MED ORDER — RIVASTIGMINE TARTRATE 3 MG PO CAPS: 3.0000 mg | ORAL_CAPSULE | Freq: Two times a day (BID) | ORAL | Status: DC

## 2015-12-31 MED ORDER — SUVOREXANT 20 MG PO TABS: 1.0000 | ORAL_TABLET | Freq: Every evening | ORAL | Status: DC | PRN

## 2015-12-31 NOTE — Telephone Encounter (Signed)
I called the patient. She needs a RF on the Exelon and the Belsomra.  She also needs a RV.

## 2015-12-31 NOTE — Telephone Encounter (Signed)
Pt called request refill for rivastigmine (EXELON) 3 MG capsule . Pt is out of medication. Pt has been told by pharmacy medication can not be refilled. Pt is upset and is requesting to speak with Dr Jannifer Franklin. She does not want to speak with RN.

## 2015-12-31 NOTE — Telephone Encounter (Signed)
Left message for patient to return my call.  Prescriptions have been sent to the pharmacy (Exelon sent electronically, Belsomra faxed).  She needs to have a follow up appt scheduled for her.

## 2016-01-01 NOTE — Telephone Encounter (Signed)
Spoke to patient - says she is stable and doing well - a follow up appointment has been scheduled with Hoyle Sauer on 01/13/16.

## 2016-01-01 NOTE — Telephone Encounter (Signed)
Left another message of her answering machine.

## 2016-01-07 ENCOUNTER — Encounter: Payer: Self-pay | Admitting: *Deleted

## 2016-01-08 DIAGNOSIS — H04123 Dry eye syndrome of bilateral lacrimal glands: Secondary | ICD-10-CM | POA: Diagnosis not present

## 2016-01-08 DIAGNOSIS — H0012 Chalazion right lower eyelid: Secondary | ICD-10-CM | POA: Diagnosis not present

## 2016-01-09 ENCOUNTER — Telehealth: Payer: Self-pay

## 2016-01-09 NOTE — Telephone Encounter (Signed)
Received a notice from [facility] that pt's belsomra is not covered on their formulary. I completed pa request. Will wait for decision.

## 2016-01-13 ENCOUNTER — Ambulatory Visit: Payer: Self-pay | Admitting: Nurse Practitioner

## 2016-01-13 NOTE — Telephone Encounter (Signed)
Received notices from [facility] that the pa request was disregarded because there is already an exemption for this medication.

## 2016-01-17 DIAGNOSIS — M810 Age-related osteoporosis without current pathological fracture: Secondary | ICD-10-CM | POA: Diagnosis not present

## 2016-01-21 DIAGNOSIS — H0012 Chalazion right lower eyelid: Secondary | ICD-10-CM | POA: Diagnosis not present

## 2016-01-21 DIAGNOSIS — H04123 Dry eye syndrome of bilateral lacrimal glands: Secondary | ICD-10-CM | POA: Diagnosis not present

## 2016-01-22 ENCOUNTER — Encounter: Payer: Self-pay | Admitting: Nurse Practitioner

## 2016-01-22 ENCOUNTER — Ambulatory Visit (INDEPENDENT_AMBULATORY_CARE_PROVIDER_SITE_OTHER): Payer: Medicare Other | Admitting: Nurse Practitioner

## 2016-01-22 VITALS — BP 111/68 | HR 72 | Ht 59.0 in | Wt 127.4 lb

## 2016-01-22 DIAGNOSIS — G4700 Insomnia, unspecified: Secondary | ICD-10-CM | POA: Insufficient documentation

## 2016-01-22 DIAGNOSIS — R413 Other amnesia: Secondary | ICD-10-CM

## 2016-01-22 NOTE — Progress Notes (Signed)
I have read the note, and I agree with the clinical assessment and plan.  Oluwateniola Leitch KEITH   

## 2016-01-22 NOTE — Progress Notes (Signed)
[facility]  PATIENT: [name] DOB: Jan 16, 1928   REASON FOR VISIT: Follow-up for progressive memory loss HISTORY FROM: Patient    HISTORY OF PRESENT ILLNESS:Ms. You is an 82 year old right-handed white female with a history of a progressive memory disorder. The patient has been on Aricept, but she could not tolerate the medication secondary to diarrhea. The patient was switched over to Exelon, currently  on 3 mg capsules twice daily. She is tolerating the drug without side effects. She continues to live by herself, independent in all activities of daily living, continues to drive without difficulty has not gotten lost. The patient returns for further evaluation.  REVIEW OF SYSTEMS: Full 14 system review of systems performed and notable only for those listed, all others are neg:  Constitutional: neg  Cardiovascular: neg Ear/Nose/Throat: neg  Skin: neg Eyes: neg Respiratory: Shortness of breath Gastroitestinal: neg  Hematology/Lymphatic: Easy bruising  Endocrine: neg Musculoskeletal: Arthritis Allergy/Immunology: neg Neurological: Memory loss Psychiatric: neg Sleep : neg   ALLERGIES: Allergies  Allergen Reactions  . Codeine Nausea Only    HOME MEDICATIONS: Outpatient Prescriptions Prior to Visit  Medication Sig Dispense Refill  . furosemide (LASIX) 40 MG tablet Take 1 tablet (40 mg total) by mouth daily. 30 tablet 3  . metoprolol succinate (TOPROL XL) 25 MG 24 hr tablet Take 1 tablet (25 mg total) by mouth daily.    . rivastigmine (EXELON) 3 MG capsule Take 1 capsule (3 mg total) by mouth 2 (two) times daily. 60 capsule 2  . Suvorexant (BELSOMRA) 20 MG TABS Take 1 tablet by mouth at bedtime as needed. 30 tablet 3  . VOLTAREN 1 % GEL Apply 1 application topically as needed. For leg pain  3  . traMADol (ULTRAM) 50 MG tablet Take 50 mg by mouth 2 (two) times daily. Reported on 01/22/2016    . cephALEXin (KEFLEX) 250 MG capsule Take 1 capsule (250 mg  total) by mouth 4 (four) times daily. 28 capsule 0  . HYDROmorphone (DILAUDID) 2 MG tablet Take 0.5 tablets (1 mg total) by mouth every 6 (six) hours as needed for severe pain. 6 tablet 0  . PRADAXA 75 MG CAPS capsule TAKE 1 CAPSULE BY MOUTH EVERY 12 HOURS 60 capsule 6  . verapamil (VERELAN PM) 120 MG 24 hr capsule Take 1 capsule (120 mg total) by mouth at bedtime. 90 capsule 3   No facility-administered medications prior to visit.    PAST MEDICAL HISTORY: Past Medical History  Diagnosis Date  . Hypercholesteremia   . Osteoporosis   . PAF (paroxysmal atrial fibrillation) ([place])     a. Intolerant to amiodarone, discontinued October 2012.  . Arthritis     severe  . Stroke [facility]) 2007    mini stroke or TIA with aphasia   . Essential hypertension   . Chronic diastolic CHF (congestive heart failure) ([place])   . Memory deficit 05/24/2013  . Degenerative arthritis   . Sinus bradycardia   . Atrial fibrillation ([place])   . Chronic insomnia     PAST SURGICAL HISTORY: Past Surgical History  Procedure Laterality Date  . Replacement total knee bilateral Right 1999  . Right shoulder replacement  2009  . Right intertrochanteric hip fracture status post orif  August 2012  . Femur fracture surgery  8/12  . Cataract extraction  2009    ou  . Left knee  2001    FAMILY HISTORY: Family History  Problem Relation Age of Onset  . Coronary artery  disease    . Dementia Mother   . Stroke Mother   . Cancer - Colon Father   . Cancer Father   . Cancer Sister   . Cancer Son   . Cancer Sister     SOCIAL HISTORY: Social History   Social History  . Marital Status: Widowed    Spouse Name: N/A  . Number of Children: 2  . Years of Education: MA   Occupational History  . RETIRED    Social History Main Topics  . Smoking status: Never Smoker   . Smokeless tobacco: Never Used  . Alcohol Use: 4.2 oz/week    7 Glasses of wine per week     Comment: 1 glass wine with dinner some days  . Drug Use:  No  . Sexual Activity: Not on file   Other Topics Concern  . Not on file   Social History Narrative   Patient drinks 1 cup of coffee daily.   Patient is right handed.        PHYSICAL EXAM  Filed Vitals:   01/22/16 1458  BP: 111/68  Pulse: 72  Height: 4\' 11"  (1.499 m)  Weight: 127 lb 6.4 oz (57.788 kg)   Body mass index is 25.72 kg/(m^2).  Generalized: Well developed, in no acute distress  Head: normocephalic and atraumatic,. Oropharynx benign  Neck: Supple, no carotid bruits  Musculoskeletal: Arthritic changes in the hands Skin: No significant peripheral edema is noted. Neurologic Exam  Mental status: The patient is alert and oriented x 3 at the time of the examination. The patient has apparent normal recent and remote memory, with an apparently normal attention span and concentration ability. Mini-Mental Status Examination done today shows a total score of 29/30. The patient is able to name 9 animals in 30 seconds.Clock drawing 4/4.  Cranial nerves: Facial symmetry is present. Speech is normal, no aphasia or dysarthria is noted. Extraocular movements are full. Visual fields are full. Motor: The patient has good strength in all 4 extremities. Sensory examination: Soft touch sensation is symmetric on the face and arms, slightly decreased on the right leg as compared to the left. Coordination: The patient has good finger-nose-finger and heel-to-shin bilaterally. Gait and station: The patient has a normal gait. Tandem gait is unsteady. Romberg is negative, but is unsteady. No drift is seen. Ambulates with single-point cane Reflexes: Deep tendon reflexes are symmetric.  iDIAGNOSTIC DATA (LABS, IMAGING, TESTING) - I reviewed patient records, labs, notes, testing and imaging myself where available.  Lab Results  Component Value Date   WBC 7.7 07/03/2015   HGB 14.3 07/03/2015   HCT 42.0 07/03/2015   MCV 96.7 07/03/2015   PLT 122* 07/03/2015      Component Value Date/Time     NA 132* 07/03/2015 1028   K 4.9 07/03/2015 1028   CL 97* 07/03/2015 1028   CO2 30 01/25/2015 1148   GLUCOSE 119* 07/03/2015 1028   BUN 25* 07/03/2015 1028   CREATININE 0.80 07/03/2015 1028   CREATININE 0.82 01/25/2015 1148   CALCIUM 9.8 01/25/2015 1148   PROT 5.6* 08/19/2011 0405   ALBUMIN 2.5* 08/19/2011 0405   AST 17 08/19/2011 0405   ALT 19 08/19/2011 0405   ALKPHOS 150* 08/19/2011 0405   BILITOT 0.3 08/19/2011 0405   GFRNONAA 58* 09/20/2011 0600   GFRAA 68* 09/20/2011 0600    ASSESSMENT AND PLAN  82 y.o. year old female  has a past medical history of  Memory deficit (05/24/2013); Degenerative arthritis; and Chronic  insomnia. here To follow-up. Her memory score is stable.  Exelon capsules  3 mg twice daily to continue does not need refills  Belsomra to continue prn as needed for insomnia Patient also uses melantonin  10mg  at night  Use cane at all times for safe ambulation F/U in 6  months Dennie Bible, [facility], [facility], APRN  [facility] [address], [place], [place] [address] [phone]

## 2016-01-22 NOTE — Patient Instructions (Addendum)
Exelon capsules  3 mg twice daily to continue  Belsomra to continue prn as needed Patient also uses melantonin F/U in 6  months

## 2016-02-04 ENCOUNTER — Other Ambulatory Visit: Payer: Self-pay | Admitting: Podiatry

## 2016-02-04 ENCOUNTER — Other Ambulatory Visit: Payer: Self-pay | Admitting: Physician Assistant

## 2016-02-05 ENCOUNTER — Other Ambulatory Visit: Payer: Self-pay | Admitting: Nurse Practitioner

## 2016-02-05 NOTE — Telephone Encounter (Signed)
Pt must make an appt to be reevaluated.

## 2016-02-25 DIAGNOSIS — I872 Venous insufficiency (chronic) (peripheral): Secondary | ICD-10-CM | POA: Diagnosis not present

## 2016-02-25 DIAGNOSIS — I10 Essential (primary) hypertension: Secondary | ICD-10-CM | POA: Diagnosis not present

## 2016-02-25 DIAGNOSIS — M810 Age-related osteoporosis without current pathological fracture: Secondary | ICD-10-CM | POA: Diagnosis not present

## 2016-02-25 DIAGNOSIS — Z79899 Other long term (current) drug therapy: Secondary | ICD-10-CM | POA: Diagnosis not present

## 2016-02-25 DIAGNOSIS — R05 Cough: Secondary | ICD-10-CM | POA: Diagnosis not present

## 2016-02-25 DIAGNOSIS — R600 Localized edema: Secondary | ICD-10-CM | POA: Diagnosis not present

## 2016-02-25 DIAGNOSIS — I4891 Unspecified atrial fibrillation: Secondary | ICD-10-CM | POA: Diagnosis not present

## 2016-02-25 DIAGNOSIS — I509 Heart failure, unspecified: Secondary | ICD-10-CM | POA: Diagnosis not present

## 2016-03-03 DIAGNOSIS — M533 Sacrococcygeal disorders, not elsewhere classified: Secondary | ICD-10-CM | POA: Diagnosis not present

## 2016-03-09 ENCOUNTER — Other Ambulatory Visit: Payer: Self-pay | Admitting: Nurse Practitioner

## 2016-04-01 ENCOUNTER — Other Ambulatory Visit: Payer: Self-pay | Admitting: *Deleted

## 2016-04-01 MED ORDER — RIVASTIGMINE TARTRATE 3 MG PO CAPS: 3.0000 mg | ORAL_CAPSULE | Freq: Two times a day (BID) | ORAL | Status: DC

## 2016-04-15 DIAGNOSIS — M810 Age-related osteoporosis without current pathological fracture: Secondary | ICD-10-CM | POA: Diagnosis not present

## 2016-05-06 ENCOUNTER — Telehealth: Payer: Self-pay | Admitting: *Deleted

## 2016-05-06 NOTE — Telephone Encounter (Signed)
placed xarelto samples at front desk, gave her a months worth, will add a PA form in the bag, pt aware & willing to try for PA.

## 2016-05-26 ENCOUNTER — Encounter (HOSPITAL_COMMUNITY): Payer: Self-pay

## 2016-05-26 ENCOUNTER — Emergency Department (HOSPITAL_COMMUNITY): Payer: Medicare Other

## 2016-05-26 ENCOUNTER — Observation Stay (HOSPITAL_COMMUNITY)
Admission: EM | Admit: 2016-05-26 | Discharge: 2016-05-27 | Disposition: A | Discharge status: Home Health | Payer: Medicare Other | Attending: Internal Medicine | Admitting: Internal Medicine

## 2016-05-26 DIAGNOSIS — I10 Essential (primary) hypertension: Secondary | ICD-10-CM | POA: Diagnosis present

## 2016-05-26 DIAGNOSIS — I110 Hypertensive heart disease with heart failure: Principal | ICD-10-CM | POA: Insufficient documentation

## 2016-05-26 DIAGNOSIS — Z7901 Long term (current) use of anticoagulants: Secondary | ICD-10-CM | POA: Diagnosis not present

## 2016-05-26 DIAGNOSIS — Z96653 Presence of artificial knee joint, bilateral: Secondary | ICD-10-CM | POA: Insufficient documentation

## 2016-05-26 DIAGNOSIS — I482 Chronic atrial fibrillation: Secondary | ICD-10-CM | POA: Diagnosis not present

## 2016-05-26 DIAGNOSIS — I4891 Unspecified atrial fibrillation: Secondary | ICD-10-CM

## 2016-05-26 DIAGNOSIS — Z96611 Presence of right artificial shoulder joint: Secondary | ICD-10-CM | POA: Diagnosis not present

## 2016-05-26 DIAGNOSIS — I5033 Acute on chronic diastolic (congestive) heart failure: Secondary | ICD-10-CM | POA: Diagnosis not present

## 2016-05-26 DIAGNOSIS — Z79899 Other long term (current) drug therapy: Secondary | ICD-10-CM | POA: Insufficient documentation

## 2016-05-26 DIAGNOSIS — Z8673 Personal history of transient ischemic attack (TIA), and cerebral infarction without residual deficits: Secondary | ICD-10-CM | POA: Insufficient documentation

## 2016-05-26 DIAGNOSIS — I509 Heart failure, unspecified: Secondary | ICD-10-CM | POA: Diagnosis not present

## 2016-05-26 DIAGNOSIS — E785 Hyperlipidemia, unspecified: Secondary | ICD-10-CM | POA: Diagnosis not present

## 2016-05-26 DIAGNOSIS — I2489 Other forms of acute ischemic heart disease: Secondary | ICD-10-CM | POA: Diagnosis present

## 2016-05-26 DIAGNOSIS — G4700 Insomnia, unspecified: Secondary | ICD-10-CM | POA: Insufficient documentation

## 2016-05-26 DIAGNOSIS — I248 Other forms of acute ischemic heart disease: Secondary | ICD-10-CM | POA: Insufficient documentation

## 2016-05-26 DIAGNOSIS — R000 Tachycardia, unspecified: Secondary | ICD-10-CM | POA: Diagnosis not present

## 2016-05-26 DIAGNOSIS — R0602 Shortness of breath: Secondary | ICD-10-CM | POA: Diagnosis present

## 2016-05-26 LAB — CBC
HEMATOCRIT: 38.7 % (ref 36.0–46.0)
HEMOGLOBIN: 12.7 g/dL (ref 12.0–15.0)
MCH: 31.4 pg (ref 26.0–34.0)
MCHC: 32.8 g/dL (ref 30.0–36.0)
MCV: 95.6 fL (ref 78.0–100.0)
Platelets: 122 10*3/uL — ABNORMAL LOW (ref 150–400)
RBC: 4.05 MIL/uL (ref 3.87–5.11)
RDW: 13.2 % (ref 11.5–15.5)
WBC: 5.8 10*3/uL (ref 4.0–10.5)

## 2016-05-26 LAB — BASIC METABOLIC PANEL
ANION GAP: 7 (ref 5–15)
BUN: 21 mg/dL — ABNORMAL HIGH (ref 6–20)
CHLORIDE: 99 mmol/L — AB (ref 101–111)
CO2: 28 mmol/L (ref 22–32)
Calcium: 9.3 mg/dL (ref 8.9–10.3)
Creatinine, Ser: 1.03 mg/dL — ABNORMAL HIGH (ref 0.44–1.00)
GFR calc Af Amer: 56 mL/min — ABNORMAL LOW (ref >60)
GFR, EST NON AFRICAN AMERICAN: 48 mL/min — AB (ref >60)
GLUCOSE: 116 mg/dL — AB (ref 65–99)
POTASSIUM: 4.9 mmol/L (ref 3.5–5.1)
Sodium: 134 mmol/L — ABNORMAL LOW (ref 135–145)

## 2016-05-26 LAB — I-STAT TROPONIN, ED: Troponin i, poc: 0.01 ng/mL (ref 0.00–0.08)

## 2016-05-26 LAB — PROTIME-INR
INR: 1.26 (ref 0.00–1.49)
PROTHROMBIN TIME: 16 s — AB (ref 11.6–15.2)

## 2016-05-26 LAB — BRAIN NATRIURETIC PEPTIDE: B Natriuretic Peptide: 702.6 pg/mL — ABNORMAL HIGH (ref 0.0–100.0)

## 2016-05-26 MED ORDER — TRAMADOL HCL 50 MG PO TABS: 50.0000 mg | ORAL_TABLET | Freq: Two times a day (BID) | ORAL | Status: DC | PRN

## 2016-05-26 MED ORDER — CYCLOSPORINE 0.05 % OP EMUL
1.0000 [drp] | Freq: Two times a day (BID) | OPHTHALMIC | Status: DC
Start: 2016-05-26 — End: 2016-05-27
  Administered 2016-05-26 – 2016-05-27 (×2): 1 [drp] via OPHTHALMIC
  Filled 2016-05-26 (×3): qty 1

## 2016-05-26 MED ORDER — METOPROLOL SUCCINATE ER 25 MG PO TB24
25.0000 mg | ORAL_TABLET | Freq: Every day | ORAL | Status: DC
  Administered 2016-05-27: 25 mg via ORAL
  Filled 2016-05-26: qty 1

## 2016-05-26 MED ORDER — RIVASTIGMINE TARTRATE 1.5 MG PO CAPS
3.0000 mg | ORAL_CAPSULE | Freq: Two times a day (BID) | ORAL | Status: DC
  Administered 2016-05-26: 1.5 mg via ORAL
  Administered 2016-05-27: 3 mg via ORAL
  Filled 2016-05-26 (×3): qty 2

## 2016-05-26 MED ORDER — METOPROLOL TARTRATE 5 MG/5ML IV SOLN
5.0000 mg | Freq: Once | INTRAVENOUS | Status: AC
Start: 2016-05-26 — End: 2016-05-26
  Administered 2016-05-26: 5 mg via INTRAVENOUS
  Filled 2016-05-26: qty 5

## 2016-05-26 MED ORDER — RIVASTIGMINE TARTRATE 3 MG PO CAPS
3.0000 mg | ORAL_CAPSULE | Freq: Two times a day (BID) | ORAL | Status: DC
  Filled 2016-05-26: qty 1

## 2016-05-26 MED ORDER — MELATONIN 3 MG PO TABS
6.0000 mg | ORAL_TABLET | Freq: Every evening | ORAL | Status: DC | PRN
  Filled 2016-05-26: qty 2

## 2016-05-26 MED ORDER — RIVAROXABAN 20 MG PO TABS
20.0000 mg | ORAL_TABLET | Freq: Every day | ORAL | Status: DC
  Administered 2016-05-26: 20 mg via ORAL
  Filled 2016-05-26: qty 1

## 2016-05-26 MED ORDER — FUROSEMIDE 10 MG/ML IJ SOLN
20.0000 mg | Freq: Once | INTRAMUSCULAR | Status: AC
  Administered 2016-05-26: 20 mg via INTRAVENOUS
  Filled 2016-05-26: qty 2

## 2016-05-26 NOTE — ED Triage Notes (Signed)
Pt here and reports shortness of breath, worse with exertion. Pt reports hx of CHF and A-fib. Pt reports she becomes short of breath with walking short distances. Tachypneic but no distress noted.

## 2016-05-26 NOTE — ED Notes (Signed)
Called lab to add on BNP. ?

## 2016-05-26 NOTE — ED Provider Notes (Signed)
[facility] DEPT Provider Note   CSN: [id number] Arrival date & time: 05/26/16  1614  First Provider Contact:  None       History   Chief Complaint Chief Complaint  Patient presents with  . Shortness of Breath    HPI [name] is a 82 y.o. female.  HPI Patient has had increasing shortness of breath for about a week. She reports it was gradual in onset but has gotten much worse over the past 2 days. She states that now even minor activities like going to the bathroom or the kitchen are extremely fatiguing and make her very short of breath. She reports she does feel a slight pressure-like sensation in her chest which she feels is congestion. She does feel that she's had similar symptoms with congestive heart failure in the past. Patient normally takes 20 mg of Lasix a day. She did take 40 mg today but has not noticed an improvement. She reports she has been wheezing and whistling when she breathes. She states that she has not had fever, chills or productive cough. She reports she does have chronic lower extremity swelling. She does not note that it is significantly worse than baseline. She does have chronic atrial fibrillation. Patient reports she has been compliant with regular medications. Past Medical History:  Diagnosis Date  . Arthritis    severe  . Atrial fibrillation ([place])   . Chronic diastolic CHF (congestive heart failure) ([place])   . Chronic insomnia   . Degenerative arthritis   . Essential hypertension   . Hypercholesteremia   . Memory deficit 05/24/2013  . Osteoporosis   . PAF (paroxysmal atrial fibrillation) ([place])    a. Intolerant to amiodarone, discontinued October 2012.  . Sinus bradycardia   . Stroke [facility]) 2007   mini stroke or TIA with aphasia     Patient Active Problem List   Diagnosis Date Noted  . Demand ischemia ([place]) 05/27/2016  . Acute on chronic diastolic congestive heart failure ([place]) 05/26/2016  . Atrial fibrillation with RVR ([place]) 05/26/2016  .  Insomnia 01/22/2016  . Sinus bradycardia   . Chronic diastolic CHF (congestive heart failure) ([place])   . Essential hypertension   . PAF (paroxysmal atrial fibrillation) ([place])   . Stroke ([place])   . Memory deficit 05/24/2013  . Arthritis 10/13/2011  . Long term (current) use of anticoagulants 07/21/2011  . Dyslipidemia 07/10/2011    Past Surgical History:  Procedure Laterality Date  . CATARACT EXTRACTION  2009   ou  . FEMUR FRACTURE SURGERY  8/12  . left knee  2001  . REPLACEMENT TOTAL KNEE BILATERAL Right 1999  . right intertrochanteric hip fracture status post ORIF  August 2012  . right shoulder replacement  2009    OB History    No data available       Home Medications    Prior to Admission medications   Medication Sig Start Date End Date Taking? Authorizing Provider  Cholecalciferol (VITAMIN D-3 PO) Take 1 capsule by mouth daily.   Yes Historical Provider, MD  Cyanocobalamin (VITAMIN B-12 PO) Take 1 tablet by mouth daily.   Yes Historical Provider, MD  Melatonin 3 MG TABS Take 6 mg by mouth at bedtime as needed (for sleep).   Yes Historical Provider, MD  RESTASIS 0.05 % ophthalmic emulsion Place 1 drop into both eyes 2 (two) times daily. 03/24/16  Yes Historical Provider, MD  rivastigmine (EXELON) 3 MG capsule Take 1 capsule (3 mg total) by mouth 2 (  two) times daily. 04/01/16  Yes Kathrynn Ducking, MD  Suvorexant (BELSOMRA) 20 MG TABS Take 1 tablet by mouth at bedtime as needed. 12/31/15  Yes Kathrynn Ducking, MD  traMADol (ULTRAM) 50 MG tablet Take 50 mg by mouth 2 (two) times daily as needed (for pain). Reported on 01/22/2016 12/01/11  Yes Historical Provider, MD  VOLTAREN 1 % GEL Apply 1 application topically daily as needed (for leg pain).  11/09/14  Yes Historical Provider, MD  XARELTO 20 MG TABS tablet Take 1 tablet by mouth nightly 12/25/15  Yes Historical Provider, MD  furosemide (LASIX) 40 MG tablet Take 1 tablet (40 mg total) by mouth daily. 05/27/16   Venetia Maxon Rama, MD    metoprolol succinate (TOPROL-XL) 50 MG 24 hr tablet Take 1 tablet (50 mg total) by mouth daily. Take with or immediately following a meal. 05/28/16   Venetia Maxon Rama, MD  potassium chloride 20 MEQ TBCR Take 10 mEq by mouth daily. 05/27/16   Venetia Maxon Rama, MD    Family History Family History  Problem Relation Age of Onset  . Dementia Mother   . Stroke Mother   . Cancer - Colon Father   . Cancer Father   . Cancer Sister   . Cancer Son   . Cancer Sister   . Coronary artery disease      Social History Social History  Substance Use Topics  . Smoking status: Never Smoker  . Smokeless tobacco: Never Used  . Alcohol use 4.2 oz/week    7 Glasses of wine per week     Comment: 1 glass wine with dinner some days     Allergies   Codeine   Review of Systems Review of Systems  10 Systems reviewed and are negative for acute change except as noted in the HPI.  Physical Exam Updated Vital Signs BP 119/68 (BP Location: Left Arm)   Pulse (!) 120   Temp 98.3 F (36.8 C) (Oral)   Resp 18   Wt 124 lb 3.2 oz (56.3 kg) Comment: scale b  SpO2 93%   BMI 25.09 kg/m   Physical Exam  Constitutional: She is oriented to person, place, and time. She appears well-developed and well-nourished.  Mild increased work of breathing at rest. Patient is alert and nontoxic.  HENT:  Head: Normocephalic and atraumatic.  Mouth/Throat: Oropharynx is clear and moist.  Eyes: EOM are normal. Pupils are equal, round, and reactive to light.  Neck: Neck supple.  Cardiovascular:  Tachycardia. Irregularly irregular. Distal pulses intact.  Pulmonary/Chest:  Mild increased work of breathing. Rales present at right base. Slightly diminished airflow to left base. No gross wheeze or rhonchi.  Abdominal: Soft. She exhibits no distension. There is no tenderness.  Musculoskeletal: She exhibits edema.  Patient has 2+ pitting edema bilateral lower extremities. Calves are soft and nontender.  Neurological: She is  alert and oriented to person, place, and time. She exhibits normal muscle tone. Coordination normal.  Skin: Skin is warm and dry.  Psychiatric: She has a normal mood and affect.     ED Treatments / Results  Labs (all labs ordered are listed, but only abnormal results are displayed) Labs Reviewed  BASIC METABOLIC PANEL - Abnormal; Notable for the following:       Result Value   Sodium 134 (*)    Chloride 99 (*)    Glucose, Bld 116 (*)    BUN 21 (*)    Creatinine, Ser 1.03 (*)  GFR calc non Af Amer 48 (*)    GFR calc Af Amer 56 (*)    All other components within normal limits  CBC - Abnormal; Notable for the following:    Platelets 122 (*)    All other components within normal limits  PROTIME-INR - Abnormal; Notable for the following:    Prothrombin Time 16.0 (*)    All other components within normal limits  BRAIN NATRIURETIC PEPTIDE - Abnormal; Notable for the following:    B Natriuretic Peptide 702.6 (*)    All other components within normal limits  CBC - Abnormal; Notable for the following:    Platelets 104 (*)    All other components within normal limits  COMPREHENSIVE METABOLIC PANEL - Abnormal; Notable for the following:    Sodium 134 (*)    Chloride 99 (*)    Glucose, Bld 105 (*)    BUN 22 (*)    Calcium 8.8 (*)    Total Protein 5.7 (*)    AST 46 (*)    ALT 61 (*)    GFR calc non Af Amer 54 (*)    All other components within normal limits  TROPONIN I - Abnormal; Notable for the following:    Troponin I 0.05 (*)    All other components within normal limits  TROPONIN I - Abnormal; Notable for the following:    Troponin I 0.04 (*)    All other components within normal limits  I-STAT TROPOININ, ED    EKG  EKG Interpretation  Date/Time:  Tuesday May 26 2016 16:23:26 EDT Ventricular Rate:  130 PR Interval:    QRS Duration: 68 QT Interval:  304 QTC Calculation: 447 R Axis:   23 Text Interpretation:  Atrial fibrillation with rapid ventricular response  with premature ventricular or aberrantly conducted complexes Nonspecific ST abnormality Abnormal ECG Confirmed by Johnney Killian, MD, Jeannie Done [phone]) on 05/26/2016 6:30:24 PM       Radiology No results found.  Procedures Procedures (including critical care time) CRITICAL CARE Performed by: Charlesetta Shanks   Total critical care time: 30  minutes  Critical care time was exclusive of separately billable procedures and treating other patients.  Critical care was necessary to treat or prevent imminent or life-threatening deterioration.  Critical care was time spent personally by me on the following activities: development of treatment plan with patient and/or surrogate as well as nursing, discussions with consultants, evaluation of patient's response to treatment, examination of patient, obtaining history from patient or surrogate, ordering and performing treatments and interventions, ordering and review of laboratory studies, ordering and review of radiographic studies, pulse oximetry and re-evaluation of patient's condition.  Medications Ordered in ED Medications  furosemide (LASIX) injection 20 mg (20 mg Intravenous Given 05/26/16 1900)  metoprolol (LOPRESSOR) injection 5 mg (5 mg Intravenous Given 05/26/16 1900)  metoprolol (LOPRESSOR) injection 2.5 mg (2.5 mg Intravenous Given 05/27/16 1213)  metoprolol succinate (TOPROL-XL) 24 hr tablet 25 mg (25 mg Oral Given 05/27/16 1430)     Initial Impression / Assessment and Plan / ED Course  I have reviewed the triage vital signs and the nursing notes.  Pertinent labs & imaging results that were available during my care of the patient were reviewed by me and considered in my medical decision making (see chart for details).  Clinical Course      Final Clinical Impressions(s) / ED Diagnoses   Final diagnoses:  Acute on chronic congestive heart failure, unspecified congestive heart failure type [facility])  Atrial fibrillation  with rapid ventricular  response [facility])   Patient presents with increasing dyspnea. Findings are positive for CHF exacerbation as well as atrial fibrillation with rapid ventricular response. Was treated in emergency department with Lasix and metoprolol. Plan will be for admission. New Prescriptions Discharge Medication List as of 05/27/2016  4:33 PM    START taking these medications   Details  potassium chloride 20 MEQ TBCR Take 10 mEq by mouth daily., Starting Wed 05/27/2016, Normal         Charlesetta Shanks, MD 06/02/16 1623

## 2016-05-26 NOTE — H&P (Signed)
History and Physical    [name] [id number] DOB: 06-09-1928 DOA: 05/26/2016  PCP: Cristopher Peru, MD   Patient coming from: Home.  Chief Complaint: Shortness of breath.  HPI: LOUIDA COCH is a 82 y.o. female with medical history significant of chronic atrial fibrillation, chronic diastolic CHF, chronic insomnia, osteoarthritis, essential hypertension, hyperlipidemia, osteoporosis, history of CVA who comes in the emergency department with progressively worse dyspnea for about a week, but particularly worse over the last 2 days.  Per patient, she has been noticing increased fatigue, dyspnea with mild pressure-like discomfort in her chest when she walks inside her home from one room to another. She denies dizziness, diaphoresis, nausea, PND, but complains of dyspnea and worsening lower extremity edema. She complains also of dry cough and wheezing, denies fever, chills, productive cough, sore throat, abdominal pain, diarrhea, constipation or GU symptoms.  ED Course: The patient's heart rate was initially 130 bpm on 12-lead EKG, but this subsided after she was given metoprolol. Workup shows an elevated BNP level of 702 pg/L, sodium of 134 mmol per liter, mild elevation of BUN and creatinine. Her chest radiograph shows vascular congestion.  Review of Systems: As per HPI otherwise 10 point review of systems negative.   Past Medical History:  Diagnosis Date  . Arthritis    severe  . Atrial fibrillation ([place])   . Chronic diastolic CHF (congestive heart failure) ([place])   . Chronic insomnia   . Degenerative arthritis   . Essential hypertension   . Hypercholesteremia   . Memory deficit 05/24/2013  . Osteoporosis   . PAF (paroxysmal atrial fibrillation) ([place])    a. Intolerant to amiodarone, discontinued October 2012.  . Sinus bradycardia   . Stroke [facility]) 2007   mini stroke or TIA with aphasia     Past Surgical History:  Procedure Laterality Date  . CATARACT EXTRACTION  2009   ou  . FEMUR  FRACTURE SURGERY  8/12  . left knee  2001  . REPLACEMENT TOTAL KNEE BILATERAL Right 1999  . right intertrochanteric hip fracture status post ORIF  August 2012  . right shoulder replacement  2009     reports that she has never smoked. She has never used smokeless tobacco. She reports that she drinks about 4.2 oz of alcohol per week . She reports that she does not use drugs.  Allergies  Allergen Reactions  . Codeine Nausea Only    Family History  Problem Relation Age of Onset  . Dementia Mother   . Stroke Mother   . Cancer - Colon Father   . Cancer Father   . Cancer Sister   . Cancer Son   . Cancer Sister   . Coronary artery disease      Prior to Admission medications   Medication Sig Start Date End Date Taking? Authorizing Provider  Cholecalciferol (VITAMIN D-3 PO) Take 1 capsule by mouth daily.   Yes Historical Provider, MD  Cyanocobalamin (VITAMIN B-12 PO) Take 1 tablet by mouth daily.   Yes Historical Provider, MD  furosemide (LASIX) 40 MG tablet Take 1 tablet (40 mg total) by mouth daily. Patient taking differently: Take 20-40 mg by mouth daily.  11/08/15  Yes Evans Lance, MD  Melatonin 3 MG TABS Take 6 mg by mouth at bedtime as needed (for sleep).   Yes Historical Provider, MD  metoprolol succinate (TOPROL-XL) 25 MG 24 hr tablet TAKE 1 TABLET BY MOUTH DAILY 03/09/16  Yes Burtis Junes, NP  RESTASIS  0.05 % ophthalmic emulsion Place 1 drop into both eyes 2 (two) times daily. 03/24/16  Yes Historical Provider, MD  rivastigmine (EXELON) 3 MG capsule Take 1 capsule (3 mg total) by mouth 2 (two) times daily. 04/01/16  Yes Kathrynn Ducking, MD  Suvorexant (BELSOMRA) 20 MG TABS Take 1 tablet by mouth at bedtime as needed. 12/31/15  Yes Kathrynn Ducking, MD  traMADol (ULTRAM) 50 MG tablet Take 50 mg by mouth 2 (two) times daily as needed (for pain). Reported on 01/22/2016 12/01/11  Yes Historical Provider, MD  VOLTAREN 1 % GEL Apply 1 application topically daily as needed (for leg pain).   11/09/14  Yes Historical Provider, MD  XARELTO 20 MG TABS tablet Take 1 tablet by mouth nightly 12/25/15  Yes Historical Provider, MD  gabapentin (NEURONTIN) 300 MG capsule TAKE 1 CAPSULE(300 MG) BY MOUTH AT BEDTIME Patient not taking: Reported on 05/26/2016 02/05/16   Wallene Huh, DPM  metoprolol succinate (TOPROL-XL) 25 MG 24 hr tablet TAKE 1 TABLET BY MOUTH DAILY Patient not taking: Reported on 05/26/2016 02/05/16   Burtis Junes, NP    Physical Exam: Vitals:   05/26/16 1845 05/26/16 1900 05/26/16 1930 05/26/16 1945  BP: (!) 147/116 155/68 130/94 141/76  Pulse: 84 79  73  Resp: (!) 27 22 19 19   Temp:      TempSrc:      SpO2: 100% 98% 98% 97%  Weight:          Constitutional: NAD, calm, comfortable Vitals:   05/26/16 1845 05/26/16 1900 05/26/16 1930 05/26/16 1945  BP: (!) 147/116 155/68 130/94 141/76  Pulse: 84 79  73  Resp: (!) 27 22 19 19   Temp:      TempSrc:      SpO2: 100% 98% 98% 97%  Weight:       Eyes: PERRL, lids and conjunctivae normal ENMT: Mucous membranes are moist. Posterior pharynx clear of any exudate or lesions. Neck: normal, supple, no masses, no thyromegaly Respiratory: Bibasilar rales. No wheezing or rhonchi. No accessory muscle use.  Cardiovascular: Irregular irregular with a normal rate, no murmurs / rubs / gallops. 2+ extremity edema. 2+ pedal pulses. No carotid bruits.  Abdomen: no tenderness, no masses palpated. No hepatosplenomegaly. Bowel sounds positive.  Musculoskeletal: no clubbing / cyanosis.  Good ROM, no contractures. Normal muscle tone.  Skin: no rashes, lesions, ulcers on limited skin exam. Neurologic: CN 2-12 grossly intact. Sensation intact, DTR normal. Strength 5/5 in all 4.  Psychiatric: Normal judgment and insight. Alert and oriented x 4. Normal mood.     Labs on Admission: I have personally reviewed following labs and imaging studies  CBC:  Recent Labs Lab 05/26/16 1425  WBC 5.8  HGB 12.7  HCT 38.7  MCV 95.6  PLT 122*    Basic Metabolic Panel:  Recent Labs Lab 05/26/16 1425  NA 134*  K 4.9  CL 99*  CO2 28  GLUCOSE 116*  BUN 21*  CREATININE 1.03*  CALCIUM 9.3   GFR: Estimated Creatinine Clearance: 31 mL/min (by C-G formula based on SCr of 1.03 mg/dL).  Coagulation Profile:  Recent Labs Lab 05/26/16 1425  INR 1.26   Urine analysis:    Component Value Date/Time   COLORURINE YELLOW 08/16/2011 0227   APPEARANCEUR CLOUDY (A) 08/16/2011 0227   LABSPEC 1.018 08/16/2011 0227   PHURINE 5.5 08/16/2011 0227   GLUCOSEU NEGATIVE 08/16/2011 0227   HGBUR SMALL (A) 08/16/2011 0227   BILIRUBINUR NEGATIVE 08/16/2011 0227  KETONESUR 15 (A) 08/16/2011 0227   PROTEINUR NEGATIVE 08/16/2011 0227   UROBILINOGEN 0.2 08/16/2011 0227   NITRITE POSITIVE (A) 08/16/2011 0227   LEUKOCYTESUR MODERATE (A) 08/16/2011 0227    Radiological Exams on Admission: Dg Chest 2 View  Result Date: 05/26/2016 CLINICAL DATA:  Wheezing and shortness of breath, 2-3 days duration. EXAM: CHEST  2 VIEW COMPARISON:  11/26/2015.  09/17/2011.  CT study 08/16/2011. FINDINGS: Heart size is normal. Aortic atherosclerosis. There is abnormal interstitial density most consistent with interstitial edema. No alveolar edema, collapse or effusion. I think there is a 1 cm pulmonary nodule in the right middle lobe not seen previously. I considered if this could be due to a healed rib fracture on the right, but there does seem to be nodular shadow on the lateral view is well. Old thoracic compression fractures appear the same as seen previously. IMPRESSION: Diffuse interstitial prominence suggesting interstitial edema. Aortic atherosclerosis. Question 1 cm nodule right middle lobe. CT recommended at some point to evaluate this further. Electronically Signed   By: Nelson Chimes M.D.   On: 05/26/2016 17:16  Echocardiogram 03/26/2015  LV EF: 55% -   60%  ------------------------------------------------------------------- Indications:      Shortness  of breath (R06.02).  ------------------------------------------------------------------- History:   PMH:   Atrial fibrillation.  Congestive heart failure. Stroke.  Risk factors:  Bradycardia. Arthritis. Hypertension. Dyslipidemia.  ------------------------------------------------------------------- Study Conclusions  - Left ventricle: The cavity size was normal. Wall thickness was   increased in a pattern of mild LVH. Systolic function was normal.   The estimated ejection fraction was in the range of 55% to 60%.   Doppler parameters are consistent with abnormal left ventricular   relaxation (grade 1 diastolic dysfunction). - Aortic valve: There was mild regurgitation. - Mitral valve: Calcified annulus. Mildly thickened leaflets .   There was mild regurgitation. - Atrial septum: No defect or patent foramen ovale was identified. - Pulmonary arteries: PA peak pressure: 35 mm Hg (S).  EKG: Independently reviewed. Vent. rate 130 BPM PR interval * ms QRS duration 68 ms QT/QTc 304/447 ms P-R-T axes * 23 67 Atrial fibrillation with rapid ventricular response with premature ventricular or aberrantly conducted complexes Nonspecific ST abnormality Abnormal ECG  Assessment/Plan Principal Problem:   Acute on chronic diastolic congestive heart failure (HCC) Admit to telemetry/observation. Continue supplemental oxygen. Trend troponin levels. Continue IV furosemide. Monitor renal function and electrolyte levels.  Supplementing electrolytes as needed. Check echocardiogram in the morning.  Active Problems:  Atrial fibrillation with RVR (HCC) CHA2D2VASC score of 8. Continue Xarelto for stroke prevention. Continue metoprolol for rate control.   Long term (current) use of anticoagulants Continue Xarelto for stroke prevention.    Dyslipidemia Continue lifestyle modifications.    Essential hypertension Continue metoprolol 25 mg by mouth daily. Continue furosemide. Monitor blood  pressure.      DVT prophylaxis: On Xarelto. Code Status: Full code. Family Communication:  Disposition Plan: Admit for diuresis with IV furosemide, ventricular rate control and troponin                                         level cycling. Repeat echocardiogram in a.m. Consults called:  Admission status: Observation/telemetry   Reubin Milan MD [facility] Pager [phone].  If 7PM-7AM, please contact night-coverage [email or web address] Password [facility]  05/26/2016, 10:51 PM

## 2016-05-26 NOTE — ED Notes (Signed)
Pt demanding to have a catheter placed because she was given lasix, informed pt that catheter would not be placed because pt is ambulatory to bedside commode without assistance.

## 2016-05-26 NOTE — ED Notes (Signed)
Bedside commode placed at bedside for patient to use. Pt give call light and instructed to use when she has to urinate

## 2016-05-26 NOTE — ED Notes (Signed)
Patient transported to X-ray 

## 2016-05-27 ENCOUNTER — Other Ambulatory Visit (HOSPITAL_COMMUNITY): Payer: PRIVATE HEALTH INSURANCE

## 2016-05-27 DIAGNOSIS — Z96611 Presence of right artificial shoulder joint: Secondary | ICD-10-CM | POA: Diagnosis not present

## 2016-05-27 DIAGNOSIS — G4700 Insomnia, unspecified: Secondary | ICD-10-CM | POA: Diagnosis not present

## 2016-05-27 DIAGNOSIS — I110 Hypertensive heart disease with heart failure: Secondary | ICD-10-CM | POA: Diagnosis not present

## 2016-05-27 DIAGNOSIS — I5033 Acute on chronic diastolic (congestive) heart failure: Secondary | ICD-10-CM | POA: Diagnosis not present

## 2016-05-27 DIAGNOSIS — Z79899 Other long term (current) drug therapy: Secondary | ICD-10-CM | POA: Diagnosis not present

## 2016-05-27 DIAGNOSIS — Z7901 Long term (current) use of anticoagulants: Secondary | ICD-10-CM | POA: Diagnosis not present

## 2016-05-27 DIAGNOSIS — I248 Other forms of acute ischemic heart disease: Secondary | ICD-10-CM | POA: Diagnosis present

## 2016-05-27 DIAGNOSIS — I482 Chronic atrial fibrillation: Secondary | ICD-10-CM | POA: Diagnosis not present

## 2016-05-27 DIAGNOSIS — E785 Hyperlipidemia, unspecified: Secondary | ICD-10-CM | POA: Diagnosis not present

## 2016-05-27 DIAGNOSIS — Z96653 Presence of artificial knee joint, bilateral: Secondary | ICD-10-CM | POA: Diagnosis not present

## 2016-05-27 DIAGNOSIS — Z8673 Personal history of transient ischemic attack (TIA), and cerebral infarction without residual deficits: Secondary | ICD-10-CM | POA: Diagnosis not present

## 2016-05-27 LAB — COMPREHENSIVE METABOLIC PANEL
ALBUMIN: 3.6 g/dL (ref 3.5–5.0)
ALK PHOS: 48 U/L (ref 38–126)
ALT: 61 U/L — AB (ref 14–54)
ANION GAP: 7 (ref 5–15)
AST: 46 U/L — ABNORMAL HIGH (ref 15–41)
BUN: 22 mg/dL — ABNORMAL HIGH (ref 6–20)
CALCIUM: 8.8 mg/dL — AB (ref 8.9–10.3)
CHLORIDE: 99 mmol/L — AB (ref 101–111)
CO2: 28 mmol/L (ref 22–32)
Creatinine, Ser: 0.94 mg/dL (ref 0.44–1.00)
GFR calc Af Amer: >60 mL/min (ref >60)
GFR calc non Af Amer: 54 mL/min — ABNORMAL LOW (ref >60)
GLUCOSE: 105 mg/dL — AB (ref 65–99)
Potassium: 4 mmol/L (ref 3.5–5.1)
SODIUM: 134 mmol/L — AB (ref 135–145)
Total Bilirubin: 0.8 mg/dL (ref 0.3–1.2)
Total Protein: 5.7 g/dL — ABNORMAL LOW (ref 6.5–8.1)

## 2016-05-27 LAB — CBC
HCT: 36.9 % (ref 36.0–46.0)
HEMOGLOBIN: 12 g/dL (ref 12.0–15.0)
MCH: 30.8 pg (ref 26.0–34.0)
MCHC: 32.5 g/dL (ref 30.0–36.0)
MCV: 94.9 fL (ref 78.0–100.0)
PLATELETS: 104 10*3/uL — AB (ref 150–400)
RBC: 3.89 MIL/uL (ref 3.87–5.11)
RDW: 13.4 % (ref 11.5–15.5)
WBC: 5.1 10*3/uL (ref 4.0–10.5)

## 2016-05-27 LAB — TROPONIN I
Troponin I: 0.05 ng/mL (ref <0.03)
Troponin I: 0.04 ng/mL (ref <0.03)

## 2016-05-27 MED ORDER — METOPROLOL SUCCINATE ER 25 MG PO TB24
25.0000 mg | ORAL_TABLET | Freq: Once | ORAL | Status: AC
  Administered 2016-05-27: 25 mg via ORAL
  Filled 2016-05-27: qty 1

## 2016-05-27 MED ORDER — METOPROLOL SUCCINATE ER 50 MG PO TB24: 50.0000 mg | ORAL_TABLET | Freq: Every day | ORAL | 3 refills | Status: DC

## 2016-05-27 MED ORDER — FUROSEMIDE 10 MG/ML IJ SOLN
20.0000 mg | Freq: Two times a day (BID) | INTRAMUSCULAR | Status: DC
  Administered 2016-05-27: 20 mg via INTRAVENOUS
  Filled 2016-05-27: qty 2

## 2016-05-27 MED ORDER — METOPROLOL TARTRATE 5 MG/5ML IV SOLN
2.5000 mg | Freq: Once | INTRAVENOUS | Status: AC
  Administered 2016-05-27: 2.5 mg via INTRAVENOUS
  Filled 2016-05-27: qty 5

## 2016-05-27 MED ORDER — RIVAROXABAN 15 MG PO TABS
15.0000 mg | ORAL_TABLET | Freq: Every day | ORAL | Status: DC
  Administered 2016-05-27: 15 mg via ORAL
  Filled 2016-05-27: qty 1

## 2016-05-27 MED ORDER — POTASSIUM CHLORIDE ER 20 MEQ PO TBCR: 10.0000 meq | EXTENDED_RELEASE_TABLET | Freq: Every day | ORAL | 3 refills | Status: DC

## 2016-05-27 MED ORDER — METOPROLOL SUCCINATE ER 50 MG PO TB24: 50.0000 mg | ORAL_TABLET | Freq: Every day | ORAL | Status: DC

## 2016-05-27 MED ORDER — FUROSEMIDE 40 MG PO TABS: 40.0000 mg | ORAL_TABLET | Freq: Every day | ORAL | 3 refills | Status: DC

## 2016-05-27 NOTE — Progress Notes (Signed)
Progress Note    [name]  [id number] DOB: [birth year]  DOA: 05/26/2016 PCP: Cristopher Peru, MD    Brief Narrative:   [name] is an 83 y.o. female with medical history significant of chronic atrial fibrillation, chronic diastolic CHF, chronic insomnia, osteoarthritis, essential hypertension, hyperlipidemia, osteoporosis, history of CVA who was admitted 05/26/16 with a chief complaint of shortness of breath and chest pain. The patient's heart rate was initially 130 bpm on 12-lead EKG, but this subsided after she was given metoprolol. Workup shows an elevated BNP level of 702 pg/L, sodium of 134 mmol per liter, mild elevation of BUN and creatinine. Her chest radiograph shows vascular congestion.  Assessment/Plan:   Principal Problem:   Acute on chronic diastolic congestive heart failure (HCC)/Demand ischemia Patient has diuresed well with Lasix overnight. She is no longer short of breath. 2-D echo pending. Last echocardiogram was done 03/26/15. EF was 55-60 percent at that time.  Active Problems:   Dyslipidemia Continue lifestyle modification.    Essential hypertension Continue metoprolol, furosemide.    Atrial fibrillation with RVR (HCC)/long-term use of anticoagulants Needs better rate control. Will increase metoprolol to 50 mg daily. We'll give 2.5 mg of IV metoprolol now. Continue Xarelto.    Family Communication/Anticipated D/C date and plan/Code Status   DVT prophylaxis: Xarelto ordered. Code Status: Full Code.  Family Communication: No family currently at the bedside. Disposition Plan: Home later today or tomorrow depending on whether or not echo can get done.   Medical Consultants:    None.   Procedures:    Anti-Infectives:   Anti-infectives    None      Subjective:    [name] feels better with diuresis. No longer short of breath. No complaints of chest pain.  Objective:    Vitals:   05/26/16 1945 05/26/16 2045 05/26/16 2201 05/27/16 0458    BP: 141/76 (!) 142/93  (!) 112/95  Pulse: 73 (!) 103  (!) 102  Resp: 19 18  18   Temp:  98.5 F (36.9 C)  98.4 F (36.9 C)  TempSrc:   Oral Oral  SpO2: 97% 97%  98%  Weight:    56.3 kg (124 lb 3.2 oz)    Intake/Output Summary (Last 24 hours) at 05/27/16 0827 Last data filed at 05/27/16 0501  Gross per 24 hour  Intake                0 ml  Output              925 ml  Net             -925 ml   Filed Weights   05/26/16 1637 05/27/16 0458  Weight: 58.1 kg (128 lb) 56.3 kg (124 lb 3.2 oz)    Exam: General exam: Appears calm and comfortable.  Respiratory system: A few bibasilar rales. Respiratory effort normal. Cardiovascular system: Heart sounds irregularly irregular/tachycardic. No JVD,  rubs, gallops or clicks. No murmurs. Gastrointestinal system: Abdomen is nondistended, soft and nontender. No organomegaly or masses felt. Normal bowel sounds heard. Central nervous system: Alert and oriented. No focal neurological deficits. Extremities: No clubbing, edema, or cyanosis. Skin: No rashes, lesions or ulcers Psychiatry: Judgement and insight appear normal. Mood & affect appropriate.   Data Reviewed:   I have personally reviewed following labs and imaging studies:  Labs: Basic Metabolic Panel:  Recent Labs Lab 05/26/16 1425 05/27/16 0534  NA 134* 134*  K 4.9 4.0  CL  99* 99*  CO2 28 28  GLUCOSE 116* 105*  BUN 21* 22*  CREATININE 1.03* 0.94  CALCIUM 9.3 8.8*   GFR Estimated Creatinine Clearance: 33.4 mL/min (by C-G formula based on SCr of 0.94 mg/dL). Liver Function Tests:  Recent Labs Lab 05/27/16 0534  AST 46*  ALT 61*  ALKPHOS 48  BILITOT 0.8  PROT 5.7*  ALBUMIN 3.6   No results for input(s): LIPASE, AMYLASE in the last 168 hours. No results for input(s): AMMONIA in the last 168 hours. Coagulation profile  Recent Labs Lab 05/26/16 1425  INR 1.26    CBC:  Recent Labs Lab 05/26/16 1425 05/27/16 0534  WBC 5.8 5.1  HGB 12.7 12.0  HCT 38.7 36.9   MCV 95.6 94.9  PLT 122* 104*   Cardiac Enzymes:  Recent Labs Lab 05/26/16 2246 05/27/16 0534  TROPONINI 0.05* 0.04*   Urine analysis:    Component Value Date/Time   COLORURINE YELLOW 08/16/2011 0227   APPEARANCEUR CLOUDY (A) 08/16/2011 0227   LABSPEC 1.018 08/16/2011 0227   PHURINE 5.5 08/16/2011 0227   GLUCOSEU NEGATIVE 08/16/2011 0227   HGBUR SMALL (A) 08/16/2011 0227   BILIRUBINUR NEGATIVE 08/16/2011 0227   KETONESUR 15 (A) 08/16/2011 0227   PROTEINUR NEGATIVE 08/16/2011 0227   UROBILINOGEN 0.2 08/16/2011 0227   NITRITE POSITIVE (A) 08/16/2011 0227   LEUKOCYTESUR MODERATE (A) 08/16/2011 0227   Microbiology No results found for this or any previous visit (from the past 240 hour(s)).  Radiology: Dg Chest 2 View  Result Date: 05/26/2016 CLINICAL DATA:  Wheezing and shortness of breath, 2-3 days duration. EXAM: CHEST  2 VIEW COMPARISON:  11/26/2015.  09/17/2011.  CT study 08/16/2011. FINDINGS: Heart size is normal. Aortic atherosclerosis. There is abnormal interstitial density most consistent with interstitial edema. No alveolar edema, collapse or effusion. I think there is a 1 cm pulmonary nodule in the right middle lobe not seen previously. I considered if this could be due to a healed rib fracture on the right, but there does seem to be nodular shadow on the lateral view is well. Old thoracic compression fractures appear the same as seen previously. IMPRESSION: Diffuse interstitial prominence suggesting interstitial edema. Aortic atherosclerosis. Question 1 cm nodule right middle lobe. CT recommended at some point to evaluate this further. Electronically Signed   By: Nelson Chimes M.D.   On: 05/26/2016 17:16   Medications:   . cycloSPORINE  1 drop Both Eyes BID  . furosemide  20 mg Intravenous BID  . metoprolol succinate  25 mg Oral Daily  . rivaroxaban  20 mg Oral QHS  . rivastigmine  3 mg Oral BID   Continuous Infusions:   Time spent: 25 minutes.   LOS: 0 days    Macon Lesesne  [facility] Pager [phone]. If unable to reach me by pager, please call my cell phone at [phone].  *Please refer to [email or web address], password [email or web address] to get updated schedule on who will round on this patient, as hospitalists switch teams weekly. If 7PM-7AM, please contact night-coverage at [email or web address], password [email or web address] for any overnight needs.  05/27/2016, 8:27 AM

## 2016-05-27 NOTE — Progress Notes (Signed)
Pt has orders to be discharged. Discharge instructions given and pt has no additional questions at this time. Medication regimen reviewed and pt educated. Pt verbalized understanding and has no additional questions. Telemetry box removed. IV removed and site in good condition. Pt stable and waiting for transportation.  Camry Robello RN 

## 2016-05-27 NOTE — Care Management Note (Addendum)
Case Management Note  Patient Details  Name: [name] MRN: [id number] Date of Birth: 02-20-1928  Subjective/Objective:         Admitted to Observation for CHF           Action/Plan: Patient lives alone, her son lives over an hour away; she has Pharmacologist monitoring system. She is working on getting [facility] to assist in her care privately. She also have a cleaning service that comes every 2 weeks; DME - cane and walker. Patient would benefit from a Disease Management Program for CHF; [place] choice offered, pt chose [facility]. Butch Penny with [facility] called for arrangements. Referral made to [facility] CHF program. Patient stated that she did not want to be followed by the HF clinic at this time.  Expected Discharge Date:  05/27/16               Expected Discharge Plan:  [place]  In-House Referral:   [facility]  Discharge planning Services  CM Consult  Post Acute Care Choice:    Choice offered to:  Patient  HH Arranged:  RN, Disease Management [place] Agency:  [place]  Status of Service:  In process, will continue to follow  Sherrilyn Rist [id number] 05/27/2016, 3:16 PM

## 2016-05-27 NOTE — Discharge Summary (Signed)
Physician Discharge Summary  [name] [id number] DOB: February 12, 1928 DOA: 05/26/2016  PCP: Cristopher Peru, MD  Admit date: 05/26/2016 Discharge date: 05/27/2016  Admitted From: Home Discharge disposition: Home with home health services.   Recommendations for Outpatient Follow-Up:   1. Recommend follow-up with the heart failure clinic within 1 week post discharge.  Discharge Diagnosis:   Principal Problem:    Acute on chronic diastolic congestive heart failure (HCC) Active Problems:    Dyslipidemia    Long term (current) use of anticoagulants    Essential hypertension    Atrial fibrillation with RVR ([place])    Demand ischemia   Discharge Condition: Improved.  Diet recommendation: Low sodium, heart healthy.    History of Present Illness:   [name] is an 82 y.o. female with medical history significant of chronic atrial fibrillation, chronic diastolic CHF, chronic insomnia, osteoarthritis, essential hypertension, hyperlipidemia, osteoporosis, history of CVA who was admitted 05/26/16 with a chief complaint of shortness of breath and chest pain. The patient's heart rate was initially 130 bpm on 12-lead EKG, but this subsided after she was given metoprolol. Workup shows an elevated BNP level of 702 pg/L, sodium of 134 mmol per liter, mild elevation of BUN and creatinine. Her chest radiograph shows vascular congestion.  Hospital Course by Problem:   Principal Problem:   Acute on chronic diastolic congestive heart failure (HCC)/Demand ischemia Patient has diuresed well with Lasix overnight. She is no longer short of breath. 2-D echo can be done as an outpatient. Last echocardiogram was done 03/26/15. EF was 55-60 percent at that time. Patient was instructed to weigh herself daily. She was discharged on 40 mg of Lasix daily (prior maintenance dose was 20 mg daily). The heart failure home health team will see her for education regarding medical management of heart failure. She should  have a BMET done in 2 days and then weekly thereafter. I felt the patient needed an additional 24 hours in the hospital, but she was adamant that she could not spend another night in the hospital and requested discharge.  Active Problems:   Dyslipidemia Continue lifestyle modification.    Essential hypertension Continue metoprolol, furosemide.    Atrial fibrillation with RVR (HCC)/long-term use of anticoagulants Needs better rate control. Will increase metoprolol to 50 mg daily. Continue Xarelto.   Medical Consultants:    None.   Discharge Exam:   Vitals:   05/27/16 0853 05/27/16 1208  BP: (!) 151/72 119/68  Pulse: 97 (!) 120  Resp:    Temp: 98 F (36.7 C) 98.3 F (36.8 C)   Vitals:   05/26/16 2201 05/27/16 0458 05/27/16 0853 05/27/16 1208  BP:  (!) 112/95 (!) 151/72 119/68  Pulse:  (!) 102 97 (!) 120  Resp:  18    Temp:  98.4 F (36.9 C) 98 F (36.7 C) 98.3 F (36.8 C)  TempSrc: Oral Oral Oral Oral  SpO2:  98% 95% 93%  Weight:  56.3 kg (124 lb 3.2 oz)     General exam: Appears calm and comfortable.  Respiratory system: A few bibasilar rales. Respiratory effort normal. Cardiovascular system: Heart sounds irregularly irregular/tachycardic. No JVD,  rubs, gallops or clicks. No murmurs. Gastrointestinal system: Abdomen is nondistended, soft and nontender. No organomegaly or masses felt. Normal bowel sounds heard. Central nervous system: Alert and oriented. No focal neurological deficits. Extremities: No clubbing, edema, or cyanosis. Skin: No rashes, lesions or ulcers Psychiatry: Judgement and insight appear normal. Mood & affect appropriate.  The results of significant diagnostics from this hospitalization (including imaging, microbiology, ancillary and laboratory) are listed below for reference.     Procedures and Diagnostic Studies:   Dg Chest 2 View  Result Date: 05/26/2016 CLINICAL DATA:  Wheezing and shortness of breath, 2-3 days duration. EXAM:  CHEST  2 VIEW COMPARISON:  11/26/2015.  09/17/2011.  CT study 08/16/2011. FINDINGS: Heart size is normal. Aortic atherosclerosis. There is abnormal interstitial density most consistent with interstitial edema. No alveolar edema, collapse or effusion. I think there is a 1 cm pulmonary nodule in the right middle lobe not seen previously. I considered if this could be due to a healed rib fracture on the right, but there does seem to be nodular shadow on the lateral view is well. Old thoracic compression fractures appear the same as seen previously. IMPRESSION: Diffuse interstitial prominence suggesting interstitial edema. Aortic atherosclerosis. Question 1 cm nodule right middle lobe. CT recommended at some point to evaluate this further. Electronically Signed   By: Nelson Chimes M.D.   On: 05/26/2016 17:16    Labs:   Basic Metabolic Panel:  Recent Labs Lab 05/26/16 1425 05/27/16 0534  NA 134* 134*  K 4.9 4.0  CL 99* 99*  CO2 28 28  GLUCOSE 116* 105*  BUN 21* 22*  CREATININE 1.03* 0.94  CALCIUM 9.3 8.8*   GFR Estimated Creatinine Clearance: 33.4 mL/min (by C-G formula based on SCr of 0.94 mg/dL). Liver Function Tests:  Recent Labs Lab 05/27/16 0534  AST 46*  ALT 61*  ALKPHOS 48  BILITOT 0.8  PROT 5.7*  ALBUMIN 3.6   Coagulation profile  Recent Labs Lab 05/26/16 1425  INR 1.26    CBC:  Recent Labs Lab 05/26/16 1425 05/27/16 0534  WBC 5.8 5.1  HGB 12.7 12.0  HCT 38.7 36.9  MCV 95.6 94.9  PLT 122* 104*   Cardiac Enzymes:  Recent Labs Lab 05/26/16 2246 05/27/16 0534  TROPONINI 0.05* 0.04*     Discharge Instructions:   Discharge Instructions    (HEART FAILURE PATIENTS) Call MD:  Anytime you have any of the following symptoms: 1) 3 pound weight gain in 24 hours or 5 pounds in 1 week 2) shortness of breath, with or without a dry hacking cough 3) swelling in the hands, feet or stomach 4) if you have to sleep on extra pillows at night in order to breathe.     Complete by:  As directed   Call MD for:  extreme fatigue    Complete by:  As directed   Call MD for:  persistant dizziness or light-headedness    Complete by:  As directed   Diet - low sodium heart healthy    Complete by:  As directed   Face-to-face encounter (required for Medicare/Medicaid patients)    Complete by:  As directed   I Ethelreda Sukhu certify that this patient is under my care and that I, or a nurse practitioner or physician's assistant working with me, had a face-to-face encounter that meets the physician face-to-face encounter requirements with this patient on 05/27/2016. The encounter with the patient was in whole, or in part for the following medical condition(s) which is the primary reason for home health care (List medical condition): CHF exacerbation, needs scale, instruction on how to manage CHF, needs BMET in 2 days, call results to PCP Dr. Felipa Eth.   The encounter with the patient was in whole, or in part, for the following medical condition, which is the primary reason for home  health care:  acute on chronic diastolic chf   I certify that, based on my findings, the following services are medically necessary home health services:   Nursing Physical therapy     Reason for Medically Necessary Home Health Services:   Skilled Nursing- Change/Decline in Patient Status Skilled Nursing- Changes in Medication/Medication Management Skilled Nursing- Skilled Assessment/Observation Therapy- Home Adaptation to Facilitate Safety Therapy- Therapeutic Exercises to Increase Strength and Endurance     My clinical findings support the need for the above services:  Shortness of breath with activity   Further, I certify that my clinical findings support that this patient is homebound due to:  Shortness of Breath with activity   Heart failure home health orders    Complete by:  As directed   Heart Failure Follow-up Care:  Verify follow-up appointments per Patient Discharge Instructions.  Confirm transportation arranged. Reconcile home medications with discharge medication list. Remove discontinued medications from use. Assist patient/caregiver to manage medications using pill box. Reinforce low sodium food selection Assessments: Vital signs and oxygen saturation at each visit. Assess home environment for safety concerns, caregiver support and availability of low-sodium foods. Consult Education officer, museum, PT/OT, Dietitian, and CNA based on assessments. Perform comprehensive cardiopulmonary assessment. Notify MD for any change in condition or weight gain of 3 pounds in one day or 5 pounds in one week with symptoms. Daily Weights and Symptom Monitoring: Ensure patient has access to scales. Teach patient/caregiver to weigh daily before breakfast and after voiding using same scale and record.    Teach patient/caregiver to track weight and symptoms and when to notify Provider. Activity: Develop individualized activity plan with patient/caregiver.    Heart Failure Follow-up Care:  Or per Doctor (see comments)   Home Health Visits:  Set up telemonitoring equipment to monitor daily vital signs, weights and oxygen saturation   Obtain the following labs:  Basic Metabolic Panel   Lab frequency:  Other see comments   Fax lab results to:  [facility] at [phone]   Diet:  Low Sodium Heart Healthy   Fluid restrictions:  1200 mL Fluid   Skilled Nurse to notify MD of weight trends weekly for first 2 weeks. May fax or call:   (call) OR fax to: Other see comments [facility] at [phone] (fax) or [phone]     Consult:  Social work   Initiate Heart Failure Clinic Diuretic Protocol to be used by [place] only ( to be ordered by Heart Failure Team Providers Only):  Yes   Increase activity slowly    Complete by:  As directed       Medication List    STOP taking these medications   gabapentin 300 MG capsule Commonly known as:  NEURONTIN     TAKE these medications     furosemide 40 MG tablet Commonly known as:  LASIX Take 1 tablet (40 mg total) by mouth daily. What changed:  how much to take   Melatonin 3 MG Tabs Take 6 mg by mouth at bedtime as needed (for sleep).   metoprolol succinate 50 MG 24 hr tablet Commonly known as:  TOPROL-XL Take 1 tablet (50 mg total) by mouth daily. Take with or immediately following a meal. Start taking on:  05/28/2016 What changed:  See the new instructions.   Potassium Chloride ER 20 MEQ Tbcr Take 10 mEq by mouth daily.   RESTASIS 0.05 % ophthalmic emulsion Generic drug:  cycloSPORINE Place 1 drop into both eyes 2 (two) times  daily.   rivastigmine 3 MG capsule Commonly known as:  EXELON Take 1 capsule (3 mg total) by mouth 2 (two) times daily.   Suvorexant 20 MG Tabs Commonly known as:  BELSOMRA Take 1 tablet by mouth at bedtime as needed.   traMADol 50 MG tablet Commonly known as:  ULTRAM Take 50 mg by mouth 2 (two) times daily as needed (for pain). Reported on 01/22/2016   VITAMIN B-12 PO Take 1 tablet by mouth daily.   VITAMIN D-3 PO Take 1 capsule by mouth daily.   VOLTAREN 1 % Gel Generic drug:  diclofenac sodium Apply 1 application topically daily as needed (for leg pain).   XARELTO 20 MG Tabs tablet Generic drug:  rivaroxaban Take 1 tablet by mouth nightly      Follow-up Information    Mathews Argyle, MD. Go on 05/29/2016.   Specialty:  Internal Medicine Why:  10:15 AM Contact information: [address] [place] [address] [phone]        Cristopher Peru, MD. Go on 06/11/2016.   Specialty:  Cardiology Why:  9:20 AM Contact information: [id number] [place] [address] [phone]        [facility].   Why:  They will provide home health care services at your home Contact information: [place] [address] [phone]        [facility].   Contact  information: [place] [address] [phone]            Time coordinating discharge: 30 minutes.  Signed:  Angelyse Heslin  Pager [phone] [facility] 05/27/2016, 5:27 PM

## 2016-05-27 NOTE — Care Management Obs Status (Signed)
[facility] NOTIFICATION   Patient Details  Name: YSABELA IACOBELLI MRN: [id number] Date of Birth: 29-Jan-1928   Medicare Observation Status Notification Given:  Yes    Royston Bake, RN 05/27/2016, 3:36 PM

## 2016-05-27 NOTE — Discharge Instructions (Signed)
Heart Failure  Heart failure means your heart has trouble pumping blood. This makes it hard for your body to work well. Heart failure is usually a long-term (chronic) condition. You must take good care of yourself and follow your doctor's treatment plan.  HOME CARE   Take your heart medicine as told by your doctor.    Do not stop taking medicine unless your doctor tells you to.    Do not skip any dose of medicine.    Refill your medicines before they run out.    Take other medicines only as told by your doctor or pharmacist.   Stay active if told by your doctor. The elderly and people with severe heart failure should talk with a doctor about physical activity.   Eat heart-healthy foods. Choose foods that are without trans fat and are low in saturated fat, cholesterol, and salt (sodium). This includes fresh or frozen fruits and vegetables, fish, lean meats, fat-free or low-fat dairy foods, whole grains, and high-fiber foods. Lentils and dried peas and beans (legumes) are also good choices.   Limit salt if told by your doctor.   Cook in a healthy way. Roast, grill, broil, bake, poach, steam, or stir-fry foods.   Limit fluids as told by your doctor.   Weigh yourself every morning. Do this after you pee (urinate) and before you eat breakfast. Write down your weight to give to your doctor.   Take your blood pressure and write it down if your doctor tells you to.   Ask your doctor how to check your pulse. Check your pulse as told.   Lose weight if told by your doctor.   Stop smoking or chewing tobacco. Do not use gum or patches that help you quit without your doctor's approval.   Schedule and go to doctor visits as told.   Nonpregnant women should have no more than 1 drink a day. Men should have no more than 2 drinks a day. Talk to your doctor about drinking alcohol.   Stop illegal drug use.   Stay current with shots (immunizations).   Manage your health conditions as told by your doctor.   Learn to  manage your stress.   Rest when you are tired.   If it is really hot outside:    Avoid intense activities.    Use air conditioning or fans, or get in a cooler place.    Avoid caffeine and alcohol.    Wear loose-fitting, lightweight, and light-colored clothing.   If it is really cold outside:    Avoid intense activities.    Layer your clothing.    Wear mittens or gloves, a hat, and a scarf when going outside.    Avoid alcohol.   Learn about heart failure and get support as needed.   Get help to maintain or improve your quality of life and your ability to care for yourself as needed.  GET HELP IF:    You gain weight quickly.   You are more short of breath than usual.   You cannot do your normal activities.   You tire easily.   You cough more than normal, especially with activity.   You have any or more puffiness (swelling) in areas such as your hands, feet, ankles, or belly (abdomen).   You cannot sleep because it is hard to breathe.   You feel like your heart is beating fast (palpitations).   You get dizzy or light-headed when you stand up.  GET HELP   RIGHT AWAY IF:    You have trouble breathing.   There is a change in mental status, such as becoming less alert or not being able to focus.   You have chest pain or discomfort.   You faint.  MAKE SURE YOU:    Understand these instructions.   Will watch your condition.   Will get help right away if you are not doing well or get worse.     This information is not intended to replace advice given to you by your health care provider. Make sure you discuss any questions you have with your health care provider.     Document Released: 07/28/2008 Document Revised: 11/09/2014 Document Reviewed: 12/05/2012  Elsevier Interactive Patient Education 2016 [employer]

## 2016-05-27 NOTE — Progress Notes (Signed)
Notified Kirby NP on call about patient's elevated troponin 0.05. No orders given

## 2016-05-29 DIAGNOSIS — Z8673 Personal history of transient ischemic attack (TIA), and cerebral infarction without residual deficits: Secondary | ICD-10-CM | POA: Diagnosis not present

## 2016-05-29 DIAGNOSIS — Z96653 Presence of artificial knee joint, bilateral: Secondary | ICD-10-CM | POA: Diagnosis not present

## 2016-05-29 DIAGNOSIS — M810 Age-related osteoporosis without current pathological fracture: Secondary | ICD-10-CM | POA: Diagnosis not present

## 2016-05-29 DIAGNOSIS — I482 Chronic atrial fibrillation: Secondary | ICD-10-CM | POA: Diagnosis not present

## 2016-05-29 DIAGNOSIS — E785 Hyperlipidemia, unspecified: Secondary | ICD-10-CM | POA: Diagnosis not present

## 2016-05-29 DIAGNOSIS — I110 Hypertensive heart disease with heart failure: Secondary | ICD-10-CM | POA: Diagnosis not present

## 2016-05-29 DIAGNOSIS — I248 Other forms of acute ischemic heart disease: Secondary | ICD-10-CM | POA: Diagnosis not present

## 2016-05-29 DIAGNOSIS — I5033 Acute on chronic diastolic (congestive) heart failure: Secondary | ICD-10-CM | POA: Diagnosis not present

## 2016-05-29 DIAGNOSIS — Z96611 Presence of right artificial shoulder joint: Secondary | ICD-10-CM | POA: Diagnosis not present

## 2016-05-29 DIAGNOSIS — Z7901 Long term (current) use of anticoagulants: Secondary | ICD-10-CM | POA: Diagnosis not present

## 2016-06-01 DIAGNOSIS — I110 Hypertensive heart disease with heart failure: Secondary | ICD-10-CM | POA: Diagnosis not present

## 2016-06-01 DIAGNOSIS — M810 Age-related osteoporosis without current pathological fracture: Secondary | ICD-10-CM | POA: Diagnosis not present

## 2016-06-01 DIAGNOSIS — I248 Other forms of acute ischemic heart disease: Secondary | ICD-10-CM | POA: Diagnosis not present

## 2016-06-01 DIAGNOSIS — I482 Chronic atrial fibrillation: Secondary | ICD-10-CM | POA: Diagnosis not present

## 2016-06-01 DIAGNOSIS — I5033 Acute on chronic diastolic (congestive) heart failure: Secondary | ICD-10-CM | POA: Diagnosis not present

## 2016-06-01 DIAGNOSIS — E785 Hyperlipidemia, unspecified: Secondary | ICD-10-CM | POA: Diagnosis not present

## 2016-06-02 DIAGNOSIS — I4891 Unspecified atrial fibrillation: Secondary | ICD-10-CM | POA: Diagnosis not present

## 2016-06-02 DIAGNOSIS — I509 Heart failure, unspecified: Secondary | ICD-10-CM | POA: Diagnosis not present

## 2016-06-02 DIAGNOSIS — I10 Essential (primary) hypertension: Secondary | ICD-10-CM | POA: Diagnosis not present

## 2016-06-02 DIAGNOSIS — R600 Localized edema: Secondary | ICD-10-CM | POA: Diagnosis not present

## 2016-06-02 DIAGNOSIS — Z79899 Other long term (current) drug therapy: Secondary | ICD-10-CM | POA: Diagnosis not present

## 2016-06-02 DIAGNOSIS — R194 Change in bowel habit: Secondary | ICD-10-CM | POA: Diagnosis not present

## 2016-06-03 DIAGNOSIS — I110 Hypertensive heart disease with heart failure: Secondary | ICD-10-CM | POA: Diagnosis not present

## 2016-06-03 DIAGNOSIS — M810 Age-related osteoporosis without current pathological fracture: Secondary | ICD-10-CM | POA: Diagnosis not present

## 2016-06-03 DIAGNOSIS — E785 Hyperlipidemia, unspecified: Secondary | ICD-10-CM | POA: Diagnosis not present

## 2016-06-03 DIAGNOSIS — I482 Chronic atrial fibrillation: Secondary | ICD-10-CM | POA: Diagnosis not present

## 2016-06-03 DIAGNOSIS — I5033 Acute on chronic diastolic (congestive) heart failure: Secondary | ICD-10-CM | POA: Diagnosis not present

## 2016-06-03 DIAGNOSIS — I248 Other forms of acute ischemic heart disease: Secondary | ICD-10-CM | POA: Diagnosis not present

## 2016-06-05 DIAGNOSIS — I5033 Acute on chronic diastolic (congestive) heart failure: Secondary | ICD-10-CM | POA: Diagnosis not present

## 2016-06-05 DIAGNOSIS — I110 Hypertensive heart disease with heart failure: Secondary | ICD-10-CM | POA: Diagnosis not present

## 2016-06-05 DIAGNOSIS — E785 Hyperlipidemia, unspecified: Secondary | ICD-10-CM | POA: Diagnosis not present

## 2016-06-05 DIAGNOSIS — M810 Age-related osteoporosis without current pathological fracture: Secondary | ICD-10-CM | POA: Diagnosis not present

## 2016-06-05 DIAGNOSIS — I482 Chronic atrial fibrillation: Secondary | ICD-10-CM | POA: Diagnosis not present

## 2016-06-05 DIAGNOSIS — I248 Other forms of acute ischemic heart disease: Secondary | ICD-10-CM | POA: Diagnosis not present

## 2016-06-10 DIAGNOSIS — I5033 Acute on chronic diastolic (congestive) heart failure: Secondary | ICD-10-CM | POA: Diagnosis not present

## 2016-06-10 DIAGNOSIS — M810 Age-related osteoporosis without current pathological fracture: Secondary | ICD-10-CM | POA: Diagnosis not present

## 2016-06-10 DIAGNOSIS — I110 Hypertensive heart disease with heart failure: Secondary | ICD-10-CM | POA: Diagnosis not present

## 2016-06-10 DIAGNOSIS — I248 Other forms of acute ischemic heart disease: Secondary | ICD-10-CM | POA: Diagnosis not present

## 2016-06-10 DIAGNOSIS — I482 Chronic atrial fibrillation: Secondary | ICD-10-CM | POA: Diagnosis not present

## 2016-06-10 DIAGNOSIS — E785 Hyperlipidemia, unspecified: Secondary | ICD-10-CM | POA: Diagnosis not present

## 2016-06-11 ENCOUNTER — Encounter: Payer: PRIVATE HEALTH INSURANCE | Admitting: Nurse Practitioner

## 2016-06-15 DIAGNOSIS — I5033 Acute on chronic diastolic (congestive) heart failure: Secondary | ICD-10-CM | POA: Diagnosis not present

## 2016-06-15 DIAGNOSIS — M810 Age-related osteoporosis without current pathological fracture: Secondary | ICD-10-CM | POA: Diagnosis not present

## 2016-06-15 DIAGNOSIS — I110 Hypertensive heart disease with heart failure: Secondary | ICD-10-CM | POA: Diagnosis not present

## 2016-06-15 DIAGNOSIS — E785 Hyperlipidemia, unspecified: Secondary | ICD-10-CM | POA: Diagnosis not present

## 2016-06-15 DIAGNOSIS — I482 Chronic atrial fibrillation: Secondary | ICD-10-CM | POA: Diagnosis not present

## 2016-06-15 DIAGNOSIS — I248 Other forms of acute ischemic heart disease: Secondary | ICD-10-CM | POA: Diagnosis not present

## 2016-06-16 ENCOUNTER — Encounter: Payer: Self-pay | Admitting: Nurse Practitioner

## 2016-06-23 ENCOUNTER — Encounter: Payer: PRIVATE HEALTH INSURANCE | Admitting: Physician Assistant

## 2016-06-24 DIAGNOSIS — E785 Hyperlipidemia, unspecified: Secondary | ICD-10-CM | POA: Diagnosis not present

## 2016-06-24 DIAGNOSIS — I482 Chronic atrial fibrillation: Secondary | ICD-10-CM | POA: Diagnosis not present

## 2016-06-24 DIAGNOSIS — I248 Other forms of acute ischemic heart disease: Secondary | ICD-10-CM | POA: Diagnosis not present

## 2016-06-24 DIAGNOSIS — M810 Age-related osteoporosis without current pathological fracture: Secondary | ICD-10-CM | POA: Diagnosis not present

## 2016-06-24 DIAGNOSIS — I5033 Acute on chronic diastolic (congestive) heart failure: Secondary | ICD-10-CM | POA: Diagnosis not present

## 2016-06-24 DIAGNOSIS — I110 Hypertensive heart disease with heart failure: Secondary | ICD-10-CM | POA: Diagnosis not present

## 2016-06-29 ENCOUNTER — Encounter (INDEPENDENT_AMBULATORY_CARE_PROVIDER_SITE_OTHER): Payer: Self-pay

## 2016-06-29 ENCOUNTER — Encounter: Payer: Self-pay | Admitting: Nurse Practitioner

## 2016-06-29 ENCOUNTER — Ambulatory Visit (INDEPENDENT_AMBULATORY_CARE_PROVIDER_SITE_OTHER): Payer: Medicare Other | Admitting: Nurse Practitioner

## 2016-06-29 VITALS — BP 120/66 | HR 66 | Ht 59.0 in | Wt 121.8 lb

## 2016-06-29 DIAGNOSIS — I5032 Chronic diastolic (congestive) heart failure: Secondary | ICD-10-CM

## 2016-06-29 DIAGNOSIS — I480 Paroxysmal atrial fibrillation: Secondary | ICD-10-CM

## 2016-06-29 DIAGNOSIS — I872 Venous insufficiency (chronic) (peripheral): Secondary | ICD-10-CM

## 2016-06-29 DIAGNOSIS — R0602 Shortness of breath: Secondary | ICD-10-CM

## 2016-06-29 DIAGNOSIS — E785 Hyperlipidemia, unspecified: Secondary | ICD-10-CM

## 2016-06-29 LAB — CBC
HCT: 39.8 % (ref 35.0–45.0)
Hemoglobin: 13.3 g/dL (ref 11.7–15.5)
MCH: 31.5 pg (ref 27.0–33.0)
MCHC: 33.4 g/dL (ref 32.0–36.0)
MCV: 94.3 fL (ref 80.0–100.0)
MPV: 11.8 fL (ref 7.5–12.5)
Platelets: 140 10*3/uL (ref 140–400)
RBC: 4.22 MIL/uL (ref 3.80–5.10)
RDW: 13.5 % (ref 11.0–15.0)
WBC: 5.4 10*3/uL (ref 3.8–10.8)

## 2016-06-29 LAB — BASIC METABOLIC PANEL
BUN: 35 mg/dL — ABNORMAL HIGH (ref 7–25)
CO2: 34 mmol/L — ABNORMAL HIGH (ref 20–31)
Calcium: 9.5 mg/dL (ref 8.6–10.4)
Chloride: 96 mmol/L — ABNORMAL LOW (ref 98–110)
Creat: 1.06 mg/dL — ABNORMAL HIGH (ref 0.60–0.88)
Glucose, Bld: 102 mg/dL — ABNORMAL HIGH (ref 65–99)
Potassium: 3.9 mmol/L (ref 3.5–5.3)
Sodium: 139 mmol/L (ref 135–146)

## 2016-06-29 LAB — HEPATIC FUNCTION PANEL
ALT: 27 U/L (ref 6–29)
AST: 27 U/L (ref 10–35)
Albumin: 4.1 g/dL (ref 3.6–5.1)
Alkaline Phosphatase: 58 U/L (ref 33–130)
Bilirubin, Direct: 0.1 mg/dL (ref <=0.2)
Indirect Bilirubin: 0.3 mg/dL (ref 0.2–1.2)
Total Bilirubin: 0.4 mg/dL (ref 0.2–1.2)
Total Protein: 6.5 g/dL (ref 6.1–8.1)

## 2016-06-29 LAB — TSH: TSH: 1.49 mIU/L

## 2016-06-29 NOTE — Patient Instructions (Addendum)
We will be checking the following labs today - BMET, CBC, HPF, TSH and BNP   Medication Instructions:    Continue with your current medicines. I am going to talk with Dr. Lovena Le tomorrow about putting you on additional medicine to prevent your atrial fib from coming back.     Testing/Procedures To Be Arranged:  N/A  Follow-Up:   After I talk with Dr. Lovena Le - I will call you and tell you what we have decided.  We will then decide about when to come back here for another visit.     Other Special Instructions:   N/A    If you need a refill on your cardiac medications before your next appointment, please call your pharmacy.   Call the [place] office at [phone] if you have any questions, problems or concerns.

## 2016-06-29 NOTE — Progress Notes (Addendum)
CARDIOLOGY OFFICE NOTE  Date:  06/29/2016    [name] Date of Birth: August 27, 1928 Medical Record [id number]  PCP:  Mathews Argyle, MD  Cardiologist:  Atilano Median    Chief Complaint  Patient presents with  . Atrial Fibrillation  . Edema    1 year check - seen for Dr. Lovena Le  . Congestive Heart Failure    History of Present Illness: [name] is a 82 y.o. female who presents today for a follow up visit. This is almost a one year check. She is seen for Dr. Lovena Le. Former patient of Dr. Susa Simmonds. I previously took care of her husband [name] for many years.   She has a history of paroxysmal atrial fibrillation, prior stroke, dyslipidemia, hypertension, and diastolic heart failure. The patient has rheumatoid arthritis. Her atrial arrhythmias have been well-controlled. She remains on chronic anticoagulation.   Last seen by Dr. Lovena Le back in May of 2016 - felt to be doing ok from our standpoint.  I saw her back last September - lots more swelling in her legs - worse after being switched over to Verapamil. Good EF by echo.    She was hospitalized last month with acute on chronic diastolic HF. HR initially elevated but improved with metoprolol. She was in atrial fib - looks to me like this was probably the trigger for her demise. Looks like it was assumed she had chronic AF - however she was in NSR on EKG last year. Now on Xarelto for her anticoagulation. Metoprolol dose was increased along with her Lasix.   Comes in today. Here alone. Looks to me like the AF was probably here trigger for this recent exacerbation. She is pretty careful about her sodium use. She is feeling better. She notes her weight is down. Swelling has improved. Breathing has improved. No problems with the Xarelto. She is asking about being on ACE and even being on Entresto for her heart failure. EF is normal. Has diastolic dysfunction.    Past Medical History:  Diagnosis Date  . Arthritis    severe  .  Atrial fibrillation ([place])   . Chronic diastolic CHF (congestive heart failure) ([place])   . Chronic insomnia   . Degenerative arthritis   . Essential hypertension   . Hypercholesteremia   . Memory deficit 05/24/2013  . Osteoporosis   . PAF (paroxysmal atrial fibrillation) ([place])    a. Intolerant to amiodarone, discontinued October 2012.  . Sinus bradycardia   . Stroke [facility]) 2007   mini stroke or TIA with aphasia     Past Surgical History:  Procedure Laterality Date  . CATARACT EXTRACTION  2009   ou  . FEMUR FRACTURE SURGERY  8/12  . left knee  2001  . REPLACEMENT TOTAL KNEE BILATERAL Right 1999  . right intertrochanteric hip fracture status post ORIF  August 2012  . right shoulder replacement  2009     Medications: Current Outpatient Prescriptions  Medication Sig Dispense Refill  . Cholecalciferol (VITAMIN D-3 PO) Take 1 capsule by mouth daily.    . Cyanocobalamin (VITAMIN B-12 PO) Take 1 tablet by mouth daily.    . furosemide (LASIX) 40 MG tablet Take 40 mg by mouth 2 (two) times daily.    . Melatonin 3 MG TABS Take 6 mg by mouth at bedtime as needed (for sleep).    . metoprolol succinate (TOPROL-XL) 50 MG 24 hr tablet Take 1 tablet (50 mg total) by mouth daily. Take with or  immediately following a meal. 30 tablet 3  . potassium chloride 20 MEQ TBCR Take 10 mEq by mouth daily. 30 tablet 3  . RESTASIS 0.05 % ophthalmic emulsion Place 1 drop into both eyes 2 (two) times daily.    . rivastigmine (EXELON) 3 MG capsule Take 1 capsule (3 mg total) by mouth 2 (two) times daily. 60 capsule 5  . traMADol (ULTRAM) 50 MG tablet Take 50 mg by mouth 2 (two) times daily as needed (for pain). Reported on 01/22/2016    . VOLTAREN 1 % GEL Apply 1 application topically daily as needed (for leg pain).   3  . XARELTO 20 MG TABS tablet Take 1 tablet by mouth nightly  11   No current facility-administered medications for this visit.     Allergies: Allergies  Allergen Reactions  . Codeine Nausea  Only    Social History: The patient  reports that she has never smoked. She has never used smokeless tobacco. She reports that she drinks about 4.2 oz of alcohol per week . She reports that she does not use drugs.   Family History: The patient's family history includes Cancer in her father, sister, sister, and son; Cancer - Colon in her father; Dementia in her mother; Stroke in her mother.   Review of Systems: Please see the history of present illness.   Otherwise, the review of systems is positive for none.   All other systems are reviewed and negative.   Physical Exam: VS:  BP 120/66   Pulse 66   Ht 4\' 11"  (1.499 m)   Wt 121 lb 12.8 oz (55.2 kg)   SpO2 98% Comment: at rest  BMI 24.60 kg/m  .  BMI Body mass index is 24.6 kg/m.  Wt Readings from Last 3 Encounters:  06/29/16 121 lb 12.8 oz (55.2 kg)  05/27/16 124 lb 3.2 oz (56.3 kg)  01/22/16 127 lb 6.4 oz (57.8 kg)    General: Pleasant. Elderly female who is alert and in no acute distress.   HEENT: Normal.  Neck: Supple, no JVD, carotid bruits, or masses noted.  Cardiac: Regular rate and rhythm. Trace edema.  Respiratory:  Lungs are clear to auscultation bilaterally with normal work of breathing.  GI: Soft and nontender.  MS: No deformity or atrophy. Gait and ROM intact. Little kyphotic. Skin: Warm and dry. Color is normal.  Neuro:  Strength and sensation are intact and no gross focal deficits noted.  Psych: Alert, appropriate and with normal affect.   LABORATORY DATA:  EKG:  EKG is ordered today. This shows NSR with a rate of 60.    Lab Results  Component Value Date   WBC 5.1 05/27/2016   HGB 12.0 05/27/2016   HCT 36.9 05/27/2016   PLT 104 (L) 05/27/2016   GLUCOSE 105 (H) 05/27/2016   ALT 61 (H) 05/27/2016   AST 46 (H) 05/27/2016   NA 134 (L) 05/27/2016   K 4.0 05/27/2016   CL 99 (L) 05/27/2016   CREATININE 0.94 05/27/2016   BUN 22 (H) 05/27/2016   CO2 28 05/27/2016   TSH 1.56 01/23/2015   INR 1.26  05/26/2016    BNP (last 3 results)  Recent Labs  07/03/15 1018 05/26/16 1425  BNP 413.6* 702.6*    ProBNP (last 3 results) No results for input(s): PROBNP in the last 8760 hours.   Other Studies Reviewed Today:  Echo Study Conclusions from 03/2015  - Left ventricle: The cavity size was normal. Wall thickness was increased  in a pattern of mild LVH. Systolic function was normal. The estimated ejection fraction was in the range of 55% to 60%. Doppler parameters are consistent with abnormal left ventricular relaxation (grade 1 diastolic dysfunction). - Aortic valve: There was mild regurgitation. - Mitral valve: Calcified annulus. Mildly thickened leaflets . There was mild regurgitation. - Atrial septum: No defect or patent foramen ovale was identified. - Pulmonary arteries: PA peak pressure: 35 mm Hg (S).    Lower Extremity Duplex Summary: No evidence of deep vein thrombosis involving the right common femoral vein and left lower extremity.  Other specific details can be found in the table(s) above. Prepared and Electronically Authenticated by  Gae Gallop MD 2016-09-02T14:05:48  Assessment/Plan: 1. Chronic diastolic HF - with recent exacerbation - I suspect this is due to recurrent AF with RVR - she looks better. Seems to be in sinus by exam. Check EKG today. Recheck her lab today - may need to cut her diuretic back but will see what her lab shows.   2. PAF - back in NSR by exam and by EKG today. Will talk with Dr. Lovena Le about possible low dose amiodarone. Recheck surveillance labs today. Further disposition to follow.   3. Chronic anticoagulation - no problems noted. Some mild thrombocytopenia noted on last CBC - recheck today.  5. Advanced age.   Current medicines are reviewed with the patient today.  The patient does not have concerns regarding medicines other than what has been noted above.  The following changes have been made:  See  above.  Labs/ tests ordered today include:    Orders Placed This Encounter  Procedures  . Basic metabolic panel  . Hepatic function panel  . TSH  . Brain natriuretic peptide  . CBC     Disposition:   Further disposition to follow.   Patient is agreeable to this plan and will call if any problems develop in the interim.   Signed: Burtis Junes, RN, ANP-C 06/29/2016 3:42 PM  [facility] [address] [place], [place]  [address] Phone: [phone] Fax: [phone]      Addendum: Discussed with Dr. Lovena Le today - will start low dose Amiodarone at 200 mg a day - see back in about 2 weeks with EKG. Plan to cut the dose back to 100 mg after 3 months of therapy.   Burtis Junes, RN, [facility] [address] [place], [place]  [phone]

## 2016-06-30 ENCOUNTER — Other Ambulatory Visit: Payer: Self-pay | Admitting: *Deleted

## 2016-06-30 LAB — BRAIN NATRIURETIC PEPTIDE: Brain Natriuretic Peptide: 322.5 pg/mL — ABNORMAL HIGH (ref <100)

## 2016-06-30 MED ORDER — AMIODARONE HCL 200 MG PO TABS: 200.0000 mg | ORAL_TABLET | Freq: Every day | ORAL | 9 refills | Status: DC

## 2016-06-30 MED ORDER — FUROSEMIDE 40 MG PO TABS: 60.0000 mg | ORAL_TABLET | Freq: Every day | ORAL | 9 refills | Status: DC

## 2016-07-01 DIAGNOSIS — E785 Hyperlipidemia, unspecified: Secondary | ICD-10-CM | POA: Diagnosis not present

## 2016-07-01 DIAGNOSIS — I5033 Acute on chronic diastolic (congestive) heart failure: Secondary | ICD-10-CM | POA: Diagnosis not present

## 2016-07-01 DIAGNOSIS — I110 Hypertensive heart disease with heart failure: Secondary | ICD-10-CM | POA: Diagnosis not present

## 2016-07-01 DIAGNOSIS — I482 Chronic atrial fibrillation: Secondary | ICD-10-CM | POA: Diagnosis not present

## 2016-07-01 DIAGNOSIS — I248 Other forms of acute ischemic heart disease: Secondary | ICD-10-CM | POA: Diagnosis not present

## 2016-07-01 DIAGNOSIS — M810 Age-related osteoporosis without current pathological fracture: Secondary | ICD-10-CM | POA: Diagnosis not present

## 2016-07-03 ENCOUNTER — Telehealth: Payer: Self-pay | Admitting: Internal Medicine

## 2016-07-03 NOTE — Telephone Encounter (Signed)
S/w Beth, Home health nurse wanted an order to go back out and see pt  Weekly since only comes out every two weeks due to starting Amio and coming to the end of home health period.  Stated [name], does not need a home health nurse since pt is coming back in two weeks.  Agreeable to plan.

## 2016-07-03 NOTE — Telephone Encounter (Signed)
New Message:    Needs an order to see pt next week,so they can follow up on her Amiodarone> A verbal order will be fine.

## 2016-07-03 NOTE — Telephone Encounter (Signed)
Ok to give order. She has an appointment for follow up with me for her amiodarone.

## 2016-07-07 DIAGNOSIS — L820 Inflamed seborrheic keratosis: Secondary | ICD-10-CM | POA: Diagnosis not present

## 2016-07-08 ENCOUNTER — Encounter: Payer: Self-pay | Admitting: Nurse Practitioner

## 2016-07-13 ENCOUNTER — Encounter: Payer: Self-pay | Admitting: Nurse Practitioner

## 2016-07-13 ENCOUNTER — Ambulatory Visit (INDEPENDENT_AMBULATORY_CARE_PROVIDER_SITE_OTHER): Payer: Medicare Other | Admitting: Nurse Practitioner

## 2016-07-13 ENCOUNTER — Ambulatory Visit: Payer: Medicare Other | Admitting: Nurse Practitioner

## 2016-07-13 VITALS — BP 130/60 | HR 52 | Ht 59.5 in | Wt 122.0 lb

## 2016-07-13 DIAGNOSIS — Z79899 Other long term (current) drug therapy: Secondary | ICD-10-CM | POA: Diagnosis not present

## 2016-07-13 DIAGNOSIS — I5032 Chronic diastolic (congestive) heart failure: Secondary | ICD-10-CM

## 2016-07-13 DIAGNOSIS — I482 Chronic atrial fibrillation: Secondary | ICD-10-CM | POA: Diagnosis not present

## 2016-07-13 DIAGNOSIS — I5033 Acute on chronic diastolic (congestive) heart failure: Secondary | ICD-10-CM | POA: Diagnosis not present

## 2016-07-13 DIAGNOSIS — I110 Hypertensive heart disease with heart failure: Secondary | ICD-10-CM | POA: Diagnosis not present

## 2016-07-13 DIAGNOSIS — M810 Age-related osteoporosis without current pathological fracture: Secondary | ICD-10-CM | POA: Diagnosis not present

## 2016-07-13 DIAGNOSIS — Z7901 Long term (current) use of anticoagulants: Secondary | ICD-10-CM

## 2016-07-13 DIAGNOSIS — E785 Hyperlipidemia, unspecified: Secondary | ICD-10-CM | POA: Diagnosis not present

## 2016-07-13 DIAGNOSIS — I248 Other forms of acute ischemic heart disease: Secondary | ICD-10-CM | POA: Diagnosis not present

## 2016-07-13 DIAGNOSIS — I480 Paroxysmal atrial fibrillation: Secondary | ICD-10-CM | POA: Diagnosis not present

## 2016-07-13 MED ORDER — FUROSEMIDE 40 MG PO TABS: 40.0000 mg | ORAL_TABLET | Freq: Every day | ORAL | 9 refills | Status: DC

## 2016-07-13 NOTE — Progress Notes (Deleted)
CARDIOLOGY OFFICE NOTE  Date:  07/13/2016    [name] Date of Birth: 1928-05-12 Medical Record #[id number]  PCP:  Mathews Argyle, MD  Cardiologist:  Servando Snare & ***    No chief complaint on file.   History of Present Illness: [name] is a 82 y.o. female who presents today for a ***   Comes in today. Here with   Past Medical History:  Diagnosis Date  . Arthritis    severe  . Atrial fibrillation ([place])   . Chronic diastolic CHF (congestive heart failure) ([place])   . Chronic insomnia   . Degenerative arthritis   . Essential hypertension   . Hypercholesteremia   . Memory deficit 05/24/2013  . Osteoporosis   . PAF (paroxysmal atrial fibrillation) ([place])    a. Intolerant to amiodarone, discontinued October 2012.  . Sinus bradycardia   . Stroke [facility]) 2007   mini stroke or TIA with aphasia     Past Surgical History:  Procedure Laterality Date  . CATARACT EXTRACTION  2009   ou  . FEMUR FRACTURE SURGERY  8/12  . left knee  2001  . REPLACEMENT TOTAL KNEE BILATERAL Right 1999  . right intertrochanteric hip fracture status post ORIF  August 2012  . right shoulder replacement  2009     Medications: Current Outpatient Prescriptions  Medication Sig Dispense Refill  . amiodarone (PACERONE) 200 MG tablet Take 1 tablet (200 mg total) by mouth daily. 30 tablet 9  . Cholecalciferol (VITAMIN D-3 PO) Take 1 capsule by mouth daily.    . Cyanocobalamin (VITAMIN B-12 PO) Take 1 tablet by mouth daily.    . furosemide (LASIX) 40 MG tablet Take 1.5 tablets (60 mg total) by mouth daily. 30 tablet 9  . Melatonin 3 MG TABS Take 6 mg by mouth at bedtime as needed (for sleep).    . metoprolol succinate (TOPROL-XL) 50 MG 24 hr tablet Take 1 tablet (50 mg total) by mouth daily. Take with or immediately following a meal. 30 tablet 3  . potassium chloride 20 MEQ TBCR Take 10 mEq by mouth daily. 30 tablet 3  . RESTASIS 0.05 % ophthalmic emulsion Place 1 drop into both eyes 2 (two)  times daily.    . rivastigmine (EXELON) 3 MG capsule Take 1 capsule (3 mg total) by mouth 2 (two) times daily. 60 capsule 5  . traMADol (ULTRAM) 50 MG tablet Take 50 mg by mouth 2 (two) times daily as needed (for pain). Reported on 01/22/2016    . VOLTAREN 1 % GEL Apply 1 application topically daily as needed (for leg pain).   3  . XARELTO 20 MG TABS tablet Take 1 tablet by mouth nightly  11   No current facility-administered medications for this visit.     Allergies: Allergies  Allergen Reactions  . Codeine Nausea Only    Social History: The patient  reports that she has never smoked. She has never used smokeless tobacco. She reports that she drinks about 4.2 oz of alcohol per week . She reports that she does not use drugs.   Family History: The patient's ***family history includes Cancer in her father, sister, sister, and son; Cancer - Colon in her father; Dementia in her mother; Stroke in her mother.   Review of Systems: Please see the history of present illness.   Otherwise, the review of systems is positive for {NONE DEFAULTED:18576::"none"}.   All other systems are reviewed and negative.   Physical Exam:  VS:  There were no vitals taken for this visit. Marland Kitchen  BMI There is no height or weight on file to calculate BMI.  Wt Readings from Last 3 Encounters:  06/29/16 121 lb 12.8 oz (55.2 kg)  05/27/16 124 lb 3.2 oz (56.3 kg)  01/22/16 127 lb 6.4 oz (57.8 kg)    General: Pleasant. Well developed, well nourished and in no acute distress.   HEENT: Normal.  Neck: Supple, no JVD, carotid bruits, or masses noted.  Cardiac: ***Regular rate and rhythm. No murmurs, rubs, or gallops. No edema.  Respiratory:  Lungs are clear to auscultation bilaterally with normal work of breathing.  GI: Soft and nontender.  MS: No deformity or atrophy. Gait and ROM intact.  Skin: Warm and dry. Color is normal.  Neuro:  Strength and sensation are intact and no gross focal deficits noted.  Psych: Alert,  appropriate and with normal affect.   LABORATORY DATA:  EKG:  EKG {ACTION; IS/IS [id number]} ordered today. This demonstrates ***.  Lab Results  Component Value Date   WBC 5.4 06/29/2016   HGB 13.3 06/29/2016   HCT 39.8 06/29/2016   PLT 140 06/29/2016   GLUCOSE 102 (H) 06/29/2016   ALT 27 06/29/2016   AST 27 06/29/2016   NA 139 06/29/2016   K 3.9 06/29/2016   CL 96 (L) 06/29/2016   CREATININE 1.06 (H) 06/29/2016   BUN 35 (H) 06/29/2016   CO2 34 (H) 06/29/2016   TSH 1.49 06/29/2016   INR 1.26 05/26/2016    BNP (last 3 results)  Recent Labs  05/26/16 1425 06/29/16 1606  BNP 702.6* 322.5*    ProBNP (last 3 results) No results for input(s): PROBNP in the last 8760 hours.   Other Studies Reviewed Today:   Assessment/Plan:   Current medicines are reviewed with the patient today.  The patient does not have concerns regarding medicines other than what has been noted above.  The following changes have been made:  See above.  Labs/ tests ordered today include:   No orders of the defined types were placed in this encounter.    Disposition:   FU with *** in {gen number 6-38:937342} {Days to years:10300}.   Patient is agreeable to this plan and will call if any problems develop in the interim.   Signed: Burtis Junes, RN, ANP-C 07/13/2016 7:11 AM  [facility] [address] [place], [place]  [address] Phone: [phone] Fax: [phone]

## 2016-07-13 NOTE — Progress Notes (Signed)
CARDIOLOGY OFFICE NOTE  Date:  07/13/2016    [name] Date of Birth: 08-27-1928 Medical Record #[id number]  PCP:  Mathews Argyle, MD  Cardiologist:  Atilano Median    Chief Complaint  Patient presents with  . Atrial Fibrillation    Follow up visit - seen for Dr. Lovena Le    History of Present Illness: [name] is a 82 y.o. female who presents today for a follow up visit. She is seen for Dr. Lovena Le. Former patient of Dr. Susa Simmonds. I previously took care of her husband [name] for many years.   She has a history of paroxysmal atrial fibrillation, prior stroke, dyslipidemia, hypertension, and diastolic heart failure. The patient has rheumatoid arthritis. Her atrial arrhythmias have been well-controlled. She remains on chronic anticoagulation.   Last seen by Dr. Lovena Le back in May of 2016 - felt to be doing ok from our standpoint.  I saw her back last September - lots more swelling in her legs - worse after being switched over to Verapamil. Good EF by echo.    She was hospitalized back in July with acute on chronic diastolic HF. HR initially elevated but improved with metoprolol. She was in atrial fib - looks to me like this was probably the trigger for her demise. Looks like it was assumed she had chronic AF - however she was in NSR on EKG last year. Now on Xarelto for her anticoagulation. Metoprolol dose was increased along with her Lasix.   I saw her back for follow up - I felt like her AF was the probable trigger for recent exacerbation. Discussed with Dr. Lovena Le and we elected to place her on amiodarone therapy - low dose.   Comes in today. Here alone. She called earlier today to get an earlier time - appointment was moved to the 18th - she was not told. She then showed up and became very upset when she learned that she no longer had an appointment.   She feels like she is doing ok. Has had some sleeping issues and wonders if it is from the amiodarone - but has  chronic insomnia for years. Has had some palpitations noted. Home health came today and noted her rate was in the 60's but irregular. Breathing is better - she clearly notes this. Weight is stable.  Has had some dizziness but not really clear if it has gotten worse over the past month. She is unsteady. No falls.   Past Medical History:  Diagnosis Date  . Arthritis    severe  . Atrial fibrillation ([place])   . Chronic diastolic CHF (congestive heart failure) ([place])   . Chronic insomnia   . Degenerative arthritis   . Essential hypertension   . Hypercholesteremia   . Memory deficit 05/24/2013  . Osteoporosis   . PAF (paroxysmal atrial fibrillation) ([place])    a. Intolerant to amiodarone, discontinued October 2012.  . Sinus bradycardia   . Stroke [facility]) 2007   mini stroke or TIA with aphasia     Past Surgical History:  Procedure Laterality Date  . CATARACT EXTRACTION  2009   ou  . FEMUR FRACTURE SURGERY  8/12  . left knee  2001  . REPLACEMENT TOTAL KNEE BILATERAL Right 1999  . right intertrochanteric hip fracture status post ORIF  August 2012  . right shoulder replacement  2009     Medications: Current Outpatient Prescriptions  Medication Sig Dispense Refill  . alclomethasone (ACLOVATE) 0.05 % cream Apply  1 application topically as needed (rash/redness).     Marland Kitchen amiodarone (PACERONE) 200 MG tablet Take 1 tablet (200 mg total) by mouth daily. 30 tablet 9  . Cholecalciferol (VITAMIN D-3 PO) Take 1 capsule by mouth daily.    . Cyanocobalamin (VITAMIN B-12 PO) Take 1 tablet by mouth daily.    . furosemide (LASIX) 40 MG tablet Take 1 tablet (40 mg total) by mouth daily. Extra 20 mg prn weight gain of 2 pounds overnight. 30 tablet 9  . Melatonin 3 MG TABS Take 6 mg by mouth at bedtime as needed (for sleep).    . metoprolol succinate (TOPROL-XL) 50 MG 24 hr tablet Take 1 tablet (50 mg total) by mouth daily. Take with or immediately following a meal. 30 tablet 3  . potassium chloride 20 MEQ TBCR  Take 10 mEq by mouth daily. 30 tablet 3  . RESTASIS 0.05 % ophthalmic emulsion Place 1 drop into both eyes 2 (two) times daily.    . rivastigmine (EXELON) 3 MG capsule Take 1 capsule (3 mg total) by mouth 2 (two) times daily. 60 capsule 5  . traMADol (ULTRAM) 50 MG tablet Take 50 mg by mouth 2 (two) times daily as needed (for pain). Reported on 01/22/2016    . VOLTAREN 1 % GEL Apply 1 application topically daily as needed (for leg pain).   3  . XARELTO 20 MG TABS tablet Take 1 tablet by mouth nightly  11   No current facility-administered medications for this visit.     Allergies: Allergies  Allergen Reactions  . Codeine Nausea Only    Social History: The patient  reports that she has never smoked. She has never used smokeless tobacco. She reports that she drinks about 4.2 oz of alcohol per week . She reports that she does not use drugs.   Family History: The patient's family history includes Cancer in her father, sister, sister, and son; Cancer - Colon in her father; Dementia in her mother; Stroke in her mother.   Review of Systems: Please see the history of present illness.   Otherwise, the review of systems is positive for none.   All other systems are reviewed and negative.   Physical Exam: VS:  BP 130/60   Pulse (!) 52   Ht 4' 11.5" (1.511 m)   Wt 122 lb (55.3 kg)   BMI 24.23 kg/m  .  BMI Body mass index is 24.23 kg/m.  Wt Readings from Last 3 Encounters:  07/13/16 122 lb (55.3 kg)  06/29/16 121 lb 12.8 oz (55.2 kg)  05/27/16 124 lb 3.2 oz (56.3 kg)    General: Pleasant. Elderly female who is alert and in no acute distress.   HEENT: Normal.  Neck: Supple, no JVD, carotid bruits, or masses noted.  Cardiac: Regular rate and rhythm. No murmurs, rubs, or gallops. No edema.  Respiratory:  Lungs are clear to auscultation bilaterally with normal work of breathing.  GI: Soft and nontender.  MS: No deformity or atrophy. Gait and ROM intact. She is using a cane.  Skin: Warm  and dry. Color is normal.  Neuro:  Strength and sensation are intact and no gross focal deficits noted.  Psych: Alert, appropriate and with normal affect.   LABORATORY DATA:  EKG:  EKG is ordered today. This demonstrates sinus bradycardia - her rate is 52.   Lab Results  Component Value Date   WBC 5.4 06/29/2016   HGB 13.3 06/29/2016   HCT 39.8 06/29/2016  PLT 140 06/29/2016   GLUCOSE 102 (H) 06/29/2016   ALT 27 06/29/2016   AST 27 06/29/2016   NA 139 06/29/2016   K 3.9 06/29/2016   CL 96 (L) 06/29/2016   CREATININE 1.06 (H) 06/29/2016   BUN 35 (H) 06/29/2016   CO2 34 (H) 06/29/2016   TSH 1.49 06/29/2016   INR 1.26 05/26/2016    BNP (last 3 results)  Recent Labs  05/26/16 1425 06/29/16 1606  BNP 702.6* 322.5*    ProBNP (last 3 results) No results for input(s): PROBNP in the last 8760 hours.   Other Studies Reviewed Today:  Echo Study Conclusions from 03/2015  - Left ventricle: The cavity size was normal. Wall thickness was increased in a pattern of mild LVH. Systolic function was normal. The estimated ejection fraction was in the range of 55% to 60%. Doppler parameters are consistent with abnormal left ventricular relaxation (grade 1 diastolic dysfunction). - Aortic valve: There was mild regurgitation. - Mitral valve: Calcified annulus. Mildly thickened leaflets . There was mild regurgitation. - Atrial septum: No defect or patent foramen ovale was identified. - Pulmonary arteries: PA peak pressure: 35 mm Hg (S).    Lower Extremity Duplex Summary: No evidence of deep vein thrombosis involving the right common femoral vein and left lower extremity.  Other specific details can be found in the table(s) above. Prepared and Electronically Authenticated by  Gae Gallop MD 2016-09-02T14:05:48  Assessment/Plan: 1. Chronic diastolic HF - with recent exacerbation - I suspected this is due to recurrent AF with RVR - she looks better. EKG today  shows she remains in NSR. Little more bradycardic. ?if she is symptomatic or not - hard to say. Have asked her to try and keep a diary.  2. PAF - back in NSR by exam and by EKG today. Now on low dose amiodarone - plan to cut back to 100 mg on return. May need to cut her beta blocker back as well. Lab on return.   3. Chronic anticoagulation - no problems noted.   4. CKD - cutting Lasix back -  Will use the extra 20 mg just prn weight gain of 2 pounds overnight. Repeat lab on return OV.   5. Advanced age.   Current medicines are reviewed with the patient today.  The patient does not have concerns regarding medicines other than what has been noted above.  The following changes have been made:  See above.  Labs/ tests ordered today include:    Orders Placed This Encounter  Procedures  . EKG 12-Lead     Disposition:   FU with me in 1 months.   Patient is agreeable to this plan and will call if any problems develop in the interim.   Signed: Burtis Junes, RN, ANP-C 07/13/2016 2:41 PM  [facility] [address] [place], [place]  [address] Phone: [phone] Fax: [phone]

## 2016-07-13 NOTE — Patient Instructions (Addendum)
We will be checking the following labs today - NONE   Medication Instructions:    Continue with your current medicines. BUT  Cut the Lasix back to just 40 mg a day - take the 20 mg only if your weight goes up 2 pounds or more overnight.     Testing/Procedures To Be Arranged:  N/A  Follow-Up:   See me in 3 weeks with EKG    Other Special Instructions:   Try to keep a record of your dizziness for me.     If you need a refill on your cardiac medications before your next appointment, please call your pharmacy.   Call the [place] office at [phone] if you have any questions, problems or concerns.

## 2016-07-20 ENCOUNTER — Ambulatory Visit: Payer: Medicare Other | Admitting: Nurse Practitioner

## 2016-07-22 DIAGNOSIS — I248 Other forms of acute ischemic heart disease: Secondary | ICD-10-CM | POA: Diagnosis not present

## 2016-07-22 DIAGNOSIS — I110 Hypertensive heart disease with heart failure: Secondary | ICD-10-CM | POA: Diagnosis not present

## 2016-07-22 DIAGNOSIS — I482 Chronic atrial fibrillation: Secondary | ICD-10-CM | POA: Diagnosis not present

## 2016-07-22 DIAGNOSIS — E785 Hyperlipidemia, unspecified: Secondary | ICD-10-CM | POA: Diagnosis not present

## 2016-07-22 DIAGNOSIS — M810 Age-related osteoporosis without current pathological fracture: Secondary | ICD-10-CM | POA: Diagnosis not present

## 2016-07-22 DIAGNOSIS — I5033 Acute on chronic diastolic (congestive) heart failure: Secondary | ICD-10-CM | POA: Diagnosis not present

## 2016-07-24 DIAGNOSIS — Z23 Encounter for immunization: Secondary | ICD-10-CM | POA: Diagnosis not present

## 2016-07-27 DIAGNOSIS — I482 Chronic atrial fibrillation: Secondary | ICD-10-CM | POA: Diagnosis not present

## 2016-07-27 DIAGNOSIS — E785 Hyperlipidemia, unspecified: Secondary | ICD-10-CM | POA: Diagnosis not present

## 2016-07-27 DIAGNOSIS — I5033 Acute on chronic diastolic (congestive) heart failure: Secondary | ICD-10-CM | POA: Diagnosis not present

## 2016-07-27 DIAGNOSIS — I248 Other forms of acute ischemic heart disease: Secondary | ICD-10-CM | POA: Diagnosis not present

## 2016-07-27 DIAGNOSIS — M810 Age-related osteoporosis without current pathological fracture: Secondary | ICD-10-CM | POA: Diagnosis not present

## 2016-07-27 DIAGNOSIS — I110 Hypertensive heart disease with heart failure: Secondary | ICD-10-CM | POA: Diagnosis not present

## 2016-07-28 DIAGNOSIS — Z1389 Encounter for screening for other disorder: Secondary | ICD-10-CM | POA: Diagnosis not present

## 2016-07-28 DIAGNOSIS — M810 Age-related osteoporosis without current pathological fracture: Secondary | ICD-10-CM | POA: Diagnosis not present

## 2016-07-28 DIAGNOSIS — Z0000 Encounter for general adult medical examination without abnormal findings: Secondary | ICD-10-CM | POA: Diagnosis not present

## 2016-08-04 ENCOUNTER — Ambulatory Visit (INDEPENDENT_AMBULATORY_CARE_PROVIDER_SITE_OTHER): Payer: Medicare Other | Admitting: Nurse Practitioner

## 2016-08-04 ENCOUNTER — Encounter: Payer: Self-pay | Admitting: Nurse Practitioner

## 2016-08-04 VITALS — BP 170/70 | HR 86 | Ht 59.0 in | Wt 122.8 lb

## 2016-08-04 DIAGNOSIS — I480 Paroxysmal atrial fibrillation: Secondary | ICD-10-CM

## 2016-08-04 MED ORDER — METOPROLOL SUCCINATE ER 25 MG PO TB24: 25.0000 mg | ORAL_TABLET | Freq: Every day | ORAL | 11 refills | Status: DC

## 2016-08-04 MED ORDER — RIVAROXABAN 15 MG PO TABS: 15.0000 mg | ORAL_TABLET | Freq: Every day | ORAL | 6 refills | Status: DC

## 2016-08-04 MED ORDER — AMIODARONE HCL 200 MG PO TABS: 100.0000 mg | ORAL_TABLET | Freq: Every day | ORAL | 11 refills | Status: DC

## 2016-08-04 NOTE — Patient Instructions (Addendum)
We will be checking the following labs today - NONE   Medication Instructions:    Continue with your current medicines. BUT  I am cutting the amiodarone back to just a half a pill each day (100 mg)  I am cutting your Metoprolol back to just 25 mg a day - this has been sent to your drug store.    Testing/Procedures To Be Arranged:  N/A  Follow-Up:   See me in about 6 weeks with EKG    Other Special Instructions:   N/A    If you need a refill on your cardiac medications before your next appointment, please call your pharmacy.   Call the [place] office at [phone] if you have any questions, problems or concerns.

## 2016-08-04 NOTE — Progress Notes (Signed)
CARDIOLOGY OFFICE NOTE  Date:  08/04/2016    [name] Date of Birth: 12-12-1927 Medical Record #[id number]  PCP:  Mathews Argyle, MD  Cardiologist:  Atilano Median   Chief Complaint  Patient presents with  . Atrial Fibrillation    3 week check - seen for Dr. Lovena Le    History of Present Illness: [name] is a 82 y.o. female who presents today for a follow up visit. This is an approximate 3 week check. She is seen for Dr. Lovena Le. Former patient of Dr. Susa Simmonds. I previously took care of her husband [name] for many years.   She has a history of paroxysmal atrial fibrillation, prior stroke, dyslipidemia, hypertension, and diastolic heart failure. The patient has rheumatoid arthritis. Her atrial arrhythmias have beenwell-controlled. She remains on chronic anticoagulation.   Last seen by Dr. Lovena Le back in May of 2016 - felt to be doing ok from our standpoint.  I saw her back last September - lots more swelling in her legs - worse after being switched over to Verapamil. Good EF by echo.   She was hospitalized back in July of 2017 with acute on chronic diastolic HF. HR initially elevated but improved with metoprolol. She was in atrial fib - looks to me like this was probably the trigger for her demise. Looks like itwas assumed she hadchronic AF - however she was in NSR on EKG last year. Now on Xarelto for her anticoagulation. Metoprolol dose was increased along with her Lasix.   I saw her back for follow up - I felt like her AF was the probable trigger for recent exacerbation. Discussed with Dr. Lovena Le and we elected to place her on amiodarone therapy - low dose.   See about 3 weeks ago - was doing ok. Rhythm ok. She was felt to be doing ok. Did have some dizziness but not clear where this was coming from.   Comes in today. Here alone. She has turned 88 since last visit here. She seems to be doing ok. Lots of orthopedic issues. Seems to notice a little more chest  pressure - happens at night -   Past Medical History:  Diagnosis Date  . Arthritis    severe  . Atrial fibrillation ([place])   . Chronic diastolic CHF (congestive heart failure) ([place])   . Chronic insomnia   . Degenerative arthritis   . Essential hypertension   . Hypercholesteremia   . Memory deficit 05/24/2013  . Osteoporosis   . PAF (paroxysmal atrial fibrillation) ([place])    a. Intolerant to amiodarone, discontinued October 2012.  . Sinus bradycardia   . Stroke [facility]) 2007   mini stroke or TIA with aphasia     Past Surgical History:  Procedure Laterality Date  . CATARACT EXTRACTION  2009   ou  . FEMUR FRACTURE SURGERY  8/12  . left knee  2001  . REPLACEMENT TOTAL KNEE BILATERAL Right 1999  . right intertrochanteric hip fracture status post ORIF  August 2012  . right shoulder replacement  2009     Medications: Current Outpatient Prescriptions  Medication Sig Dispense Refill  . alclomethasone (ACLOVATE) 0.05 % cream Apply 1 application topically as needed (rash/redness).     Marland Kitchen amiodarone (PACERONE) 200 MG tablet Take 0.5 tablets (100 mg total) by mouth daily. 30 tablet 11  . Cholecalciferol (VITAMIN D-3 PO) Take 1 capsule by mouth daily.    . Cyanocobalamin (VITAMIN B-12 PO) Take 1 tablet by mouth daily.    Marland Kitchen  furosemide (LASIX) 40 MG tablet Take 1 tablet (40 mg total) by mouth daily. Extra 20 mg prn weight gain of 2 pounds overnight. 30 tablet 9  . Melatonin 3 MG TABS Take 6 mg by mouth at bedtime as needed (for sleep).    . potassium chloride 20 MEQ TBCR Take 10 mEq by mouth daily. 30 tablet 3  . RESTASIS 0.05 % ophthalmic emulsion Place 1 drop into both eyes 2 (two) times daily.    . rivastigmine (EXELON) 3 MG capsule Take 1 capsule (3 mg total) by mouth 2 (two) times daily. 60 capsule 5  . traMADol (ULTRAM) 50 MG tablet Take 50 mg by mouth 2 (two) times daily as needed (for pain). Reported on 01/22/2016    . VOLTAREN 1 % GEL Apply 1 application topically daily as needed (for  leg pain).   3  . XARELTO 20 MG TABS tablet Take 1 tablet by mouth nightly  11  . metoprolol succinate (TOPROL XL) 25 MG 24 hr tablet Take 1 tablet (25 mg total) by mouth daily. 30 tablet 11   No current facility-administered medications for this visit.     Allergies: Allergies  Allergen Reactions  . Codeine Nausea Only    Social History: The patient  reports that she has never smoked. She has never used smokeless tobacco. She reports that she drinks about 4.2 oz of alcohol per week . She reports that she does not use drugs.   Family History: The patient's family history includes Cancer in her father, sister, sister, and son; Cancer - Colon in her father; Dementia in her mother; Stroke in her mother.   Review of Systems: Please see the history of present illness.   Otherwise, the review of systems is positive for none.   All other systems are reviewed and negative.   Physical Exam: VS:  BP (!) 170/70   Pulse 86   Ht 4\' 11"  (1.499 m)   Wt 122 lb 12.8 oz (55.7 kg)   BMI 24.80 kg/m  .  BMI Body mass index is 24.8 kg/m.  Wt Readings from Last 3 Encounters:  08/04/16 122 lb 12.8 oz (55.7 kg)  07/13/16 122 lb (55.3 kg)  06/29/16 121 lb 12.8 oz (55.2 kg)   Repeat BP by me is 150/70.  General: Pleasant. Elderly female who is alert and in no acute distress. She is using a cane.  HEENT: Normal.  Neck: Supple, no JVD, carotid bruits, or masses noted.  Cardiac: Regular rate and rhythm. No murmurs, rubs, or gallops. No edema.  Respiratory:  Lungs are clear to auscultation bilaterally with normal work of breathing.  GI: Soft and nontender.  MS: No deformity or atrophy. Gait and ROM intact.  Skin: Warm and dry. Color is normal.  Neuro:  Strength and sensation are intact and no gross focal deficits noted.  Psych: Alert, appropriate and with normal affect.   LABORATORY DATA:  EKG:  EKG is ordered today. This demonstrates sinus bradycardia. Her rate is 51.   Lab Results    Component Value Date   WBC 5.4 06/29/2016   HGB 13.3 06/29/2016   HCT 39.8 06/29/2016   PLT 140 06/29/2016   GLUCOSE 102 (H) 06/29/2016   ALT 27 06/29/2016   AST 27 06/29/2016   NA 139 06/29/2016   K 3.9 06/29/2016   CL 96 (L) 06/29/2016   CREATININE 1.06 (H) 06/29/2016   BUN 35 (H) 06/29/2016   CO2 34 (H) 06/29/2016   TSH 1.49  06/29/2016   INR 1.26 05/26/2016    BNP (last 3 results)  Recent Labs  05/26/16 1425 06/29/16 1606  BNP 702.6* 322.5*    ProBNP (last 3 results) No results for input(s): PROBNP in the last 8760 hours.   Other Studies Reviewed Today:  Echo Study Conclusions from 03/2015  - Left ventricle: The cavity size was normal. Wall thickness was increased in a pattern of mild LVH. Systolic function was normal. The estimated ejection fraction was in the range of 55% to 60%. Doppler parameters are consistent with abnormal left ventricular relaxation (grade 1 diastolic dysfunction). - Aortic valve: There was mild regurgitation. - Mitral valve: Calcified annulus. Mildly thickened leaflets . There was mild regurgitation. - Atrial septum: No defect or patent foramen ovale was identified. - Pulmonary arteries: PA peak pressure: 35 mm Hg (S).    Lower Extremity Duplex Summary: No evidence of deep vein thrombosis involving the right common femoral vein and left lower extremity.  Other specific details can be found in the table(s) above. Prepared and Electronically Authenticated by  Gae Gallop MD 2016-09-02T14:05:48  Assessment/Plan: 1. Chronic diastolic HF - with recent exacerbation - I suspected this is due to recurrent AF with RVR - she looks better. EKG today shows she remains in NSR. Little more bradycardic. I am cutting her amiodarone back to just 100 mg a day. Cutting Metoprolol to 25 mg a day.  2. PAF - back in NSR by exam and by EKG today. She is more bradycardic today - not really symptomatic that I can tell. See #1.   3.  Chronic anticoagulation - no problems noted. I checked her creatinine clearance - it is 40 - confirmed with the pharmacist - needs to be on 15 mg of Xarelto - samples given and paper RX.   4. CKD - plan to recheck lab on return  5. Advanced age.   Current medicines are reviewed with the patient today.  The patient does not have concerns regarding medicines other than what has been noted above.  The following changes have been made:  See above.  Labs/ tests ordered today include:    Orders Placed This Encounter  Procedures  . EKG 12-Lead     Disposition:   FU with me in about 6 weeks with EKG  Patient is agreeable to this plan and will call if any problems develop in the interim.   Signed: Burtis Junes, RN, ANP-C 08/04/2016 3:02 PM  [facility] [address] [place], [place]  [address] Phone: [phone] Fax: [phone]

## 2016-08-11 ENCOUNTER — Ambulatory Visit (INDEPENDENT_AMBULATORY_CARE_PROVIDER_SITE_OTHER): Payer: Medicare Other | Admitting: Neurology

## 2016-08-11 ENCOUNTER — Encounter: Payer: Self-pay | Admitting: Neurology

## 2016-08-11 VITALS — BP 115/65 | HR 56 | Ht 59.0 in | Wt 122.5 lb

## 2016-08-11 DIAGNOSIS — G2581 Restless legs syndrome: Secondary | ICD-10-CM | POA: Diagnosis not present

## 2016-08-11 DIAGNOSIS — R413 Other amnesia: Secondary | ICD-10-CM

## 2016-08-11 DIAGNOSIS — R350 Frequency of micturition: Secondary | ICD-10-CM | POA: Diagnosis not present

## 2016-08-11 HISTORY — DX: Restless legs syndrome: G25.81

## 2016-08-11 MED ORDER — RIVASTIGMINE TARTRATE 3 MG PO CAPS: 3.0000 mg | ORAL_CAPSULE | Freq: Two times a day (BID) | ORAL | 5 refills | Status: DC

## 2016-08-11 MED ORDER — PRAMIPEXOLE DIHYDROCHLORIDE 0.125 MG PO TABS: 0.1250 mg | ORAL_TABLET | Freq: Every day | ORAL | 3 refills | Status: DC

## 2016-08-11 NOTE — Patient Instructions (Addendum)
Restless Legs Syndrome Restless legs syndrome is a condition that causes uncomfortable feelings or sensations in the legs, especially while sitting or lying down. The sensations usually cause an overwhelming urge to move the legs. The arms can also sometimes be affected. The condition can range from mild to severe. The symptoms often interfere with a person's ability to sleep. CAUSES The cause of this condition is not known. RISK FACTORS This condition is more likely to develop in:  People who are older than age 50.  Pregnant women. In general, restless legs syndrome is more common in women than in men.  People who have a family history of the condition.  People who have certain medical conditions, such as iron deficiency, kidney disease, Parkinson disease, or nerve damage.  People who take certain medicines, such as medicines for high blood pressure, nausea, colds, allergies, depression, and some heart conditions. SYMPTOMS The main symptom of this condition is uncomfortable sensations in the legs. These sensations may be:  Described as pulling, tingling, prickling, throbbing, crawling, or burning.  Worse while you are sitting or lying down.  Worse during periods of rest or inactivity.  Worse at night, often interfering with your sleep.  Accompanied by a very strong urge to move your legs.  Temporarily relieved by movement of your legs. The sensations usually affect both sides of the body. The arms can also be affected, but this is rare. People who have this condition often have tiredness during the day because of their lack of sleep at night. DIAGNOSIS This condition may be diagnosed based on your description of the symptoms. You may also have tests, including blood tests, to check for other conditions that may lead to your symptoms. In some cases, you may be asked to spend some time in a sleep lab so your sleeping can be monitored. TREATMENT Treatment for this condition is  focused on managing the symptoms. Treatment may include:  Self-help and lifestyle changes.  Medicines. HOME CARE INSTRUCTIONS  Take medicines only as directed by your health care provider.  Try these methods to get temporary relief from the uncomfortable sensations:  Massage your legs.  Walk or stretch.  Take a cold or hot bath.  Practice good sleep habits. For example, go to bed and get up at the same time every day.  Exercise regularly.  Practice ways of relaxing, such as yoga or meditation.  Avoid caffeine and alcohol.  Do not use any tobacco products, including cigarettes, chewing tobacco, or electronic cigarettes. If you need help quitting, ask your health care provider.  Keep all follow-up visits as directed by your health care provider. This is important. SEEK MEDICAL CARE IF: Your symptoms do not improve with treatment, or they get worse.   This information is not intended to replace advice given to you by your health care provider. Make sure you discuss any questions you have with your health care provider.   Document Released: 10/09/2002 Document Revised: 03/05/2015 Document Reviewed: 10/15/2014 Elsevier Interactive Patient Education 2016 [employer]  

## 2016-08-11 NOTE — Progress Notes (Signed)
Reason for visit: Memory disturbance  [name] is an 82 y.o. female  History of present illness:  [name] is an 82 year old right-handed white female with a history of a mild memory problem, she was recently in the hospital around 05/26/2016 with congestive heart failure and atrial fibrillation. The patient reports that she is doing well with her memory, she is tolerating the Exelon capsules well. The patient has had problems with restless leg syndrome 3 or 4 nights a week, she oftentimes has to get up and walk to get rid of the symptoms. She also reports frequency of urination at night, she gets up 3-5 times each night to use the bathroom, she does not sleep well in part secondary to this. She takes melatonin that does allow her to sleep well for 2 or 3 hours initially. She comes to this office for an evaluation.  Past Medical History:  Diagnosis Date  . Arthritis    severe  . Atrial fibrillation ([place])   . Chronic diastolic CHF (congestive heart failure) ([place])   . Chronic insomnia   . Degenerative arthritis   . Essential hypertension   . Hypercholesteremia   . Memory deficit 05/24/2013  . Osteoporosis   . PAF (paroxysmal atrial fibrillation) ([place])    a. Intolerant to amiodarone, discontinued October 2012.  . Sinus bradycardia   . Stroke [facility]) 2007   mini stroke or TIA with aphasia     Past Surgical History:  Procedure Laterality Date  . CATARACT EXTRACTION  2009   ou  . FEMUR FRACTURE SURGERY  8/12  . left knee  2001  . REPLACEMENT TOTAL KNEE BILATERAL Right 1999  . right intertrochanteric hip fracture status post ORIF  August 2012  . right shoulder replacement  2009    Family History  Problem Relation Age of Onset  . Dementia Mother   . Stroke Mother   . Cancer - Colon Father   . Cancer Father   . Cancer Sister   . Cancer Son   . Cancer Sister   . Coronary artery disease      Social history:  reports that she has never smoked. She has never used smokeless  tobacco. She reports that she drinks about 4.2 oz of alcohol per week . She reports that she does not use drugs.    Allergies  Allergen Reactions  . Codeine Nausea Only    Medications:  Prior to Admission medications   Medication Sig Start Date End Date Taking? Authorizing Provider  alclomethasone (ACLOVATE) 0.05 % cream Apply 1 application topically as needed (rash/redness).  07/09/16   Historical Provider, MD  amiodarone (PACERONE) 200 MG tablet Take 0.5 tablets (100 mg total) by mouth daily. 08/04/16   Burtis Junes, NP  Cholecalciferol (VITAMIN D-3 PO) Take 1 capsule by mouth daily.    Historical Provider, MD  Cyanocobalamin (VITAMIN B-12 PO) Take 1 tablet by mouth daily.    Historical Provider, MD  furosemide (LASIX) 40 MG tablet Take 1 tablet (40 mg total) by mouth daily. Extra 20 mg prn weight gain of 2 pounds overnight. 07/13/16 10/11/16  Burtis Junes, NP  Melatonin 3 MG TABS Take 6 mg by mouth at bedtime as needed (for sleep).    Historical Provider, MD  metoprolol succinate (TOPROL XL) 25 MG 24 hr tablet Take 1 tablet (25 mg total) by mouth daily. 08/04/16   Burtis Junes, NP  potassium chloride 20 MEQ TBCR Take 10 mEq by mouth  daily. 05/27/16   Venetia Maxon Rama, MD  RESTASIS 0.05 % ophthalmic emulsion Place 1 drop into both eyes 2 (two) times daily. 03/24/16   Historical Provider, MD  Rivaroxaban (XARELTO) 15 MG TABS tablet Take 1 tablet (15 mg total) by mouth daily with supper. 08/04/16   Burtis Junes, NP  rivastigmine (EXELON) 3 MG capsule Take 1 capsule (3 mg total) by mouth 2 (two) times daily. 04/01/16   Kathrynn Ducking, MD  traMADol (ULTRAM) 50 MG tablet Take 50 mg by mouth 2 (two) times daily as needed (for pain). Reported on 01/22/2016 12/01/11   Historical Provider, MD  VOLTAREN 1 % GEL Apply 1 application topically daily as needed (for leg pain).  11/09/14   Historical Provider, MD    ROS:  Out of a complete 14 system review of symptoms, the patient complains only of  the following symptoms, and all other reviewed systems are negative.  Fatigue Runny nose, trouble swallowing Light sensitivity Wheezing, shortness of breath Heat intolerance, flushing Restless legs, frequent waking, snoring Environmental allergies Joint pain, joint swelling, walking difficulty, neck stiffness Bruising easily  Blood pressure 115/65, pulse (!) 56, height 4\' 11"  (1.499 m), weight 122 lb 8 oz (55.6 kg).  Physical Exam  General: The patient is alert and cooperative at the time of the examination.  Skin: 2+ edema below the knees is noted bilaterally.   Neurologic Exam  Mental status: The patient is alert and oriented x 3 at the time of the examination. The patient has apparent normal recent and remote memory, with an apparently normal attention span and concentration ability. MMSE score is 29/30.   Cranial nerves: Facial symmetry is present. Speech is normal, no aphasia or dysarthria is noted. Extraocular movements are full. Visual fields are full.  Motor: The patient has good strength in all 4 extremities.  Sensory examination: Soft touch sensation is symmetric on the face, arms, and legs.  Coordination: The patient has good finger-nose-finger and heel-to-shin bilaterally.  Gait and station: The patient has a slightly wide-based gait. The patient walks with a walker.  Reflexes: Deep tendon reflexes are symmetric, but are depressed.   Assessment/Plan:  1. Mild memory disturbance  2. Restless leg syndrome  3. Urinary frequency  The patient will be sent for a urology consultation or the urinary frequency at night. The patient will continue the Exelon, a prescription was called in. A low dose of Mirapex will be called in for the restless leg syndrome which may also be a factor in her difficulty with sleeping. She will follow-up in 6 months.  Jill Alexanders MD 08/11/2016 4:18 PM  [facility] [address] [place], [place]  [address]  Phone [phone] Fax [phone]

## 2016-08-20 ENCOUNTER — Other Ambulatory Visit: Payer: Self-pay | Admitting: Nurse Practitioner

## 2016-08-20 NOTE — Telephone Encounter (Signed)
metoprolol succinate (TOPROL XL) 25 MG 24 hr tablet  Medication  Date: 08/04/2016 Department: [address] Office Ordering/Authorizing: Burtis Junes, NP  Order Providers   Prescribing Provider Encounter Provider  Burtis Junes, NP Burtis Junes, NP  Supervision Information   Supervising Provider Type of Supervision  Peter M Martinique, MD Incident To  Medication Detail    Disp Refills Start End   metoprolol succinate (TOPROL XL) 25 MG 24 hr tablet 30 tablet 11 08/04/2016    Sig - Route: Take 1 tablet (25 mg total) by mouth daily. - Oral   E-Prescribing Status: Receipt confirmed by pharmacy (08/04/2016 3:01 PM EDT)   Pharmacy   [id number] - [place], [address] AT [address] [place]

## 2016-08-24 ENCOUNTER — Telehealth: Payer: Self-pay | Admitting: Nurse Practitioner

## 2016-08-24 MED ORDER — AMIODARONE HCL 100 MG PO TABS: ORAL_TABLET | ORAL | 6 refills | Status: DC

## 2016-08-24 NOTE — Telephone Encounter (Signed)
LMTCB for pt 

## 2016-08-24 NOTE — Telephone Encounter (Signed)
RX sent into pharmacy

## 2016-08-24 NOTE — Telephone Encounter (Signed)
New message    Pt verbalized that lori needs to write her a new prescription because the strength has been changed  Pt c/o medication issue:  1. Name of Medication: Amedoridone  2. How are you currently taking this medication (dosage and times per day)? 100mg  instead of 200mg 1xday  3. Are you having a reaction (difficulty breathing--STAT)? no  4. What is your medication issue? New prescription 100mg  instead of 200mg 

## 2016-08-25 NOTE — Telephone Encounter (Signed)
Called patient back to inform amiodarone has been sent to phamarcy (100 mg tablets).  Pt stated she had hoped to talk to [facility], and she has been having some new symptoms, chest pressure, and wanted to clarify the dose of her metoprolol.  Confirmed dose of metoprolol is 25 mg once a day now.  Her doorbell rang and it was a [employer] information [employer] manager to discuss her Part D benefits and so she has to hang up and call back. She had hoped to have this addressed before he got there.   I asked her to answer the door and I would stay ion the phone so we could discuss her chest pressure further.    Pt states it is so hard to get through to anyone at this office and she guesses she will call back if it keeps up and hung up.

## 2016-08-25 NOTE — Telephone Encounter (Signed)
Follow Up:   Returning [name]'s call from yesterday.

## 2016-08-26 NOTE — Telephone Encounter (Signed)
I called - had to leave her a message - will be happy to see back sooner if needed.

## 2016-08-26 NOTE — Telephone Encounter (Signed)
Feels like elephant sitting on chest does have sob but gets moving around feels better.  Wants to know if it has anything to do with medication changes. Doesn't think its an emergency but wants to let you know and if you think she should come in. Stated would send this to you and would call back if necessary. Also stated if has to call 911 or go to ER if it gets emergent.

## 2016-08-27 MED ORDER — NITROGLYCERIN 0.4 MG SL SUBL: 0.4000 mg | SUBLINGUAL_TABLET | SUBLINGUAL | 1 refills | Status: DC | PRN

## 2016-08-27 NOTE — Telephone Encounter (Signed)
S/w pt is agreeable to treatment plan.  Pt was given detailed instructions on how to use nitroglycerin. Sent in to pt's requested pharmacy. Pt will come in on Wednesday, November 1 for appointment.  Pt will call Monday if cannot keep appointment and has more issues over the weekend.

## 2016-08-27 NOTE — Telephone Encounter (Signed)
Call her in some NTG - needs to sit or lie down if takes - Use NTG under the tongue for chest pain. May take one tablet every 5 minutes. If you are still having discomfort after 3 tablets in 15 minutes, call 911.  I need to see her next week.

## 2016-09-01 DIAGNOSIS — B078 Other viral warts: Secondary | ICD-10-CM | POA: Diagnosis not present

## 2016-09-01 DIAGNOSIS — D2239 Melanocytic nevi of other parts of face: Secondary | ICD-10-CM | POA: Diagnosis not present

## 2016-09-01 DIAGNOSIS — L821 Other seborrheic keratosis: Secondary | ICD-10-CM | POA: Diagnosis not present

## 2016-09-01 NOTE — Progress Notes (Signed)
CARDIOLOGY OFFICE NOTE  Date:  09/02/2016    [name] Date of Birth: September 24, 1928 Medical Record #[id number]  PCP:  Mathews Argyle, MD  Cardiologist:  Atilano Median    Chief Complaint  Patient presents with  . Chest Pain    Work in visit - seen for Dr. Lovena Le    History of Present Illness: MACIEL KEGG is a 82 y.o. female who presents today for a work in visit. Seen for Dr. Lovena Le. Former patient of Dr. Susa Simmonds. I previously took care of her husband [name] for many years.   She has a history of paroxysmal atrial fibrillation, prior stroke, dyslipidemia, hypertension, and diastolic heart failure. The patient has rheumatoid arthritis. Her atrial arrhythmias have beenwell-controlled. She remains on chronic anticoagulation.   Last seen by Dr. Lovena Le back in May of 2016 - felt to be doing ok from our standpoint.  I saw her back last September - lots more swelling in her legs - worse after being switched over to Verapamil. Good EF by echo.   She was hospitalized back in July of 2017 with acute on chronic diastolic HF. HR initially elevated but improved with metoprolol. She was in atrial fib - looks to me like this was probably the trigger for her demise. Looks like itwas assumed she hadchronic AF - however she was in NSR on EKG last year. Now on Xarelto for her anticoagulation. Metoprolol dose was increased along with her Lasix.   I saw her back for follow up - I felt like her AF was the probable trigger for recent exacerbation. Discussed with Dr. Lovena Le and we elected to place her on amiodarone therapy - low dose. She has done ok - we have been able to cut her beta blocker back due to bradycardia.   Phone call last week - had trouble getting thru to the office. Endorsing more chest pain. Thus added back to my schedule for today for evaluation.   Comes in today. Here alone. She has a friend that drives her here. She continues to have this chest pressure/tightness -  always happens at night. Never during the day. Getting more frequent. Feels like "something is stuck". She does not feel like this is indigestion. Not belching or burping. She already sleeps propped up due to her "bad shoulders". She has lots of arthritis. Never notes this discomfort during the day or when she is active.   Past Medical History:  Diagnosis Date  . Arthritis    severe  . Atrial fibrillation ([place])   . Chronic diastolic CHF (congestive heart failure) ([place])   . Chronic insomnia   . Degenerative arthritis   . Essential hypertension   . Hypercholesteremia   . Memory deficit 05/24/2013  . Osteoporosis   . PAF (paroxysmal atrial fibrillation) ([place])    a. Intolerant to amiodarone, discontinued October 2012.  Marland Kitchen RLS (restless legs syndrome) 08/11/2016  . Sinus bradycardia   . Stroke [facility]) 2007   mini stroke or TIA with aphasia     Past Surgical History:  Procedure Laterality Date  . CATARACT EXTRACTION  2009   ou  . FEMUR FRACTURE SURGERY  8/12  . left knee  2001  . REPLACEMENT TOTAL KNEE BILATERAL Right 1999  . right intertrochanteric hip fracture status post ORIF  August 2012  . right shoulder replacement  2009     Medications: Current Outpatient Prescriptions  Medication Sig Dispense Refill  . alclomethasone (ACLOVATE) 0.05 % cream Apply 1  application topically as needed (rash/redness).     Marland Kitchen amiodarone (PACERONE) 100 MG tablet Take one tablet (100 mg) by mouth once daily 30 tablet 6  . Cholecalciferol (VITAMIN D-3 PO) Take 1 capsule by mouth daily.    . Cyanocobalamin (VITAMIN B-12 PO) Take 1 tablet by mouth daily.    . furosemide (LASIX) 40 MG tablet Take 1 tablet (40 mg total) by mouth daily. Extra 20 mg prn weight gain of 2 pounds overnight. 30 tablet 9  . Melatonin 3 MG TABS Take 6 mg by mouth at bedtime as needed (for sleep).    . metoprolol succinate (TOPROL XL) 25 MG 24 hr tablet Take 1 tablet (25 mg total) by mouth daily. 30 tablet 11  . nitroGLYCERIN  (NITROSTAT) 0.4 MG SL tablet Place 1 tablet (0.4 mg total) under the tongue every 5 (five) minutes as needed for chest pain. 25 tablet 1  . potassium chloride 20 MEQ TBCR Take 10 mEq by mouth daily. 30 tablet 3  . pramipexole (MIRAPEX) 0.125 MG tablet Take 1 tablet (0.125 mg total) by mouth at bedtime. 30 tablet 3  . RESTASIS 0.05 % ophthalmic emulsion Place 1 drop into both eyes 2 (two) times daily.    . Rivaroxaban (XARELTO) 15 MG TABS tablet Take 1 tablet (15 mg total) by mouth daily with supper. 30 tablet 6  . rivastigmine (EXELON) 3 MG capsule Take 1 capsule (3 mg total) by mouth 2 (two) times daily. 60 capsule 5  . traMADol (ULTRAM) 50 MG tablet Take 50 mg by mouth 2 (two) times daily as needed (for pain). Reported on 01/22/2016    . VOLTAREN 1 % GEL Apply 1 application topically daily as needed (for leg pain).   3   No current facility-administered medications for this visit.     Allergies: Allergies  Allergen Reactions  . Codeine Nausea Only    Social History: The patient  reports that she has never smoked. She has never used smokeless tobacco. She reports that she drinks about 4.2 oz of alcohol per week . She reports that she does not use drugs.   Family History: The patient's family history includes Cancer in her father, sister, sister, and son; Cancer - Colon in her father; Dementia in her mother; Stroke in her mother.   Review of Systems: Please see the history of present illness.   Otherwise, the review of systems is positive for none.   All other systems are reviewed and negative.   Physical Exam: VS:  BP (!) 160/60   Pulse (!) 57   Ht 4\' 11"  (1.499 m)   Wt 123 lb (55.8 kg)   BMI 24.84 kg/m  .  BMI Body mass index is 24.84 kg/m.  Wt Readings from Last 3 Encounters:  09/02/16 123 lb (55.8 kg)  08/11/16 122 lb 8 oz (55.6 kg)  08/04/16 122 lb 12.8 oz (55.7 kg)    General: Pleasant. Elderly female who is alert and in no acute distress.  She is kyphotic. Using a  cane.  HEENT: Normal.  Neck: Supple, no JVD, carotid bruits, or masses noted.  Cardiac: Regular rate and rhythm. No murmurs, rubs, or gallops. No edema.  Respiratory:  Lungs are clear to auscultation bilaterally with normal work of breathing.  GI: Soft and nontender.  MS: No deformity or atrophy. Gait and ROM intact.  Skin: Warm and dry. Color is normal.  Neuro:  Strength and sensation are intact and no gross focal deficits noted.  Psych:  Alert, appropriate and with normal affect.   LABORATORY DATA:  EKG:  EKG is ordered today. This demonstrates sinus brady - rate is 57.  Lab Results  Component Value Date   WBC 5.4 06/29/2016   HGB 13.3 06/29/2016   HCT 39.8 06/29/2016   PLT 140 06/29/2016   GLUCOSE 102 (H) 06/29/2016   ALT 27 06/29/2016   AST 27 06/29/2016   NA 139 06/29/2016   K 3.9 06/29/2016   CL 96 (L) 06/29/2016   CREATININE 1.06 (H) 06/29/2016   BUN 35 (H) 06/29/2016   CO2 34 (H) 06/29/2016   TSH 1.49 06/29/2016   INR 1.26 05/26/2016    BNP (last 3 results)  Recent Labs  05/26/16 1425 06/29/16 1606  BNP 702.6* 322.5*    ProBNP (last 3 results) No results for input(s): PROBNP in the last 8760 hours.   Other Studies Reviewed Today:  Echo Study Conclusions from 03/2015  - Left ventricle: The cavity size was normal. Wall thickness was increased in a pattern of mild LVH. Systolic function was normal. The estimated ejection fraction was in the range of 55% to 60%. Doppler parameters are consistent with abnormal left ventricular relaxation (grade 1 diastolic dysfunction). - Aortic valve: There was mild regurgitation. - Mitral valve: Calcified annulus. Mildly thickened leaflets . There was mild regurgitation. - Atrial septum: No defect or patent foramen ovale was identified. - Pulmonary arteries: PA peak pressure: 35 mm Hg (S).    Lower Extremity Duplex Summary: No evidence of deep vein thrombosis involving the right common femoral vein  and left lower extremity.  Other specific details can be found in the table(s) above. Prepared and Electronically Authenticated by  Gae Gallop MD 2016-09-02T14:05:48  Assessment/Plan: 1. Chest pain - will try to manage conservatively. Hard to say if this is cardiac, GI or due to arthritis. She wants to hold off on adding Imdur - she is going to try NTG - may need to take 2. She is going to try her Tramadol. She will also then do a trial of Zantac. If this fails - she says she may be agreeable to Imdur. Will tentatively see back as planned.   2. Chronic diastolic HF - with recent exacerbation - I suspected this is due to recurrent AF with RVR - she looks better. EKG today shows she remains in NSR/brady. Only on amiodarone back to just 100 mg a day and Metoprolol 25 mg a day.  3. PAF - back in NSR by exam and by EKG today.  4. Chronic anticoagulation - no problems noted. I checked her creatinine clearance at last visit - it is 42 - confirmed with the pharmacist - needs to be on 15 mg of Xarelto - her dose has been changed.   5. CKD   6. Advanced age.   Current medicines are reviewed with the patient today.  The patient does not have concerns regarding medicines other than what has been noted above.  The following changes have been made:  See above.  Labs/ tests ordered today include:    Orders Placed This Encounter  Procedures  . EKG 12-Lead     Disposition:   FU with me as planned.   Patient is agreeable to this plan and will call if any problems develop in the interim.   Signed: Burtis Junes, RN, ANP-C 09/02/2016 2:41 PM  [facility] [address] [place], [place]  [address] Phone: [phone] Fax: [phone]

## 2016-09-02 ENCOUNTER — Encounter (INDEPENDENT_AMBULATORY_CARE_PROVIDER_SITE_OTHER): Payer: Self-pay

## 2016-09-02 ENCOUNTER — Ambulatory Visit (INDEPENDENT_AMBULATORY_CARE_PROVIDER_SITE_OTHER): Payer: Medicare Other | Admitting: Nurse Practitioner

## 2016-09-02 ENCOUNTER — Encounter: Payer: Self-pay | Admitting: Nurse Practitioner

## 2016-09-02 VITALS — BP 160/60 | HR 57 | Ht 59.0 in | Wt 123.0 lb

## 2016-09-02 DIAGNOSIS — I5032 Chronic diastolic (congestive) heart failure: Secondary | ICD-10-CM

## 2016-09-02 DIAGNOSIS — I480 Paroxysmal atrial fibrillation: Secondary | ICD-10-CM

## 2016-09-02 DIAGNOSIS — R072 Precordial pain: Secondary | ICD-10-CM

## 2016-09-02 DIAGNOSIS — Z79899 Other long term (current) drug therapy: Secondary | ICD-10-CM | POA: Diagnosis not present

## 2016-09-02 NOTE — Patient Instructions (Addendum)
We will be checking the following labs today - NONE   Medication Instructions:    Continue with your current medicines. BUt  I want you to continue to try the NTG if needed for chest discomfort  I would like for you to try using your Tramadol for the chest discomfort - if this does not work - try taking your Zantac before bedtime for a week or so.    Testing/Procedures To Be Arranged:  N/A  Follow-Up:   See me back as planned - call if you are doing better and we can change it to later     Other Special Instructions:   N/A    If you need a refill on your cardiac medications before your next appointment, please call your pharmacy.   Call the [place] office at [phone] if you have any questions, problems or concerns.

## 2016-09-28 ENCOUNTER — Ambulatory Visit: Payer: Medicare Other | Admitting: Nurse Practitioner

## 2016-09-28 NOTE — Progress Notes (Deleted)
CARDIOLOGY OFFICE NOTE  Date:  09/28/2016    [name] Date of Birth: 07-08-1928 Medical Record #[id number]  PCP:  Mathews Argyle, MD  Cardiologist:  Servando Snare & ***    No chief complaint on file.   History of Present Illness: [name] is a 82 y.o. female who presents today for a ***   Comes in today. Here with   Past Medical History:  Diagnosis Date  . Arthritis    severe  . Atrial fibrillation ([place])   . Chronic diastolic CHF (congestive heart failure) ([place])   . Chronic insomnia   . Degenerative arthritis   . Essential hypertension   . Hypercholesteremia   . Memory deficit 05/24/2013  . Osteoporosis   . PAF (paroxysmal atrial fibrillation) ([place])    a. Intolerant to amiodarone, discontinued October 2012.  Marland Kitchen RLS (restless legs syndrome) 08/11/2016  . Sinus bradycardia   . Stroke [facility]) 2007   mini stroke or TIA with aphasia     Past Surgical History:  Procedure Laterality Date  . CATARACT EXTRACTION  2009   ou  . FEMUR FRACTURE SURGERY  8/12  . left knee  2001  . REPLACEMENT TOTAL KNEE BILATERAL Right 1999  . right intertrochanteric hip fracture status post ORIF  August 2012  . right shoulder replacement  2009     Medications: Current Outpatient Prescriptions  Medication Sig Dispense Refill  . alclomethasone (ACLOVATE) 0.05 % cream Apply 1 application topically as needed (rash/redness).     Marland Kitchen amiodarone (PACERONE) 100 MG tablet Take one tablet (100 mg) by mouth once daily 30 tablet 6  . Cholecalciferol (VITAMIN D-3 PO) Take 1 capsule by mouth daily.    . Cyanocobalamin (VITAMIN B-12 PO) Take 1 tablet by mouth daily.    . furosemide (LASIX) 40 MG tablet Take 1 tablet (40 mg total) by mouth daily. Extra 20 mg prn weight gain of 2 pounds overnight. 30 tablet 9  . Melatonin 3 MG TABS Take 6 mg by mouth at bedtime as needed (for sleep).    . metoprolol succinate (TOPROL XL) 25 MG 24 hr tablet Take 1 tablet (25 mg total) by mouth daily. 30 tablet 11    . nitroGLYCERIN (NITROSTAT) 0.4 MG SL tablet Place 1 tablet (0.4 mg total) under the tongue every 5 (five) minutes as needed for chest pain. 25 tablet 1  . potassium chloride 20 MEQ TBCR Take 10 mEq by mouth daily. 30 tablet 3  . pramipexole (MIRAPEX) 0.125 MG tablet Take 1 tablet (0.125 mg total) by mouth at bedtime. 30 tablet 3  . RESTASIS 0.05 % ophthalmic emulsion Place 1 drop into both eyes 2 (two) times daily.    . Rivaroxaban (XARELTO) 15 MG TABS tablet Take 1 tablet (15 mg total) by mouth daily with supper. 30 tablet 6  . rivastigmine (EXELON) 3 MG capsule Take 1 capsule (3 mg total) by mouth 2 (two) times daily. 60 capsule 5  . traMADol (ULTRAM) 50 MG tablet Take 50 mg by mouth 2 (two) times daily as needed (for pain). Reported on 01/22/2016    . VOLTAREN 1 % GEL Apply 1 application topically daily as needed (for leg pain).   3   No current facility-administered medications for this visit.     Allergies: Allergies  Allergen Reactions  . Codeine Nausea Only    Social History: The patient  reports that she has never smoked. She has never used smokeless tobacco. She reports that she  drinks about 4.2 oz of alcohol per week . She reports that she does not use drugs.   Family History: The patient's ***family history includes Cancer in her father, sister, sister, and son; Cancer - Colon in her father; Dementia in her mother; Stroke in her mother.   Review of Systems: Please see the history of present illness.   Otherwise, the review of systems is positive for {NONE DEFAULTED:18576::"none"}.   All other systems are reviewed and negative.   Physical Exam: VS:  There were no vitals taken for this visit. Marland Kitchen  BMI There is no height or weight on file to calculate BMI.  Wt Readings from Last 3 Encounters:  09/02/16 123 lb (55.8 kg)  08/11/16 122 lb 8 oz (55.6 kg)  08/04/16 122 lb 12.8 oz (55.7 kg)    General: Pleasant. Well developed, well nourished and in no acute distress.   HEENT:  Normal.  Neck: Supple, no JVD, carotid bruits, or masses noted.  Cardiac: ***Regular rate and rhythm. No murmurs, rubs, or gallops. No edema.  Respiratory:  Lungs are clear to auscultation bilaterally with normal work of breathing.  GI: Soft and nontender.  MS: No deformity or atrophy. Gait and ROM intact.  Skin: Warm and dry. Color is normal.  Neuro:  Strength and sensation are intact and no gross focal deficits noted.  Psych: Alert, appropriate and with normal affect.   LABORATORY DATA:  EKG:  EKG {ACTION; IS/IS XBM:84132440} ordered today. This demonstrates ***.  Lab Results  Component Value Date   WBC 5.4 06/29/2016   HGB 13.3 06/29/2016   HCT 39.8 06/29/2016   PLT 140 06/29/2016   GLUCOSE 102 (H) 06/29/2016   ALT 27 06/29/2016   AST 27 06/29/2016   NA 139 06/29/2016   K 3.9 06/29/2016   CL 96 (L) 06/29/2016   CREATININE 1.06 (H) 06/29/2016   BUN 35 (H) 06/29/2016   CO2 34 (H) 06/29/2016   TSH 1.49 06/29/2016   INR 1.26 05/26/2016    BNP (last 3 results)  Recent Labs  05/26/16 1425 06/29/16 1606  BNP 702.6* 322.5*    ProBNP (last 3 results) No results for input(s): PROBNP in the last 8760 hours.   Other Studies Reviewed Today:   Assessment/Plan:   Current medicines are reviewed with the patient today.  The patient does not have concerns regarding medicines other than what has been noted above.  The following changes have been made:  See above.  Labs/ tests ordered today include:   No orders of the defined types were placed in this encounter.    Disposition:   FU with *** in {gen number 1-02:725366} {Days to years:10300}.   Patient is agreeable to this plan and will call if any problems develop in the interim.   Signed: Burtis Junes, RN, ANP-C 09/28/2016 7:42 AM  [facility] [address] [place], [place]  [address] Phone: [phone] Fax: [phone]

## 2016-10-23 DIAGNOSIS — J069 Acute upper respiratory infection, unspecified: Secondary | ICD-10-CM | POA: Diagnosis not present

## 2016-10-23 DIAGNOSIS — R05 Cough: Secondary | ICD-10-CM | POA: Diagnosis not present

## 2016-10-28 ENCOUNTER — Encounter: Payer: Medicare Other | Admitting: Podiatry

## 2016-10-30 NOTE — Progress Notes (Signed)
This encounter was created in error - please disregard.

## 2016-11-06 ENCOUNTER — Ambulatory Visit (INDEPENDENT_AMBULATORY_CARE_PROVIDER_SITE_OTHER): Payer: Medicare Other

## 2016-11-06 ENCOUNTER — Encounter: Payer: Self-pay | Admitting: Podiatry

## 2016-11-06 ENCOUNTER — Other Ambulatory Visit: Payer: Self-pay | Admitting: Podiatry

## 2016-11-06 ENCOUNTER — Ambulatory Visit (INDEPENDENT_AMBULATORY_CARE_PROVIDER_SITE_OTHER): Payer: Medicare Other | Admitting: Podiatry

## 2016-11-06 DIAGNOSIS — M79671 Pain in right foot: Secondary | ICD-10-CM | POA: Diagnosis not present

## 2016-11-06 DIAGNOSIS — Z0189 Encounter for other specified special examinations: Secondary | ICD-10-CM

## 2016-11-06 DIAGNOSIS — M79672 Pain in left foot: Secondary | ICD-10-CM | POA: Diagnosis not present

## 2016-11-06 DIAGNOSIS — L84 Corns and callosities: Secondary | ICD-10-CM | POA: Diagnosis not present

## 2016-11-06 DIAGNOSIS — M2041 Other hammer toe(s) (acquired), right foot: Secondary | ICD-10-CM | POA: Diagnosis not present

## 2016-11-06 NOTE — Progress Notes (Signed)
Chief Complaint  Patient presents with  . Toe Pain    Right foot, 2nd toe; hammer toe. Pt wants to know if the toe could be repaired.   . Foot Pain    Right heel; worse since last visit. Pt states that it feels as if pins and needles are in her foot. Gabapentin 300 MG helps, she is wondering if she can take it more than once a day.

## 2016-11-09 ENCOUNTER — Telehealth: Payer: Self-pay | Admitting: *Deleted

## 2016-11-09 NOTE — Progress Notes (Signed)
Subjective:     Patient ID: [name], female   DOB: 1928/09/02, 83 y.o.   MRN: [id number]  HPI patient presents with painful corn second digit right foot with rigid contracture of the digit and inability to wear shoe gear without difficulty   Review of Systems  All other systems reviewed and are negative.      Objective:   Physical Exam  Constitutional: She is oriented to person, place, and time.  Cardiovascular: Intact distal pulses.   Musculoskeletal: Normal range of motion.  Neurological: She is oriented to person, place, and time.  Skin: Skin is warm and dry.  Nursing note and vitals reviewed.  neurovascular status diminished both sharp [place] vibratory and PT DP pulses due to advanced age. Patient's found have rigid contracture digit 2 right with dorsal keratotic lesion with mild inflammation fluid buildup and pain when palpated. Patient's found to have good digital perfusion and she is well oriented     Assessment:     Rigid contracture digit 2 right with dorsal keratotic lesion that's painful    Plan:     H&P condition reviewed and debrided lesion and applied padding. Do not recommend surgery currently as I am concerned about age and healing keep ability but it may be necessary one point in future depending on how she responds. Reappoint as needed  X-ray indicates significant rigid contracture digit 2 right

## 2016-11-09 NOTE — Telephone Encounter (Addendum)
Pt's pharmacy request gabapentin rx. LOV 02/04/2015 show pt taking Gabapentin 300mg  at bedtime. LOV 11/06/2016 office medical office assistant note states pt asked if she could take Gabapentin more than at bedtime. 11/11/2016-Left message for pt to call about her medication. Dr. Paulla Dolly states pt may take up to 3 Gabapentin 300mg  a day, take 1 capsule 2 times a day for 1 week then if necessary may take up to 3 times a day. I reviewed pt's Medication & Orders and 11/06/2016 a PROVIDER, HISTORICAL filled the Gabapentin. Pt called and states Dr. Paulla Dolly said the Gabapentin was a safe medication and she could take it every night, when she discussed with him at last appt. Pt states she got the original rx 1 year ago and just finished the 30 doses. I asked pt is she wanted to increase the number of Gabapentin she takes a day and she said no, she had just wanted to know if it was a safe medication to take daily and Dr. Paulla Dolly had told her it was. I told her I would refill as Dr. Paulla Dolly wrote 1 year ago +5refills. Dr. Paulla Dolly was informed and okayed.

## 2016-11-11 MED ORDER — GABAPENTIN 300 MG PO CAPS: 300.0000 mg | ORAL_CAPSULE | Freq: Three times a day (TID) | ORAL | 5 refills | Status: DC

## 2016-11-11 NOTE — Telephone Encounter (Signed)
She can take up to 3 a day

## 2016-11-11 NOTE — Telephone Encounter (Signed)
Entered in error

## 2016-11-24 ENCOUNTER — Other Ambulatory Visit: Payer: Self-pay | Admitting: Internal Medicine

## 2016-11-26 ENCOUNTER — Other Ambulatory Visit: Payer: Self-pay | Admitting: *Deleted

## 2016-11-26 MED ORDER — FUROSEMIDE 40 MG PO TABS: 40.0000 mg | ORAL_TABLET | Freq: Every day | ORAL | 9 refills | Status: DC

## 2016-12-08 ENCOUNTER — Telehealth: Payer: Self-pay | Admitting: *Deleted

## 2016-12-08 ENCOUNTER — Other Ambulatory Visit: Payer: Self-pay | Admitting: *Deleted

## 2016-12-08 NOTE — Telephone Encounter (Signed)
Hard to say which medicine is the culprit - may be neither.   Would see dermatology.   Ok to stop her Metoprolol for now but stay on the amiodarone.  Needs a visit set up here for recheck/EKG/labs.  I have not heard of this other supplement.

## 2016-12-08 NOTE — Telephone Encounter (Signed)
Pt is calling in today due to Hair Loss.  Stated does [name] thinks it medication; amiodarone or metoprolol??  Is there a substitute medication pt can take?? Has [name] ever heard of Viviscal, come in pill form two daily or  Shampoo??  Pt does have a dermatologist but has not use doctor for this problem.  Will send to [name] to advise.

## 2016-12-08 NOTE — Telephone Encounter (Signed)
S/w pt is agreeable to treatment plan.  Will stop metoprolol for now, medication list updated, appointment made for February 20.  Pt will go to dermatologist.

## 2016-12-09 DIAGNOSIS — H01001 Unspecified blepharitis right upper eyelid: Secondary | ICD-10-CM | POA: Diagnosis not present

## 2016-12-09 DIAGNOSIS — H353132 Nonexudative age-related macular degeneration, bilateral, intermediate dry stage: Secondary | ICD-10-CM | POA: Diagnosis not present

## 2016-12-09 DIAGNOSIS — H04123 Dry eye syndrome of bilateral lacrimal glands: Secondary | ICD-10-CM | POA: Diagnosis not present

## 2016-12-09 DIAGNOSIS — H524 Presbyopia: Secondary | ICD-10-CM | POA: Diagnosis not present

## 2016-12-09 DIAGNOSIS — H52223 Regular astigmatism, bilateral: Secondary | ICD-10-CM | POA: Diagnosis not present

## 2016-12-09 DIAGNOSIS — H35033 Hypertensive retinopathy, bilateral: Secondary | ICD-10-CM | POA: Diagnosis not present

## 2016-12-09 DIAGNOSIS — H01002 Unspecified blepharitis right lower eyelid: Secondary | ICD-10-CM | POA: Diagnosis not present

## 2016-12-15 ENCOUNTER — Telehealth: Payer: Self-pay | Admitting: Nurse Practitioner

## 2016-12-15 NOTE — Telephone Encounter (Signed)
New message    Per pt having transportation issues and wants to know if it will be okay for her to come in the following week on Feb 28th. She was concerned that it may be too far out.Requesting call back

## 2016-12-15 NOTE — Telephone Encounter (Signed)
Pt will come next week and will call when pt gets in parking lot.  Will go down to parking lot with wheelchair to get pt. Will send to [place] to [place].

## 2016-12-15 NOTE — Telephone Encounter (Signed)
lvm for pt that the following week for f/u is to far out.  [name] wanted pt seen next week.

## 2016-12-18 NOTE — Telephone Encounter (Signed)
Thanks

## 2016-12-21 ENCOUNTER — Encounter: Payer: Self-pay | Admitting: Nurse Practitioner

## 2016-12-22 ENCOUNTER — Ambulatory Visit (INDEPENDENT_AMBULATORY_CARE_PROVIDER_SITE_OTHER): Payer: Medicare Other | Admitting: Nurse Practitioner

## 2016-12-22 ENCOUNTER — Encounter: Payer: Self-pay | Admitting: Nurse Practitioner

## 2016-12-22 ENCOUNTER — Encounter (INDEPENDENT_AMBULATORY_CARE_PROVIDER_SITE_OTHER): Payer: Self-pay

## 2016-12-22 VITALS — BP 132/60 | HR 80 | Ht 64.5 in | Wt 123.1 lb

## 2016-12-22 DIAGNOSIS — I5032 Chronic diastolic (congestive) heart failure: Secondary | ICD-10-CM | POA: Diagnosis not present

## 2016-12-22 DIAGNOSIS — Z79899 Other long term (current) drug therapy: Secondary | ICD-10-CM

## 2016-12-22 DIAGNOSIS — I480 Paroxysmal atrial fibrillation: Secondary | ICD-10-CM

## 2016-12-22 DIAGNOSIS — R001 Bradycardia, unspecified: Secondary | ICD-10-CM

## 2016-12-22 NOTE — Progress Notes (Signed)
CARDIOLOGY OFFICE NOTE  Date:  12/22/2016    [name] Date of Birth: 07/15/1928 Medical Record #[id number]  PCP:  Mathews Argyle, MD  Cardiologist:  Atilano Median    Chief Complaint  Patient presents with  . Atrial Fibrillation    Work in visit - seen for Dr. Lovena Le    History of Present Illness: [name] is a 83 y.o. female who presents today for a work in visit. Seen for Dr. Lovena Le. Former patient of Dr. Susa Simmonds. I previously took care of her husband [name] for many years.   She has a history of paroxysmal atrial fibrillation, prior stroke, dyslipidemia, hypertension, and diastolic heart failure. The patient has rheumatoid arthritis. Her atrial arrhythmias have beenwell-controlled. She remains on chronic anticoagulation.   Last seen by Dr. Lovena Le back in May of 2016 - felt to be doing ok from our standpoint.  I saw her back September of 2016 - lots more swelling in her legs - worse after being switched over to Verapamil. Good EF by echo.   She was hospitalized back in July of 2017 with acute on chronic diastolic HF. HR initially elevated but improved with metoprolol. She was in atrial fib - looks to me like this was probably the trigger for her demise. Looks like itwas assumed she hadchronic AF - however she was in NSR on EKG last year. Now on Xarelto for her anticoagulation. Metoprolol dose was increased along with her Lasix.   I saw her back for follow up - I felt like her AF was the probable trigger for recent exacerbation. Discussed with Dr. Lovena Le and we elected to place her on amiodarone therapy - low dose. She has done ok - we have been able to cut her beta blocker back due to bradycardia.   I saw her back in November after endorsing more chest pain. We opted to continue with medical management.   Phone call last week - complaining of hair loss - asking whether her medicines were the culprit - I stopped the metoprolol - did not want to stop both  metoprolol and amiodarone. Asked her to come back for follow up. She has transportation issues.   Comes in today. Here alone. She does feel better with stopping Metoprolol. Seems to have more energy. She is still concerned about her hair. No more chest pain. Not using NTG. Her rhythm has been ok as far as she can tell. She does get short of breath - typically with exertion - does ok at rest.   Past Medical History:  Diagnosis Date  . Arthritis    severe  . Atrial fibrillation ([place])   . Chronic diastolic CHF (congestive heart failure) ([place])   . Chronic insomnia   . Degenerative arthritis   . Essential hypertension   . Hypercholesteremia   . Memory deficit 05/24/2013  . Osteoporosis   . PAF (paroxysmal atrial fibrillation) ([place])    a. Intolerant to amiodarone, discontinued October 2012.  Marland Kitchen RLS (restless legs syndrome) 08/11/2016  . Sinus bradycardia   . Stroke [facility]) 2007   mini stroke or TIA with aphasia     Past Surgical History:  Procedure Laterality Date  . CATARACT EXTRACTION  2009   ou  . FEMUR FRACTURE SURGERY  8/12  . left knee  2001  . REPLACEMENT TOTAL KNEE BILATERAL Right 1999  . right intertrochanteric hip fracture status post ORIF  August 2012  . right shoulder replacement  2009  Medications: Current Outpatient Prescriptions  Medication Sig Dispense Refill  . alclomethasone (ACLOVATE) 0.05 % cream Apply 1 application topically as needed (rash/redness).     Marland Kitchen amiodarone (PACERONE) 100 MG tablet Take one tablet (100 mg) by mouth once daily 30 tablet 6  . Cholecalciferol (VITAMIN D-3 PO) Take 1 capsule by mouth daily.    . Cyanocobalamin (VITAMIN B-12 PO) Take 1 tablet by mouth daily.    . furosemide (LASIX) 40 MG tablet Take 1 tablet (40 mg total) by mouth daily. May take an extra 20 mg prn weight gain of 2 pounds overnight. 45 tablet 9  . gabapentin (NEURONTIN) 300 MG capsule Take 300 mg by mouth once as needed.    . Melatonin 3 MG TABS Take 6 mg by mouth at  bedtime as needed (for sleep).    . pramipexole (MIRAPEX) 0.125 MG tablet Take 1 tablet (0.125 mg total) by mouth at bedtime. (Patient taking differently: Take 0.125 mg by mouth as needed. ) 30 tablet 3  . RESTASIS 0.05 % ophthalmic emulsion Place 1 drop into both eyes 2 (two) times daily.    . Rivaroxaban (XARELTO) 15 MG TABS tablet Take 1 tablet (15 mg total) by mouth daily with supper. 30 tablet 6  . rivastigmine (EXELON) 3 MG capsule Take 1 capsule (3 mg total) by mouth 2 (two) times daily. 60 capsule 5  . traMADol (ULTRAM) 50 MG tablet Take 50 mg by mouth 2 (two) times daily as needed (for pain). Reported on 01/22/2016    . VOLTAREN 1 % GEL Apply 1 application topically daily as needed (for leg pain).   3  . nitroGLYCERIN (NITROSTAT) 0.4 MG SL tablet Place 1 tablet (0.4 mg total) under the tongue every 5 (five) minutes as needed for chest pain. 25 tablet 1   No current facility-administered medications for this visit.     Allergies: Allergies  Allergen Reactions  . Codeine Nausea Only    Social History: The patient  reports that she has never smoked. She has never used smokeless tobacco. She reports that she drinks about 4.2 oz of alcohol per week . She reports that she does not use drugs.   Family History: The patient's family history includes Cancer in her father, sister, sister, and son; Cancer - Colon in her father; Dementia in her mother; Stroke in her mother.   Review of Systems: Please see the history of present illness.   Otherwise, the review of systems is positive for none.   All other systems are reviewed and negative.   Physical Exam: VS:  BP 132/60   Pulse 80   Ht 5' 4.5" (1.638 m)   Wt 123 lb 1.9 oz (55.8 kg)   BMI 20.81 kg/m  .  BMI Body mass index is 20.81 kg/m.  Wt Readings from Last 3 Encounters:  12/22/16 123 lb 1.9 oz (55.8 kg)  09/02/16 123 lb (55.8 kg)  08/11/16 122 lb 8 oz (55.6 kg)    General: Pleasant. Elderly female who is alert and in no acute  distress.   HEENT: Normal.  Neck: Supple, no JVD, carotid bruits, or masses noted.  Cardiac: Regular rate and rhythm. No murmurs, rubs, or gallops. No edema.  Respiratory:  Lungs are clear to auscultation bilaterally with normal work of breathing.  GI: Soft and nontender.  MS: No deformity or atrophy. Gait and ROM intact. Using her cane.  Skin: Warm and dry. Color is normal.  Neuro:  Strength and sensation are intact and  no gross focal deficits noted.  Psych: Alert, appropriate and with normal affect.   LABORATORY DATA:  EKG:  EKG is ordered today. This demonstrates NSR.  Lab Results  Component Value Date   WBC 5.4 06/29/2016   HGB 13.3 06/29/2016   HCT 39.8 06/29/2016   PLT 140 06/29/2016   GLUCOSE 102 (H) 06/29/2016   ALT 27 06/29/2016   AST 27 06/29/2016   NA 139 06/29/2016   K 3.9 06/29/2016   CL 96 (L) 06/29/2016   CREATININE 1.06 (H) 06/29/2016   BUN 35 (H) 06/29/2016   CO2 34 (H) 06/29/2016   TSH 1.49 06/29/2016   INR 1.26 05/26/2016    BNP (last 3 results)  Recent Labs  05/26/16 1425 06/29/16 1606  BNP 702.6* 322.5*    ProBNP (last 3 results) No results for input(s): PROBNP in the last 8760 hours.   Other Studies Reviewed Today:  Echo Study Conclusions from 03/2015  - Left ventricle: The cavity size was normal. Wall thickness was increased in a pattern of mild LVH. Systolic function was normal. The estimated ejection fraction was in the range of 55% to 60%. Doppler parameters are consistent with abnormal left ventricular relaxation (grade 1 diastolic dysfunction). - Aortic valve: There was mild regurgitation. - Mitral valve: Calcified annulus. Mildly thickened leaflets . There was mild regurgitation. - Atrial septum: No defect or patent foramen ovale was identified. - Pulmonary arteries: PA peak pressure: 35 mm Hg (S).    Lower Extremity Duplex Summary: No evidence of deep vein thrombosis involving the right common femoral vein  and left lower extremity.  Other specific details can be found in the table(s) above. Prepared and Electronically Authenticated by  Gae Gallop MD 2016-09-02T14:05:48   Assessment/Plan:  1. Complains of hair loss - stopped metoprolol. I suspect this is more multifactorial but she does feel better off of it. Lab today.   2. Chest pain - will try to manage conservatively. Hard to say if this is cardiac, GI or due to arthritis. This seems better - not clear why.  3. Chronic diastolic HF - with past exacerbation that I suspected was due to recurrent AF with RVR.  EKG today shows she remains in NSR. Only on amiodarone back to just 100 mg a day and no longer on her Metoprolol.   4. PAF - back in NSR by exam and by EKG today. HR has improved.   5. Chronic anticoagulation - no problems noted. I checked her creatinine clearance at prior visit it was 33 - confirmed with the pharmacist - needs to be on 15 mg of Xarelto - her dose was changed.   6. CKD   7. Advanced age.    Current medicines are reviewed with the patient today.  The patient does not have concerns regarding medicines other than what has been noted above.  The following changes have been made:  See above.  Labs/ tests ordered today include:    Orders Placed This Encounter  Procedures  . Basic metabolic panel  . CBC  . Hepatic function panel  . TSH  . EKG 12-Lead     Disposition:   FU with me in 4 months.    Patient is agreeable to this plan and will call if any problems develop in the interim.   SignedTruitt Merle, NP  12/22/2016 2:50 PM  [facility] [address] [place], [place]  [address] Phone: [phone] Fax: [phone]

## 2016-12-22 NOTE — Patient Instructions (Addendum)
We will be checking the following labs today - BMET, CBC, HPF and TSH   Medication Instructions:    Continue with your current medicines.     Testing/Procedures To Be Arranged:  N/A  Follow-Up:   See me in 4 months    Other Special Instructions:   N/A    If you need a refill on your cardiac medications before your next appointment, please call your pharmacy.   Call the [place] office at [phone] if you have any questions, problems or concerns.

## 2016-12-23 LAB — BASIC METABOLIC PANEL
BUN/Creatinine Ratio: 26 (ref 12–28)
BUN: 27 mg/dL (ref 8–27)
CO2: 30 mmol/L — ABNORMAL HIGH (ref 18–29)
Calcium: 9.6 mg/dL (ref 8.7–10.3)
Chloride: 98 mmol/L (ref 96–106)
Creatinine, Ser: 1.03 mg/dL — ABNORMAL HIGH (ref 0.57–1.00)
GFR calc Af Amer: 57 — ABNORMAL LOW (ref >59)
GFR calc non Af Amer: 49 — ABNORMAL LOW (ref >59)
Glucose: 123 mg/dL — ABNORMAL HIGH (ref 65–99)
Potassium: 4.6 mmol/L (ref 3.5–5.2)
Sodium: 141 mmol/L (ref 134–144)

## 2016-12-23 LAB — CBC
Hematocrit: 34.5 % (ref 34.0–46.6)
Hemoglobin: 11.2 g/dL (ref 11.1–15.9)
MCH: 30.4 pg (ref 26.6–33.0)
MCHC: 32.5 g/dL (ref 31.5–35.7)
MCV: 94 fL (ref 79–97)
Platelets: 166 10*3/uL (ref 150–379)
RBC: 3.68 x10E6/uL — ABNORMAL LOW (ref 3.77–5.28)
RDW: 14.1 % (ref 12.3–15.4)
WBC: 5.5 10*3/uL (ref 3.4–10.8)

## 2016-12-23 LAB — HEPATIC FUNCTION PANEL
ALT: 16 IU/L (ref 0–32)
AST: 21 IU/L (ref 0–40)
Albumin: 4.2 g/dL (ref 3.5–4.7)
Alkaline Phosphatase: 56 IU/L (ref 39–117)
Bilirubin Total: 0.2 mg/dL (ref 0.0–1.2)
Bilirubin, Direct: 0.09 mg/dL (ref 0.00–0.40)
Total Protein: 6.4 g/dL (ref 6.0–8.5)

## 2016-12-23 LAB — TSH: TSH: 0.838 u[IU]/mL (ref 0.450–4.500)

## 2016-12-31 DIAGNOSIS — I4891 Unspecified atrial fibrillation: Secondary | ICD-10-CM | POA: Diagnosis not present

## 2016-12-31 DIAGNOSIS — S8011XA Contusion of right lower leg, initial encounter: Secondary | ICD-10-CM | POA: Diagnosis not present

## 2016-12-31 DIAGNOSIS — S81801A Unspecified open wound, right lower leg, initial encounter: Secondary | ICD-10-CM | POA: Diagnosis not present

## 2016-12-31 DIAGNOSIS — I10 Essential (primary) hypertension: Secondary | ICD-10-CM | POA: Diagnosis not present

## 2017-01-01 DIAGNOSIS — S81802D Unspecified open wound, left lower leg, subsequent encounter: Secondary | ICD-10-CM | POA: Diagnosis not present

## 2017-01-03 DIAGNOSIS — S81802D Unspecified open wound, left lower leg, subsequent encounter: Secondary | ICD-10-CM | POA: Diagnosis not present

## 2017-01-05 DIAGNOSIS — S81802S Unspecified open wound, left lower leg, sequela: Secondary | ICD-10-CM | POA: Diagnosis not present

## 2017-01-06 ENCOUNTER — Other Ambulatory Visit: Payer: Self-pay | Admitting: Neurology

## 2017-01-06 NOTE — Telephone Encounter (Signed)
Faxed printed/signed rx belsomra to pt pharmacy. Fax: [phone]. Received confirmation.

## 2017-01-09 DIAGNOSIS — S81802S Unspecified open wound, left lower leg, sequela: Secondary | ICD-10-CM | POA: Diagnosis not present

## 2017-01-12 DIAGNOSIS — S81802S Unspecified open wound, left lower leg, sequela: Secondary | ICD-10-CM | POA: Diagnosis not present

## 2017-01-13 DIAGNOSIS — S81812A Laceration without foreign body, left lower leg, initial encounter: Secondary | ICD-10-CM | POA: Diagnosis not present

## 2017-01-13 DIAGNOSIS — Z4802 Encounter for removal of sutures: Secondary | ICD-10-CM | POA: Diagnosis not present

## 2017-01-15 DIAGNOSIS — M47819 Spondylosis without myelopathy or radiculopathy, site unspecified: Secondary | ICD-10-CM | POA: Diagnosis not present

## 2017-01-15 DIAGNOSIS — M48061 Spinal stenosis, lumbar region without neurogenic claudication: Secondary | ICD-10-CM | POA: Diagnosis not present

## 2017-01-15 DIAGNOSIS — I110 Hypertensive heart disease with heart failure: Secondary | ICD-10-CM | POA: Diagnosis not present

## 2017-01-15 DIAGNOSIS — S81812D Laceration without foreign body, left lower leg, subsequent encounter: Secondary | ICD-10-CM | POA: Diagnosis not present

## 2017-01-15 DIAGNOSIS — I5030 Unspecified diastolic (congestive) heart failure: Secondary | ICD-10-CM | POA: Diagnosis not present

## 2017-01-15 DIAGNOSIS — I4891 Unspecified atrial fibrillation: Secondary | ICD-10-CM | POA: Diagnosis not present

## 2017-01-16 DIAGNOSIS — I5030 Unspecified diastolic (congestive) heart failure: Secondary | ICD-10-CM | POA: Diagnosis not present

## 2017-01-16 DIAGNOSIS — S81812D Laceration without foreign body, left lower leg, subsequent encounter: Secondary | ICD-10-CM | POA: Diagnosis not present

## 2017-01-16 DIAGNOSIS — M48061 Spinal stenosis, lumbar region without neurogenic claudication: Secondary | ICD-10-CM | POA: Diagnosis not present

## 2017-01-16 DIAGNOSIS — I4891 Unspecified atrial fibrillation: Secondary | ICD-10-CM | POA: Diagnosis not present

## 2017-01-16 DIAGNOSIS — I110 Hypertensive heart disease with heart failure: Secondary | ICD-10-CM | POA: Diagnosis not present

## 2017-01-16 DIAGNOSIS — M47819 Spondylosis without myelopathy or radiculopathy, site unspecified: Secondary | ICD-10-CM | POA: Diagnosis not present

## 2017-01-18 DIAGNOSIS — I10 Essential (primary) hypertension: Secondary | ICD-10-CM | POA: Diagnosis not present

## 2017-01-18 DIAGNOSIS — M810 Age-related osteoporosis without current pathological fracture: Secondary | ICD-10-CM | POA: Diagnosis not present

## 2017-01-18 DIAGNOSIS — M2669 Other specified disorders of temporomandibular joint: Secondary | ICD-10-CM | POA: Diagnosis not present

## 2017-01-18 DIAGNOSIS — S81812D Laceration without foreign body, left lower leg, subsequent encounter: Secondary | ICD-10-CM | POA: Diagnosis not present

## 2017-01-19 DIAGNOSIS — M48061 Spinal stenosis, lumbar region without neurogenic claudication: Secondary | ICD-10-CM | POA: Diagnosis not present

## 2017-01-19 DIAGNOSIS — M47819 Spondylosis without myelopathy or radiculopathy, site unspecified: Secondary | ICD-10-CM | POA: Diagnosis not present

## 2017-01-19 DIAGNOSIS — S81812D Laceration without foreign body, left lower leg, subsequent encounter: Secondary | ICD-10-CM | POA: Diagnosis not present

## 2017-01-19 DIAGNOSIS — I110 Hypertensive heart disease with heart failure: Secondary | ICD-10-CM | POA: Diagnosis not present

## 2017-01-19 DIAGNOSIS — I5030 Unspecified diastolic (congestive) heart failure: Secondary | ICD-10-CM | POA: Diagnosis not present

## 2017-01-19 DIAGNOSIS — I4891 Unspecified atrial fibrillation: Secondary | ICD-10-CM | POA: Diagnosis not present

## 2017-01-21 DIAGNOSIS — M48061 Spinal stenosis, lumbar region without neurogenic claudication: Secondary | ICD-10-CM | POA: Diagnosis not present

## 2017-01-21 DIAGNOSIS — M47819 Spondylosis without myelopathy or radiculopathy, site unspecified: Secondary | ICD-10-CM | POA: Diagnosis not present

## 2017-01-21 DIAGNOSIS — I5030 Unspecified diastolic (congestive) heart failure: Secondary | ICD-10-CM | POA: Diagnosis not present

## 2017-01-21 DIAGNOSIS — I4891 Unspecified atrial fibrillation: Secondary | ICD-10-CM | POA: Diagnosis not present

## 2017-01-21 DIAGNOSIS — S81812D Laceration without foreign body, left lower leg, subsequent encounter: Secondary | ICD-10-CM | POA: Diagnosis not present

## 2017-01-21 DIAGNOSIS — I110 Hypertensive heart disease with heart failure: Secondary | ICD-10-CM | POA: Diagnosis not present

## 2017-01-22 ENCOUNTER — Ambulatory Visit (HOSPITAL_BASED_OUTPATIENT_CLINIC_OR_DEPARTMENT_OTHER): Payer: Medicare Other

## 2017-01-23 DIAGNOSIS — S81812D Laceration without foreign body, left lower leg, subsequent encounter: Secondary | ICD-10-CM | POA: Diagnosis not present

## 2017-01-23 DIAGNOSIS — I110 Hypertensive heart disease with heart failure: Secondary | ICD-10-CM | POA: Diagnosis not present

## 2017-01-23 DIAGNOSIS — M48061 Spinal stenosis, lumbar region without neurogenic claudication: Secondary | ICD-10-CM | POA: Diagnosis not present

## 2017-01-23 DIAGNOSIS — I4891 Unspecified atrial fibrillation: Secondary | ICD-10-CM | POA: Diagnosis not present

## 2017-01-23 DIAGNOSIS — I5030 Unspecified diastolic (congestive) heart failure: Secondary | ICD-10-CM | POA: Diagnosis not present

## 2017-01-23 DIAGNOSIS — M47819 Spondylosis without myelopathy or radiculopathy, site unspecified: Secondary | ICD-10-CM | POA: Diagnosis not present

## 2017-01-26 DIAGNOSIS — I10 Essential (primary) hypertension: Secondary | ICD-10-CM | POA: Diagnosis not present

## 2017-01-26 DIAGNOSIS — R600 Localized edema: Secondary | ICD-10-CM | POA: Diagnosis not present

## 2017-01-26 DIAGNOSIS — N3281 Overactive bladder: Secondary | ICD-10-CM | POA: Diagnosis not present

## 2017-01-26 DIAGNOSIS — S81802D Unspecified open wound, left lower leg, subsequent encounter: Secondary | ICD-10-CM | POA: Diagnosis not present

## 2017-01-27 DIAGNOSIS — I4891 Unspecified atrial fibrillation: Secondary | ICD-10-CM | POA: Diagnosis not present

## 2017-01-27 DIAGNOSIS — M47819 Spondylosis without myelopathy or radiculopathy, site unspecified: Secondary | ICD-10-CM | POA: Diagnosis not present

## 2017-01-27 DIAGNOSIS — S81812D Laceration without foreign body, left lower leg, subsequent encounter: Secondary | ICD-10-CM | POA: Diagnosis not present

## 2017-01-27 DIAGNOSIS — I5030 Unspecified diastolic (congestive) heart failure: Secondary | ICD-10-CM | POA: Diagnosis not present

## 2017-01-27 DIAGNOSIS — I110 Hypertensive heart disease with heart failure: Secondary | ICD-10-CM | POA: Diagnosis not present

## 2017-01-27 DIAGNOSIS — M48061 Spinal stenosis, lumbar region without neurogenic claudication: Secondary | ICD-10-CM | POA: Diagnosis not present

## 2017-01-29 DIAGNOSIS — I5030 Unspecified diastolic (congestive) heart failure: Secondary | ICD-10-CM | POA: Diagnosis not present

## 2017-01-29 DIAGNOSIS — I4891 Unspecified atrial fibrillation: Secondary | ICD-10-CM | POA: Diagnosis not present

## 2017-01-29 DIAGNOSIS — S81812D Laceration without foreign body, left lower leg, subsequent encounter: Secondary | ICD-10-CM | POA: Diagnosis not present

## 2017-01-29 DIAGNOSIS — M47819 Spondylosis without myelopathy or radiculopathy, site unspecified: Secondary | ICD-10-CM | POA: Diagnosis not present

## 2017-01-29 DIAGNOSIS — I110 Hypertensive heart disease with heart failure: Secondary | ICD-10-CM | POA: Diagnosis not present

## 2017-01-29 DIAGNOSIS — M48061 Spinal stenosis, lumbar region without neurogenic claudication: Secondary | ICD-10-CM | POA: Diagnosis not present

## 2017-02-02 DIAGNOSIS — I4891 Unspecified atrial fibrillation: Secondary | ICD-10-CM | POA: Diagnosis not present

## 2017-02-02 DIAGNOSIS — M47819 Spondylosis without myelopathy or radiculopathy, site unspecified: Secondary | ICD-10-CM | POA: Diagnosis not present

## 2017-02-02 DIAGNOSIS — K219 Gastro-esophageal reflux disease without esophagitis: Secondary | ICD-10-CM | POA: Diagnosis not present

## 2017-02-02 DIAGNOSIS — I110 Hypertensive heart disease with heart failure: Secondary | ICD-10-CM | POA: Diagnosis not present

## 2017-02-02 DIAGNOSIS — I5030 Unspecified diastolic (congestive) heart failure: Secondary | ICD-10-CM | POA: Diagnosis not present

## 2017-02-02 DIAGNOSIS — S81812D Laceration without foreign body, left lower leg, subsequent encounter: Secondary | ICD-10-CM | POA: Diagnosis not present

## 2017-02-02 DIAGNOSIS — H6123 Impacted cerumen, bilateral: Secondary | ICD-10-CM | POA: Diagnosis not present

## 2017-02-02 DIAGNOSIS — M48061 Spinal stenosis, lumbar region without neurogenic claudication: Secondary | ICD-10-CM | POA: Diagnosis not present

## 2017-02-02 DIAGNOSIS — Z7289 Other problems related to lifestyle: Secondary | ICD-10-CM | POA: Diagnosis not present

## 2017-02-05 DIAGNOSIS — I4891 Unspecified atrial fibrillation: Secondary | ICD-10-CM | POA: Diagnosis not present

## 2017-02-05 DIAGNOSIS — M47819 Spondylosis without myelopathy or radiculopathy, site unspecified: Secondary | ICD-10-CM | POA: Diagnosis not present

## 2017-02-05 DIAGNOSIS — S81812D Laceration without foreign body, left lower leg, subsequent encounter: Secondary | ICD-10-CM | POA: Diagnosis not present

## 2017-02-05 DIAGNOSIS — M48061 Spinal stenosis, lumbar region without neurogenic claudication: Secondary | ICD-10-CM | POA: Diagnosis not present

## 2017-02-05 DIAGNOSIS — I110 Hypertensive heart disease with heart failure: Secondary | ICD-10-CM | POA: Diagnosis not present

## 2017-02-05 DIAGNOSIS — I5030 Unspecified diastolic (congestive) heart failure: Secondary | ICD-10-CM | POA: Diagnosis not present

## 2017-02-09 ENCOUNTER — Ambulatory Visit (INDEPENDENT_AMBULATORY_CARE_PROVIDER_SITE_OTHER): Payer: Medicare Other | Admitting: Nurse Practitioner

## 2017-02-09 ENCOUNTER — Encounter: Payer: Self-pay | Admitting: Nurse Practitioner

## 2017-02-09 VITALS — BP 151/77 | HR 84 | Wt 122.8 lb

## 2017-02-09 DIAGNOSIS — I5030 Unspecified diastolic (congestive) heart failure: Secondary | ICD-10-CM | POA: Diagnosis not present

## 2017-02-09 DIAGNOSIS — S81812D Laceration without foreign body, left lower leg, subsequent encounter: Secondary | ICD-10-CM | POA: Diagnosis not present

## 2017-02-09 DIAGNOSIS — G2581 Restless legs syndrome: Secondary | ICD-10-CM

## 2017-02-09 DIAGNOSIS — I4891 Unspecified atrial fibrillation: Secondary | ICD-10-CM | POA: Diagnosis not present

## 2017-02-09 DIAGNOSIS — G4700 Insomnia, unspecified: Secondary | ICD-10-CM

## 2017-02-09 DIAGNOSIS — R413 Other amnesia: Secondary | ICD-10-CM | POA: Diagnosis not present

## 2017-02-09 DIAGNOSIS — M47819 Spondylosis without myelopathy or radiculopathy, site unspecified: Secondary | ICD-10-CM | POA: Diagnosis not present

## 2017-02-09 DIAGNOSIS — I110 Hypertensive heart disease with heart failure: Secondary | ICD-10-CM | POA: Diagnosis not present

## 2017-02-09 DIAGNOSIS — M48061 Spinal stenosis, lumbar region without neurogenic claudication: Secondary | ICD-10-CM | POA: Diagnosis not present

## 2017-02-09 MED ORDER — PRAMIPEXOLE DIHYDROCHLORIDE 0.125 MG PO TABS: 0.1250 mg | ORAL_TABLET | Freq: Every day | ORAL | 11 refills | Status: DC

## 2017-02-09 MED ORDER — RIVASTIGMINE TARTRATE 3 MG PO CAPS: 3.0000 mg | ORAL_CAPSULE | Freq: Two times a day (BID) | ORAL | 11 refills | Status: DC

## 2017-02-09 NOTE — Progress Notes (Signed)
I have read the note, and I agree with the clinical assessment and plan.  WILLIS,CHARLES KEITH   

## 2017-02-09 NOTE — Progress Notes (Signed)
[facility]  PATIENT: [name] DOB: [birth year]   REASON FOR VISIT: Follow-up for memory loss HISTORY FROM: Patient alone at visit    HISTORY OF PRESENT ILLNESS:[name] is an 83 year old right-handed white female with a history of a mild memory problem, restless legs, and insomnia . She had a hospitalization in July of last year for  congestive heart failure and atrial fibrillation. The patient reports that she is doing well with her memory, she is tolerating the Exelon capsules well. The patient has had problems with restless leg syndrome 3 or 4 nights a week, she oftentimes has to get up and walk to get rid of the symptoms. She is on Mirapex however she does not take medication every night. Sometimes she takes gabapentin . Belsomra helps her  insomnia . She has a wound  left lower leg that occurred back in February when she hit her metal bed frame. Home health nurse comes out every day to assess and dress the wound.  She remains independent in all activities of daily living, continues to drive without getting lost. She comes to this office for an evaluation.   REVIEW OF SYSTEMS: Full 14 system review of systems performed and notable only for those listed, all others are neg:  Constitutional: neg  Cardiovascular: neg Ear/Nose/Throat: neg  Skin: neg Eyes: neg Respiratory: Shortness of breath Gastroitestinal: neg  Hematology/Lymphatic: neg  Endocrine: neg Musculoskeletal: Walking difficulty Allergy/Immunology: neg Neurological: neg Psychiatric: neg Sleep : Restless leg, insomnia   ALLERGIES: Allergies  Allergen Reactions  . Codeine Nausea Only    HOME MEDICATIONS: Outpatient Medications Prior to Visit  Medication Sig Dispense Refill  . alclomethasone (ACLOVATE) 0.05 % cream Apply 1 application topically as needed (rash/redness).     Marland Kitchen amiodarone (PACERONE) 100 MG tablet Take one tablet (100 mg) by mouth once daily 30 tablet 6  . BELSOMRA 20 MG TABS TAKE  1 TABLET BY MOUTH AT BEDTIME AS NEEDED 30 tablet 3  . Cholecalciferol (VITAMIN D-3 PO) Take 1 capsule by mouth daily.    . Cyanocobalamin (VITAMIN B-12 PO) Take 1 tablet by mouth daily.    . furosemide (LASIX) 40 MG tablet Take 1 tablet (40 mg total) by mouth daily. May take an extra 20 mg prn weight gain of 2 pounds overnight. 45 tablet 9  . gabapentin (NEURONTIN) 300 MG capsule Take 300 mg by mouth once as needed.    . Melatonin 3 MG TABS Take 6 mg by mouth at bedtime as needed (for sleep).    . pramipexole (MIRAPEX) 0.125 MG tablet Take 1 tablet (0.125 mg total) by mouth at bedtime. (Patient taking differently: Take 0.125 mg by mouth as needed. ) 30 tablet 3  . RESTASIS 0.05 % ophthalmic emulsion Place 1 drop into both eyes 2 (two) times daily.    . Rivaroxaban (XARELTO) 15 MG TABS tablet Take 1 tablet (15 mg total) by mouth daily with supper. 30 tablet 6  . rivastigmine (EXELON) 3 MG capsule Take 1 capsule (3 mg total) by mouth 2 (two) times daily. 60 capsule 5  . traMADol (ULTRAM) 50 MG tablet Take 50 mg by mouth 2 (two) times daily as needed (for pain). Reported on 01/22/2016    . VOLTAREN 1 % GEL Apply 1 application topically daily as needed (for leg pain).   3  . nitroGLYCERIN (NITROSTAT) 0.4 MG SL tablet Place 1 tablet (0.4 mg total) under the tongue every 5 (five) minutes as needed for chest pain.  25 tablet 1   No facility-administered medications prior to visit.     PAST MEDICAL HISTORY: Past Medical History:  Diagnosis Date  . Arthritis    severe  . Atrial fibrillation ([place])   . Chronic diastolic CHF (congestive heart failure) ([place])   . Chronic insomnia   . Degenerative arthritis   . Essential hypertension   . Hypercholesteremia   . Memory deficit 05/24/2013  . Osteoporosis   . PAF (paroxysmal atrial fibrillation) ([place])    a. Intolerant to amiodarone, discontinued October 2012.  Marland Kitchen RLS (restless legs syndrome) 08/11/2016  . Sinus bradycardia   . Stroke [facility]) 2007   mini  stroke or TIA with aphasia     PAST SURGICAL HISTORY: Past Surgical History:  Procedure Laterality Date  . CATARACT EXTRACTION  2009   ou  . FEMUR FRACTURE SURGERY  8/12  . left knee  2001  . REPLACEMENT TOTAL KNEE BILATERAL Right 1999  . right intertrochanteric hip fracture status post ORIF  August 2012  . right shoulder replacement  2009    FAMILY HISTORY: Family History  Problem Relation Age of Onset  . Dementia Mother   . Stroke Mother   . Cancer - Colon Father   . Cancer Father   . Cancer Sister   . Cancer Son   . Cancer Sister   . Coronary artery disease      SOCIAL HISTORY: Social History   Social History  . Marital status: Widowed    Spouse name: N/A  . Number of children: 2  . Years of education: MA   Occupational History  . RETIRED    Social History Main Topics  . Smoking status: Never Smoker  . Smokeless tobacco: Never Used  . Alcohol use 4.2 oz/week    7 Glasses of wine per week     Comment: 1 glass wine with dinner some days  . Drug use: No  . Sexual activity: Not on file   Other Topics Concern  . Not on file   Social History Narrative   Patient drinks 1 cup of coffee daily.   Patient is right handed.        PHYSICAL EXAM  Vitals:   02/09/17 1516  BP: (!) 151/77  Pulse: 84  Weight: 122 lb 12.8 oz (55.7 kg)   Body mass index is 20.75 kg/m.  Generalized: Well developed, in no acute distress  Head: normocephalic and atraumatic,. Oropharynx benign  Musculoskeletal: No deformity   Neurological examination   Mentation: Alert oriented to time, place, history taking. Attention span and concentration appropriate. Recent and remote memory intact.  Follows all commands speech and language fluent. AFT 10 Clock drawing 4/4 MMSE - Mini Mental State Exam 02/09/2017 08/11/2016 01/22/2016  Orientation to time 4 4 4   Orientation to Place 5 5 5   Registration 3 3 3   Attention/ Calculation 4 5 5   Recall 3 3 2   Language- name 2 objects 2 2 2     Language- repeat 1 1 1   Language- follow 3 step command 3 3 3   Language- read & follow direction 1 1 1   Write a sentence 1 1 1   Copy design 1 1 1   Total score 28 29 28     Cranial nerve II-XII: Pupils were equal round reactive to light extraocular movements were full, visual field were full on confrontational test. Facial sensation and strength were normal. hearing was intact to finger rubbing bilaterally. Uvula tongue midline. head turning and shoulder shrug were normal  and symmetric.Tongue protrusion into cheek strength was normal. Motor: normal bulk and tone, full strength in the BUE, BLE, fine finger movements normal, no pronator drift. No focal weakness Sensory: normal and symmetric to light touch, on the face arms and legs  Coordination: finger-nose-finger, heel-to-shin bilaterally, no dysmetria Reflexes: Depressed and symmetric upper and lower, plantar responses were flexor bilaterally. Gait and Station: Rising up from seated position without assistance, mildly stooped posture slightly wide based gait, ambulates with single-point cane   DIAGNOSTIC DATA (LABS, IMAGING, TESTING) - I reviewed patient records, labs, notes, testing and imaging myself where available.  Lab Results  Component Value Date   WBC 5.5 12/22/2016   HGB 13.3 06/29/2016   HCT 34.5 12/22/2016   MCV 94 12/22/2016   PLT 166 12/22/2016      Component Value Date/Time   NA 141 12/22/2016 1447   K 4.6 12/22/2016 1447   CL 98 12/22/2016 1447   CO2 30 (H) 12/22/2016 1447   GLUCOSE 123 (H) 12/22/2016 1447   GLUCOSE 102 (H) 06/29/2016 1606   BUN 27 12/22/2016 1447   CREATININE 1.03 (H) 12/22/2016 1447   CREATININE 1.06 (H) 06/29/2016 1606   CALCIUM 9.6 12/22/2016 1447   PROT 6.4 12/22/2016 1447   ALBUMIN 4.2 12/22/2016 1447   AST 21 12/22/2016 1447   ALT 16 12/22/2016 1447   ALKPHOS 56 12/22/2016 1447   BILITOT 0.2 12/22/2016 1447   GFRNONAA 49 (L) 12/22/2016 1447   GFRAA 57 (L) 12/22/2016 1447    Lab  Results  Component Value Date   TSH 0.838 12/22/2016     ASSESSMENT AND PLAN  83 y.o. year old female  has a past medical history of Arthritis; Atrial fibrillation ([place]); Chronic diastolic CHF (congestive heart failure) ([place]); Chronic insomnia;  Memory deficit (05/24/2013); (paroxysmal atrial fibrillation) ([place]); RLS (restless legs syndrome) (08/11/2016); here to follow-up   PLAN: Continue Exelon for memory will refill Continue Belsomra for insomnia  Continue Mirapex for restless legs will refill Memory score is stable  Follow up in 6 months I spent 25 minutes in total face to face time with the patient more than 50% of which was spent counseling and coordination of care, reviewing test results reviewing medications and discussing and reviewing the diagnosis of restless leg syndrome as it relates to insomnia. Her insomnia may improve if she would take her Mirapex every night instead of several times a week. She was given some patient education material and insomnia.she was reassured that her memory is stable  , Dennie Bible, [facility], [facility], APRN  [facility] [address], [place], [place] [address] [phone]

## 2017-02-09 NOTE — Patient Instructions (Addendum)
Continue Exelon for memory will refill Continue Belsomra for insomnia  Continue Mirapex for restless legs take every night not PRN  Memory score is stable  Follow up in 6 months  Insomnia Insomnia is a sleep disorder that makes it difficult to fall asleep or to stay asleep. Insomnia can cause tiredness (fatigue), low energy, difficulty concentrating, mood swings, and poor performance at work or school. There are three different ways to classify insomnia:  Difficulty falling asleep.  Difficulty staying asleep.  Waking up too early in the morning. Any type of insomnia can be long-term (chronic) or short-term (acute). Both are common. Short-term insomnia usually lasts for three months or less. Chronic insomnia occurs at least three times a week for longer than three months. What are the causes? Insomnia may be caused by another condition, situation, or substance, such as:  Anxiety.  Certain medicines.  Gastroesophageal reflux disease (GERD) or other gastrointestinal conditions.  Asthma or other breathing conditions.  Restless legs syndrome, sleep apnea, or other sleep disorders.  Chronic pain.  Menopause. This may include hot flashes.  Stroke.  Abuse of alcohol, tobacco, or illegal drugs.  Depression.  Caffeine.  Neurological disorders, such as Alzheimer disease.  An overactive thyroid (hyperthyroidism). The cause of insomnia may not be known. What increases the risk? Risk factors for insomnia include:  Gender. Women are more commonly affected than men.  Age. Insomnia is more common as you get older.  Stress. This may involve your professional or personal life.  Income. Insomnia is more common in people with lower income.  Lack of exercise.  Irregular work schedule or night shifts.  Traveling between different time zones. What are the signs or symptoms? If you have insomnia, trouble falling asleep or trouble staying asleep is the main symptom. This may lead  to other symptoms, such as:  Feeling fatigued.  Feeling nervous about going to sleep.  Not feeling rested in the morning.  Having trouble concentrating.  Feeling irritable, anxious, or depressed. How is this treated? Treatment for insomnia depends on the cause. If your insomnia is caused by an underlying condition, treatment will focus on addressing the condition. Treatment may also include:  Medicines to help you sleep.  Counseling or therapy.  Lifestyle adjustments. Follow these instructions at home:  Take medicines only as directed by your health care provider.  Keep regular sleeping and waking hours. Avoid naps.  Keep a sleep diary to help you and your health care provider figure out what could be causing your insomnia. Include:  When you sleep.  When you wake up during the night.  How well you sleep.  How rested you feel the next day.  Any side effects of medicines you are taking.  What you eat and drink.  Make your bedroom a comfortable place where it is easy to fall asleep:  Put up shades or special blackout curtains to block light from outside.  Use a white noise machine to block noise.  Keep the temperature cool.  Exercise regularly as directed by your health care provider. Avoid exercising right before bedtime.  Use relaxation techniques to manage stress. Ask your health care provider to suggest some techniques that may work well for you. These may include:  Breathing exercises.  Routines to release muscle tension.  Visualizing peaceful scenes.  Cut back on alcohol, caffeinated beverages, and cigarettes, especially close to bedtime. These can disrupt your sleep.  Do not overeat or eat spicy foods right before bedtime. This can lead to  digestive discomfort that can make it hard for you to sleep.  Limit screen use before bedtime. This includes:  Watching TV.  Using your smartphone, tablet, and computer.  Stick to a routine. This can help you  fall asleep faster. Try to do a quiet activity, brush your teeth, and go to bed at the same time each night.  Get out of bed if you are still awake after 15 minutes of trying to sleep. Keep the lights down, but try reading or doing a quiet activity. When you feel sleepy, go back to bed.  Make sure that you drive carefully. Avoid driving if you feel very sleepy.  Keep all follow-up appointments as directed by your health care provider. This is important. Contact a health care provider if:  You are tired throughout the day or have trouble in your daily routine due to sleepiness.  You continue to have sleep problems or your sleep problems get worse. Get help right away if:  You have serious thoughts about hurting yourself or someone else. This information is not intended to replace advice given to you by your health care provider. Make sure you discuss any questions you have with your health care provider. Document Released: 10/16/2000 Document Revised: 03/20/2016 Document Reviewed: 07/20/2014 Elsevier Interactive Patient Education  2017 [employer].

## 2017-02-12 ENCOUNTER — Encounter (HOSPITAL_BASED_OUTPATIENT_CLINIC_OR_DEPARTMENT_OTHER): Payer: Medicare Other | Attending: Internal Medicine

## 2017-02-12 DIAGNOSIS — I480 Paroxysmal atrial fibrillation: Secondary | ICD-10-CM | POA: Diagnosis not present

## 2017-02-12 DIAGNOSIS — X58XXXD Exposure to other specified factors, subsequent encounter: Secondary | ICD-10-CM | POA: Diagnosis not present

## 2017-02-12 DIAGNOSIS — S81812D Laceration without foreign body, left lower leg, subsequent encounter: Secondary | ICD-10-CM | POA: Diagnosis not present

## 2017-02-12 DIAGNOSIS — I87312 Chronic venous hypertension (idiopathic) with ulcer of left lower extremity: Secondary | ICD-10-CM | POA: Diagnosis not present

## 2017-02-12 DIAGNOSIS — I110 Hypertensive heart disease with heart failure: Secondary | ICD-10-CM | POA: Diagnosis not present

## 2017-02-12 DIAGNOSIS — I5032 Chronic diastolic (congestive) heart failure: Secondary | ICD-10-CM | POA: Insufficient documentation

## 2017-02-12 DIAGNOSIS — L97822 Non-pressure chronic ulcer of other part of left lower leg with fat layer exposed: Secondary | ICD-10-CM | POA: Diagnosis not present

## 2017-02-12 DIAGNOSIS — M1990 Unspecified osteoarthritis, unspecified site: Secondary | ICD-10-CM | POA: Insufficient documentation

## 2017-02-12 DIAGNOSIS — I87332 Chronic venous hypertension (idiopathic) with ulcer and inflammation of left lower extremity: Secondary | ICD-10-CM | POA: Diagnosis not present

## 2017-02-12 DIAGNOSIS — M810 Age-related osteoporosis without current pathological fracture: Secondary | ICD-10-CM | POA: Diagnosis not present

## 2017-02-19 DIAGNOSIS — S81812D Laceration without foreign body, left lower leg, subsequent encounter: Secondary | ICD-10-CM | POA: Diagnosis not present

## 2017-02-19 DIAGNOSIS — M1990 Unspecified osteoarthritis, unspecified site: Secondary | ICD-10-CM | POA: Diagnosis not present

## 2017-02-19 DIAGNOSIS — I5032 Chronic diastolic (congestive) heart failure: Secondary | ICD-10-CM | POA: Diagnosis not present

## 2017-02-19 DIAGNOSIS — I110 Hypertensive heart disease with heart failure: Secondary | ICD-10-CM | POA: Diagnosis not present

## 2017-02-19 DIAGNOSIS — I87312 Chronic venous hypertension (idiopathic) with ulcer of left lower extremity: Secondary | ICD-10-CM | POA: Diagnosis not present

## 2017-02-19 DIAGNOSIS — I480 Paroxysmal atrial fibrillation: Secondary | ICD-10-CM | POA: Diagnosis not present

## 2017-02-19 DIAGNOSIS — I87332 Chronic venous hypertension (idiopathic) with ulcer and inflammation of left lower extremity: Secondary | ICD-10-CM | POA: Diagnosis not present

## 2017-02-19 DIAGNOSIS — L97822 Non-pressure chronic ulcer of other part of left lower leg with fat layer exposed: Secondary | ICD-10-CM | POA: Diagnosis not present

## 2017-02-22 ENCOUNTER — Telehealth: Payer: Self-pay | Admitting: Nurse Practitioner

## 2017-02-22 NOTE — Telephone Encounter (Signed)
Pt called in and said that she needed a call back as soon as [name] is free. She states that she was asked to call Pod M directly re: her cardiac problem and medication that she is on. She wouldn't get more specific, just asked that you call.

## 2017-02-22 NOTE — Telephone Encounter (Signed)
I have called Ms. Zuckerman - she has a wound on her lower leg - requiring debridement. She is asking about using Tylenol. This should be ok.  Also asking about what to do in the event that she feels like she has missed a dose of Xarelto - the pills are very small and with her arthritis - very hard for her to handle. I would favor just waiting to take the next dose instead of the possibility of doubling up.   Will see back as planned.   Burtis Junes, RN, [place] [address] [place], [place]  [phone]

## 2017-02-24 DIAGNOSIS — S81802D Unspecified open wound, left lower leg, subsequent encounter: Secondary | ICD-10-CM | POA: Diagnosis not present

## 2017-02-26 DIAGNOSIS — I87332 Chronic venous hypertension (idiopathic) with ulcer and inflammation of left lower extremity: Secondary | ICD-10-CM | POA: Diagnosis not present

## 2017-02-26 DIAGNOSIS — I5032 Chronic diastolic (congestive) heart failure: Secondary | ICD-10-CM | POA: Diagnosis not present

## 2017-02-26 DIAGNOSIS — S81812D Laceration without foreign body, left lower leg, subsequent encounter: Secondary | ICD-10-CM | POA: Diagnosis not present

## 2017-02-26 DIAGNOSIS — I480 Paroxysmal atrial fibrillation: Secondary | ICD-10-CM | POA: Diagnosis not present

## 2017-02-26 DIAGNOSIS — S81802A Unspecified open wound, left lower leg, initial encounter: Secondary | ICD-10-CM | POA: Diagnosis not present

## 2017-02-26 DIAGNOSIS — M1990 Unspecified osteoarthritis, unspecified site: Secondary | ICD-10-CM | POA: Diagnosis not present

## 2017-02-26 DIAGNOSIS — I110 Hypertensive heart disease with heart failure: Secondary | ICD-10-CM | POA: Diagnosis not present

## 2017-03-05 ENCOUNTER — Encounter (HOSPITAL_BASED_OUTPATIENT_CLINIC_OR_DEPARTMENT_OTHER): Payer: Medicare Other | Attending: Internal Medicine

## 2017-03-05 DIAGNOSIS — I509 Heart failure, unspecified: Secondary | ICD-10-CM | POA: Diagnosis not present

## 2017-03-05 DIAGNOSIS — X58XXXD Exposure to other specified factors, subsequent encounter: Secondary | ICD-10-CM | POA: Insufficient documentation

## 2017-03-05 DIAGNOSIS — I87312 Chronic venous hypertension (idiopathic) with ulcer of left lower extremity: Secondary | ICD-10-CM | POA: Diagnosis not present

## 2017-03-05 DIAGNOSIS — L97822 Non-pressure chronic ulcer of other part of left lower leg with fat layer exposed: Secondary | ICD-10-CM | POA: Insufficient documentation

## 2017-03-05 DIAGNOSIS — I87332 Chronic venous hypertension (idiopathic) with ulcer and inflammation of left lower extremity: Secondary | ICD-10-CM | POA: Diagnosis not present

## 2017-03-05 DIAGNOSIS — I110 Hypertensive heart disease with heart failure: Secondary | ICD-10-CM | POA: Insufficient documentation

## 2017-03-05 DIAGNOSIS — S81812D Laceration without foreign body, left lower leg, subsequent encounter: Secondary | ICD-10-CM | POA: Diagnosis not present

## 2017-03-05 DIAGNOSIS — M1990 Unspecified osteoarthritis, unspecified site: Secondary | ICD-10-CM | POA: Diagnosis not present

## 2017-03-09 ENCOUNTER — Other Ambulatory Visit: Payer: Self-pay | Admitting: Nurse Practitioner

## 2017-03-12 DIAGNOSIS — I87312 Chronic venous hypertension (idiopathic) with ulcer of left lower extremity: Secondary | ICD-10-CM | POA: Diagnosis not present

## 2017-03-12 DIAGNOSIS — L97822 Non-pressure chronic ulcer of other part of left lower leg with fat layer exposed: Secondary | ICD-10-CM | POA: Diagnosis not present

## 2017-03-12 DIAGNOSIS — S81812D Laceration without foreign body, left lower leg, subsequent encounter: Secondary | ICD-10-CM | POA: Diagnosis not present

## 2017-03-12 DIAGNOSIS — I509 Heart failure, unspecified: Secondary | ICD-10-CM | POA: Diagnosis not present

## 2017-03-12 DIAGNOSIS — M1990 Unspecified osteoarthritis, unspecified site: Secondary | ICD-10-CM | POA: Diagnosis not present

## 2017-03-12 DIAGNOSIS — I110 Hypertensive heart disease with heart failure: Secondary | ICD-10-CM | POA: Diagnosis not present

## 2017-03-12 DIAGNOSIS — I87332 Chronic venous hypertension (idiopathic) with ulcer and inflammation of left lower extremity: Secondary | ICD-10-CM | POA: Diagnosis not present

## 2017-03-19 DIAGNOSIS — I87332 Chronic venous hypertension (idiopathic) with ulcer and inflammation of left lower extremity: Secondary | ICD-10-CM | POA: Diagnosis not present

## 2017-03-19 DIAGNOSIS — I509 Heart failure, unspecified: Secondary | ICD-10-CM | POA: Diagnosis not present

## 2017-03-19 DIAGNOSIS — M1990 Unspecified osteoarthritis, unspecified site: Secondary | ICD-10-CM | POA: Diagnosis not present

## 2017-03-19 DIAGNOSIS — S81812D Laceration without foreign body, left lower leg, subsequent encounter: Secondary | ICD-10-CM | POA: Diagnosis not present

## 2017-03-19 DIAGNOSIS — L97822 Non-pressure chronic ulcer of other part of left lower leg with fat layer exposed: Secondary | ICD-10-CM | POA: Diagnosis not present

## 2017-03-19 DIAGNOSIS — I110 Hypertensive heart disease with heart failure: Secondary | ICD-10-CM | POA: Diagnosis not present

## 2017-03-26 DIAGNOSIS — I509 Heart failure, unspecified: Secondary | ICD-10-CM | POA: Diagnosis not present

## 2017-03-26 DIAGNOSIS — L97822 Non-pressure chronic ulcer of other part of left lower leg with fat layer exposed: Secondary | ICD-10-CM | POA: Diagnosis not present

## 2017-03-26 DIAGNOSIS — I110 Hypertensive heart disease with heart failure: Secondary | ICD-10-CM | POA: Diagnosis not present

## 2017-03-26 DIAGNOSIS — M1990 Unspecified osteoarthritis, unspecified site: Secondary | ICD-10-CM | POA: Diagnosis not present

## 2017-03-26 DIAGNOSIS — I87332 Chronic venous hypertension (idiopathic) with ulcer and inflammation of left lower extremity: Secondary | ICD-10-CM | POA: Diagnosis not present

## 2017-03-26 DIAGNOSIS — S81812D Laceration without foreign body, left lower leg, subsequent encounter: Secondary | ICD-10-CM | POA: Diagnosis not present

## 2017-03-26 DIAGNOSIS — I87312 Chronic venous hypertension (idiopathic) with ulcer of left lower extremity: Secondary | ICD-10-CM | POA: Diagnosis not present

## 2017-03-31 ENCOUNTER — Encounter: Payer: Self-pay | Admitting: Nurse Practitioner

## 2017-03-31 DIAGNOSIS — I10 Essential (primary) hypertension: Secondary | ICD-10-CM | POA: Diagnosis not present

## 2017-03-31 DIAGNOSIS — M19042 Primary osteoarthritis, left hand: Secondary | ICD-10-CM | POA: Diagnosis not present

## 2017-03-31 DIAGNOSIS — I482 Chronic atrial fibrillation: Secondary | ICD-10-CM | POA: Diagnosis not present

## 2017-03-31 DIAGNOSIS — M19041 Primary osteoarthritis, right hand: Secondary | ICD-10-CM | POA: Diagnosis not present

## 2017-03-31 DIAGNOSIS — S81812D Laceration without foreign body, left lower leg, subsequent encounter: Secondary | ICD-10-CM | POA: Diagnosis not present

## 2017-03-31 DIAGNOSIS — I87322 Chronic venous hypertension (idiopathic) with inflammation of left lower extremity: Secondary | ICD-10-CM | POA: Diagnosis not present

## 2017-04-02 ENCOUNTER — Encounter (HOSPITAL_BASED_OUTPATIENT_CLINIC_OR_DEPARTMENT_OTHER): Payer: Medicare Other | Attending: Internal Medicine

## 2017-04-02 DIAGNOSIS — I87332 Chronic venous hypertension (idiopathic) with ulcer and inflammation of left lower extremity: Secondary | ICD-10-CM | POA: Insufficient documentation

## 2017-04-02 DIAGNOSIS — I509 Heart failure, unspecified: Secondary | ICD-10-CM | POA: Diagnosis not present

## 2017-04-02 DIAGNOSIS — I110 Hypertensive heart disease with heart failure: Secondary | ICD-10-CM | POA: Insufficient documentation

## 2017-04-02 DIAGNOSIS — D649 Anemia, unspecified: Secondary | ICD-10-CM | POA: Insufficient documentation

## 2017-04-02 DIAGNOSIS — L97822 Non-pressure chronic ulcer of other part of left lower leg with fat layer exposed: Secondary | ICD-10-CM | POA: Insufficient documentation

## 2017-04-02 DIAGNOSIS — I87312 Chronic venous hypertension (idiopathic) with ulcer of left lower extremity: Secondary | ICD-10-CM | POA: Diagnosis not present

## 2017-04-06 DIAGNOSIS — M19041 Primary osteoarthritis, right hand: Secondary | ICD-10-CM | POA: Diagnosis not present

## 2017-04-06 DIAGNOSIS — M19042 Primary osteoarthritis, left hand: Secondary | ICD-10-CM | POA: Diagnosis not present

## 2017-04-06 DIAGNOSIS — I87322 Chronic venous hypertension (idiopathic) with inflammation of left lower extremity: Secondary | ICD-10-CM | POA: Diagnosis not present

## 2017-04-06 DIAGNOSIS — I10 Essential (primary) hypertension: Secondary | ICD-10-CM | POA: Diagnosis not present

## 2017-04-06 DIAGNOSIS — S81812D Laceration without foreign body, left lower leg, subsequent encounter: Secondary | ICD-10-CM | POA: Diagnosis not present

## 2017-04-06 DIAGNOSIS — I482 Chronic atrial fibrillation: Secondary | ICD-10-CM | POA: Diagnosis not present

## 2017-04-09 DIAGNOSIS — D649 Anemia, unspecified: Secondary | ICD-10-CM | POA: Diagnosis not present

## 2017-04-09 DIAGNOSIS — I110 Hypertensive heart disease with heart failure: Secondary | ICD-10-CM | POA: Diagnosis not present

## 2017-04-09 DIAGNOSIS — L97822 Non-pressure chronic ulcer of other part of left lower leg with fat layer exposed: Secondary | ICD-10-CM | POA: Diagnosis not present

## 2017-04-09 DIAGNOSIS — I87332 Chronic venous hypertension (idiopathic) with ulcer and inflammation of left lower extremity: Secondary | ICD-10-CM | POA: Diagnosis not present

## 2017-04-09 DIAGNOSIS — I509 Heart failure, unspecified: Secondary | ICD-10-CM | POA: Diagnosis not present

## 2017-04-12 DIAGNOSIS — M545 Low back pain: Secondary | ICD-10-CM | POA: Diagnosis not present

## 2017-04-12 DIAGNOSIS — M5441 Lumbago with sciatica, right side: Secondary | ICD-10-CM | POA: Diagnosis not present

## 2017-04-13 DIAGNOSIS — I87322 Chronic venous hypertension (idiopathic) with inflammation of left lower extremity: Secondary | ICD-10-CM | POA: Diagnosis not present

## 2017-04-13 DIAGNOSIS — I482 Chronic atrial fibrillation: Secondary | ICD-10-CM | POA: Diagnosis not present

## 2017-04-13 DIAGNOSIS — M19042 Primary osteoarthritis, left hand: Secondary | ICD-10-CM | POA: Diagnosis not present

## 2017-04-13 DIAGNOSIS — S81812D Laceration without foreign body, left lower leg, subsequent encounter: Secondary | ICD-10-CM | POA: Diagnosis not present

## 2017-04-13 DIAGNOSIS — I10 Essential (primary) hypertension: Secondary | ICD-10-CM | POA: Diagnosis not present

## 2017-04-13 DIAGNOSIS — M19041 Primary osteoarthritis, right hand: Secondary | ICD-10-CM | POA: Diagnosis not present

## 2017-04-16 DIAGNOSIS — I87332 Chronic venous hypertension (idiopathic) with ulcer and inflammation of left lower extremity: Secondary | ICD-10-CM | POA: Diagnosis not present

## 2017-04-16 DIAGNOSIS — D649 Anemia, unspecified: Secondary | ICD-10-CM | POA: Diagnosis not present

## 2017-04-16 DIAGNOSIS — L97822 Non-pressure chronic ulcer of other part of left lower leg with fat layer exposed: Secondary | ICD-10-CM | POA: Diagnosis not present

## 2017-04-16 DIAGNOSIS — I509 Heart failure, unspecified: Secondary | ICD-10-CM | POA: Diagnosis not present

## 2017-04-16 DIAGNOSIS — I87312 Chronic venous hypertension (idiopathic) with ulcer of left lower extremity: Secondary | ICD-10-CM | POA: Diagnosis not present

## 2017-04-16 DIAGNOSIS — I110 Hypertensive heart disease with heart failure: Secondary | ICD-10-CM | POA: Diagnosis not present

## 2017-04-20 DIAGNOSIS — I482 Chronic atrial fibrillation: Secondary | ICD-10-CM | POA: Diagnosis not present

## 2017-04-20 DIAGNOSIS — S81812D Laceration without foreign body, left lower leg, subsequent encounter: Secondary | ICD-10-CM | POA: Diagnosis not present

## 2017-04-20 DIAGNOSIS — I10 Essential (primary) hypertension: Secondary | ICD-10-CM | POA: Diagnosis not present

## 2017-04-20 DIAGNOSIS — I87322 Chronic venous hypertension (idiopathic) with inflammation of left lower extremity: Secondary | ICD-10-CM | POA: Diagnosis not present

## 2017-04-20 DIAGNOSIS — M19042 Primary osteoarthritis, left hand: Secondary | ICD-10-CM | POA: Diagnosis not present

## 2017-04-20 DIAGNOSIS — M19041 Primary osteoarthritis, right hand: Secondary | ICD-10-CM | POA: Diagnosis not present

## 2017-04-21 ENCOUNTER — Encounter: Payer: Self-pay | Admitting: Nurse Practitioner

## 2017-04-21 ENCOUNTER — Encounter (INDEPENDENT_AMBULATORY_CARE_PROVIDER_SITE_OTHER): Payer: Self-pay

## 2017-04-21 ENCOUNTER — Ambulatory Visit (INDEPENDENT_AMBULATORY_CARE_PROVIDER_SITE_OTHER): Payer: Medicare Other | Admitting: Nurse Practitioner

## 2017-04-21 VITALS — BP 138/62 | HR 69 | Ht 59.0 in | Wt 122.4 lb

## 2017-04-21 DIAGNOSIS — Z79899 Other long term (current) drug therapy: Secondary | ICD-10-CM | POA: Diagnosis not present

## 2017-04-21 DIAGNOSIS — I480 Paroxysmal atrial fibrillation: Secondary | ICD-10-CM

## 2017-04-21 MED ORDER — RIVAROXABAN 15 MG PO TABS: 15.0000 mg | ORAL_TABLET | Freq: Every day | ORAL | 6 refills | Status: DC

## 2017-04-21 NOTE — Progress Notes (Signed)
CARDIOLOGY OFFICE NOTE  Date:  04/21/2017    [name] Date of Birth: 01/28/1928 Medical Record #[id number]  PCP:  Lajean Manes, MD  Cardiologist:  Atilano Median    Chief Complaint  Patient presents with  . Atrial Fibrillation    Follow up visit - seen for Dr. Lovena Le    History of Present Illness: JESSYCA SLOAN is a 83 y.o. female who presents today for a follow up visit. Seen for Dr. Lovena Le. Former patient of Dr. Susa Simmonds. I previously took care of her husband [name] for many years.   She has a history of paroxysmal atrial fibrillation, prior stroke, dyslipidemia, hypertension, and diastolic heart failure. The patient has rheumatoid arthritis. Her atrial arrhythmias have beenwell-controlled. She remains on chronic anticoagulation.   Last seen by Dr. Lovena Le back in May of 2016 - felt to be doing ok from our standpoint.  I saw her back September of 2016 - lots more swelling in her legs - worse after being switched over to Verapamil. Good EF by echo.   She was hospitalized back in July of 2017 with acute on chronic diastolic HF. HR initially elevated but improved with metoprolol. She was in atrial fib - looks to me like this was probably the trigger for her demise. Looks like itwas assumed she hadchronic AF - however she was in NSR on EKG last year. Now on Xarelto for her anticoagulation. Metoprolol dose was increased along with her Lasix.   I saw her back for follow up - I felt like her AF was the probable trigger for recent exacerbation. Discussed with Dr. Lovena Le and we elected to place her on amiodarone therapy - low dose. She has done ok - we have been able to cut her beta blocker back due to bradycardia.   I saw her back in November after endorsing more chest pain. We opted to continue with medical management. Last seen back in February and she was doing ok - we had stopped her Metoprolol and seemed to have more energy and HR was improved. Still concerned about hair  loss - I really did not have an answer for this.   Comes in today. Here alone. She is still dealing with a wound on her lower left leg - this has been going on for over 4 months - has had wound care at her house and now going to the outpatient facility. It is improving. Her rhythm "throws her sometimes but not drastically". Overall she seems like she is ok. Remains on very low dose amiodarone. She has some swelling as a result of the leg injury but this seems to be improving. Her weight is stable. No falls. Not dizzy. No chest pain. She will get short of breath with exertion - this is chronic.  Her biggest issue is nocturia - sounds like she has tried Myrbetriq with no improvement.   Past Medical History:  Diagnosis Date  . Arthritis    severe  . Atrial fibrillation ([place])   . Chronic diastolic CHF (congestive heart failure) ([place])   . Chronic insomnia   . Degenerative arthritis   . Essential hypertension   . Hypercholesteremia   . Memory deficit 05/24/2013  . Osteoporosis   . PAF (paroxysmal atrial fibrillation) ([place])    a. Intolerant to amiodarone, discontinued October 2012.  Marland Kitchen RLS (restless legs syndrome) 08/11/2016  . Sinus bradycardia   . Stroke [facility]) 2007   mini stroke or TIA with aphasia  Past Surgical History:  Procedure Laterality Date  . CATARACT EXTRACTION  2009   ou  . FEMUR FRACTURE SURGERY  8/12  . left knee  2001  . REPLACEMENT TOTAL KNEE BILATERAL Right 1999  . right intertrochanteric hip fracture status post ORIF  August 2012  . right shoulder replacement  2009     Medications: Current Outpatient Prescriptions  Medication Sig Dispense Refill  . alclomethasone (ACLOVATE) 0.05 % cream Apply 1 application topically as needed (rash/redness).     Marland Kitchen amiodarone (PACERONE) 100 MG tablet TAKE 1 TABLET(100 MG) BY MOUTH EVERY DAY 30 tablet 6  . BELSOMRA 20 MG TABS TAKE 1 TABLET BY MOUTH AT BEDTIME AS NEEDED 30 tablet 3  . Cholecalciferol (VITAMIN D-3 PO) Take 1  capsule by mouth daily.    . Cyanocobalamin (VITAMIN B-12 PO) Take 1 tablet by mouth daily.    . furosemide (LASIX) 40 MG tablet Take 1 tablet (40 mg total) by mouth daily. May take an extra 20 mg prn weight gain of 2 pounds overnight. 45 tablet 9  . gabapentin (NEURONTIN) 300 MG capsule Take 300 mg by mouth once as needed.    . Melatonin 3 MG TABS Take 6 mg by mouth at bedtime as needed (for sleep).    . nitroGLYCERIN (NITROSTAT) 0.4 MG SL tablet Place 1 tablet (0.4 mg total) under the tongue every 5 (five) minutes as needed for chest pain. 25 tablet 1  . pramipexole (MIRAPEX) 0.125 MG tablet Take 1 tablet (0.125 mg total) by mouth at bedtime. 30 tablet 11  . RESTASIS 0.05 % ophthalmic emulsion Place 1 drop into both eyes 2 (two) times daily.    . Rivaroxaban (XARELTO) 15 MG TABS tablet Take 1 tablet (15 mg total) by mouth daily with supper. 30 tablet 6  . rivastigmine (EXELON) 3 MG capsule Take 1 capsule (3 mg total) by mouth 2 (two) times daily. 60 capsule 11  . traMADol (ULTRAM) 50 MG tablet Take 50 mg by mouth 2 (two) times daily as needed (for pain). Reported on 01/22/2016    . VOLTAREN 1 % GEL Apply 1 application topically daily as needed (for leg pain).   3   No current facility-administered medications for this visit.     Allergies: Allergies  Allergen Reactions  . Codeine Nausea Only    Social History: The patient  reports that she has never smoked. She has never used smokeless tobacco. She reports that she drinks about 4.2 oz of alcohol per week . She reports that she does not use drugs.   Family History: The patient's family history includes Cancer in her father, sister, sister, and son; Cancer - Colon in her father; Dementia in her mother; Stroke in her mother.   Review of Systems: Please see the history of present illness.   Otherwise, the review of systems is positive for none.   All other systems are reviewed and negative.   Physical Exam: VS:  BP 138/62 (BP Location:  Left Arm, Patient Position: Sitting, Cuff Size: Normal)   Pulse 69   Ht 4\' 11"  (1.499 m)   Wt 122 lb 6.4 oz (55.5 kg)   BMI 24.72 kg/m  .  BMI Body mass index is 24.72 kg/m.  Wt Readings from Last 3 Encounters:  04/21/17 122 lb 6.4 oz (55.5 kg)  02/09/17 122 lb 12.8 oz (55.7 kg)  12/22/16 123 lb 1.9 oz (55.8 kg)    General: Pleasant. Elderly female. She is alert and in no  acute distress.   HEENT: Normal.  Neck: Supple, no JVD, carotid bruits, or masses noted.  Cardiac: Regular rate and rhythm. No murmurs, rubs, or gallops. No edema.  Respiratory:  Lungs are clear to auscultation bilaterally with normal work of breathing.  GI: Soft and nontender.  MS: No deformity or atrophy. Gait and ROM intact.  Skin: Warm and dry. Color is normal.  Neuro:  Strength and sensation are intact and no gross focal deficits noted.  Psych: Alert, appropriate and with normal affect.   LABORATORY DATA:  EKG:  EKG is ordered today. This demonstrates NSR.  Lab Results  Component Value Date   WBC 5.5 12/22/2016   HGB 11.2 12/22/2016   HCT 34.5 12/22/2016   PLT 166 12/22/2016   GLUCOSE 123 (H) 12/22/2016   ALT 16 12/22/2016   AST 21 12/22/2016   NA 141 12/22/2016   K 4.6 12/22/2016   CL 98 12/22/2016   CREATININE 1.03 (H) 12/22/2016   BUN 27 12/22/2016   CO2 30 (H) 12/22/2016   TSH 0.838 12/22/2016   INR 1.26 05/26/2016     BNP (last 3 results)  Recent Labs  05/26/16 1425 06/29/16 1606  BNP 702.6* 322.5*    ProBNP (last 3 results) No results for input(s): PROBNP in the last 8760 hours.   Other Studies Reviewed Today:  Echo Study Conclusions from 03/2015  - Left ventricle: The cavity size was normal. Wall thickness was increased in a pattern of mild LVH. Systolic function was normal. The estimated ejection fraction was in the range of 55% to 60%. Doppler parameters are consistent with abnormal left ventricular relaxation (grade 1 diastolic dysfunction). - Aortic  valve: There was mild regurgitation. - Mitral valve: Calcified annulus. Mildly thickened leaflets . There was mild regurgitation. - Atrial septum: No defect or patent foramen ovale was identified. - Pulmonary arteries: PA peak pressure: 35 mm Hg (S).   Lower Extremity Duplex Summary: No evidence of deep vein thrombosis involving the right common femoral vein and left lower extremity.  Other specific details can be found in the table(s) above. Prepared and Electronically Authenticated by  Gae Gallop MD 2016-09-02T14:05:48   Assessment/Plan:  1. Complains of hair loss - not discussed today.   2. Chest pain - currently not an issue - would favor continued conservative management.  3. Chronic diastolic HF - with past exacerbation that I suspected was due to recurrent AF with RVR.  EKG today shows she remains in NSR. Only on amiodarone back to just 100 mg a day andno longer on her Metoprolol.   4. PAF - back in NSR by exam and by EKG today. HR has improved.   5. Chronic anticoagulation - no problems noted. I checked her creatinine clearance at prior visit it was 33 - confirmed with the pharmacist - needs to be on 15 mg of Xarelto - her dose was changed. Recheck labs today.   6. CKD   7. Advanced age.   Current medicines are reviewed with the patient today.  The patient does not have concerns regarding medicines other than what has been noted above.  The following changes have been made:  See above.  Labs/ tests ordered today include:    Orders Placed This Encounter  Procedures  . Basic metabolic panel  . CBC  . Hepatic function panel  . TSH  . EKG 12-Lead     Disposition:   FU with me in 6 months with EKG and labs.  Patient is agreeable  to this plan and will call if any problems develop in the interim.   SignedTruitt Merle, NP  04/21/2017 3:33 PM  [facility] [address] [place], [place]   [address] Phone: [phone] Fax: [phone]

## 2017-04-21 NOTE — Patient Instructions (Addendum)
We will be checking the following labs today - BMET, CBC, HPF and TSH   Medication Instructions:    Continue with your current medicines.     Testing/Procedures To Be Arranged:  N/A  Follow-Up:   See me in 6 months    Other Special Instructions:   N/A    If you need a refill on your cardiac medications before your next appointment, please call your pharmacy.   Call the [place] office at [phone] if you have any questions, problems or concerns.

## 2017-04-22 LAB — CBC
Hematocrit: 27.7 % — ABNORMAL LOW (ref 34.0–46.6)
Hemoglobin: 8.6 g/dL — ABNORMAL LOW (ref 11.1–15.9)
MCH: 25.4 pg — ABNORMAL LOW (ref 26.6–33.0)
MCHC: 31 g/dL — ABNORMAL LOW (ref 31.5–35.7)
MCV: 82 fL (ref 79–97)
Platelets: 181 10*3/uL (ref 150–379)
RBC: 3.39 x10E6/uL — ABNORMAL LOW (ref 3.77–5.28)
RDW: 15.3 % (ref 12.3–15.4)
WBC: 6 10*3/uL (ref 3.4–10.8)

## 2017-04-22 LAB — TSH: TSH: 1.57 u[IU]/mL (ref 0.450–4.500)

## 2017-04-22 LAB — BASIC METABOLIC PANEL
BUN/Creatinine Ratio: 27 (ref 12–28)
BUN: 26 mg/dL (ref 8–27)
CO2: 22 mmol/L (ref 20–29)
Calcium: 9.1 mg/dL (ref 8.7–10.3)
Chloride: 96 mmol/L (ref 96–106)
Creatinine, Ser: 0.98 mg/dL (ref 0.57–1.00)
GFR calc Af Amer: 60 mL/min/{1.73_m2} (ref >59)
GFR calc non Af Amer: 52 mL/min/{1.73_m2} — ABNORMAL LOW (ref >59)
Glucose: 115 mg/dL — ABNORMAL HIGH (ref 65–99)
Potassium: 4.9 mmol/L (ref 3.5–5.2)
Sodium: 137 mmol/L (ref 134–144)

## 2017-04-22 LAB — HEPATIC FUNCTION PANEL
ALT: 13 IU/L (ref 0–32)
AST: 21 IU/L (ref 0–40)
Albumin: 4.1 g/dL (ref 3.5–4.7)
Alkaline Phosphatase: 59 IU/L (ref 39–117)
Bilirubin Total: <0.2 mg/dL (ref 0.0–1.2)
Bilirubin, Direct: 0.07 mg/dL (ref 0.00–0.40)
Total Protein: 6.5 g/dL (ref 6.0–8.5)

## 2017-04-23 DIAGNOSIS — D539 Nutritional anemia, unspecified: Secondary | ICD-10-CM | POA: Diagnosis not present

## 2017-04-23 DIAGNOSIS — L97822 Non-pressure chronic ulcer of other part of left lower leg with fat layer exposed: Secondary | ICD-10-CM | POA: Diagnosis not present

## 2017-04-23 DIAGNOSIS — I110 Hypertensive heart disease with heart failure: Secondary | ICD-10-CM | POA: Diagnosis not present

## 2017-04-23 DIAGNOSIS — I509 Heart failure, unspecified: Secondary | ICD-10-CM | POA: Diagnosis not present

## 2017-04-23 DIAGNOSIS — D649 Anemia, unspecified: Secondary | ICD-10-CM | POA: Diagnosis not present

## 2017-04-23 DIAGNOSIS — I87332 Chronic venous hypertension (idiopathic) with ulcer and inflammation of left lower extremity: Secondary | ICD-10-CM | POA: Diagnosis not present

## 2017-04-27 DIAGNOSIS — S81812D Laceration without foreign body, left lower leg, subsequent encounter: Secondary | ICD-10-CM | POA: Diagnosis not present

## 2017-04-27 DIAGNOSIS — I87322 Chronic venous hypertension (idiopathic) with inflammation of left lower extremity: Secondary | ICD-10-CM | POA: Diagnosis not present

## 2017-04-27 DIAGNOSIS — I10 Essential (primary) hypertension: Secondary | ICD-10-CM | POA: Diagnosis not present

## 2017-04-27 DIAGNOSIS — I482 Chronic atrial fibrillation: Secondary | ICD-10-CM | POA: Diagnosis not present

## 2017-04-27 DIAGNOSIS — M19042 Primary osteoarthritis, left hand: Secondary | ICD-10-CM | POA: Diagnosis not present

## 2017-04-27 DIAGNOSIS — M19041 Primary osteoarthritis, right hand: Secondary | ICD-10-CM | POA: Diagnosis not present

## 2017-04-28 ENCOUNTER — Telehealth: Payer: Self-pay | Admitting: *Deleted

## 2017-04-28 NOTE — Telephone Encounter (Signed)
Please let her know that I'd like to know the results of her stool test - this will help determine whether we restart the Xarelto but continue to hold for now.

## 2017-04-28 NOTE — Telephone Encounter (Signed)
S/w pt did proceed with the care of Dr. Felipa Eth  due to decreasing hgb the same day our office called pt.  Pt is doing everything she can, eating apricots and liverwurst. Repeated lab work at Dr. Felipa Eth hgb is now at 38.  Pt also was placed on FE bid.  Pt had another repeat of labs on Tuesday through [facility] and still has not heard about the results. Next step is Fecal card panel.  Will send to [place] to [place].

## 2017-04-28 NOTE — Telephone Encounter (Signed)
I do not think it is in her best interest to be on Xarelto in the setting of anemia that is probably coming from a GI bleed. Will see what her next lab shows. She is currently in NSR.

## 2017-04-28 NOTE — Telephone Encounter (Signed)
Pt is aware to call office with next lab result.  Has not been given the fecal card. That will be pt's next step.  Pt is nervous not being on xarelto.  Will send to [place] to [place].

## 2017-04-30 DIAGNOSIS — I87332 Chronic venous hypertension (idiopathic) with ulcer and inflammation of left lower extremity: Secondary | ICD-10-CM | POA: Diagnosis not present

## 2017-04-30 DIAGNOSIS — I87311 Chronic venous hypertension (idiopathic) with ulcer of right lower extremity: Secondary | ICD-10-CM | POA: Diagnosis not present

## 2017-04-30 DIAGNOSIS — L97822 Non-pressure chronic ulcer of other part of left lower leg with fat layer exposed: Secondary | ICD-10-CM | POA: Diagnosis not present

## 2017-04-30 DIAGNOSIS — I509 Heart failure, unspecified: Secondary | ICD-10-CM | POA: Diagnosis not present

## 2017-04-30 DIAGNOSIS — I110 Hypertensive heart disease with heart failure: Secondary | ICD-10-CM | POA: Diagnosis not present

## 2017-04-30 DIAGNOSIS — D649 Anemia, unspecified: Secondary | ICD-10-CM | POA: Diagnosis not present

## 2017-05-04 ENCOUNTER — Telehealth: Payer: Self-pay | Admitting: Nurse Practitioner

## 2017-05-04 DIAGNOSIS — M19041 Primary osteoarthritis, right hand: Secondary | ICD-10-CM | POA: Diagnosis not present

## 2017-05-04 DIAGNOSIS — M19042 Primary osteoarthritis, left hand: Secondary | ICD-10-CM | POA: Diagnosis not present

## 2017-05-04 DIAGNOSIS — S81812D Laceration without foreign body, left lower leg, subsequent encounter: Secondary | ICD-10-CM | POA: Diagnosis not present

## 2017-05-04 DIAGNOSIS — I482 Chronic atrial fibrillation: Secondary | ICD-10-CM | POA: Diagnosis not present

## 2017-05-04 DIAGNOSIS — I87322 Chronic venous hypertension (idiopathic) with inflammation of left lower extremity: Secondary | ICD-10-CM | POA: Diagnosis not present

## 2017-05-04 DIAGNOSIS — I10 Essential (primary) hypertension: Secondary | ICD-10-CM | POA: Diagnosis not present

## 2017-05-04 NOTE — Telephone Encounter (Signed)
Spoke with patient who called with multiple complaints. She states her main concern is that she is currently off Xarelto due to anemia. She states she wants to go back on it because she believes the reason she is now in a fib is because she is no longer on the Xarelto. I advised her that the Xarelto is not an anti-arrhythmic but she insists that the regimen she was on at her last ov with [name], NP on 6/20 was working well and that these problems have arisen due to stopping Xarelto. She states today BP taken by wound care nurse was 200/100; states he eventually got it down to 188/100 mmHg. She states 2 nights ago she took 2 NTG pills 20 minutes apart which resolved chest discomfort. Thinks her chest discomfort is possibly related to irregular heart rate. I attempted to further sort through the details of her PCP involvement with patient's anemia and she asked me to just ask [name] if she can go back on the Xarelto. She states she is taking ferrous sulfate twice daily eating a high iron diet and per Dr. Carlyle Lipa nurse her Hgb is now up to 9.9. I advised her that I will discuss with [name], NP and call her back with her advice.

## 2017-05-04 NOTE — Telephone Encounter (Signed)
New message      Pt c/o BP issue: STAT if pt c/o blurred vision, one-sided weakness or slurred speech  1. What are your last 5 BP readings?  220/? 188/?  2. Are you having any other symptoms (ex. Dizziness, headache, blurred vision, passed out)? Palpitations, pt thinks she is in AFIB, chest pressure   3. What is your BP issue? She has been taken off of Xarelto 10 days ago , her bp was 220/? The nurse got it down to 188/? , they stopped the Xarelto because she was anemic and she is concerned she is going to have a stroke if she doesnt start taking the Xarelto again  Pt c/o of Chest Pain: STAT if CP now or developed within 24 hours  1. Are you having CP right now?  no  2. Are you experiencing any other symptoms (ex. SOB, nausea, vomiting, sweating)?  Yes, sob with exertion,   3. How long have you been experiencing CP? no  4. Is your CP continuous or coming and going?  When she is laying down  5. Have you taken Nitroglycerin?  2 nights ago  ?

## 2017-05-04 NOTE — Telephone Encounter (Signed)
I called Dr. Carlyle Lipa office and requested most recent CBC. I confirmed with the medical records representative that the patient has not been seen in the office since April 2018. She verified that the patient had a CBC on 6/22 which I received by fax. I reviewed with [name], NP who advised that since patient's hgb remains low at 9.0 g/dL, she is not comfortable restarting Xarelto at this time. I spoke with the patient who states she is scheduled to f/u with Dr. Felipa Eth on 7/23 and have repeat CBC. She is in the process of completing her hemocult cards. I advised her to call back after the appointment or with the results of the hemocult cards for advice on restarting Xarelto per [name]. Patient states Dr. Felipa Eth also called in a new BP medication. I advised her to follow his advice for that medication. She verbalized understanding and agreement with plan and thanked me for the call.

## 2017-05-07 ENCOUNTER — Encounter (HOSPITAL_BASED_OUTPATIENT_CLINIC_OR_DEPARTMENT_OTHER): Payer: Medicare Other | Attending: Internal Medicine

## 2017-05-07 DIAGNOSIS — L97822 Non-pressure chronic ulcer of other part of left lower leg with fat layer exposed: Secondary | ICD-10-CM | POA: Insufficient documentation

## 2017-05-07 DIAGNOSIS — I110 Hypertensive heart disease with heart failure: Secondary | ICD-10-CM | POA: Diagnosis not present

## 2017-05-07 DIAGNOSIS — I87332 Chronic venous hypertension (idiopathic) with ulcer and inflammation of left lower extremity: Secondary | ICD-10-CM | POA: Insufficient documentation

## 2017-05-07 DIAGNOSIS — I509 Heart failure, unspecified: Secondary | ICD-10-CM | POA: Insufficient documentation

## 2017-05-11 DIAGNOSIS — M19041 Primary osteoarthritis, right hand: Secondary | ICD-10-CM | POA: Diagnosis not present

## 2017-05-11 DIAGNOSIS — I482 Chronic atrial fibrillation: Secondary | ICD-10-CM | POA: Diagnosis not present

## 2017-05-11 DIAGNOSIS — I87322 Chronic venous hypertension (idiopathic) with inflammation of left lower extremity: Secondary | ICD-10-CM | POA: Diagnosis not present

## 2017-05-11 DIAGNOSIS — D539 Nutritional anemia, unspecified: Secondary | ICD-10-CM | POA: Diagnosis not present

## 2017-05-11 DIAGNOSIS — I10 Essential (primary) hypertension: Secondary | ICD-10-CM | POA: Diagnosis not present

## 2017-05-11 DIAGNOSIS — M19042 Primary osteoarthritis, left hand: Secondary | ICD-10-CM | POA: Diagnosis not present

## 2017-05-11 DIAGNOSIS — S81812D Laceration without foreign body, left lower leg, subsequent encounter: Secondary | ICD-10-CM | POA: Diagnosis not present

## 2017-05-14 DIAGNOSIS — I110 Hypertensive heart disease with heart failure: Secondary | ICD-10-CM | POA: Diagnosis not present

## 2017-05-14 DIAGNOSIS — L97822 Non-pressure chronic ulcer of other part of left lower leg with fat layer exposed: Secondary | ICD-10-CM | POA: Diagnosis not present

## 2017-05-14 DIAGNOSIS — I87332 Chronic venous hypertension (idiopathic) with ulcer and inflammation of left lower extremity: Secondary | ICD-10-CM | POA: Diagnosis not present

## 2017-05-14 DIAGNOSIS — I509 Heart failure, unspecified: Secondary | ICD-10-CM | POA: Diagnosis not present

## 2017-05-14 DIAGNOSIS — I87312 Chronic venous hypertension (idiopathic) with ulcer of left lower extremity: Secondary | ICD-10-CM | POA: Diagnosis not present

## 2017-05-18 DIAGNOSIS — M19041 Primary osteoarthritis, right hand: Secondary | ICD-10-CM | POA: Diagnosis not present

## 2017-05-18 DIAGNOSIS — M19042 Primary osteoarthritis, left hand: Secondary | ICD-10-CM | POA: Diagnosis not present

## 2017-05-18 DIAGNOSIS — I482 Chronic atrial fibrillation: Secondary | ICD-10-CM | POA: Diagnosis not present

## 2017-05-18 DIAGNOSIS — S81812D Laceration without foreign body, left lower leg, subsequent encounter: Secondary | ICD-10-CM | POA: Diagnosis not present

## 2017-05-18 DIAGNOSIS — I87322 Chronic venous hypertension (idiopathic) with inflammation of left lower extremity: Secondary | ICD-10-CM | POA: Diagnosis not present

## 2017-05-18 DIAGNOSIS — I10 Essential (primary) hypertension: Secondary | ICD-10-CM | POA: Diagnosis not present

## 2017-05-21 DIAGNOSIS — L97822 Non-pressure chronic ulcer of other part of left lower leg with fat layer exposed: Secondary | ICD-10-CM | POA: Diagnosis not present

## 2017-05-21 DIAGNOSIS — I87332 Chronic venous hypertension (idiopathic) with ulcer and inflammation of left lower extremity: Secondary | ICD-10-CM | POA: Diagnosis not present

## 2017-05-21 DIAGNOSIS — I509 Heart failure, unspecified: Secondary | ICD-10-CM | POA: Diagnosis not present

## 2017-05-21 DIAGNOSIS — I110 Hypertensive heart disease with heart failure: Secondary | ICD-10-CM | POA: Diagnosis not present

## 2017-05-21 DIAGNOSIS — I872 Venous insufficiency (chronic) (peripheral): Secondary | ICD-10-CM | POA: Diagnosis not present

## 2017-05-25 DIAGNOSIS — I10 Essential (primary) hypertension: Secondary | ICD-10-CM | POA: Diagnosis not present

## 2017-05-25 DIAGNOSIS — I482 Chronic atrial fibrillation: Secondary | ICD-10-CM | POA: Diagnosis not present

## 2017-05-25 DIAGNOSIS — D509 Iron deficiency anemia, unspecified: Secondary | ICD-10-CM | POA: Diagnosis not present

## 2017-05-25 DIAGNOSIS — M19041 Primary osteoarthritis, right hand: Secondary | ICD-10-CM | POA: Diagnosis not present

## 2017-05-25 DIAGNOSIS — F419 Anxiety disorder, unspecified: Secondary | ICD-10-CM | POA: Diagnosis not present

## 2017-05-25 DIAGNOSIS — R600 Localized edema: Secondary | ICD-10-CM | POA: Diagnosis not present

## 2017-05-25 DIAGNOSIS — M19042 Primary osteoarthritis, left hand: Secondary | ICD-10-CM | POA: Diagnosis not present

## 2017-05-25 DIAGNOSIS — I509 Heart failure, unspecified: Secondary | ICD-10-CM | POA: Diagnosis not present

## 2017-05-25 DIAGNOSIS — I4891 Unspecified atrial fibrillation: Secondary | ICD-10-CM | POA: Diagnosis not present

## 2017-05-25 DIAGNOSIS — I87322 Chronic venous hypertension (idiopathic) with inflammation of left lower extremity: Secondary | ICD-10-CM | POA: Diagnosis not present

## 2017-05-25 DIAGNOSIS — S81812D Laceration without foreign body, left lower leg, subsequent encounter: Secondary | ICD-10-CM | POA: Diagnosis not present

## 2017-05-25 DIAGNOSIS — D649 Anemia, unspecified: Secondary | ICD-10-CM | POA: Diagnosis not present

## 2017-05-26 DIAGNOSIS — I87322 Chronic venous hypertension (idiopathic) with inflammation of left lower extremity: Secondary | ICD-10-CM | POA: Diagnosis not present

## 2017-05-26 DIAGNOSIS — M19042 Primary osteoarthritis, left hand: Secondary | ICD-10-CM | POA: Diagnosis not present

## 2017-05-26 DIAGNOSIS — I10 Essential (primary) hypertension: Secondary | ICD-10-CM | POA: Diagnosis not present

## 2017-05-26 DIAGNOSIS — I482 Chronic atrial fibrillation: Secondary | ICD-10-CM | POA: Diagnosis not present

## 2017-05-26 DIAGNOSIS — M19041 Primary osteoarthritis, right hand: Secondary | ICD-10-CM | POA: Diagnosis not present

## 2017-05-26 DIAGNOSIS — S81812D Laceration without foreign body, left lower leg, subsequent encounter: Secondary | ICD-10-CM | POA: Diagnosis not present

## 2017-05-28 DIAGNOSIS — I509 Heart failure, unspecified: Secondary | ICD-10-CM | POA: Diagnosis not present

## 2017-05-28 DIAGNOSIS — I87332 Chronic venous hypertension (idiopathic) with ulcer and inflammation of left lower extremity: Secondary | ICD-10-CM | POA: Diagnosis not present

## 2017-05-28 DIAGNOSIS — I87312 Chronic venous hypertension (idiopathic) with ulcer of left lower extremity: Secondary | ICD-10-CM | POA: Diagnosis not present

## 2017-05-28 DIAGNOSIS — L97822 Non-pressure chronic ulcer of other part of left lower leg with fat layer exposed: Secondary | ICD-10-CM | POA: Diagnosis not present

## 2017-05-28 DIAGNOSIS — I110 Hypertensive heart disease with heart failure: Secondary | ICD-10-CM | POA: Diagnosis not present

## 2017-05-30 DIAGNOSIS — I10 Essential (primary) hypertension: Secondary | ICD-10-CM | POA: Diagnosis not present

## 2017-05-30 DIAGNOSIS — M19041 Primary osteoarthritis, right hand: Secondary | ICD-10-CM | POA: Diagnosis not present

## 2017-05-30 DIAGNOSIS — I87322 Chronic venous hypertension (idiopathic) with inflammation of left lower extremity: Secondary | ICD-10-CM | POA: Diagnosis not present

## 2017-05-30 DIAGNOSIS — S81812D Laceration without foreign body, left lower leg, subsequent encounter: Secondary | ICD-10-CM | POA: Diagnosis not present

## 2017-05-30 DIAGNOSIS — I482 Chronic atrial fibrillation: Secondary | ICD-10-CM | POA: Diagnosis not present

## 2017-05-30 DIAGNOSIS — M19042 Primary osteoarthritis, left hand: Secondary | ICD-10-CM | POA: Diagnosis not present

## 2017-06-01 DIAGNOSIS — I10 Essential (primary) hypertension: Secondary | ICD-10-CM | POA: Diagnosis not present

## 2017-06-01 DIAGNOSIS — I482 Chronic atrial fibrillation: Secondary | ICD-10-CM | POA: Diagnosis not present

## 2017-06-01 DIAGNOSIS — M19041 Primary osteoarthritis, right hand: Secondary | ICD-10-CM | POA: Diagnosis not present

## 2017-06-01 DIAGNOSIS — S81812D Laceration without foreign body, left lower leg, subsequent encounter: Secondary | ICD-10-CM | POA: Diagnosis not present

## 2017-06-01 DIAGNOSIS — I87322 Chronic venous hypertension (idiopathic) with inflammation of left lower extremity: Secondary | ICD-10-CM | POA: Diagnosis not present

## 2017-06-01 DIAGNOSIS — M19042 Primary osteoarthritis, left hand: Secondary | ICD-10-CM | POA: Diagnosis not present

## 2017-06-02 ENCOUNTER — Telehealth: Payer: Self-pay | Admitting: Nurse Practitioner

## 2017-06-02 NOTE — Telephone Encounter (Signed)
Noted. She has had prior issues with swelling/rash from CCB therapy noted back in 07/2015. Unclear as to the status of the Xarelto at this time.

## 2017-06-02 NOTE — Telephone Encounter (Signed)
S/w pt called in today due to Dr.Stoneking's office put pt on bp medication that caused a reaction of red blotches on face.  Pt was changed to diltiazem  (180 mg ) daily and has swelling.  Pt wanted to know if there was a interaction between amiodarone and diltiazem.  Reassured pt this was discussed with [name], Np, who is familiar with pt and know's all pt's medication's and diltiazem can cause swelling.   Pt stated [name] had pt so fined tweaked that last year [name] finally got rid of pt's swelling.  Pt goes to the wound center for gash in leg and pt finally received a skin graft Friday over wound. Stated two nurses at wound center stated thought pt had cellulitis. Legs are warm to touch and shiny.  S/w [name] stated pt can come in next week for appointment but should be contacting Dr.Stoneking's office.  Made pt appointment could not keep it and was hesitant about coming in to state I am going to pick up my antibiotic stated where did you get that from Dr. Felipa Eth office called pt to treat for cellulitis. Pt's hgb is now 11.4.  Will send to [place] to [place].

## 2017-06-02 NOTE — Telephone Encounter (Signed)
Patient calling,states that she has some questions regarding her amiodarone medication.

## 2017-06-04 ENCOUNTER — Encounter (HOSPITAL_BASED_OUTPATIENT_CLINIC_OR_DEPARTMENT_OTHER): Payer: Medicare Other | Attending: Internal Medicine

## 2017-06-04 DIAGNOSIS — L97822 Non-pressure chronic ulcer of other part of left lower leg with fat layer exposed: Secondary | ICD-10-CM | POA: Insufficient documentation

## 2017-06-04 DIAGNOSIS — I87332 Chronic venous hypertension (idiopathic) with ulcer and inflammation of left lower extremity: Secondary | ICD-10-CM | POA: Insufficient documentation

## 2017-06-04 DIAGNOSIS — I509 Heart failure, unspecified: Secondary | ICD-10-CM | POA: Insufficient documentation

## 2017-06-04 DIAGNOSIS — I110 Hypertensive heart disease with heart failure: Secondary | ICD-10-CM | POA: Insufficient documentation

## 2017-06-08 DIAGNOSIS — M19041 Primary osteoarthritis, right hand: Secondary | ICD-10-CM | POA: Diagnosis not present

## 2017-06-08 DIAGNOSIS — I482 Chronic atrial fibrillation: Secondary | ICD-10-CM | POA: Diagnosis not present

## 2017-06-08 DIAGNOSIS — M19042 Primary osteoarthritis, left hand: Secondary | ICD-10-CM | POA: Diagnosis not present

## 2017-06-08 DIAGNOSIS — I87322 Chronic venous hypertension (idiopathic) with inflammation of left lower extremity: Secondary | ICD-10-CM | POA: Diagnosis not present

## 2017-06-08 DIAGNOSIS — I10 Essential (primary) hypertension: Secondary | ICD-10-CM | POA: Diagnosis not present

## 2017-06-08 DIAGNOSIS — S81812D Laceration without foreign body, left lower leg, subsequent encounter: Secondary | ICD-10-CM | POA: Diagnosis not present

## 2017-06-09 ENCOUNTER — Ambulatory Visit: Payer: PRIVATE HEALTH INSURANCE | Admitting: Nurse Practitioner

## 2017-06-09 DIAGNOSIS — H353132 Nonexudative age-related macular degeneration, bilateral, intermediate dry stage: Secondary | ICD-10-CM | POA: Diagnosis not present

## 2017-06-09 DIAGNOSIS — H01002 Unspecified blepharitis right lower eyelid: Secondary | ICD-10-CM | POA: Diagnosis not present

## 2017-06-09 DIAGNOSIS — H04123 Dry eye syndrome of bilateral lacrimal glands: Secondary | ICD-10-CM | POA: Diagnosis not present

## 2017-06-09 DIAGNOSIS — H01001 Unspecified blepharitis right upper eyelid: Secondary | ICD-10-CM | POA: Diagnosis not present

## 2017-06-11 DIAGNOSIS — L928 Other granulomatous disorders of the skin and subcutaneous tissue: Secondary | ICD-10-CM | POA: Diagnosis not present

## 2017-06-11 DIAGNOSIS — L97822 Non-pressure chronic ulcer of other part of left lower leg with fat layer exposed: Secondary | ICD-10-CM | POA: Diagnosis not present

## 2017-06-11 DIAGNOSIS — I87332 Chronic venous hypertension (idiopathic) with ulcer and inflammation of left lower extremity: Secondary | ICD-10-CM | POA: Diagnosis not present

## 2017-06-11 DIAGNOSIS — I509 Heart failure, unspecified: Secondary | ICD-10-CM | POA: Diagnosis not present

## 2017-06-11 DIAGNOSIS — I110 Hypertensive heart disease with heart failure: Secondary | ICD-10-CM | POA: Diagnosis not present

## 2017-06-14 DIAGNOSIS — H903 Sensorineural hearing loss, bilateral: Secondary | ICD-10-CM | POA: Diagnosis not present

## 2017-06-15 DIAGNOSIS — I87322 Chronic venous hypertension (idiopathic) with inflammation of left lower extremity: Secondary | ICD-10-CM | POA: Diagnosis not present

## 2017-06-15 DIAGNOSIS — I482 Chronic atrial fibrillation: Secondary | ICD-10-CM | POA: Diagnosis not present

## 2017-06-15 DIAGNOSIS — M19042 Primary osteoarthritis, left hand: Secondary | ICD-10-CM | POA: Diagnosis not present

## 2017-06-15 DIAGNOSIS — I10 Essential (primary) hypertension: Secondary | ICD-10-CM | POA: Diagnosis not present

## 2017-06-15 DIAGNOSIS — S81812D Laceration without foreign body, left lower leg, subsequent encounter: Secondary | ICD-10-CM | POA: Diagnosis not present

## 2017-06-15 DIAGNOSIS — M19041 Primary osteoarthritis, right hand: Secondary | ICD-10-CM | POA: Diagnosis not present

## 2017-06-18 DIAGNOSIS — L928 Other granulomatous disorders of the skin and subcutaneous tissue: Secondary | ICD-10-CM | POA: Diagnosis not present

## 2017-06-18 DIAGNOSIS — L97822 Non-pressure chronic ulcer of other part of left lower leg with fat layer exposed: Secondary | ICD-10-CM | POA: Diagnosis not present

## 2017-06-18 DIAGNOSIS — I87332 Chronic venous hypertension (idiopathic) with ulcer and inflammation of left lower extremity: Secondary | ICD-10-CM | POA: Diagnosis not present

## 2017-06-18 DIAGNOSIS — I509 Heart failure, unspecified: Secondary | ICD-10-CM | POA: Diagnosis not present

## 2017-06-18 DIAGNOSIS — I110 Hypertensive heart disease with heart failure: Secondary | ICD-10-CM | POA: Diagnosis not present

## 2017-06-21 DIAGNOSIS — I87322 Chronic venous hypertension (idiopathic) with inflammation of left lower extremity: Secondary | ICD-10-CM | POA: Diagnosis not present

## 2017-06-21 DIAGNOSIS — S81812D Laceration without foreign body, left lower leg, subsequent encounter: Secondary | ICD-10-CM | POA: Diagnosis not present

## 2017-06-21 DIAGNOSIS — I10 Essential (primary) hypertension: Secondary | ICD-10-CM | POA: Diagnosis not present

## 2017-06-21 DIAGNOSIS — M19042 Primary osteoarthritis, left hand: Secondary | ICD-10-CM | POA: Diagnosis not present

## 2017-06-21 DIAGNOSIS — M19041 Primary osteoarthritis, right hand: Secondary | ICD-10-CM | POA: Diagnosis not present

## 2017-06-21 DIAGNOSIS — I482 Chronic atrial fibrillation: Secondary | ICD-10-CM | POA: Diagnosis not present

## 2017-06-23 DIAGNOSIS — I10 Essential (primary) hypertension: Secondary | ICD-10-CM | POA: Diagnosis not present

## 2017-06-23 DIAGNOSIS — M19042 Primary osteoarthritis, left hand: Secondary | ICD-10-CM | POA: Diagnosis not present

## 2017-06-23 DIAGNOSIS — I482 Chronic atrial fibrillation: Secondary | ICD-10-CM | POA: Diagnosis not present

## 2017-06-23 DIAGNOSIS — S81812D Laceration without foreign body, left lower leg, subsequent encounter: Secondary | ICD-10-CM | POA: Diagnosis not present

## 2017-06-23 DIAGNOSIS — M19041 Primary osteoarthritis, right hand: Secondary | ICD-10-CM | POA: Diagnosis not present

## 2017-06-23 DIAGNOSIS — I87322 Chronic venous hypertension (idiopathic) with inflammation of left lower extremity: Secondary | ICD-10-CM | POA: Diagnosis not present

## 2017-06-25 DIAGNOSIS — L97822 Non-pressure chronic ulcer of other part of left lower leg with fat layer exposed: Secondary | ICD-10-CM | POA: Diagnosis not present

## 2017-06-25 DIAGNOSIS — I87332 Chronic venous hypertension (idiopathic) with ulcer and inflammation of left lower extremity: Secondary | ICD-10-CM | POA: Diagnosis not present

## 2017-06-25 DIAGNOSIS — I509 Heart failure, unspecified: Secondary | ICD-10-CM | POA: Diagnosis not present

## 2017-06-25 DIAGNOSIS — I110 Hypertensive heart disease with heart failure: Secondary | ICD-10-CM | POA: Diagnosis not present

## 2017-06-25 DIAGNOSIS — S81802A Unspecified open wound, left lower leg, initial encounter: Secondary | ICD-10-CM | POA: Diagnosis not present

## 2017-06-28 DIAGNOSIS — I87322 Chronic venous hypertension (idiopathic) with inflammation of left lower extremity: Secondary | ICD-10-CM | POA: Diagnosis not present

## 2017-06-28 DIAGNOSIS — S81812D Laceration without foreign body, left lower leg, subsequent encounter: Secondary | ICD-10-CM | POA: Diagnosis not present

## 2017-06-28 DIAGNOSIS — I482 Chronic atrial fibrillation: Secondary | ICD-10-CM | POA: Diagnosis not present

## 2017-06-28 DIAGNOSIS — M19042 Primary osteoarthritis, left hand: Secondary | ICD-10-CM | POA: Diagnosis not present

## 2017-06-28 DIAGNOSIS — M19041 Primary osteoarthritis, right hand: Secondary | ICD-10-CM | POA: Diagnosis not present

## 2017-06-28 DIAGNOSIS — I10 Essential (primary) hypertension: Secondary | ICD-10-CM | POA: Diagnosis not present

## 2017-06-29 DIAGNOSIS — Z79899 Other long term (current) drug therapy: Secondary | ICD-10-CM | POA: Diagnosis not present

## 2017-06-29 DIAGNOSIS — D696 Thrombocytopenia, unspecified: Secondary | ICD-10-CM | POA: Diagnosis not present

## 2017-06-30 DIAGNOSIS — I87322 Chronic venous hypertension (idiopathic) with inflammation of left lower extremity: Secondary | ICD-10-CM | POA: Diagnosis not present

## 2017-06-30 DIAGNOSIS — S81812D Laceration without foreign body, left lower leg, subsequent encounter: Secondary | ICD-10-CM | POA: Diagnosis not present

## 2017-06-30 DIAGNOSIS — I10 Essential (primary) hypertension: Secondary | ICD-10-CM | POA: Diagnosis not present

## 2017-06-30 DIAGNOSIS — M19042 Primary osteoarthritis, left hand: Secondary | ICD-10-CM | POA: Diagnosis not present

## 2017-06-30 DIAGNOSIS — M19041 Primary osteoarthritis, right hand: Secondary | ICD-10-CM | POA: Diagnosis not present

## 2017-06-30 DIAGNOSIS — I482 Chronic atrial fibrillation: Secondary | ICD-10-CM | POA: Diagnosis not present

## 2017-07-01 ENCOUNTER — Other Ambulatory Visit: Payer: Self-pay | Admitting: Neurology

## 2017-07-02 DIAGNOSIS — L97822 Non-pressure chronic ulcer of other part of left lower leg with fat layer exposed: Secondary | ICD-10-CM | POA: Diagnosis not present

## 2017-07-02 DIAGNOSIS — I509 Heart failure, unspecified: Secondary | ICD-10-CM | POA: Diagnosis not present

## 2017-07-02 DIAGNOSIS — I872 Venous insufficiency (chronic) (peripheral): Secondary | ICD-10-CM | POA: Diagnosis not present

## 2017-07-02 DIAGNOSIS — I87332 Chronic venous hypertension (idiopathic) with ulcer and inflammation of left lower extremity: Secondary | ICD-10-CM | POA: Diagnosis not present

## 2017-07-02 DIAGNOSIS — I110 Hypertensive heart disease with heart failure: Secondary | ICD-10-CM | POA: Diagnosis not present

## 2017-07-07 ENCOUNTER — Encounter: Payer: Self-pay | Admitting: Podiatry

## 2017-07-07 ENCOUNTER — Ambulatory Visit (INDEPENDENT_AMBULATORY_CARE_PROVIDER_SITE_OTHER): Payer: Medicare Other | Admitting: Podiatry

## 2017-07-07 DIAGNOSIS — M19041 Primary osteoarthritis, right hand: Secondary | ICD-10-CM | POA: Diagnosis not present

## 2017-07-07 DIAGNOSIS — I10 Essential (primary) hypertension: Secondary | ICD-10-CM | POA: Diagnosis not present

## 2017-07-07 DIAGNOSIS — S81812D Laceration without foreign body, left lower leg, subsequent encounter: Secondary | ICD-10-CM | POA: Diagnosis not present

## 2017-07-07 DIAGNOSIS — B351 Tinea unguium: Secondary | ICD-10-CM

## 2017-07-07 DIAGNOSIS — M79674 Pain in right toe(s): Secondary | ICD-10-CM | POA: Diagnosis not present

## 2017-07-07 DIAGNOSIS — I482 Chronic atrial fibrillation: Secondary | ICD-10-CM | POA: Diagnosis not present

## 2017-07-07 DIAGNOSIS — M19042 Primary osteoarthritis, left hand: Secondary | ICD-10-CM | POA: Diagnosis not present

## 2017-07-07 DIAGNOSIS — M79675 Pain in left toe(s): Secondary | ICD-10-CM | POA: Diagnosis not present

## 2017-07-07 DIAGNOSIS — L84 Corns and callosities: Secondary | ICD-10-CM

## 2017-07-07 DIAGNOSIS — I87322 Chronic venous hypertension (idiopathic) with inflammation of left lower extremity: Secondary | ICD-10-CM | POA: Diagnosis not present

## 2017-07-08 NOTE — Progress Notes (Signed)
Subjective:    Patient ID: [name], female   DOB: 83 y.o.   MRN: [id number]   HPI patient presents with painful thick nailbeds 1-5 of both feet that are irritated hard for her to take care of    ROS      Objective:  Physical Exam neurovascular status with thick yellow brittle nailbeds 1-5 both feet that are painful     Assessment:    Mycotic nail infections with pain 1-5 both feet     Plan:    Debris painful nailbeds 1-5 both feet with no iatrogenic bleeding noted

## 2017-07-09 ENCOUNTER — Encounter (HOSPITAL_BASED_OUTPATIENT_CLINIC_OR_DEPARTMENT_OTHER): Payer: Medicare Other | Attending: Internal Medicine

## 2017-07-09 DIAGNOSIS — I509 Heart failure, unspecified: Secondary | ICD-10-CM | POA: Diagnosis not present

## 2017-07-09 DIAGNOSIS — I110 Hypertensive heart disease with heart failure: Secondary | ICD-10-CM | POA: Diagnosis not present

## 2017-07-09 DIAGNOSIS — L97822 Non-pressure chronic ulcer of other part of left lower leg with fat layer exposed: Secondary | ICD-10-CM | POA: Diagnosis not present

## 2017-07-09 DIAGNOSIS — L03116 Cellulitis of left lower limb: Secondary | ICD-10-CM | POA: Diagnosis not present

## 2017-07-09 DIAGNOSIS — I87332 Chronic venous hypertension (idiopathic) with ulcer and inflammation of left lower extremity: Secondary | ICD-10-CM | POA: Insufficient documentation

## 2017-07-09 DIAGNOSIS — S81802A Unspecified open wound, left lower leg, initial encounter: Secondary | ICD-10-CM | POA: Diagnosis not present

## 2017-07-11 DIAGNOSIS — Z23 Encounter for immunization: Secondary | ICD-10-CM | POA: Diagnosis not present

## 2017-07-12 DIAGNOSIS — M19041 Primary osteoarthritis, right hand: Secondary | ICD-10-CM | POA: Diagnosis not present

## 2017-07-12 DIAGNOSIS — S81812D Laceration without foreign body, left lower leg, subsequent encounter: Secondary | ICD-10-CM | POA: Diagnosis not present

## 2017-07-12 DIAGNOSIS — I10 Essential (primary) hypertension: Secondary | ICD-10-CM | POA: Diagnosis not present

## 2017-07-12 DIAGNOSIS — M19042 Primary osteoarthritis, left hand: Secondary | ICD-10-CM | POA: Diagnosis not present

## 2017-07-12 DIAGNOSIS — I87322 Chronic venous hypertension (idiopathic) with inflammation of left lower extremity: Secondary | ICD-10-CM | POA: Diagnosis not present

## 2017-07-12 DIAGNOSIS — I482 Chronic atrial fibrillation: Secondary | ICD-10-CM | POA: Diagnosis not present

## 2017-07-14 DIAGNOSIS — M19042 Primary osteoarthritis, left hand: Secondary | ICD-10-CM | POA: Diagnosis not present

## 2017-07-14 DIAGNOSIS — I10 Essential (primary) hypertension: Secondary | ICD-10-CM | POA: Diagnosis not present

## 2017-07-14 DIAGNOSIS — I87322 Chronic venous hypertension (idiopathic) with inflammation of left lower extremity: Secondary | ICD-10-CM | POA: Diagnosis not present

## 2017-07-14 DIAGNOSIS — M19041 Primary osteoarthritis, right hand: Secondary | ICD-10-CM | POA: Diagnosis not present

## 2017-07-14 DIAGNOSIS — I482 Chronic atrial fibrillation: Secondary | ICD-10-CM | POA: Diagnosis not present

## 2017-07-14 DIAGNOSIS — S81812D Laceration without foreign body, left lower leg, subsequent encounter: Secondary | ICD-10-CM | POA: Diagnosis not present

## 2017-07-16 DIAGNOSIS — I87332 Chronic venous hypertension (idiopathic) with ulcer and inflammation of left lower extremity: Secondary | ICD-10-CM | POA: Diagnosis not present

## 2017-07-16 DIAGNOSIS — I872 Venous insufficiency (chronic) (peripheral): Secondary | ICD-10-CM | POA: Diagnosis not present

## 2017-07-16 DIAGNOSIS — I110 Hypertensive heart disease with heart failure: Secondary | ICD-10-CM | POA: Diagnosis not present

## 2017-07-16 DIAGNOSIS — L97822 Non-pressure chronic ulcer of other part of left lower leg with fat layer exposed: Secondary | ICD-10-CM | POA: Diagnosis not present

## 2017-07-16 DIAGNOSIS — I509 Heart failure, unspecified: Secondary | ICD-10-CM | POA: Diagnosis not present

## 2017-07-20 DIAGNOSIS — M19041 Primary osteoarthritis, right hand: Secondary | ICD-10-CM | POA: Diagnosis not present

## 2017-07-20 DIAGNOSIS — I87322 Chronic venous hypertension (idiopathic) with inflammation of left lower extremity: Secondary | ICD-10-CM | POA: Diagnosis not present

## 2017-07-20 DIAGNOSIS — S81812D Laceration without foreign body, left lower leg, subsequent encounter: Secondary | ICD-10-CM | POA: Diagnosis not present

## 2017-07-20 DIAGNOSIS — I482 Chronic atrial fibrillation: Secondary | ICD-10-CM | POA: Diagnosis not present

## 2017-07-20 DIAGNOSIS — M19042 Primary osteoarthritis, left hand: Secondary | ICD-10-CM | POA: Diagnosis not present

## 2017-07-20 DIAGNOSIS — I10 Essential (primary) hypertension: Secondary | ICD-10-CM | POA: Diagnosis not present

## 2017-07-22 DIAGNOSIS — L97822 Non-pressure chronic ulcer of other part of left lower leg with fat layer exposed: Secondary | ICD-10-CM | POA: Diagnosis not present

## 2017-07-22 DIAGNOSIS — I509 Heart failure, unspecified: Secondary | ICD-10-CM | POA: Diagnosis not present

## 2017-07-22 DIAGNOSIS — I87332 Chronic venous hypertension (idiopathic) with ulcer and inflammation of left lower extremity: Secondary | ICD-10-CM | POA: Diagnosis not present

## 2017-07-22 DIAGNOSIS — I110 Hypertensive heart disease with heart failure: Secondary | ICD-10-CM | POA: Diagnosis not present

## 2017-07-26 DIAGNOSIS — I10 Essential (primary) hypertension: Secondary | ICD-10-CM | POA: Diagnosis not present

## 2017-07-26 DIAGNOSIS — M19042 Primary osteoarthritis, left hand: Secondary | ICD-10-CM | POA: Diagnosis not present

## 2017-07-26 DIAGNOSIS — M19041 Primary osteoarthritis, right hand: Secondary | ICD-10-CM | POA: Diagnosis not present

## 2017-07-26 DIAGNOSIS — I87322 Chronic venous hypertension (idiopathic) with inflammation of left lower extremity: Secondary | ICD-10-CM | POA: Diagnosis not present

## 2017-07-26 DIAGNOSIS — I482 Chronic atrial fibrillation: Secondary | ICD-10-CM | POA: Diagnosis not present

## 2017-07-26 DIAGNOSIS — S81812D Laceration without foreign body, left lower leg, subsequent encounter: Secondary | ICD-10-CM | POA: Diagnosis not present

## 2017-07-28 DIAGNOSIS — M19042 Primary osteoarthritis, left hand: Secondary | ICD-10-CM | POA: Diagnosis not present

## 2017-07-28 DIAGNOSIS — I482 Chronic atrial fibrillation: Secondary | ICD-10-CM | POA: Diagnosis not present

## 2017-07-28 DIAGNOSIS — I87322 Chronic venous hypertension (idiopathic) with inflammation of left lower extremity: Secondary | ICD-10-CM | POA: Diagnosis not present

## 2017-07-28 DIAGNOSIS — I10 Essential (primary) hypertension: Secondary | ICD-10-CM | POA: Diagnosis not present

## 2017-07-28 DIAGNOSIS — M19041 Primary osteoarthritis, right hand: Secondary | ICD-10-CM | POA: Diagnosis not present

## 2017-07-28 DIAGNOSIS — S81812D Laceration without foreign body, left lower leg, subsequent encounter: Secondary | ICD-10-CM | POA: Diagnosis not present

## 2017-07-29 IMAGING — CR DG CHEST 2V
2 series · 2 of 2 positions shown · non-contrast
Comparison: 09/17/2011

CLINICAL DATA: Cough shortness of breath for 3 weeks history of
COPD

EXAM:
CHEST  2 VIEW

[w chest pa]
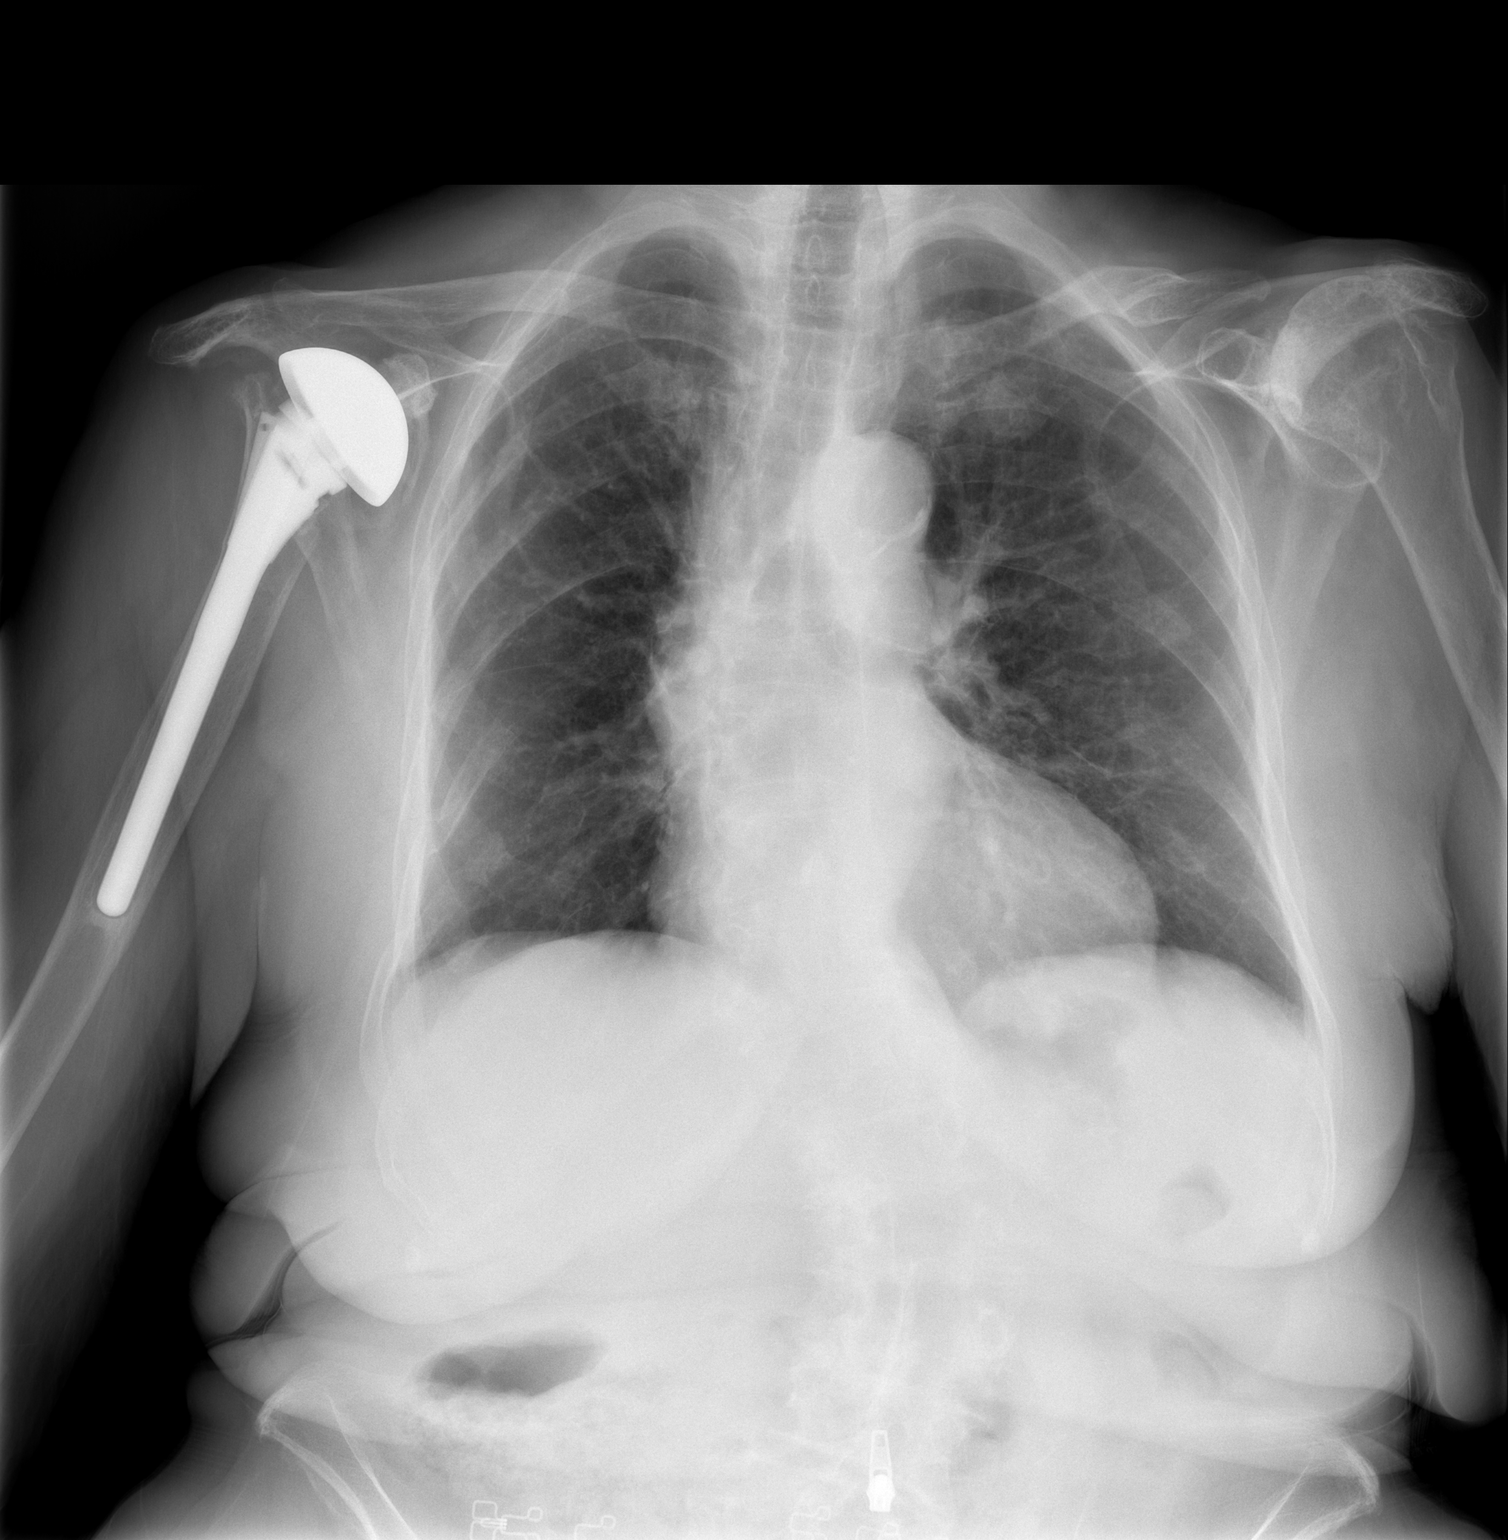

[w chest lat]
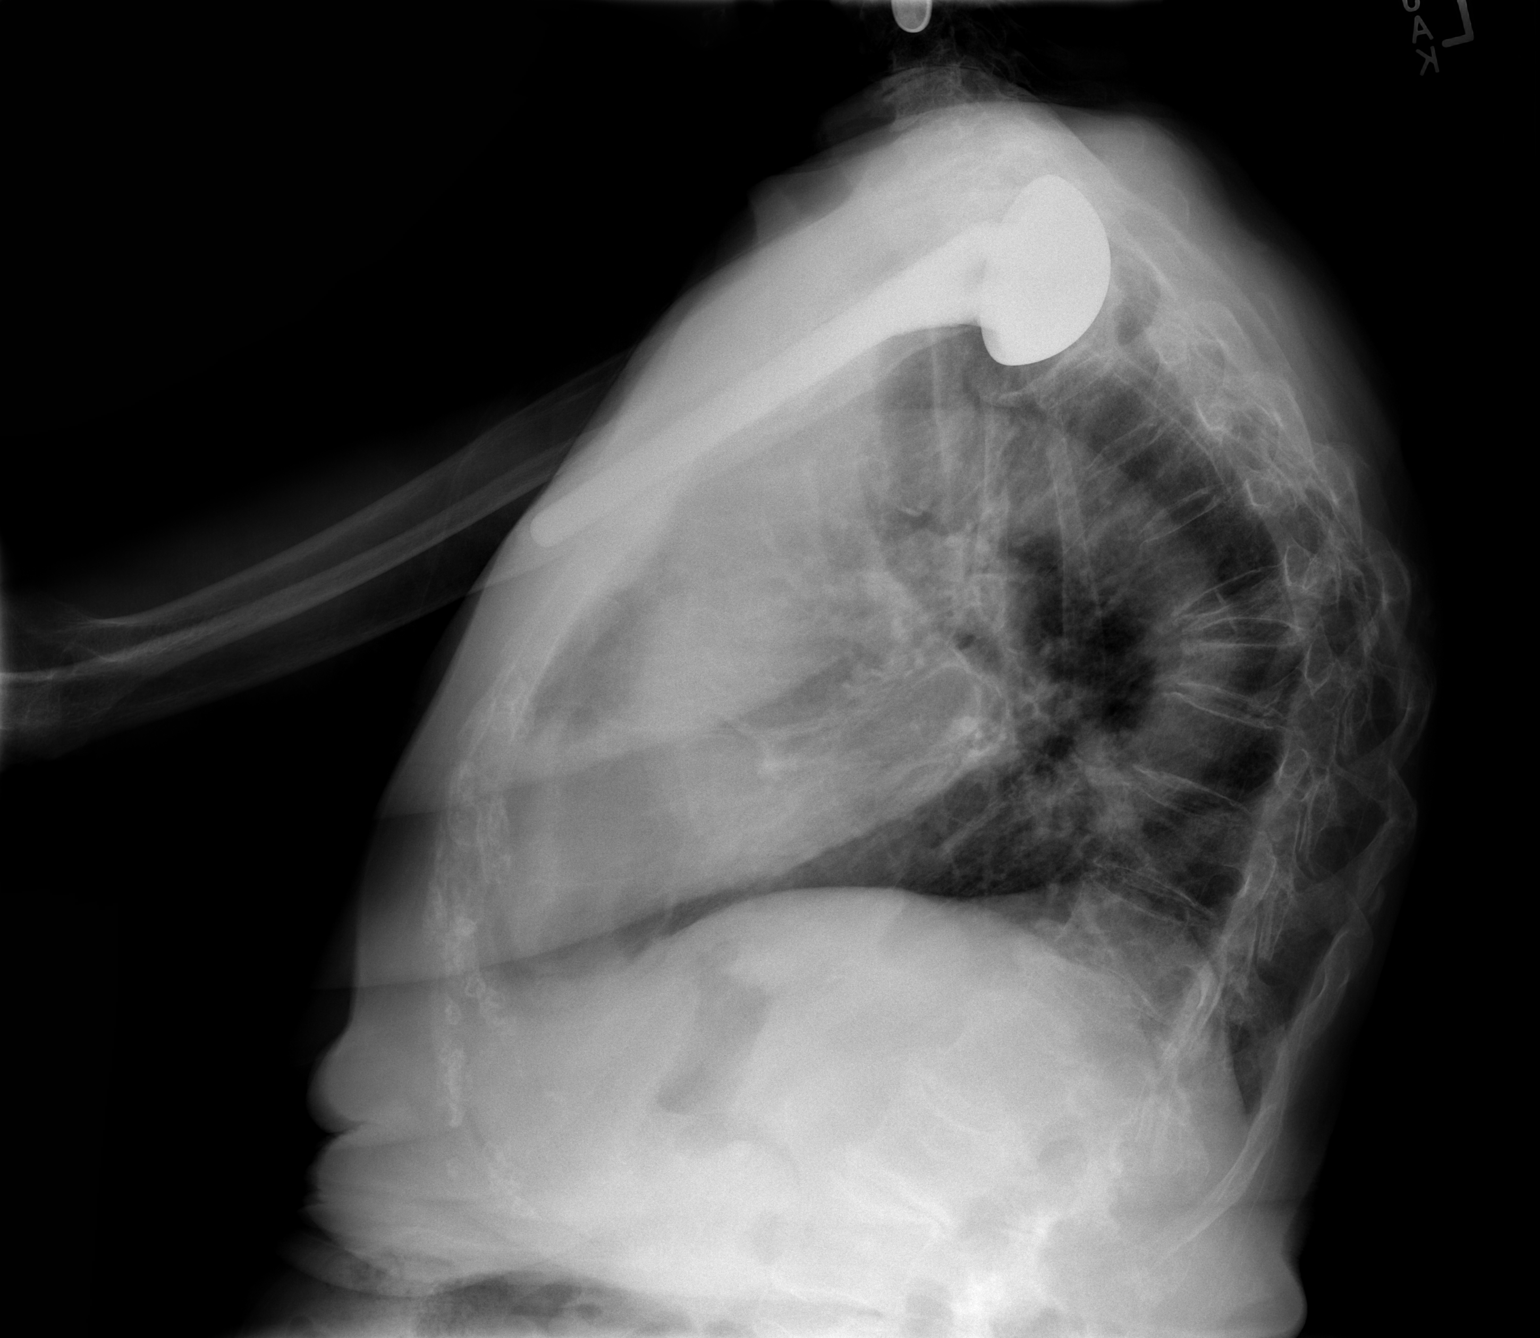

[2 of 2 positions shown; findings below may reference images not displayed]

FINDINGS: Heart size and vascular pattern are normal. No consolidation or
effusion. Right shoulder replacement. Evidence chronic rotator cuff
tear on the left.

Numerous thoracic and thoracolumbar compression deformities new from
1451.
IMPRESSION: No acute cardiopulmonary process. Numerous spinal compression
deformities.

## 2017-07-30 DIAGNOSIS — I110 Hypertensive heart disease with heart failure: Secondary | ICD-10-CM | POA: Diagnosis not present

## 2017-07-30 DIAGNOSIS — I87332 Chronic venous hypertension (idiopathic) with ulcer and inflammation of left lower extremity: Secondary | ICD-10-CM | POA: Diagnosis not present

## 2017-07-30 DIAGNOSIS — I509 Heart failure, unspecified: Secondary | ICD-10-CM | POA: Diagnosis not present

## 2017-07-30 DIAGNOSIS — L97829 Non-pressure chronic ulcer of other part of left lower leg with unspecified severity: Secondary | ICD-10-CM | POA: Diagnosis not present

## 2017-07-30 DIAGNOSIS — L97822 Non-pressure chronic ulcer of other part of left lower leg with fat layer exposed: Secondary | ICD-10-CM | POA: Diagnosis not present

## 2017-08-10 DIAGNOSIS — I129 Hypertensive chronic kidney disease with stage 1 through stage 4 chronic kidney disease, or unspecified chronic kidney disease: Secondary | ICD-10-CM | POA: Diagnosis not present

## 2017-08-10 DIAGNOSIS — L0292 Furuncle, unspecified: Secondary | ICD-10-CM | POA: Diagnosis not present

## 2017-08-10 DIAGNOSIS — N183 Chronic kidney disease, stage 3 (moderate): Secondary | ICD-10-CM | POA: Diagnosis not present

## 2017-08-10 DIAGNOSIS — Z1389 Encounter for screening for other disorder: Secondary | ICD-10-CM | POA: Diagnosis not present

## 2017-08-10 DIAGNOSIS — M810 Age-related osteoporosis without current pathological fracture: Secondary | ICD-10-CM | POA: Diagnosis not present

## 2017-08-10 DIAGNOSIS — I4891 Unspecified atrial fibrillation: Secondary | ICD-10-CM | POA: Diagnosis not present

## 2017-08-10 DIAGNOSIS — Z1331 Encounter for screening for depression: Secondary | ICD-10-CM | POA: Diagnosis not present

## 2017-08-17 DIAGNOSIS — L0292 Furuncle, unspecified: Secondary | ICD-10-CM | POA: Diagnosis not present

## 2017-08-17 DIAGNOSIS — G8929 Other chronic pain: Secondary | ICD-10-CM | POA: Diagnosis not present

## 2017-08-17 DIAGNOSIS — M25512 Pain in left shoulder: Secondary | ICD-10-CM | POA: Diagnosis not present

## 2017-08-21 ENCOUNTER — Other Ambulatory Visit: Payer: Self-pay | Admitting: Neurology

## 2017-08-24 DIAGNOSIS — L89891 Pressure ulcer of other site, stage 1: Secondary | ICD-10-CM | POA: Diagnosis not present

## 2017-08-24 DIAGNOSIS — I129 Hypertensive chronic kidney disease with stage 1 through stage 4 chronic kidney disease, or unspecified chronic kidney disease: Secondary | ICD-10-CM | POA: Diagnosis not present

## 2017-08-24 DIAGNOSIS — N183 Chronic kidney disease, stage 3 (moderate): Secondary | ICD-10-CM | POA: Diagnosis not present

## 2017-08-24 DIAGNOSIS — L0292 Furuncle, unspecified: Secondary | ICD-10-CM | POA: Diagnosis not present

## 2017-08-25 ENCOUNTER — Other Ambulatory Visit: Payer: Self-pay | Admitting: Neurology

## 2017-08-26 NOTE — Telephone Encounter (Signed)
Faxed printed/signed rx Belsomra to [employer] at [phone]. Received fax confirmation.

## 2017-08-27 DIAGNOSIS — M19011 Primary osteoarthritis, right shoulder: Secondary | ICD-10-CM | POA: Diagnosis not present

## 2017-08-27 DIAGNOSIS — Z471 Aftercare following joint replacement surgery: Secondary | ICD-10-CM | POA: Diagnosis not present

## 2017-08-27 DIAGNOSIS — Z96611 Presence of right artificial shoulder joint: Secondary | ICD-10-CM | POA: Diagnosis not present

## 2017-08-27 DIAGNOSIS — M19012 Primary osteoarthritis, left shoulder: Secondary | ICD-10-CM | POA: Diagnosis not present

## 2017-09-01 ENCOUNTER — Ambulatory Visit (INDEPENDENT_AMBULATORY_CARE_PROVIDER_SITE_OTHER): Payer: Medicare Other | Admitting: Neurology

## 2017-09-01 ENCOUNTER — Encounter: Payer: Self-pay | Admitting: Neurology

## 2017-09-01 VITALS — BP 132/48 | HR 41 | Ht 59.0 in | Wt 118.0 lb

## 2017-09-01 DIAGNOSIS — R413 Other amnesia: Secondary | ICD-10-CM | POA: Diagnosis not present

## 2017-09-01 DIAGNOSIS — G2581 Restless legs syndrome: Secondary | ICD-10-CM | POA: Diagnosis not present

## 2017-09-01 MED ORDER — ESZOPICLONE 3 MG PO TABS: 3.0000 mg | ORAL_TABLET | Freq: Every day | ORAL | 1 refills | Status: DC

## 2017-09-01 MED ORDER — RIVASTIGMINE TARTRATE 4.5 MG PO CAPS: 4.5000 mg | ORAL_CAPSULE | Freq: Two times a day (BID) | ORAL | 1 refills | Status: DC

## 2017-09-01 MED ORDER — APOAEQUORIN 10 MG PO CAPS: 1.0000 | ORAL_CAPSULE | Freq: Every day | ORAL | 1 refills | Status: DC

## 2017-09-01 NOTE — Progress Notes (Signed)
Reason for visit: Memory disturbance  [name] is an 83 y.o. female  History of present illness:  [name] is an 83 year old right-handed white female with a history of a mild memory disturbance.  The patient continues to live alone in her own home, she still operates a motor vehicle.  She mainly is concerned about chronic insomnia.  She had been doing fairly well on Belsomra, but this medication is no longer effective for her.  The patient may take an occasional Ativan for sleep if needed.  The patient does not believe there has been any significant change in her memory since last seen.  She is on Exelon capsules taking 3 mg twice daily.  She is tolerating the drug well.  She does have gait instability, she uses a cane for ambulation, she has not reported any recent falls.  She returns for an evaluation.  Past Medical History:  Diagnosis Date  . Arthritis    severe  . Atrial fibrillation ([place])   . Chronic diastolic CHF (congestive heart failure) ([place])   . Chronic insomnia   . Degenerative arthritis   . Essential hypertension   . Hypercholesteremia   . Memory deficit 05/24/2013  . Osteoporosis   . PAF (paroxysmal atrial fibrillation) ([place])    a. Intolerant to amiodarone, discontinued October 2012.  Marland Kitchen RLS (restless legs syndrome) 08/11/2016  . Sinus bradycardia   . Stroke [facility]) 2007   mini stroke or TIA with aphasia     Past Surgical History:  Procedure Laterality Date  . CATARACT EXTRACTION  2009   ou  . FEMUR FRACTURE SURGERY  8/12  . left knee  2001  . REPLACEMENT TOTAL KNEE BILATERAL Right 1999  . right intertrochanteric hip fracture status post ORIF  August 2012  . right shoulder replacement  2009    Family History  Problem Relation Age of Onset  . Dementia Mother   . Stroke Mother   . Cancer - Colon Father   . Cancer Father   . Cancer Sister   . Cancer Son   . Cancer Sister   . Coronary artery disease Unknown     Social history:  reports that she has  never smoked. She has never used smokeless tobacco. She reports that she drinks about 4.2 oz of alcohol per week . She reports that she does not use drugs.    Allergies  Allergen Reactions  . Codeine Nausea Only    Medications:  Prior to Admission medications   Medication Sig Start Date End Date Taking? Authorizing Provider  alclomethasone (ACLOVATE) 0.05 % cream Apply 1 application topically as needed (rash/redness).  07/09/16  Yes [provider]  amiodarone (PACERONE) 100 MG tablet TAKE 1 TABLET(100 MG) BY MOUTH EVERY DAY 03/10/17  Yes Burtis Junes, NP  BELSOMRA 20 MG TABS TAKE 1 TABLET BY MOUTH AT BEDTIME AS NEEDED 08/26/17  Yes Kathrynn Ducking, MD  Cholecalciferol (VITAMIN D-3 PO) Take 1 capsule by mouth daily.   Yes [provider]  Cyanocobalamin (VITAMIN B-12 PO) Take 1 tablet by mouth daily.   Yes [provider]  gabapentin (NEURONTIN) 300 MG capsule Take 300 mg by mouth once as needed.   Yes [provider]  Melatonin 3 MG TABS Take 10 mg by mouth at bedtime as needed (for sleep).    Yes [provider]  RESTASIS 0.05 % ophthalmic emulsion Place 1 drop into both eyes 2 (two) times daily. 03/24/16  Yes [provider]  Rivaroxaban (XARELTO) 15 MG TABS tablet Take 1 tablet (15 mg total) by mouth daily with supper. 04/21/17  Yes Burtis Junes, NP  rivastigmine (EXELON) 3 MG capsule Take 1 capsule (3 mg total) by mouth 2 (two) times daily. 02/09/17  Yes Dennie Bible, NP  spironolactone (ALDACTONE) 25 MG tablet TK 1 T PO QD IN THE MORNING WF 06/15/17  Yes [provider]  traMADol (ULTRAM) 50 MG tablet Take 50 mg by mouth 2 (two) times daily as needed (for pain). Reported on 01/22/2016 12/01/11  Yes [provider]  VOLTAREN 1 % GEL Apply 1 application topically daily as needed (for leg pain).  11/09/14  Yes [provider]  furosemide (LASIX) 40 MG tablet Take 1 tablet (40 mg total) by mouth daily.  May take an extra 20 mg prn weight gain of 2 pounds overnight. 11/26/16 02/24/17  Burtis Junes, NP  nitroGLYCERIN (NITROSTAT) 0.4 MG SL tablet Place 1 tablet (0.4 mg total) under the tongue every 5 (five) minutes as needed for chest pain. 08/27/16 11/25/16  Burtis Junes, NP    ROS:  Out of a complete 14 system review of symptoms, the patient complains only of the following symptoms, and all other reviewed systems are negative.  Fatigue Eye redness, blurred vision Flushing Bruising easily  Blood pressure (!) 132/48, pulse (!) 41, height 4\' 11"  (1.499 m), weight 118 lb (53.5 kg).  Physical Exam  General: The patient is alert and cooperative at the time of the examination.   Neuromuscular: The patient has difficulty with abduction of the left shoulder and arm, there is some pain with elevation of the arm.  Skin: No significant peripheral edema is noted.   Neurologic Exam  Mental status: The patient is alert and oriented x 3 at the time of the examination. The patient has apparent normal recent and remote memory, with an apparently normal attention span and concentration ability.  Mini-Mental status examination done today shows a total score of 30/30.   Cranial nerves: Facial symmetry is present. Speech is normal, no aphasia or dysarthria is noted. Extraocular movements are full. Visual fields are full.  Motor: The patient has good strength in all 4 extremities.  Sensory examination: Soft touch sensation is symmetric on the face, arms, and legs.  Coordination: The patient has good finger-nose-finger and heel-to-shin bilaterally.  Gait and station: The patient has a wide-based gait, the patient is able to walk with a cane.  Tandem gait was not attempted.  Romberg is negative but is unsteady.  Reflexes: Deep tendon reflexes are symmetric.   Assessment/Plan:  1.  Mild memory disturbance  2.  Chronic insomnia  3.  Restless leg syndrome  4.  Gait disorder  The patient  wishes to give a trial on Prevagen, she may take 10 mg daily.  We will go up on the Exelon capsules taking 4.5 mg twice daily, a small prescription was sent in for her to try this increased dose.  The patient will stop the Belsomra, she will try Lunesta 3 mg at night for sleep if needed.  She will follow-up in 6 months.  Jill Alexanders MD 09/01/2017 2:46 PM  [facility] [address] [place], [place] [address]  Phone [phone] Fax [phone]

## 2017-10-06 ENCOUNTER — Other Ambulatory Visit: Payer: Self-pay | Admitting: Nurse Practitioner

## 2017-10-13 ENCOUNTER — Ambulatory Visit (INDEPENDENT_AMBULATORY_CARE_PROVIDER_SITE_OTHER): Payer: Medicare Other | Admitting: Nurse Practitioner

## 2017-10-13 ENCOUNTER — Telehealth: Payer: Self-pay | Admitting: *Deleted

## 2017-10-13 ENCOUNTER — Encounter: Payer: Self-pay | Admitting: Nurse Practitioner

## 2017-10-13 VITALS — BP 130/50 | HR 62 | Ht 59.0 in | Wt 122.8 lb

## 2017-10-13 DIAGNOSIS — Z79899 Other long term (current) drug therapy: Secondary | ICD-10-CM

## 2017-10-13 DIAGNOSIS — R001 Bradycardia, unspecified: Secondary | ICD-10-CM

## 2017-10-13 NOTE — Progress Notes (Signed)
CARDIOLOGY OFFICE NOTE  Date:  10/13/2017    [name] Date of Birth: 1928/03/03 Medical Record #[id number]  PCP:  Lajean Manes, MD  Cardiologist:  Atilano Median   Chief Complaint  Patient presents with  . Atrial Fibrillation    6 month check - seen for Dr. Lovena Le    History of Present Illness: [name] is a 83 y.o. female who presents today for a follow up visit. Seen for Dr. Lovena Le. Former patient of Dr. Susa Simmonds. I previously took care of her husband [name] for many years.   She has a history of paroxysmal atrial fibrillation, prior stroke, dyslipidemia, hypertension, and diastolic heart failure. The patient has rheumatoid arthritis. Her atrial arrhythmias have beenwell-controlled. She was previously on chronic anticoagulation - stopped in 2018 due to anemia.   Last seen by Dr. Lovena Le back in May of 2016 - felt to be doing ok from our standpoint.  I saw her back September of 2016 - lots more swelling in her legs - worse after being switched over to Verapamil. Good EF by echo.   She was hospitalized back in July of 2017 with acute on chronic diastolic HF. HR initially elevated but improved with metoprolol. She was in atrial fib - looks to me like this was probably the trigger for her demise. Looks like itwas assumed she hadchronic AF - however she was in NSR on EKG last year. Now on Xarelto for her anticoagulation. Metoprolol dose was increased along with her Lasix.   I saw her back for follow up - I felt like her AF was the probable trigger for recent exacerbation. Discussed with Dr. Lovena Le and we elected to place her on amiodarone therapy - low dose. She has done ok - we have been able to cut her beta blocker back due to bradycardia.   I saw her back in November of 2017 after endorsing more chest pain. We opted to continue with medical management. Metoprolol was stopped for bradycardia.   Last seen back in June - was doing ok but dealing with a wound on her  lower left leg - ended up finding out that she was anemic - stopped her anticoagulation and got her back to see Dr. Felipa Eth.   Comes in today. Here alone. She notes she "had a near death experience" before coming here today. She got choked on water - this made her pretty breathless. This has happened a few times. She feels like this is because of "excessive" nasal drainage. Her leg finally healed up. She is back on Xarelto. No active bleeding noted. No real reason as to why she got anemic. She is tolerating her medicines. No falls. Still pretty independent.   Past Medical History:  Diagnosis Date  . Arthritis    severe  . Atrial fibrillation ([place])   . Chronic diastolic CHF (congestive heart failure) ([place])   . Chronic insomnia   . Degenerative arthritis   . Essential hypertension   . Hypercholesteremia   . Memory deficit 05/24/2013  . Osteoporosis   . PAF (paroxysmal atrial fibrillation) ([place])    a. Intolerant to amiodarone, discontinued October 2012.  Marland Kitchen RLS (restless legs syndrome) 08/11/2016  . Sinus bradycardia   . Stroke [facility]) 2007   mini stroke or TIA with aphasia     Past Surgical History:  Procedure Laterality Date  . CATARACT EXTRACTION  2009   ou  . FEMUR FRACTURE SURGERY  8/12  . left knee  2001  . REPLACEMENT TOTAL KNEE BILATERAL Right 1999  . right intertrochanteric hip fracture status post ORIF  August 2012  . right shoulder replacement  2009     Medications: Current Meds  Medication Sig  . alclomethasone (ACLOVATE) 0.05 % cream Apply 1 application topically as needed (rash/redness).   Marland Kitchen amiodarone (PACERONE) 100 MG tablet TAKE 1 TABLET(100 MG) BY MOUTH EVERY DAY  . Cholecalciferol (VITAMIN D-3 PO) Take 1 capsule by mouth daily.  . Cyanocobalamin (VITAMIN B-12 PO) Take 1 tablet by mouth daily.  . Eszopiclone (ESZOPICLONE) 3 MG TABS Take 1 tablet (3 mg total) by mouth at bedtime. Take immediately before bedtime  . gabapentin (NEURONTIN) 300 MG capsule Take 300  mg by mouth once as needed.  . Melatonin 3 MG TABS Take 10 mg by mouth at bedtime as needed (for sleep).   . RESTASIS 0.05 % ophthalmic emulsion Place 1 drop into both eyes 2 (two) times daily.  . Rivaroxaban (XARELTO) 15 MG TABS tablet Take 1 tablet (15 mg total) by mouth daily with supper.  . rivastigmine (EXELON) 4.5 MG capsule Take 1 capsule (4.5 mg total) by mouth 2 (two) times daily.  Marland Kitchen spironolactone (ALDACTONE) 25 MG tablet TK 1 T PO QD IN THE MORNING WF  . traMADol (ULTRAM) 50 MG tablet Take 50 mg by mouth 2 (two) times daily as needed (for pain). Reported on 01/22/2016  . VOLTAREN 1 % GEL Apply 1 application topically daily as needed (for leg pain).      Allergies: Allergies  Allergen Reactions  . Codeine Nausea Only    Social History: The patient  reports that  has never smoked. she has never used smokeless tobacco. She reports that she drinks about 4.2 oz of alcohol per week. She reports that she does not use drugs.   Family History: The patient's family history includes Cancer in her father, sister, sister, and son; Cancer - Colon in her father; Coronary artery disease in her unknown relative; Dementia in her mother; Stroke in her mother.   Review of Systems: Please see the history of present illness.   Otherwise, the review of systems is positive for none.   All other systems are reviewed and negative.   Physical Exam: VS:  BP (!) 130/50 (BP Location: Left Arm, Patient Position: Sitting, Cuff Size: Normal)   Pulse 62   Ht 4\' 11"  (1.499 m)   Wt 122 lb 12.8 oz (55.7 kg)   BMI 24.80 kg/m  .  BMI Body mass index is 24.8 kg/m.  Wt Readings from Last 3 Encounters:  10/13/17 122 lb 12.8 oz (55.7 kg)  09/01/17 118 lb (53.5 kg)  04/21/17 122 lb 6.4 oz (55.5 kg)    General: Pleasant. Elderly female. Alert and in no acute distress.  She is in a wheelchair.  HEENT: Normal.  Neck: Supple, no JVD, carotid bruits, or masses noted.  Cardiac: Regular rate and rhythm. Heart  tones are distant. No edema.  Respiratory:  Lungs are clear to auscultation bilaterally with normal work of breathing.  GI: Soft and nontender.  MS: No deformity or atrophy. Gait not tested.  Skin: Warm and dry. Color is normal.  Neuro:  Strength and sensation are intact and no gross focal deficits noted.  Psych: Alert, appropriate and with normal affect.   LABORATORY DATA:  EKG:  EKG is ordered today. This demonstrates NSR with PACs.  Lab Results  Component Value Date   WBC 6.0 04/21/2017   HGB 8.6 (  L) 04/21/2017   HCT 27.7 (L) 04/21/2017   PLT 181 04/21/2017   GLUCOSE 115 (H) 04/21/2017   ALT 13 04/21/2017   AST 21 04/21/2017   NA 137 04/21/2017   K 4.9 04/21/2017   CL 96 04/21/2017   CREATININE 0.98 04/21/2017   BUN 26 04/21/2017   CO2 22 04/21/2017   TSH 1.570 04/21/2017   INR 1.26 05/26/2016     BNP (last 3 results) No results for input(s): BNP in the last 8760 hours.  ProBNP (last 3 results) No results for input(s): PROBNP in the last 8760 hours.   Other Studies Reviewed Today:  Echo Study Conclusions from 03/2015  - Left ventricle: The cavity size was normal. Wall thickness was increased in a pattern of mild LVH. Systolic function was normal. The estimated ejection fraction was in the range of 55% to 60%. Doppler parameters are consistent with abnormal left ventricular relaxation (grade 1 diastolic dysfunction). - Aortic valve: There was mild regurgitation. - Mitral valve: Calcified annulus. Mildly thickened leaflets . There was mild regurgitation. - Atrial septum: No defect or patent foramen ovale was identified. - Pulmonary arteries: PA peak pressure: 35 mm Hg (S).   Lower Extremity Duplex Summary: No evidence of deep vein thrombosis involving the right common femoral vein and left lower extremity.  Other specific details can be found in the table(s) above. Prepared and Electronically Authenticated by  Gae Gallop  MD 2016-09-02T14:05:48   Assessment/Plan:  1. Complains of hair loss -not discussed today.   2. Chest pain - currently not an issue - would favor continued conservative management.  3. Chronic diastolic HF - with pastexacerbation thatI suspected wasdue to recurrent AF with RVR. EKG today shows she remains in NSR. Only on amiodarone back to just 100 mg a day andno longer on her Metoprolol.   4. PAF - back in NSR by exam and by EKG today. HR has improved. Needs surveillance labs today.   5. Chronic anticoagulation - no problems noted. She is back on Xarelto. Needs labs today.   6. CKD   7. Advanced age. She still does surprisingly well.   8. Nasal drainage - ok to use Claritin or Flonase. I suspect her choking was related to excessive nasal drainage. Encouraged her to use her lifeline to get help when needed.   Current medicines are reviewed with the patient today.  The patient does not have concerns regarding medicines other than what has been noted above.  The following changes have been made:  See above.  Labs/ tests ordered today include:    Orders Placed This Encounter  Procedures  . Basic metabolic panel  . CBC  . Hepatic function panel  . TSH  . EKG 12-Lead     Disposition:   FU with me in 6 months.   Patient is agreeable to this plan and will call if any problems develop in the interim.   SignedTruitt Merle, NP  10/13/2017 4:34 PM  [facility] [address] [place], [place]  [address] Phone: [phone] Fax: [phone]

## 2017-10-13 NOTE — Telephone Encounter (Signed)
S/w pt is coming in today has a ride with senior wheels.

## 2017-10-13 NOTE — Patient Instructions (Signed)
We will be checking the following labs today - BMET, CBC, HPF, TSH   Medication Instructions:    Continue with your current medicines.   Ok to use plain Zyrtec or Claritin.   Ok to use [facility] or [facility]    Testing/Procedures To Be Arranged:  N/A  Follow-Up:   See me in 6 months    Other Special Instructions:   N/A    If you need a refill on your cardiac medications before your next appointment, please call your pharmacy.   Call the [place] office at [phone] if you have any questions, problems or concerns.

## 2017-10-14 ENCOUNTER — Telehealth: Payer: Self-pay

## 2017-10-14 LAB — HEPATIC FUNCTION PANEL
ALT: 15 IU/L (ref 0–32)
AST: 24 IU/L (ref 0–40)
Albumin: 4 g/dL (ref 3.5–4.7)
Alkaline Phosphatase: 58 IU/L (ref 39–117)
Bilirubin Total: 0.2 mg/dL (ref 0.0–1.2)
Bilirubin, Direct: 0.1 mg/dL (ref 0.00–0.40)
Total Protein: 6.7 g/dL (ref 6.0–8.5)

## 2017-10-14 LAB — BASIC METABOLIC PANEL
BUN/Creatinine Ratio: 34 — ABNORMAL HIGH (ref 12–28)
BUN: 33 mg/dL — ABNORMAL HIGH (ref 8–27)
CO2: 28 mmol/L (ref 20–29)
Calcium: 9.9 mg/dL (ref 8.7–10.3)
Chloride: 95 mmol/L — ABNORMAL LOW (ref 96–106)
Creatinine, Ser: 0.96 mg/dL (ref 0.57–1.00)
GFR calc Af Amer: 61 mL/min/{1.73_m2} (ref >59)
GFR calc non Af Amer: 53 mL/min/{1.73_m2} — ABNORMAL LOW (ref >59)
Glucose: 86 mg/dL (ref 65–99)
Potassium: 6 mmol/L — ABNORMAL HIGH (ref 3.5–5.2)
Sodium: 137 mmol/L (ref 134–144)

## 2017-10-14 LAB — CBC
Hematocrit: 35.8 % (ref 34.0–46.6)
Hemoglobin: 12.2 g/dL (ref 11.1–15.9)
MCH: 31.6 pg (ref 26.6–33.0)
MCHC: 34.1 g/dL (ref 31.5–35.7)
MCV: 93 fL (ref 79–97)
Platelets: 168 10*3/uL (ref 150–379)
RBC: 3.86 x10E6/uL (ref 3.77–5.28)
RDW: 15.2 % (ref 12.3–15.4)
WBC: 5.9 10*3/uL (ref 3.4–10.8)

## 2017-10-14 LAB — TSH: TSH: 0.784 u[IU]/mL (ref 0.450–4.500)

## 2017-10-14 NOTE — Telephone Encounter (Signed)
Patient aware of lab results. Patient refusing to come in for lab work in one week. See lab result note.

## 2017-10-14 NOTE — Telephone Encounter (Signed)
-----   Message from Burtis Junes, NP sent at 10/14/2017  7:38 AM EST ----- Please call - her potassium is too high - most likely due to being on spironolactone - she needs to stop taking this medicine. Low potassium diet as well.  BMET in one week.  Copy of lab to Dr. Felipa Eth please.

## 2017-10-15 ENCOUNTER — Telehealth: Payer: Self-pay | Admitting: *Deleted

## 2017-10-15 NOTE — Telephone Encounter (Signed)
S/w pt is aware [place] from Dr. Carlyle Lipa office will be calling pt to schedule repeat bmet to have someone come out to pt's house due to pt does not have a means for transportation. Pt was not happy about not eating spinach salad and banana's everyday.  Explain the importance of keeping potassium in range.  Pt stated 6.0 was not that bad.  Went over potassium enriched foods and will send to pt today a list of foods to watch.  Pt was aware and grateful for call.

## 2017-10-18 ENCOUNTER — Other Ambulatory Visit: Payer: Self-pay | Admitting: Neurology

## 2017-10-18 ENCOUNTER — Telehealth: Payer: Self-pay | Admitting: *Deleted

## 2017-10-18 NOTE — Telephone Encounter (Signed)
Received PA request from [facility] Dr. For rx eszopiclone.  Called [employer] and spoke with [place]. She will fax PA form to be completed at [phone].  Ref# for call: [phone]

## 2017-10-19 NOTE — Telephone Encounter (Signed)
Faxed completed/signed PA back to [employer] at [phone]. Received confirmation. Waiting on determination. Dx: Insomnia ICD-10: G47.00 Has tried/failed- Belsomra (ineffective)

## 2017-10-20 NOTE — Telephone Encounter (Signed)
Received notice of approval for PA eszopiclone from [place]. Approved effective from 07/22/17-10/20/18.  Faxed notice of approval to [facility] Dr at [phone]. Received fax confirmation.

## 2017-10-20 NOTE — Telephone Encounter (Signed)
Received notification that pt does not have pharmacy benefits from [employer]. I called pharmacy. Received insurance info: BIN: [id number], PCN: [id number], GROUP: [id number] Patient ID#: [id number]  Submitted PA on covermymeds. [id number].  "Your information has been submitted to [place] Medicare Part D. [employer] Part D will review the request and will issue a decision, typically within 1-3 days from your submission. You can check the updated outcome later by reopening this request.  If [employer] has not responded in 1-3 days or if you have any questions about your ePA request, please contact [place] Medicare Part D at [phone]. If you think there may be a problem with your PA request, use our live chat feature at the bottom right."  Waiting on determination.

## 2017-10-23 ENCOUNTER — Other Ambulatory Visit: Payer: Self-pay | Admitting: Neurology

## 2017-10-27 NOTE — Telephone Encounter (Signed)
Called and LVM for pt asking for a call back. I wanted to know how she was doing on the increased dose of rivastigmine before sending refill in.

## 2017-10-28 ENCOUNTER — Telehealth: Payer: Self-pay | Admitting: Neurology

## 2017-10-28 NOTE — Telephone Encounter (Signed)
Reviewed chart - [name] had called her on 10/27/17 to make sure she was tolerating the higher dose of Exelon 4.5mg  BID.  She is doing well on the medication.  Refill sent to pharmacy.  She is aware and pick up the prescription today.

## 2017-10-28 NOTE — Telephone Encounter (Signed)
Pt said she is returning a call reg refill for rivastigmine (EXELON) 4.5 MG capsule. I don't see any prior notes reg this. Pt said she out and needs this asap, she is wanting it sent to [facility] this morning as she is wanting to pick it up prior to the rain coming in today.  Pt said at last OV she asked for 1 yr supply and was told by the provider that would be ok and she doesn't understand why this was not done. She does not want a call back, just her medication refill sent to the pharmacy

## 2017-10-28 NOTE — Telephone Encounter (Signed)
Spoke to patient - she is doing well on the higher Exelon dose.  Prescription has been sent to the pharmacy.

## 2017-11-19 DIAGNOSIS — L239 Allergic contact dermatitis, unspecified cause: Secondary | ICD-10-CM | POA: Diagnosis not present

## 2017-11-23 ENCOUNTER — Telehealth: Payer: Self-pay | Admitting: Neurology

## 2017-11-23 NOTE — Telephone Encounter (Signed)
I called the patient.  The patient has developed a rash on the face and neck.  She has been to a dermatologist, the source of the rash is not clear.  She was asking if the Johnnye Sima may be the source of this, this is not typical for a drug rash, but I suppose anything is possible.  The patient has stopped the medication which seems reasonable.  In the future, she may need to get back on Belsomra for sleep.

## 2017-11-23 NOTE — Telephone Encounter (Signed)
Patient calling to discuss side effects of Eszopiclone (ESZOPICLONE) 3 MG TABS. Could this drug cause severe facial skin blotches and facial skin irritations? Please call and discuss.

## 2017-11-30 ENCOUNTER — Other Ambulatory Visit: Payer: Self-pay | Admitting: Neurology

## 2017-12-02 ENCOUNTER — Other Ambulatory Visit: Payer: Self-pay | Admitting: Neurology

## 2017-12-02 MED ORDER — ESZOPICLONE 3 MG PO TABS: 3.0000 mg | ORAL_TABLET | Freq: Every day | ORAL | 3 refills | Status: DC

## 2017-12-02 NOTE — Telephone Encounter (Signed)
Pt is wanting a refill for Lunesta sent to [facility], pt didn't want to stop taking it.

## 2017-12-02 NOTE — Addendum Note (Signed)
Addended by: Kathrynn Ducking on: 12/02/2017 01:29 PM   Modules accepted: Orders

## 2017-12-02 NOTE — Telephone Encounter (Signed)
I called the patient. She still wants to continue the [place]. She indicates that the skin rash has cleared up.

## 2017-12-20 ENCOUNTER — Telehealth: Payer: Self-pay | Admitting: Internal Medicine

## 2017-12-20 NOTE — Telephone Encounter (Signed)
New message    Pt c/o medication issue:  1. Name of Medication:  furosemide (LASIX) 40 MG tablet(Expired) Take 1 tablet (40 mg total) by mouth daily. May take an extra 20 mg prn weight gain of 2 pounds overnight.     2. How are you currently taking this medication (dosage and times per day)? 40mg   3. Are you having a reaction (difficulty breathing--STAT)? no  4. What is your medication issue? Is there a pill that is 20 mg ? So that she can take 20mg  in am and 20 mg in afternoon, she can not go anywhere when she takes the 40mg 

## 2017-12-20 NOTE — Telephone Encounter (Signed)
[name],  Will you call her- this is fine by me.   Has she had her potassium rechecked since coming off Aldactone???

## 2017-12-21 ENCOUNTER — Telehealth: Payer: Self-pay | Admitting: *Deleted

## 2017-12-21 MED ORDER — FUROSEMIDE 40 MG PO TABS: 20.0000 mg | ORAL_TABLET | Freq: Two times a day (BID) | ORAL | 9 refills | Status: DC

## 2017-12-21 NOTE — Telephone Encounter (Signed)
Noted. Will let her get her lab with Dr Felipa Eth - I will see back as planned.

## 2017-12-21 NOTE — Telephone Encounter (Signed)
S/w pt stated is not taking aldactone.  Pt's medication list is updated, pt is taking lasix one half tablet (20 mg) bid. Pt stated does have a Dr. Felipa Eth appt coming up soon, could not state date, is getting blood work during that visit. Discussed with pt hasn't had blood work at [facility] in a while.  Pt stated Twice a year has a checkup at [facility] and pt does get blood work at that visit. Will send to [place] to [place].

## 2017-12-21 NOTE — Telephone Encounter (Signed)
lvm to call back

## 2017-12-21 NOTE — Telephone Encounter (Signed)
Pt had potassium 12/12 it was 6.0.

## 2018-01-03 DIAGNOSIS — M810 Age-related osteoporosis without current pathological fracture: Secondary | ICD-10-CM | POA: Diagnosis not present

## 2018-01-03 DIAGNOSIS — Z79899 Other long term (current) drug therapy: Secondary | ICD-10-CM | POA: Diagnosis not present

## 2018-01-16 DIAGNOSIS — S51811A Laceration without foreign body of right forearm, initial encounter: Secondary | ICD-10-CM | POA: Diagnosis not present

## 2018-01-16 DIAGNOSIS — M79601 Pain in right arm: Secondary | ICD-10-CM | POA: Diagnosis not present

## 2018-01-21 DIAGNOSIS — H353122 Nonexudative age-related macular degeneration, left eye, intermediate dry stage: Secondary | ICD-10-CM | POA: Diagnosis not present

## 2018-01-21 DIAGNOSIS — H04123 Dry eye syndrome of bilateral lacrimal glands: Secondary | ICD-10-CM | POA: Diagnosis not present

## 2018-01-22 ENCOUNTER — Other Ambulatory Visit: Payer: Self-pay | Admitting: Nurse Practitioner

## 2018-01-24 NOTE — Telephone Encounter (Addendum)
Xarelto 15mg  refill request received; pt is 84 yrs old, wt-55.7kg, Crea-0.96 on 10/13/17, last seen by [name] on 10/13/17, CrCl-36.61ml/min; Will send in refill to requested pharmacy.

## 2018-01-28 ENCOUNTER — Encounter (HOSPITAL_BASED_OUTPATIENT_CLINIC_OR_DEPARTMENT_OTHER): Payer: Medicare Other | Attending: Internal Medicine

## 2018-01-28 DIAGNOSIS — Z8673 Personal history of transient ischemic attack (TIA), and cerebral infarction without residual deficits: Secondary | ICD-10-CM | POA: Diagnosis not present

## 2018-01-28 DIAGNOSIS — L97821 Non-pressure chronic ulcer of other part of left lower leg limited to breakdown of skin: Secondary | ICD-10-CM | POA: Diagnosis not present

## 2018-01-28 DIAGNOSIS — Z96653 Presence of artificial knee joint, bilateral: Secondary | ICD-10-CM | POA: Insufficient documentation

## 2018-01-28 DIAGNOSIS — I87332 Chronic venous hypertension (idiopathic) with ulcer and inflammation of left lower extremity: Secondary | ICD-10-CM | POA: Diagnosis not present

## 2018-01-28 DIAGNOSIS — I87312 Chronic venous hypertension (idiopathic) with ulcer of left lower extremity: Secondary | ICD-10-CM | POA: Diagnosis not present

## 2018-01-28 DIAGNOSIS — L97812 Non-pressure chronic ulcer of other part of right lower leg with fat layer exposed: Secondary | ICD-10-CM | POA: Diagnosis not present

## 2018-01-28 DIAGNOSIS — Z96611 Presence of right artificial shoulder joint: Secondary | ICD-10-CM | POA: Diagnosis not present

## 2018-02-07 ENCOUNTER — Encounter (HOSPITAL_BASED_OUTPATIENT_CLINIC_OR_DEPARTMENT_OTHER): Payer: Medicare Other | Attending: Internal Medicine

## 2018-02-07 DIAGNOSIS — L97829 Non-pressure chronic ulcer of other part of left lower leg with unspecified severity: Secondary | ICD-10-CM | POA: Insufficient documentation

## 2018-02-07 DIAGNOSIS — Z96653 Presence of artificial knee joint, bilateral: Secondary | ICD-10-CM | POA: Insufficient documentation

## 2018-02-07 DIAGNOSIS — L97212 Non-pressure chronic ulcer of right calf with fat layer exposed: Secondary | ICD-10-CM | POA: Insufficient documentation

## 2018-02-07 DIAGNOSIS — I110 Hypertensive heart disease with heart failure: Secondary | ICD-10-CM | POA: Insufficient documentation

## 2018-02-07 DIAGNOSIS — Z8673 Personal history of transient ischemic attack (TIA), and cerebral infarction without residual deficits: Secondary | ICD-10-CM | POA: Diagnosis not present

## 2018-02-07 DIAGNOSIS — I509 Heart failure, unspecified: Secondary | ICD-10-CM | POA: Diagnosis not present

## 2018-02-07 DIAGNOSIS — I87332 Chronic venous hypertension (idiopathic) with ulcer and inflammation of left lower extremity: Secondary | ICD-10-CM | POA: Diagnosis not present

## 2018-02-07 DIAGNOSIS — I87312 Chronic venous hypertension (idiopathic) with ulcer of left lower extremity: Secondary | ICD-10-CM | POA: Diagnosis not present

## 2018-02-07 DIAGNOSIS — L97822 Non-pressure chronic ulcer of other part of left lower leg with fat layer exposed: Secondary | ICD-10-CM | POA: Diagnosis not present

## 2018-02-09 DIAGNOSIS — I482 Chronic atrial fibrillation: Secondary | ICD-10-CM | POA: Diagnosis not present

## 2018-02-09 DIAGNOSIS — Z9181 History of falling: Secondary | ICD-10-CM | POA: Diagnosis not present

## 2018-02-09 DIAGNOSIS — I87322 Chronic venous hypertension (idiopathic) with inflammation of left lower extremity: Secondary | ICD-10-CM | POA: Diagnosis not present

## 2018-02-09 DIAGNOSIS — Z7901 Long term (current) use of anticoagulants: Secondary | ICD-10-CM | POA: Diagnosis not present

## 2018-02-09 DIAGNOSIS — L97821 Non-pressure chronic ulcer of other part of left lower leg limited to breakdown of skin: Secondary | ICD-10-CM | POA: Diagnosis not present

## 2018-02-10 ENCOUNTER — Encounter (HOSPITAL_COMMUNITY): Payer: Self-pay | Admitting: *Deleted

## 2018-02-10 ENCOUNTER — Emergency Department (HOSPITAL_COMMUNITY)
Admission: EM | Admit: 2018-02-10 | Discharge: 2018-02-10 | Disposition: A | Discharge status: Home / Self Care | Payer: Medicare Other | Attending: Emergency Medicine | Admitting: Emergency Medicine

## 2018-02-10 ENCOUNTER — Other Ambulatory Visit: Payer: Self-pay

## 2018-02-10 DIAGNOSIS — Y999 Unspecified external cause status: Secondary | ICD-10-CM | POA: Insufficient documentation

## 2018-02-10 DIAGNOSIS — M79606 Pain in leg, unspecified: Secondary | ICD-10-CM | POA: Diagnosis not present

## 2018-02-10 DIAGNOSIS — S81811A Laceration without foreign body, right lower leg, initial encounter: Secondary | ICD-10-CM | POA: Diagnosis not present

## 2018-02-10 DIAGNOSIS — Y929 Unspecified place or not applicable: Secondary | ICD-10-CM | POA: Insufficient documentation

## 2018-02-10 DIAGNOSIS — I10 Essential (primary) hypertension: Secondary | ICD-10-CM | POA: Diagnosis not present

## 2018-02-10 DIAGNOSIS — W208XXA Other cause of strike by thrown, projected or falling object, initial encounter: Secondary | ICD-10-CM | POA: Insufficient documentation

## 2018-02-10 DIAGNOSIS — I5032 Chronic diastolic (congestive) heart failure: Secondary | ICD-10-CM | POA: Insufficient documentation

## 2018-02-10 DIAGNOSIS — I4891 Unspecified atrial fibrillation: Secondary | ICD-10-CM | POA: Insufficient documentation

## 2018-02-10 DIAGNOSIS — Z79899 Other long term (current) drug therapy: Secondary | ICD-10-CM | POA: Diagnosis not present

## 2018-02-10 DIAGNOSIS — Y939 Activity, unspecified: Secondary | ICD-10-CM | POA: Insufficient documentation

## 2018-02-10 DIAGNOSIS — Z7901 Long term (current) use of anticoagulants: Secondary | ICD-10-CM | POA: Diagnosis not present

## 2018-02-10 DIAGNOSIS — T148XXA Other injury of unspecified body region, initial encounter: Secondary | ICD-10-CM | POA: Diagnosis not present

## 2018-02-10 MED ORDER — BENZOIN EX LIQD: Freq: Once | CUTANEOUS | Status: DC

## 2018-02-10 MED ORDER — LIDOCAINE HCL 2 % IJ SOLN
20.0000 mL | Freq: Once | INTRAMUSCULAR | Status: AC
  Administered 2018-02-10: 400 mg via INTRADERMAL
  Filled 2018-02-10: qty 20

## 2018-02-10 MED ORDER — BACITRACIN ZINC 500 UNIT/GM EX OINT
TOPICAL_OINTMENT | CUTANEOUS | Status: AC
  Administered 2018-02-10: 1
  Filled 2018-02-10: qty 0.9

## 2018-02-10 NOTE — ED Notes (Signed)
ED Provider at bedside. 

## 2018-02-10 NOTE — ED Triage Notes (Signed)
Pt arrives via EMS. She was closing the car door and caught her right lower leg in the door. Right anterior lower leg laceration, bleeding controlled. Pt is on xarelto.

## 2018-02-10 NOTE — Discharge Instructions (Signed)
Follow up with your regular doctor for recheck in a week.   Look out for any signs of infection like fever, redness or swelling. Return to the ER if any signs of infection.

## 2018-02-10 NOTE — ED Provider Notes (Signed)
[facility] DEPT Provider Note   CSN: [id number] Arrival date & time: 02/10/18  2129     History   Chief Complaint Chief Complaint  Patient presents with  . Laceration    HPI [name] is a 84 y.o. female.  HPI   Ms. [name] is an 84yo female with a history of atrial fibrillation (on Xarelto), hypertension, memory deficit who presents to the emergency department for evaluation of right leg laceration.  Patient reports that the corner of the car door hit her leg as she was trying to close it at about 8 PM tonight.  She reports that immediately started bleeding, but was controlled with pressure.  Reports that she has a stinging sensation over the laceration.  Has not taken any over-the-counter medications for her pain.  She states that she does not want stitches, as she had a wound in the past that was "not supposed to be stitched" and had a subsequent infection. Denies numbness, weakness, fever or chills.  Reports that she has had a tetanus shot in the past 5 years.  Is able to ambulate independently without difficulty.  Past Medical History:  Diagnosis Date  . Arthritis    severe  . Atrial fibrillation ([place])   . Chronic diastolic CHF (congestive heart failure) ([place])   . Chronic insomnia   . Degenerative arthritis   . Essential hypertension   . Hypercholesteremia   . Memory deficit 05/24/2013  . Osteoporosis   . PAF (paroxysmal atrial fibrillation) ([place])    a. Intolerant to amiodarone, discontinued October 2012.  Marland Kitchen RLS (restless legs syndrome) 08/11/2016  . Sinus bradycardia   . Stroke [facility]) 2007   mini stroke or TIA with aphasia     Patient Active Problem List   Diagnosis Date Noted  . RLS (restless legs syndrome) 08/11/2016  . Demand ischemia ([place]) 05/27/2016  . Acute on chronic diastolic congestive heart failure ([place]) 05/26/2016  . Atrial fibrillation with RVR ([place]) 05/26/2016  . Insomnia 01/22/2016  . Sinus bradycardia   . Chronic  diastolic CHF (congestive heart failure) ([place])   . Essential hypertension   . PAF (paroxysmal atrial fibrillation) ([place])   . Stroke ([place])   . Memory deficit 05/24/2013  . Arthritis 10/13/2011  . Long term (current) use of anticoagulants 07/21/2011  . Dyslipidemia 07/10/2011    Past Surgical History:  Procedure Laterality Date  . CATARACT EXTRACTION  2009   ou  . FEMUR FRACTURE SURGERY  8/12  . left knee  2001  . REPLACEMENT TOTAL KNEE BILATERAL Right 1999  . right intertrochanteric hip fracture status post ORIF  August 2012  . right shoulder replacement  2009     OB History   None      Home Medications    Prior to Admission medications   Medication Sig Start Date End Date Taking? Authorizing Provider  alclomethasone (ACLOVATE) 0.05 % cream Apply 1 application topically as needed (rash/redness).  07/09/16   [provider]  amiodarone (PACERONE) 100 MG tablet TAKE 1 TABLET(100 MG) BY MOUTH EVERY DAY 10/06/17   Burtis Junes, NP  Cholecalciferol (VITAMIN D-3 PO) Take 1 capsule by mouth daily.    [provider]  Cyanocobalamin (VITAMIN B-12 PO) Take 1 tablet by mouth daily.    [provider]  Eszopiclone (ESZOPICLONE) 3 MG TABS Take 1 tablet (3 mg total) by mouth at bedtime. Take immediately before bedtime 12/02/17   Kathrynn Ducking, MD  furosemide (LASIX) 40  MG tablet Take 0.5 tablets (20 mg total) by mouth 2 (two) times daily. May take an extra 20 mg prn weight gain of 2 pounds overnight. 12/21/17 03/21/18  Burtis Junes, NP  gabapentin (NEURONTIN) 300 MG capsule Take 300 mg by mouth once as needed.    [provider]  Melatonin 3 MG TABS Take 10 mg by mouth at bedtime as needed (for sleep).     [provider]  nitroGLYCERIN (NITROSTAT) 0.4 MG SL tablet Place 1 tablet (0.4 mg total) under the tongue every 5 (five) minutes as needed for chest pain. 08/27/16 11/25/16  Burtis Junes, NP  RESTASIS 0.05 % ophthalmic emulsion Place  1 drop into both eyes 2 (two) times daily. 03/24/16   [provider]  rivastigmine (EXELON) 4.5 MG capsule TAKE 1 CAPSULE(4.5 MG) BY MOUTH TWICE DAILY 10/28/17   Kathrynn Ducking, MD  traMADol (ULTRAM) 50 MG tablet Take 50 mg by mouth 2 (two) times daily as needed (for pain). Reported on 01/22/2016 12/01/11   [provider]  VOLTAREN 1 % GEL Apply 1 application topically daily as needed (for leg pain).  11/09/14   [provider]  XARELTO 15 MG TABS tablet TAKE 1 TABLET(15 MG) BY MOUTH DAILY WITH SUPPER 01/25/18   Evans Lance, MD    Family History Family History  Problem Relation Age of Onset  . Dementia Mother   . Stroke Mother   . Cancer - Colon Father   . Cancer Father   . Cancer Sister   . Cancer Son   . Cancer Sister   . Coronary artery disease Unknown     Social History Social History   Tobacco Use  . Smoking status: Never Smoker  . Smokeless tobacco: Never Used  Substance Use Topics  . Alcohol use: Yes    Alcohol/week: 4.2 oz    Types: 7 Glasses of wine per week    Comment: 1 glass wine with dinner some days  . Drug use: No     Allergies   Codeine   Review of Systems Review of Systems  Constitutional: Negative for chills and fever.  Eyes: Negative for visual disturbance.  Respiratory: Negative for shortness of breath.   Cardiovascular: Negative for chest pain.  Musculoskeletal: Negative for gait problem and joint swelling.  Skin: Positive for wound (laceration over right anterior shin).  Neurological: Negative for weakness, numbness and headaches.     Physical Exam Updated Vital Signs BP (!) 162/81   Pulse 69   Temp 98.3 F (36.8 C) (Oral)   Resp 20   Ht 4\' 11"  (1.499 m)   Wt 55.3 kg (122 lb)   SpO2 98%   BMI 24.64 kg/m   Physical Exam  Constitutional: She appears well-developed and well-nourished. No distress.  HENT:  Head: Normocephalic and atraumatic.  Eyes: Right eye exhibits no discharge. Left eye exhibits no  discharge.  Pulmonary/Chest: Effort normal. No respiratory distress.  Musculoskeletal:  7 cm vertical laceration over the right anterior mid shin.  It is approximately 5 mm deep.  No surrounding erythema, warmth or induration.  No palpable foreign body.  Full ROM of knee and ankle without pain. DP pulses 2+ bilaterally.  Distal sensation to light touch intact beyond laceration.  Neurological: She is alert. Coordination normal.  Gait normal in coordination and balance.   Skin: Skin is warm and dry. Capillary refill takes less than 2 seconds. She is not diaphoretic.  Psychiatric: She has a normal  mood and affect. Her behavior is normal.  Nursing note and vitals reviewed.    ED Treatments / Results  Labs (all labs ordered are listed, but only abnormal results are displayed) Labs Reviewed - No data to display  EKG None  Radiology No results found.  Procedures .Marland KitchenLaceration Repair Date/Time: 02/10/2018 11:08 PM Performed by: Glyn Ade, PA-C Authorized by: Glyn Ade, PA-C   Consent:    Consent obtained:  Verbal and emergent situation   Consent given by:  Patient   Risks discussed:  Infection, pain, poor cosmetic result, poor wound healing and retained foreign body   Alternatives discussed:  No treatment Anesthesia (see MAR for exact dosages):    Anesthesia method:  Local infiltration   Local anesthetic:  Lidocaine 2% w/o epi Laceration details:    Location:  Leg   Leg location:  R lower leg   Length (cm):  7   Depth (mm):  5 Exploration:    Hemostasis achieved with:  Direct pressure   Wound exploration: wound explored through full range of motion and entire depth of wound probed and visualized     Contaminated: no   Treatment:    Area cleansed with:  Betadine   Amount of cleaning:  Extensive   Irrigation solution:  Sterile water   Irrigation volume:  1L   Irrigation method:  Pressure wash Skin repair:    Repair method:  Steri-Strips   Number of  Steri-Strips:  10 Approximation:    Approximation:  Close Post-procedure details:    Dressing:  Antibiotic ointment and non-adherent dressing   Patient tolerance of procedure:  Tolerated well, no immediate complications   (including critical care time)  Medications Ordered in ED Medications  lidocaine (XYLOCAINE) 2 % (with pres) injection 400 mg (has no administration in time range)  bacitracin 500 UNIT/GM ointment (has no administration in time range)     Initial Impression / Assessment and Plan / ED Course  I have reviewed the triage vital signs and the nursing notes.  Pertinent labs & imaging results that were available during my care of the patient were reviewed by me and considered in my medical decision making (see chart for details).     Patient on Xarelto presents with laceration.  No erythema, warmth or signs of infection.  Her blood pressure is elevated on arrival 179/84. Patient denies headache, visual disturbance, chest pain, shortness of breath. Do not suspect hypertensive urgency given presentation. This improved to 162/81 while in the ED.   Recommended suture to close the wound. Patient does not want this given concern that she had this done in the past and had subsequent infection. She is requesting steri strips and antibiotic ointment. I think this is also appropriate, counseled her that it may take longer to fully close and she agrees.   Pressure irrigation performed. Wound explored and base of wound visualized in a bloodless field without evidence of foreign body.  Laceration occurred < 8 hours prior to repair which was well tolerated. Patient reports tetanus up todate. Discussed home care with patient and answered questions. Pt to follow-up for wound check in 7 days; she is to return to the ED sooner for signs of infection. Patient agrees and voices understanding to this plan. Pt is hemodynamically stable with no complaints prior to dc. This was a shared visit with Dr.  Tomi Bamberger who also saw the patient and agrees with plan and discharge home.   Final Clinical Impressions(s) / ED Diagnoses  Final diagnoses:  Laceration of right lower extremity, initial encounter    ED Discharge Orders    None       Bernarda Caffey 02/10/18 2314    Dorie Rank, MD 02/10/18 2324

## 2018-02-10 NOTE — ED Notes (Signed)
Bed: WA08 Expected date:  Expected time:  Means of arrival:  Comments: 84 yo F/right leg injury

## 2018-02-12 DIAGNOSIS — L97821 Non-pressure chronic ulcer of other part of left lower leg limited to breakdown of skin: Secondary | ICD-10-CM | POA: Diagnosis not present

## 2018-02-12 DIAGNOSIS — Z9181 History of falling: Secondary | ICD-10-CM | POA: Diagnosis not present

## 2018-02-12 DIAGNOSIS — I482 Chronic atrial fibrillation: Secondary | ICD-10-CM | POA: Diagnosis not present

## 2018-02-12 DIAGNOSIS — I87322 Chronic venous hypertension (idiopathic) with inflammation of left lower extremity: Secondary | ICD-10-CM | POA: Diagnosis not present

## 2018-02-12 DIAGNOSIS — Z7901 Long term (current) use of anticoagulants: Secondary | ICD-10-CM | POA: Diagnosis not present

## 2018-02-14 DIAGNOSIS — I87322 Chronic venous hypertension (idiopathic) with inflammation of left lower extremity: Secondary | ICD-10-CM | POA: Diagnosis not present

## 2018-02-14 DIAGNOSIS — L97821 Non-pressure chronic ulcer of other part of left lower leg limited to breakdown of skin: Secondary | ICD-10-CM | POA: Diagnosis not present

## 2018-02-14 DIAGNOSIS — I482 Chronic atrial fibrillation: Secondary | ICD-10-CM | POA: Diagnosis not present

## 2018-02-14 DIAGNOSIS — Z9181 History of falling: Secondary | ICD-10-CM | POA: Diagnosis not present

## 2018-02-14 DIAGNOSIS — Z7901 Long term (current) use of anticoagulants: Secondary | ICD-10-CM | POA: Diagnosis not present

## 2018-02-15 DIAGNOSIS — Z0000 Encounter for general adult medical examination without abnormal findings: Secondary | ICD-10-CM | POA: Diagnosis not present

## 2018-02-15 DIAGNOSIS — N183 Chronic kidney disease, stage 3 (moderate): Secondary | ICD-10-CM | POA: Diagnosis not present

## 2018-02-15 DIAGNOSIS — M810 Age-related osteoporosis without current pathological fracture: Secondary | ICD-10-CM | POA: Diagnosis not present

## 2018-02-15 DIAGNOSIS — S81811A Laceration without foreign body, right lower leg, initial encounter: Secondary | ICD-10-CM | POA: Diagnosis not present

## 2018-02-15 DIAGNOSIS — I129 Hypertensive chronic kidney disease with stage 1 through stage 4 chronic kidney disease, or unspecified chronic kidney disease: Secondary | ICD-10-CM | POA: Diagnosis not present

## 2018-02-15 DIAGNOSIS — I4891 Unspecified atrial fibrillation: Secondary | ICD-10-CM | POA: Diagnosis not present

## 2018-02-15 DIAGNOSIS — Z1389 Encounter for screening for other disorder: Secondary | ICD-10-CM | POA: Diagnosis not present

## 2018-02-16 DIAGNOSIS — I87332 Chronic venous hypertension (idiopathic) with ulcer and inflammation of left lower extremity: Secondary | ICD-10-CM | POA: Diagnosis not present

## 2018-02-16 DIAGNOSIS — L97212 Non-pressure chronic ulcer of right calf with fat layer exposed: Secondary | ICD-10-CM | POA: Diagnosis not present

## 2018-02-16 DIAGNOSIS — I509 Heart failure, unspecified: Secondary | ICD-10-CM | POA: Diagnosis not present

## 2018-02-16 DIAGNOSIS — L97829 Non-pressure chronic ulcer of other part of left lower leg with unspecified severity: Secondary | ICD-10-CM | POA: Diagnosis not present

## 2018-02-16 DIAGNOSIS — I110 Hypertensive heart disease with heart failure: Secondary | ICD-10-CM | POA: Diagnosis not present

## 2018-02-16 DIAGNOSIS — Z96653 Presence of artificial knee joint, bilateral: Secondary | ICD-10-CM | POA: Diagnosis not present

## 2018-02-16 DIAGNOSIS — S81801A Unspecified open wound, right lower leg, initial encounter: Secondary | ICD-10-CM | POA: Diagnosis not present

## 2018-02-18 DIAGNOSIS — Z9181 History of falling: Secondary | ICD-10-CM | POA: Diagnosis not present

## 2018-02-18 DIAGNOSIS — L97821 Non-pressure chronic ulcer of other part of left lower leg limited to breakdown of skin: Secondary | ICD-10-CM | POA: Diagnosis not present

## 2018-02-18 DIAGNOSIS — I482 Chronic atrial fibrillation: Secondary | ICD-10-CM | POA: Diagnosis not present

## 2018-02-18 DIAGNOSIS — Z7901 Long term (current) use of anticoagulants: Secondary | ICD-10-CM | POA: Diagnosis not present

## 2018-02-18 DIAGNOSIS — I87322 Chronic venous hypertension (idiopathic) with inflammation of left lower extremity: Secondary | ICD-10-CM | POA: Diagnosis not present

## 2018-02-21 DIAGNOSIS — I482 Chronic atrial fibrillation: Secondary | ICD-10-CM | POA: Diagnosis not present

## 2018-02-21 DIAGNOSIS — Z9181 History of falling: Secondary | ICD-10-CM | POA: Diagnosis not present

## 2018-02-21 DIAGNOSIS — I87322 Chronic venous hypertension (idiopathic) with inflammation of left lower extremity: Secondary | ICD-10-CM | POA: Diagnosis not present

## 2018-02-21 DIAGNOSIS — L97821 Non-pressure chronic ulcer of other part of left lower leg limited to breakdown of skin: Secondary | ICD-10-CM | POA: Diagnosis not present

## 2018-02-21 DIAGNOSIS — Z7901 Long term (current) use of anticoagulants: Secondary | ICD-10-CM | POA: Diagnosis not present

## 2018-02-23 DIAGNOSIS — L97812 Non-pressure chronic ulcer of other part of right lower leg with fat layer exposed: Secondary | ICD-10-CM | POA: Diagnosis not present

## 2018-02-23 DIAGNOSIS — I87332 Chronic venous hypertension (idiopathic) with ulcer and inflammation of left lower extremity: Secondary | ICD-10-CM | POA: Diagnosis not present

## 2018-02-23 DIAGNOSIS — Z96653 Presence of artificial knee joint, bilateral: Secondary | ICD-10-CM | POA: Diagnosis not present

## 2018-02-23 DIAGNOSIS — I110 Hypertensive heart disease with heart failure: Secondary | ICD-10-CM | POA: Diagnosis not present

## 2018-02-23 DIAGNOSIS — L97829 Non-pressure chronic ulcer of other part of left lower leg with unspecified severity: Secondary | ICD-10-CM | POA: Diagnosis not present

## 2018-02-23 DIAGNOSIS — L97212 Non-pressure chronic ulcer of right calf with fat layer exposed: Secondary | ICD-10-CM | POA: Diagnosis not present

## 2018-02-23 DIAGNOSIS — I509 Heart failure, unspecified: Secondary | ICD-10-CM | POA: Diagnosis not present

## 2018-02-24 ENCOUNTER — Other Ambulatory Visit (HOSPITAL_COMMUNITY)
Admission: RE | Admit: 2018-02-24 | Discharge: 2018-02-24 | Disposition: A | Discharge status: Home / Self Care | Payer: Medicare Other | Source: Other Acute Inpatient Hospital | Attending: Physician Assistant | Admitting: Physician Assistant

## 2018-02-24 DIAGNOSIS — L97222 Non-pressure chronic ulcer of left calf with fat layer exposed: Secondary | ICD-10-CM | POA: Diagnosis not present

## 2018-02-25 DIAGNOSIS — Z9181 History of falling: Secondary | ICD-10-CM | POA: Diagnosis not present

## 2018-02-25 DIAGNOSIS — I87322 Chronic venous hypertension (idiopathic) with inflammation of left lower extremity: Secondary | ICD-10-CM | POA: Diagnosis not present

## 2018-02-25 DIAGNOSIS — L97821 Non-pressure chronic ulcer of other part of left lower leg limited to breakdown of skin: Secondary | ICD-10-CM | POA: Diagnosis not present

## 2018-02-25 DIAGNOSIS — Z7901 Long term (current) use of anticoagulants: Secondary | ICD-10-CM | POA: Diagnosis not present

## 2018-02-25 DIAGNOSIS — I482 Chronic atrial fibrillation: Secondary | ICD-10-CM | POA: Diagnosis not present

## 2018-02-27 LAB — AEROBIC CULTURE W GRAM STAIN (SUPERFICIAL SPECIMEN)

## 2018-02-27 LAB — AEROBIC CULTURE  (SUPERFICIAL SPECIMEN)

## 2018-02-28 DIAGNOSIS — I482 Chronic atrial fibrillation: Secondary | ICD-10-CM | POA: Diagnosis not present

## 2018-02-28 DIAGNOSIS — I87322 Chronic venous hypertension (idiopathic) with inflammation of left lower extremity: Secondary | ICD-10-CM | POA: Diagnosis not present

## 2018-02-28 DIAGNOSIS — L97821 Non-pressure chronic ulcer of other part of left lower leg limited to breakdown of skin: Secondary | ICD-10-CM | POA: Diagnosis not present

## 2018-02-28 DIAGNOSIS — Z9181 History of falling: Secondary | ICD-10-CM | POA: Diagnosis not present

## 2018-02-28 DIAGNOSIS — Z7901 Long term (current) use of anticoagulants: Secondary | ICD-10-CM | POA: Diagnosis not present

## 2018-03-02 ENCOUNTER — Encounter (HOSPITAL_BASED_OUTPATIENT_CLINIC_OR_DEPARTMENT_OTHER): Payer: Medicare Other | Attending: Physician Assistant

## 2018-03-02 DIAGNOSIS — X58XXXA Exposure to other specified factors, initial encounter: Secondary | ICD-10-CM | POA: Diagnosis not present

## 2018-03-02 DIAGNOSIS — Z8673 Personal history of transient ischemic attack (TIA), and cerebral infarction without residual deficits: Secondary | ICD-10-CM | POA: Diagnosis not present

## 2018-03-02 DIAGNOSIS — I87332 Chronic venous hypertension (idiopathic) with ulcer and inflammation of left lower extremity: Secondary | ICD-10-CM | POA: Diagnosis not present

## 2018-03-02 DIAGNOSIS — I110 Hypertensive heart disease with heart failure: Secondary | ICD-10-CM | POA: Diagnosis not present

## 2018-03-02 DIAGNOSIS — I4891 Unspecified atrial fibrillation: Secondary | ICD-10-CM | POA: Diagnosis not present

## 2018-03-02 DIAGNOSIS — I5032 Chronic diastolic (congestive) heart failure: Secondary | ICD-10-CM | POA: Diagnosis not present

## 2018-03-02 DIAGNOSIS — S81801A Unspecified open wound, right lower leg, initial encounter: Secondary | ICD-10-CM | POA: Insufficient documentation

## 2018-03-02 DIAGNOSIS — L97812 Non-pressure chronic ulcer of other part of right lower leg with fat layer exposed: Secondary | ICD-10-CM | POA: Diagnosis not present

## 2018-03-03 ENCOUNTER — Ambulatory Visit: Payer: Medicare Other | Admitting: Neurology

## 2018-03-04 DIAGNOSIS — I87322 Chronic venous hypertension (idiopathic) with inflammation of left lower extremity: Secondary | ICD-10-CM | POA: Diagnosis not present

## 2018-03-04 DIAGNOSIS — Z9181 History of falling: Secondary | ICD-10-CM | POA: Diagnosis not present

## 2018-03-04 DIAGNOSIS — L97821 Non-pressure chronic ulcer of other part of left lower leg limited to breakdown of skin: Secondary | ICD-10-CM | POA: Diagnosis not present

## 2018-03-04 DIAGNOSIS — Z7901 Long term (current) use of anticoagulants: Secondary | ICD-10-CM | POA: Diagnosis not present

## 2018-03-04 DIAGNOSIS — I482 Chronic atrial fibrillation: Secondary | ICD-10-CM | POA: Diagnosis not present

## 2018-03-07 DIAGNOSIS — I482 Chronic atrial fibrillation: Secondary | ICD-10-CM | POA: Diagnosis not present

## 2018-03-07 DIAGNOSIS — L97821 Non-pressure chronic ulcer of other part of left lower leg limited to breakdown of skin: Secondary | ICD-10-CM | POA: Diagnosis not present

## 2018-03-07 DIAGNOSIS — Z9181 History of falling: Secondary | ICD-10-CM | POA: Diagnosis not present

## 2018-03-07 DIAGNOSIS — I87322 Chronic venous hypertension (idiopathic) with inflammation of left lower extremity: Secondary | ICD-10-CM | POA: Diagnosis not present

## 2018-03-07 DIAGNOSIS — Z7901 Long term (current) use of anticoagulants: Secondary | ICD-10-CM | POA: Diagnosis not present

## 2018-03-08 ENCOUNTER — Telehealth: Payer: Self-pay | Admitting: *Deleted

## 2018-03-08 NOTE — Telephone Encounter (Signed)
lvm for pt to call back, received paperwork from home health and pt's HR has been up a little, was checking to see if pt is OK.  If pt is, pt is to keep f/u in June.

## 2018-03-09 DIAGNOSIS — I4891 Unspecified atrial fibrillation: Secondary | ICD-10-CM | POA: Diagnosis not present

## 2018-03-09 DIAGNOSIS — I110 Hypertensive heart disease with heart failure: Secondary | ICD-10-CM | POA: Diagnosis not present

## 2018-03-09 DIAGNOSIS — I87332 Chronic venous hypertension (idiopathic) with ulcer and inflammation of left lower extremity: Secondary | ICD-10-CM | POA: Diagnosis not present

## 2018-03-09 DIAGNOSIS — S81801A Unspecified open wound, right lower leg, initial encounter: Secondary | ICD-10-CM | POA: Diagnosis not present

## 2018-03-09 DIAGNOSIS — L97812 Non-pressure chronic ulcer of other part of right lower leg with fat layer exposed: Secondary | ICD-10-CM | POA: Diagnosis not present

## 2018-03-09 DIAGNOSIS — I5032 Chronic diastolic (congestive) heart failure: Secondary | ICD-10-CM | POA: Diagnosis not present

## 2018-03-11 ENCOUNTER — Telehealth: Payer: Self-pay | Admitting: Nurse Practitioner

## 2018-03-11 DIAGNOSIS — I482 Chronic atrial fibrillation: Secondary | ICD-10-CM | POA: Diagnosis not present

## 2018-03-11 DIAGNOSIS — Z9181 History of falling: Secondary | ICD-10-CM | POA: Diagnosis not present

## 2018-03-11 DIAGNOSIS — L97821 Non-pressure chronic ulcer of other part of left lower leg limited to breakdown of skin: Secondary | ICD-10-CM | POA: Diagnosis not present

## 2018-03-11 DIAGNOSIS — I87322 Chronic venous hypertension (idiopathic) with inflammation of left lower extremity: Secondary | ICD-10-CM | POA: Diagnosis not present

## 2018-03-11 DIAGNOSIS — Z7901 Long term (current) use of anticoagulants: Secondary | ICD-10-CM | POA: Diagnosis not present

## 2018-03-11 NOTE — Telephone Encounter (Signed)
New Message:      STAT if HR is under 50 or over 120 (normal HR is 60-100 beats per minute)  1) What is your heart rate? 120  2) Do you have a log of your heart rate readings (document readings)? Monday it was 114  3) Do you have any other symptoms? Pt is feeling fatigue

## 2018-03-11 NOTE — Telephone Encounter (Signed)
I returned call to [place], Home health RN. She states patient's HR was sustaining at 110s-120 during visit. BP 138/72 RR 18.   I spoke with patient, she states feeling fatigue which has been progressing for a while. Patient denies chest pain, sob or syncope. Patient on amiodarone 100 mg daily and Xarelto 15 mg daily. I informed patient to continue to monitor HR and confirmed that she is compliant with Xarelto. Will forward to Dr. Lovena Le for further recommendations. Patient verbalized understanding and thankful for the call

## 2018-03-13 ENCOUNTER — Other Ambulatory Visit: Payer: Self-pay | Admitting: Nurse Practitioner

## 2018-03-14 DIAGNOSIS — I87322 Chronic venous hypertension (idiopathic) with inflammation of left lower extremity: Secondary | ICD-10-CM | POA: Diagnosis not present

## 2018-03-14 DIAGNOSIS — I129 Hypertensive chronic kidney disease with stage 1 through stage 4 chronic kidney disease, or unspecified chronic kidney disease: Secondary | ICD-10-CM | POA: Diagnosis not present

## 2018-03-14 DIAGNOSIS — I482 Chronic atrial fibrillation: Secondary | ICD-10-CM | POA: Diagnosis not present

## 2018-03-14 DIAGNOSIS — N183 Chronic kidney disease, stage 3 (moderate): Secondary | ICD-10-CM | POA: Diagnosis not present

## 2018-03-14 DIAGNOSIS — I509 Heart failure, unspecified: Secondary | ICD-10-CM | POA: Diagnosis not present

## 2018-03-14 DIAGNOSIS — Z7901 Long term (current) use of anticoagulants: Secondary | ICD-10-CM | POA: Diagnosis not present

## 2018-03-14 DIAGNOSIS — Z9181 History of falling: Secondary | ICD-10-CM | POA: Diagnosis not present

## 2018-03-14 DIAGNOSIS — I4891 Unspecified atrial fibrillation: Secondary | ICD-10-CM | POA: Diagnosis not present

## 2018-03-14 DIAGNOSIS — L97821 Non-pressure chronic ulcer of other part of left lower leg limited to breakdown of skin: Secondary | ICD-10-CM | POA: Diagnosis not present

## 2018-03-14 NOTE — Telephone Encounter (Signed)
She needs an ECG if her HR's are in the 120 range and if she is out of rhythm, I would increase amio to 200 mg twice daily for a week then 200 mg daily. Repeat ECG in 4 weeks. If she cannot breath she will need to go to ED. Do not stop Xarelto or miss a dose as she will require DCCV if she does not convert. GT

## 2018-03-14 NOTE — Telephone Encounter (Signed)
S/w pt does not want to come in to office at this time is very overwhelmed will keep upcoming appt.  Pt is seeing Dr.Stoneking today and will get EKG in that office.

## 2018-03-14 NOTE — Telephone Encounter (Signed)
Needs visit to be seen with EKG.

## 2018-03-14 NOTE — Telephone Encounter (Signed)
Pt to f/u with [name] in June.  Pt does not want to make any changes or come to our office before.

## 2018-03-14 NOTE — Telephone Encounter (Signed)
Left message on machine for pt to contact the office.   

## 2018-03-16 DIAGNOSIS — S81801A Unspecified open wound, right lower leg, initial encounter: Secondary | ICD-10-CM | POA: Diagnosis not present

## 2018-03-16 DIAGNOSIS — I5032 Chronic diastolic (congestive) heart failure: Secondary | ICD-10-CM | POA: Diagnosis not present

## 2018-03-16 DIAGNOSIS — I87332 Chronic venous hypertension (idiopathic) with ulcer and inflammation of left lower extremity: Secondary | ICD-10-CM | POA: Diagnosis not present

## 2018-03-16 DIAGNOSIS — I4891 Unspecified atrial fibrillation: Secondary | ICD-10-CM | POA: Diagnosis not present

## 2018-03-16 DIAGNOSIS — L97812 Non-pressure chronic ulcer of other part of right lower leg with fat layer exposed: Secondary | ICD-10-CM | POA: Diagnosis not present

## 2018-03-16 DIAGNOSIS — I110 Hypertensive heart disease with heart failure: Secondary | ICD-10-CM | POA: Diagnosis not present

## 2018-03-17 DIAGNOSIS — L97821 Non-pressure chronic ulcer of other part of left lower leg limited to breakdown of skin: Secondary | ICD-10-CM | POA: Diagnosis not present

## 2018-03-17 DIAGNOSIS — Z7901 Long term (current) use of anticoagulants: Secondary | ICD-10-CM | POA: Diagnosis not present

## 2018-03-17 DIAGNOSIS — I87322 Chronic venous hypertension (idiopathic) with inflammation of left lower extremity: Secondary | ICD-10-CM | POA: Diagnosis not present

## 2018-03-17 DIAGNOSIS — I482 Chronic atrial fibrillation: Secondary | ICD-10-CM | POA: Diagnosis not present

## 2018-03-17 DIAGNOSIS — Z9181 History of falling: Secondary | ICD-10-CM | POA: Diagnosis not present

## 2018-03-18 DIAGNOSIS — Z7901 Long term (current) use of anticoagulants: Secondary | ICD-10-CM | POA: Diagnosis not present

## 2018-03-18 DIAGNOSIS — I482 Chronic atrial fibrillation: Secondary | ICD-10-CM | POA: Diagnosis not present

## 2018-03-18 DIAGNOSIS — I87322 Chronic venous hypertension (idiopathic) with inflammation of left lower extremity: Secondary | ICD-10-CM | POA: Diagnosis not present

## 2018-03-18 DIAGNOSIS — Z9181 History of falling: Secondary | ICD-10-CM | POA: Diagnosis not present

## 2018-03-18 DIAGNOSIS — L97821 Non-pressure chronic ulcer of other part of left lower leg limited to breakdown of skin: Secondary | ICD-10-CM | POA: Diagnosis not present

## 2018-03-21 DIAGNOSIS — Z7901 Long term (current) use of anticoagulants: Secondary | ICD-10-CM | POA: Diagnosis not present

## 2018-03-21 DIAGNOSIS — Z9181 History of falling: Secondary | ICD-10-CM | POA: Diagnosis not present

## 2018-03-21 DIAGNOSIS — I482 Chronic atrial fibrillation: Secondary | ICD-10-CM | POA: Diagnosis not present

## 2018-03-21 DIAGNOSIS — I87322 Chronic venous hypertension (idiopathic) with inflammation of left lower extremity: Secondary | ICD-10-CM | POA: Diagnosis not present

## 2018-03-21 DIAGNOSIS — L97821 Non-pressure chronic ulcer of other part of left lower leg limited to breakdown of skin: Secondary | ICD-10-CM | POA: Diagnosis not present

## 2018-03-23 DIAGNOSIS — I87332 Chronic venous hypertension (idiopathic) with ulcer and inflammation of left lower extremity: Secondary | ICD-10-CM | POA: Diagnosis not present

## 2018-03-23 DIAGNOSIS — I5032 Chronic diastolic (congestive) heart failure: Secondary | ICD-10-CM | POA: Diagnosis not present

## 2018-03-23 DIAGNOSIS — I4891 Unspecified atrial fibrillation: Secondary | ICD-10-CM | POA: Diagnosis not present

## 2018-03-23 DIAGNOSIS — S81801A Unspecified open wound, right lower leg, initial encounter: Secondary | ICD-10-CM | POA: Diagnosis not present

## 2018-03-23 DIAGNOSIS — I110 Hypertensive heart disease with heart failure: Secondary | ICD-10-CM | POA: Diagnosis not present

## 2018-03-23 DIAGNOSIS — L97812 Non-pressure chronic ulcer of other part of right lower leg with fat layer exposed: Secondary | ICD-10-CM | POA: Diagnosis not present

## 2018-03-25 ENCOUNTER — Telehealth: Payer: Self-pay | Admitting: Nurse Practitioner

## 2018-03-25 DIAGNOSIS — Z7901 Long term (current) use of anticoagulants: Secondary | ICD-10-CM | POA: Diagnosis not present

## 2018-03-25 DIAGNOSIS — L97821 Non-pressure chronic ulcer of other part of left lower leg limited to breakdown of skin: Secondary | ICD-10-CM | POA: Diagnosis not present

## 2018-03-25 DIAGNOSIS — Z9181 History of falling: Secondary | ICD-10-CM | POA: Diagnosis not present

## 2018-03-25 DIAGNOSIS — I482 Chronic atrial fibrillation: Secondary | ICD-10-CM | POA: Diagnosis not present

## 2018-03-25 DIAGNOSIS — I87322 Chronic venous hypertension (idiopathic) with inflammation of left lower extremity: Secondary | ICD-10-CM | POA: Diagnosis not present

## 2018-03-25 NOTE — Telephone Encounter (Signed)
Returned call to [facility] nurse.  Advised that [name] had spoken with [name], [name] had refused to come to office to be assessed. Advised nurse that we cannot make any medication change recommendations until [name] comes to office to be evaluated.  Notified [place] nurse if she can get [name] to agree to appt this nurse will get her in with Dr. Lovena Le next week. [place] nurse indicates understanding.  Will discuss with [name]. Await further needs.

## 2018-03-25 NOTE — Telephone Encounter (Signed)
New message  Levada Dy at [facility] at [facility] calling to report HR 132, BP 138/74 today.  Patient still has swelling Levada Dy [phone]  Pt c/o swelling: STAT is pt has developed SOB within 24 hours  1) How much weight have you gained and in what time span? N/A  2) If swelling, where is the swelling located? LEGS  3) Are you currently taking a fluid pill? YES  4) Are you currently SOB? YES  5) Do you have a log of your daily weights (if so, list)? NO  6) Have you gained 3 pounds in a day or 5 pounds in a week? N/A  7) Have you traveled recently? NO

## 2018-03-25 NOTE — Telephone Encounter (Signed)
I spoke with Levada Dy (pt's home health nurse) She reports pt's heart rate has been increased recently. Today heart rate is 132. (checked twice) On 5/13 it was 129, 5/17 was 110, 5/20 was 92. See phone note from 5/10.  Pt did see Dr. Felipa Eth on 5/10 and was started on Cardizem CD 120 mg daily. Levada Dy reports she has faxed notes to our office recently. Pt has swelling in legs from knees to feet. This has increased significantly in the past week.  Prior to this week had mild swelling. Pt is a little more short of breath than normal.  Pt feels lasix is not working as well now as in the past.  Takes lasix 40 mg daily. Will forward to Dr Lovena Le for review.  Levada Dy asks we call her and pt with recommendations.

## 2018-03-28 ENCOUNTER — Other Ambulatory Visit: Payer: Self-pay | Admitting: Neurology

## 2018-03-28 DIAGNOSIS — Z9181 History of falling: Secondary | ICD-10-CM | POA: Diagnosis not present

## 2018-03-28 DIAGNOSIS — Z7901 Long term (current) use of anticoagulants: Secondary | ICD-10-CM | POA: Diagnosis not present

## 2018-03-28 DIAGNOSIS — I87322 Chronic venous hypertension (idiopathic) with inflammation of left lower extremity: Secondary | ICD-10-CM | POA: Diagnosis not present

## 2018-03-28 DIAGNOSIS — I482 Chronic atrial fibrillation: Secondary | ICD-10-CM | POA: Diagnosis not present

## 2018-03-28 DIAGNOSIS — L97821 Non-pressure chronic ulcer of other part of left lower leg limited to breakdown of skin: Secondary | ICD-10-CM | POA: Diagnosis not present

## 2018-03-30 DIAGNOSIS — S81801A Unspecified open wound, right lower leg, initial encounter: Secondary | ICD-10-CM | POA: Diagnosis not present

## 2018-03-30 DIAGNOSIS — I5032 Chronic diastolic (congestive) heart failure: Secondary | ICD-10-CM | POA: Diagnosis not present

## 2018-03-30 DIAGNOSIS — I4891 Unspecified atrial fibrillation: Secondary | ICD-10-CM | POA: Diagnosis not present

## 2018-03-30 DIAGNOSIS — I87332 Chronic venous hypertension (idiopathic) with ulcer and inflammation of left lower extremity: Secondary | ICD-10-CM | POA: Diagnosis not present

## 2018-03-30 DIAGNOSIS — L97812 Non-pressure chronic ulcer of other part of right lower leg with fat layer exposed: Secondary | ICD-10-CM | POA: Diagnosis not present

## 2018-03-30 DIAGNOSIS — I110 Hypertensive heart disease with heart failure: Secondary | ICD-10-CM | POA: Diagnosis not present

## 2018-04-01 DIAGNOSIS — Z9181 History of falling: Secondary | ICD-10-CM | POA: Diagnosis not present

## 2018-04-01 DIAGNOSIS — Z7901 Long term (current) use of anticoagulants: Secondary | ICD-10-CM | POA: Diagnosis not present

## 2018-04-01 DIAGNOSIS — I482 Chronic atrial fibrillation: Secondary | ICD-10-CM | POA: Diagnosis not present

## 2018-04-01 DIAGNOSIS — L97821 Non-pressure chronic ulcer of other part of left lower leg limited to breakdown of skin: Secondary | ICD-10-CM | POA: Diagnosis not present

## 2018-04-01 DIAGNOSIS — I87322 Chronic venous hypertension (idiopathic) with inflammation of left lower extremity: Secondary | ICD-10-CM | POA: Diagnosis not present

## 2018-04-03 ENCOUNTER — Other Ambulatory Visit: Payer: Self-pay | Admitting: Nurse Practitioner

## 2018-04-04 ENCOUNTER — Telehealth: Payer: Self-pay | Admitting: Nurse Practitioner

## 2018-04-04 DIAGNOSIS — Z7901 Long term (current) use of anticoagulants: Secondary | ICD-10-CM | POA: Diagnosis not present

## 2018-04-04 DIAGNOSIS — Z9181 History of falling: Secondary | ICD-10-CM | POA: Diagnosis not present

## 2018-04-04 DIAGNOSIS — I87322 Chronic venous hypertension (idiopathic) with inflammation of left lower extremity: Secondary | ICD-10-CM | POA: Diagnosis not present

## 2018-04-04 DIAGNOSIS — I482 Chronic atrial fibrillation: Secondary | ICD-10-CM | POA: Diagnosis not present

## 2018-04-04 DIAGNOSIS — L97821 Non-pressure chronic ulcer of other part of left lower leg limited to breakdown of skin: Secondary | ICD-10-CM | POA: Diagnosis not present

## 2018-04-04 NOTE — Telephone Encounter (Signed)
S/w Vicente Males from Medical Records at Dr.Stoneking's office [phone] will fax recent labs and ov note.

## 2018-04-04 NOTE — Telephone Encounter (Signed)
Pt called due to hh nurse stated pt's swelling is getting worse and needed to come in this week.  Only one slot available and pt took this appt but did not know if pt can get ride, has a hard time getting transportation.  Stated if pt has to drive herself pt will call to meet downstairs to give a wheelchair ride.  Pt is the same on SOB. Had pt weigh at home while on phone 118lbs.  Pt Stated Cardizem is not working and one of the side effects is swelling. Called PCP, did not help pt.  Pt stated has a lot of negative things to tell [name] about PCP.  Will send to [place] to [place].

## 2018-04-04 NOTE — Telephone Encounter (Signed)
I need her most recent OV notes and labs from Dr. Felipa Eth.

## 2018-04-04 NOTE — Telephone Encounter (Signed)
New Message:      [name] is calling from [facility] home stating if we can possibly get pt in here this week to see Servando Snare due to still having swelling in her legs. Glenard Haring states she has been calling for the last couple of weeks for the same issue for this pt.

## 2018-04-05 DIAGNOSIS — R609 Edema, unspecified: Secondary | ICD-10-CM | POA: Diagnosis not present

## 2018-04-05 DIAGNOSIS — R110 Nausea: Secondary | ICD-10-CM | POA: Diagnosis not present

## 2018-04-05 DIAGNOSIS — R457 State of emotional shock and stress, unspecified: Secondary | ICD-10-CM | POA: Diagnosis not present

## 2018-04-05 DIAGNOSIS — Z09 Encounter for follow-up examination after completed treatment for conditions other than malignant neoplasm: Secondary | ICD-10-CM | POA: Diagnosis not present

## 2018-04-05 DIAGNOSIS — R0902 Hypoxemia: Secondary | ICD-10-CM | POA: Diagnosis not present

## 2018-04-05 DIAGNOSIS — M25561 Pain in right knee: Secondary | ICD-10-CM | POA: Diagnosis not present

## 2018-04-05 DIAGNOSIS — Z96651 Presence of right artificial knee joint: Secondary | ICD-10-CM | POA: Diagnosis not present

## 2018-04-05 DIAGNOSIS — R0689 Other abnormalities of breathing: Secondary | ICD-10-CM | POA: Diagnosis not present

## 2018-04-05 DIAGNOSIS — Z96652 Presence of left artificial knee joint: Secondary | ICD-10-CM | POA: Diagnosis not present

## 2018-04-06 ENCOUNTER — Encounter (HOSPITAL_COMMUNITY): Payer: Self-pay

## 2018-04-06 ENCOUNTER — Emergency Department (HOSPITAL_COMMUNITY): Payer: Medicare Other

## 2018-04-06 ENCOUNTER — Ambulatory Visit: Payer: PRIVATE HEALTH INSURANCE | Admitting: Nurse Practitioner

## 2018-04-06 ENCOUNTER — Inpatient Hospital Stay (HOSPITAL_COMMUNITY)
Admission: EM | Admit: 2018-04-06 | Discharge: 2018-04-11 | DRG: 308 | Disposition: A | Discharge status: Skilled Nursing Facility | Payer: Medicare Other | Attending: Internal Medicine | Admitting: Internal Medicine

## 2018-04-06 ENCOUNTER — Inpatient Hospital Stay (HOSPITAL_COMMUNITY): Payer: Medicare Other

## 2018-04-06 ENCOUNTER — Encounter (HOSPITAL_BASED_OUTPATIENT_CLINIC_OR_DEPARTMENT_OTHER): Payer: No Typology Code available for payment source | Attending: Physician Assistant

## 2018-04-06 ENCOUNTER — Telehealth: Payer: Self-pay | Admitting: *Deleted

## 2018-04-06 ENCOUNTER — Other Ambulatory Visit: Payer: Self-pay

## 2018-04-06 DIAGNOSIS — I484 Atypical atrial flutter: Secondary | ICD-10-CM | POA: Diagnosis present

## 2018-04-06 DIAGNOSIS — R2681 Unsteadiness on feet: Secondary | ICD-10-CM | POA: Diagnosis not present

## 2018-04-06 DIAGNOSIS — R279 Unspecified lack of coordination: Secondary | ICD-10-CM | POA: Diagnosis not present

## 2018-04-06 DIAGNOSIS — I110 Hypertensive heart disease with heart failure: Secondary | ICD-10-CM | POA: Diagnosis present

## 2018-04-06 DIAGNOSIS — Z96611 Presence of right artificial shoulder joint: Secondary | ICD-10-CM | POA: Diagnosis present

## 2018-04-06 DIAGNOSIS — Z743 Need for continuous supervision: Secondary | ICD-10-CM | POA: Diagnosis not present

## 2018-04-06 DIAGNOSIS — Z96653 Presence of artificial knee joint, bilateral: Secondary | ICD-10-CM | POA: Insufficient documentation

## 2018-04-06 DIAGNOSIS — R262 Difficulty in walking, not elsewhere classified: Secondary | ICD-10-CM | POA: Diagnosis not present

## 2018-04-06 DIAGNOSIS — M810 Age-related osteoporosis without current pathological fracture: Secondary | ICD-10-CM | POA: Diagnosis present

## 2018-04-06 DIAGNOSIS — Z7901 Long term (current) use of anticoagulants: Secondary | ICD-10-CM | POA: Diagnosis not present

## 2018-04-06 DIAGNOSIS — I4891 Unspecified atrial fibrillation: Secondary | ICD-10-CM | POA: Insufficient documentation

## 2018-04-06 DIAGNOSIS — Z8673 Personal history of transient ischemic attack (TIA), and cerebral infarction without residual deficits: Secondary | ICD-10-CM | POA: Insufficient documentation

## 2018-04-06 DIAGNOSIS — Z79899 Other long term (current) drug therapy: Secondary | ICD-10-CM

## 2018-04-06 DIAGNOSIS — I509 Heart failure, unspecified: Secondary | ICD-10-CM | POA: Insufficient documentation

## 2018-04-06 DIAGNOSIS — J9600 Acute respiratory failure, unspecified whether with hypoxia or hypercapnia: Secondary | ICD-10-CM | POA: Diagnosis not present

## 2018-04-06 DIAGNOSIS — Z9842 Cataract extraction status, left eye: Secondary | ICD-10-CM

## 2018-04-06 DIAGNOSIS — M25561 Pain in right knee: Secondary | ICD-10-CM | POA: Diagnosis present

## 2018-04-06 DIAGNOSIS — R079 Chest pain, unspecified: Secondary | ICD-10-CM | POA: Diagnosis not present

## 2018-04-06 DIAGNOSIS — Z96651 Presence of right artificial knee joint: Secondary | ICD-10-CM | POA: Diagnosis present

## 2018-04-06 DIAGNOSIS — I340 Nonrheumatic mitral (valve) insufficiency: Secondary | ICD-10-CM | POA: Diagnosis not present

## 2018-04-06 DIAGNOSIS — I480 Paroxysmal atrial fibrillation: Secondary | ICD-10-CM | POA: Diagnosis not present

## 2018-04-06 DIAGNOSIS — Z800 Family history of malignant neoplasm of digestive organs: Secondary | ICD-10-CM

## 2018-04-06 DIAGNOSIS — E871 Hypo-osmolality and hyponatremia: Secondary | ICD-10-CM | POA: Diagnosis not present

## 2018-04-06 DIAGNOSIS — R339 Retention of urine, unspecified: Secondary | ICD-10-CM | POA: Diagnosis present

## 2018-04-06 DIAGNOSIS — M25512 Pain in left shoulder: Secondary | ICD-10-CM | POA: Diagnosis present

## 2018-04-06 DIAGNOSIS — R0602 Shortness of breath: Secondary | ICD-10-CM | POA: Diagnosis not present

## 2018-04-06 DIAGNOSIS — I5033 Acute on chronic diastolic (congestive) heart failure: Secondary | ICD-10-CM | POA: Diagnosis not present

## 2018-04-06 DIAGNOSIS — Z823 Family history of stroke: Secondary | ICD-10-CM

## 2018-04-06 DIAGNOSIS — Z885 Allergy status to narcotic agent status: Secondary | ICD-10-CM

## 2018-04-06 DIAGNOSIS — F5104 Psychophysiologic insomnia: Secondary | ICD-10-CM | POA: Diagnosis present

## 2018-04-06 DIAGNOSIS — I5043 Acute on chronic combined systolic (congestive) and diastolic (congestive) heart failure: Secondary | ICD-10-CM | POA: Diagnosis present

## 2018-04-06 DIAGNOSIS — I248 Other forms of acute ischemic heart disease: Secondary | ICD-10-CM | POA: Diagnosis present

## 2018-04-06 DIAGNOSIS — I10 Essential (primary) hypertension: Secondary | ICD-10-CM | POA: Diagnosis not present

## 2018-04-06 DIAGNOSIS — S81801A Unspecified open wound, right lower leg, initial encounter: Secondary | ICD-10-CM | POA: Diagnosis present

## 2018-04-06 DIAGNOSIS — R41841 Cognitive communication deficit: Secondary | ICD-10-CM | POA: Diagnosis not present

## 2018-04-06 DIAGNOSIS — Z9841 Cataract extraction status, right eye: Secondary | ICD-10-CM | POA: Diagnosis not present

## 2018-04-06 DIAGNOSIS — L97812 Non-pressure chronic ulcer of other part of right lower leg with fat layer exposed: Secondary | ICD-10-CM | POA: Insufficient documentation

## 2018-04-06 DIAGNOSIS — M1990 Unspecified osteoarthritis, unspecified site: Secondary | ICD-10-CM | POA: Diagnosis present

## 2018-04-06 DIAGNOSIS — E785 Hyperlipidemia, unspecified: Secondary | ICD-10-CM | POA: Diagnosis present

## 2018-04-06 DIAGNOSIS — X58XXXA Exposure to other specified factors, initial encounter: Secondary | ICD-10-CM | POA: Diagnosis present

## 2018-04-06 DIAGNOSIS — D649 Anemia, unspecified: Secondary | ICD-10-CM

## 2018-04-06 DIAGNOSIS — Z79890 Hormone replacement therapy (postmenopausal): Secondary | ICD-10-CM

## 2018-04-06 DIAGNOSIS — M6281 Muscle weakness (generalized): Secondary | ICD-10-CM | POA: Diagnosis not present

## 2018-04-06 DIAGNOSIS — R278 Other lack of coordination: Secondary | ICD-10-CM | POA: Diagnosis not present

## 2018-04-06 LAB — BASIC METABOLIC PANEL
ANION GAP: 12 (ref 5–15)
BUN: 22 mg/dL — ABNORMAL HIGH (ref 6–20)
CALCIUM: 9.2 mg/dL (ref 8.9–10.3)
CO2: 27 mmol/L (ref 22–32)
Chloride: 89 mmol/L — ABNORMAL LOW (ref 101–111)
Creatinine, Ser: 0.89 mg/dL (ref 0.44–1.00)
GFR calc Af Amer: >60 mL/min (ref >60)
GFR, EST NON AFRICAN AMERICAN: 57 mL/min — AB (ref >60)
GLUCOSE: 150 mg/dL — AB (ref 65–99)
POTASSIUM: 3.6 mmol/L (ref 3.5–5.1)
SODIUM: 128 mmol/L — AB (ref 135–145)

## 2018-04-06 LAB — URINALYSIS, ROUTINE W REFLEX MICROSCOPIC
BILIRUBIN URINE: NEGATIVE
Glucose, UA: NEGATIVE mg/dL
HGB URINE DIPSTICK: NEGATIVE
Ketones, ur: NEGATIVE mg/dL
Leukocytes, UA: NEGATIVE
Nitrite: NEGATIVE
PH: 5 (ref 5.0–8.0)
Protein, ur: NEGATIVE mg/dL
SPECIFIC GRAVITY, URINE: 1.013 (ref 1.005–1.030)

## 2018-04-06 LAB — CBC
HEMATOCRIT: 34.7 % — AB (ref 36.0–46.0)
HEMOGLOBIN: 11.3 g/dL — AB (ref 12.0–15.0)
MCH: 29.7 pg (ref 26.0–34.0)
MCHC: 32.6 g/dL (ref 30.0–36.0)
MCV: 91.3 fL (ref 78.0–100.0)
Platelets: 170 10*3/uL (ref 150–400)
RBC: 3.8 MIL/uL — ABNORMAL LOW (ref 3.87–5.11)
RDW: 13 % (ref 11.5–15.5)
WBC: 8.1 10*3/uL (ref 4.0–10.5)

## 2018-04-06 LAB — TROPONIN I
TROPONIN I: 0.03 ng/mL — AB (ref <0.03)
TROPONIN I: 0.03 ng/mL — AB (ref <0.03)
Troponin I: 0.03 ng/mL (ref <0.03)

## 2018-04-06 LAB — BRAIN NATRIURETIC PEPTIDE: B NATRIURETIC PEPTIDE 5: 388.3 pg/mL — AB (ref 0.0–100.0)

## 2018-04-06 LAB — I-STAT TROPONIN, ED: TROPONIN I, POC: 0.02 ng/mL (ref 0.00–0.08)

## 2018-04-06 LAB — CBG MONITORING, ED: Glucose-Capillary: 100 mg/dL — ABNORMAL HIGH (ref 65–99)

## 2018-04-06 LAB — ECHOCARDIOGRAM COMPLETE
Height: 59 in
Weight: 1888 oz

## 2018-04-06 LAB — TSH: TSH: 0.837 u[IU]/mL (ref 0.350–4.500)

## 2018-04-06 LAB — MAGNESIUM: Magnesium: 1.8 mg/dL (ref 1.7–2.4)

## 2018-04-06 MED ORDER — AMIODARONE LOAD VIA INFUSION
150.0000 mg | Freq: Once | INTRAVENOUS | Status: DC
  Filled 2018-04-06: qty 83.34

## 2018-04-06 MED ORDER — FUROSEMIDE 10 MG/ML IJ SOLN
20.0000 mg | Freq: Two times a day (BID) | INTRAMUSCULAR | Status: DC
  Administered 2018-04-06 – 2018-04-07 (×3): 20 mg via INTRAVENOUS
  Filled 2018-04-06 (×3): qty 2

## 2018-04-06 MED ORDER — SODIUM CHLORIDE 0.9 % IV SOLN: 250.0000 mL | INTRAVENOUS | Status: DC | PRN

## 2018-04-06 MED ORDER — CYCLOSPORINE 0.05 % OP EMUL
1.0000 [drp] | Freq: Two times a day (BID) | OPHTHALMIC | Status: DC
  Administered 2018-04-06 – 2018-04-11 (×10): 1 [drp] via OPHTHALMIC
  Filled 2018-04-06 (×13): qty 1

## 2018-04-06 MED ORDER — METOPROLOL TARTRATE 25 MG PO TABS
25.0000 mg | ORAL_TABLET | Freq: Two times a day (BID) | ORAL | Status: DC
  Administered 2018-04-06 – 2018-04-08 (×5): 25 mg via ORAL
  Filled 2018-04-06 (×5): qty 1

## 2018-04-06 MED ORDER — SODIUM CHLORIDE 0.9% FLUSH: 3.0000 mL | INTRAVENOUS | Status: DC | PRN

## 2018-04-06 MED ORDER — AMIODARONE HCL IN DEXTROSE 360-4.14 MG/200ML-% IV SOLN
60.0000 mg/h | INTRAVENOUS | Status: DC
  Administered 2018-04-06: 60 mg/h via INTRAVENOUS

## 2018-04-06 MED ORDER — AMIODARONE HCL IN DEXTROSE 360-4.14 MG/200ML-% IV SOLN: 60.0000 mg/h | INTRAVENOUS | Status: DC

## 2018-04-06 MED ORDER — RIVASTIGMINE TARTRATE 1.5 MG PO CAPS
4.5000 mg | ORAL_CAPSULE | Freq: Two times a day (BID) | ORAL | Status: DC
  Administered 2018-04-06 – 2018-04-11 (×10): 4.5 mg via ORAL
  Filled 2018-04-06 (×12): qty 3

## 2018-04-06 MED ORDER — AMIODARONE HCL IN DEXTROSE 360-4.14 MG/200ML-% IV SOLN: 30.0000 mg/h | INTRAVENOUS | Status: DC

## 2018-04-06 MED ORDER — MELATONIN 3 MG PO TABS
9.0000 mg | ORAL_TABLET | Freq: Every evening | ORAL | Status: DC | PRN
  Filled 2018-04-06: qty 3

## 2018-04-06 MED ORDER — DILTIAZEM HCL ER COATED BEADS 120 MG PO CP24
120.0000 mg | ORAL_CAPSULE | Freq: Every day | ORAL | Status: DC
  Administered 2018-04-06: 120 mg via ORAL
  Filled 2018-04-06: qty 1

## 2018-04-06 MED ORDER — SODIUM CHLORIDE 0.9% FLUSH
3.0000 mL | Freq: Two times a day (BID) | INTRAVENOUS | Status: DC
  Administered 2018-04-08 – 2018-04-11 (×8): 3 mL via INTRAVENOUS

## 2018-04-06 MED ORDER — FUROSEMIDE 10 MG/ML IJ SOLN: 40.0000 mg | Freq: Two times a day (BID) | INTRAMUSCULAR | Status: DC

## 2018-04-06 MED ORDER — AMIODARONE LOAD VIA INFUSION
150.0000 mg | Freq: Once | INTRAVENOUS | Status: DC
  Administered 2018-04-06: 150 mg via INTRAVENOUS
  Filled 2018-04-06 (×2): qty 83.34

## 2018-04-06 MED ORDER — ZOLPIDEM TARTRATE 5 MG PO TABS
5.0000 mg | ORAL_TABLET | Freq: Every evening | ORAL | Status: DC | PRN
  Administered 2018-04-08 – 2018-04-10 (×3): 5 mg via ORAL
  Filled 2018-04-06 (×3): qty 1

## 2018-04-06 MED ORDER — ONDANSETRON HCL 4 MG/2ML IJ SOLN
4.0000 mg | Freq: Four times a day (QID) | INTRAMUSCULAR | Status: DC | PRN
  Administered 2018-04-06 – 2018-04-10 (×3): 4 mg via INTRAVENOUS
  Filled 2018-04-06 (×3): qty 2

## 2018-04-06 MED ORDER — AMIODARONE HCL IN DEXTROSE 360-4.14 MG/200ML-% IV SOLN
30.0000 mg/h | INTRAVENOUS | Status: DC
  Administered 2018-04-06 – 2018-04-08 (×4): 30 mg/h via INTRAVENOUS
  Filled 2018-04-06 (×3): qty 200

## 2018-04-06 MED ORDER — POTASSIUM CHLORIDE CRYS ER 20 MEQ PO TBCR
40.0000 meq | EXTENDED_RELEASE_TABLET | ORAL | Status: AC
  Administered 2018-04-06: 40 meq via ORAL
  Filled 2018-04-06: qty 2

## 2018-04-06 MED ORDER — ALBUTEROL SULFATE (2.5 MG/3ML) 0.083% IN NEBU
5.0000 mg | INHALATION_SOLUTION | Freq: Once | RESPIRATORY_TRACT | Status: AC
  Administered 2018-04-06: 5 mg via RESPIRATORY_TRACT
  Filled 2018-04-06: qty 6

## 2018-04-06 MED ORDER — RIVAROXABAN 15 MG PO TABS
15.0000 mg | ORAL_TABLET | Freq: Every day | ORAL | Status: DC
  Administered 2018-04-06 – 2018-04-10 (×5): 15 mg via ORAL
  Filled 2018-04-06 (×6): qty 1

## 2018-04-06 MED ORDER — AMIODARONE HCL IN DEXTROSE 360-4.14 MG/200ML-% IV SOLN
60.0000 mg/h | INTRAVENOUS | Status: DC
  Filled 2018-04-06: qty 400

## 2018-04-06 MED ORDER — NITROGLYCERIN 0.4 MG SL SUBL: 0.4000 mg | SUBLINGUAL_TABLET | SUBLINGUAL | Status: DC | PRN

## 2018-04-06 MED ORDER — AMIODARONE HCL 100 MG PO TABS
100.0000 mg | ORAL_TABLET | Freq: Every day | ORAL | Status: DC
  Administered 2018-04-06: 100 mg via ORAL
  Filled 2018-04-06: qty 1

## 2018-04-06 MED ORDER — FUROSEMIDE 10 MG/ML IJ SOLN
20.0000 mg | Freq: Once | INTRAMUSCULAR | Status: AC
Start: 2018-04-06 — End: 2018-04-06
  Administered 2018-04-06: 20 mg via INTRAVENOUS
  Filled 2018-04-06: qty 2

## 2018-04-06 MED ORDER — ACETAMINOPHEN 325 MG PO TABS: 650.0000 mg | ORAL_TABLET | ORAL | Status: DC | PRN

## 2018-04-06 NOTE — Progress Notes (Signed)
PROGRESS NOTE                                                                                                                                                                                                             Patient Demographics:    [name], is a 84 y.o. female, DOB - [birth year], UUV:[id number]  Admit date - 04/06/2018   Admitting Physician Norval Morton, MD  Outpatient Primary MD for the patient is Lajean Manes, MD  LOS - 0   Chief Complaint  Patient presents with  . Shortness of Breath       Brief Narrative   84 y.o. female with medical history significant of chronic atrial fibrillation on chronic anticoagulation, chronic diastolic CHF, HTN, HLD, OA, history of CVA; who presents with complaints of progressively worsening shortness of breath, work-up significant for A. fib with RVR, and CHF.   Subjective:    [name] today has, No headache, No chest pain, No abdominal pain - No Nausea, she reports dyspnea, and right knee pain .   Assessment  & Plan :    Principal Problem:   Acute on chronic diastolic congestive heart failure (HCC) Active Problems:   Long term (current) use of anticoagulants   Essential hypertension   PAF (paroxysmal atrial fibrillation) (HCC)   Hyponatremia   Acute respiratory failure (HCC)   Normocytic normochromic anemia   Acute on chronic diastolic CHF  -Most recent echo 2016, with a preserved EF, grade 1 diastolic dysfunction, evidence of volume overload and imaging, as well elevated BNP, +2 edema bilaterally . -Her CHF most likely provoked by A. fib with RVR. -Cardiology consulted, continue with daily weight, strict ins and outs. -Follow on repeat echo  A. fib with RVR -Per patient she was recently started on diltiazem for better heart rate control, continue with home dose amiodarone, and I will defer further management for initiation of new heart rate controlling medication up to  cardiology. -Continue with Xarelto for anticoagulation  Chest pain -  Acute.  Initial troponin negative.  Patient reports having chest pain relieved with nitroglycerin -Following repeat echo  Hyponatremia -Secondary to volume overload, continue to monitor and IV diuresis, continue with fluid restriction.  Right leg wound - Wound care consult  Essential hypertension - Continue medications as seen  above  Code Status : Full code  Family Communication  : None at bedside  Disposition Plan  : Home once stable  Consults  : Cardiology  Procedures  : None  DVT Prophylaxis  : On Xarelto  Lab Results  Component Value Date   PLT 170 04/06/2018    Antibiotics  :    Anti-infectives (From admission, onward)   None        Objective:   Vitals:   04/06/18 0845 04/06/18 0855 04/06/18 0951 04/06/18 1000  BP: 109/71 112/60 113/61 108/68  Pulse: (!) 126  (!) 134 (!) 128  Resp: 17  17 15   Temp:      TempSrc:      SpO2: 99%  99% 99%  Weight:      Height:        Wt Readings from Last 3 Encounters:  04/06/18 53.5 kg (118 lb)  02/10/18 55.3 kg (122 lb)  10/13/17 55.7 kg (122 lb 12.8 oz)    No intake or output data in the 24 hours ending 04/06/18 1120   Physical Exam  Awake Alert, Oriented X 3,  Symmetrical Chest wall movement, Good air movement bilaterally, bibasilar Rales Irregularly irregular,No Gallops,Rubs or new Murmurs, No Parasternal Heave +ve B.Sounds, Abd Soft, No tenderness,  No rebound - guarding or rigidity. No Cyanosis, Clubbing , No new Rash or bruise, +2 edema B/L.    Data Review:    CBC Recent Labs  Lab 04/06/18 0240  WBC 8.1  HGB 11.3*  HCT 34.7*  PLT 170  MCV 91.3  MCH 29.7  MCHC 32.6  RDW 13.0    Chemistries  Recent Labs  Lab 04/06/18 0240 04/06/18 0520  NA 128*  --   K 3.6  --   CL 89*  --   CO2 27  --   GLUCOSE 150*  --   BUN 22*  --   CREATININE 0.89  --   CALCIUM 9.2  --   MG  --  1.8    ------------------------------------------------------------------------------------------------------------------ No results for input(s): CHOL, HDL, LDLCALC, TRIG, CHOLHDL, LDLDIRECT in the last 72 hours.  No results found for: HGBA1C ------------------------------------------------------------------------------------------------------------------ No results for input(s): TSH, T4TOTAL, T3FREE, THYROIDAB in the last 72 hours.  Invalid input(s): FREET3 ------------------------------------------------------------------------------------------------------------------ No results for input(s): VITAMINB12, FOLATE, FERRITIN, TIBC, IRON, RETICCTPCT in the last 72 hours.  Coagulation profile No results for input(s): INR, PROTIME in the last 168 hours.  No results for input(s): DDIMER in the last 72 hours.  Cardiac Enzymes Recent Labs  Lab 04/06/18 0520  TROPONINI 0.03*   ------------------------------------------------------------------------------------------------------------------    Component Value Date/Time   BNP 388.3 (H) 04/06/2018 0240   BNP 322.5 (H) 06/29/2016 1606    Inpatient Medications  Scheduled Meds: . amiodarone  100 mg Oral Daily  . cycloSPORINE  1 drop Both Eyes BID  . diltiazem  120 mg Oral Daily  . furosemide  20 mg Intravenous BID  . Rivaroxaban  15 mg Oral Q supper  . rivastigmine  4.5 mg Oral BID  . sodium chloride flush  3 mL Intravenous Q12H   Continuous Infusions: . sodium chloride     PRN Meds:.sodium chloride, acetaminophen, Melatonin, nitroGLYCERIN, ondansetron (ZOFRAN) IV, sodium chloride flush, zolpidem  Micro Results No results found for this or any previous visit (from the past 240 hour(s)).  Radiology Reports Dg Chest Portable 1 View  Result Date: 04/06/2018 CLINICAL DATA:  Shortness of breath tonight. EXAM: PORTABLE CHEST 1 VIEW COMPARISON:  Frontal and lateral views  05/26/2016 FINDINGS: Mild cardiomegaly with aortic atherosclerosis.  Moderate pulmonary edema with Kerley B-lines. Small bilateral pleural effusions. No confluent airspace disease. The previously questioned nodular density in the right lower lung zone is not currently visualized. No pneumothorax. Right proximal humeral arthroplasty. Chronic change about the left shoulder. Remote right rib fractures. IMPRESSION: Cardiomegaly with moderate pulmonary edema and small pleural effusions, consistent with CHF. Electronically Signed   By: Jeb Levering M.D.   On: 04/06/2018 03:13    Time Spent in minutes  : NO Charge note as patient admitted earlier today   Phillips Climes M.D on 04/06/2018 at 11:20 AM  Between 7am to 7pm - Pager - [phone]  After 7pm go to [email or web address] - password [facility]  [facility] -  Office  [phone]

## 2018-04-06 NOTE — H&P (Addendum)
History and Physical    [name]:[id number] DOB: [birth year] DOA: 04/06/2018  Referring MD/NP/PA: Dr. Stark Jock PCP: Lajean Manes, MD  Patient coming from: Home via EMS  Chief Complaint: shortness of breath  I have personally briefly reviewed patient's old medical records in [place]   HPI: [name] is a 84 y.o. female with medical history significant of chronic atrial fibrillation on chronic anticoagulation, chronic diastolic CHF, HTN, HLD, OA, history of CVA; who presents with complaints of progressively worsening shortness of breath.  Patient reports recently been started on diltiazem by the nurse practitioner.  However, since starting the medication reports having progressively worsening swelling of her legs.  She notes that her bra is even tighter.  She complains of having shortness of breath even with minimal exertion.  Associated symptoms included nonproductive cough and chest pain that she describes as chest tightness. She has been taking her medications as prescribed including Xarelto.  She reports having a wound of her right lower extremity that she sustained in April while trying to close her car door and has been going to the wound care center to assist in healing.  Denies having significant fever, chills, nausea, vomiting, or diarrhea symptoms.  At baseline patient reports not being on oxygen.   ED Course: Upon admission to the emergency department patient was noted to be afebrile, pulse 115 127, respirations 18-26, blood pressure 102/77 until 149/80, and O2 saturation 94 to 100% on 2 L nasal cannula oxygen. Labs revealed WBC 8.1, hemoglobin 11.3, sodium 128, chloride 89, BUN 22, creatinine 0.89, glucose 150, BNP 388.3, and troponin 0.2. Chest x-ray showing cardiomegaly with moderate pulmonary edema.  Patient was given 20 mg of Lasix IV.  TRH called to admit.  Review of Systems  Constitutional: Positive for malaise/fatigue. Negative for chills and fever.  HENT: Negative for  ear discharge and nosebleeds.   Eyes: Negative for double vision and photophobia.  Respiratory: Positive for cough, shortness of breath and wheezing. Negative for sputum production.   Cardiovascular: Positive for chest pain, orthopnea and leg swelling.  Gastrointestinal: Negative for abdominal pain, nausea and vomiting.  Genitourinary: Negative for dysuria and frequency.  Musculoskeletal: Positive for joint pain and myalgias. Negative for falls.  Neurological: Negative for focal weakness and loss of consciousness.  Psychiatric/Behavioral: Negative for substance abuse. The patient has insomnia.     Past Medical History:  Diagnosis Date  . Arthritis    severe  . Atrial fibrillation ([place])   . Chronic diastolic CHF (congestive heart failure) ([place])   . Chronic insomnia   . Degenerative arthritis   . Essential hypertension   . Hypercholesteremia   . Memory deficit 05/24/2013  . Osteoporosis   . PAF (paroxysmal atrial fibrillation) ([place])    a. Intolerant to amiodarone, discontinued October 2012.  Marland Kitchen RLS (restless legs syndrome) 08/11/2016  . Sinus bradycardia   . Stroke [facility]) 2007   mini stroke or TIA with aphasia     Past Surgical History:  Procedure Laterality Date  . CATARACT EXTRACTION  2009   ou  . FEMUR FRACTURE SURGERY  8/12  . left knee  2001  . REPLACEMENT TOTAL KNEE BILATERAL Right 1999  . right intertrochanteric hip fracture status post ORIF  August 2012  . right shoulder replacement  2009     reports that she has never smoked. She has never used smokeless tobacco. She reports that she drinks about 4.2 oz of alcohol per week. She reports that she does  not use drugs.  Allergies  Allergen Reactions  . Codeine Nausea Only    Family History  Problem Relation Age of Onset  . Dementia Mother   . Stroke Mother   . Cancer - Colon Father   . Cancer Father   . Cancer Sister   . Cancer Son   . Cancer Sister   . Coronary artery disease Unknown     Prior to Admission  medications   Medication Sig Start Date End Date Taking? Authorizing Provider  alclomethasone (ACLOVATE) 0.05 % cream Apply 1 application topically as needed (rash/redness).  07/09/16   [provider]  amiodarone (PACERONE) 100 MG tablet TAKE 1 TABLET(100 MG) BY MOUTH EVERY DAY 04/04/18   Burtis Junes, NP  Cholecalciferol (VITAMIN D-3 PO) Take 1 capsule by mouth daily.    [provider]  Cyanocobalamin (VITAMIN B-12 PO) Take 1 tablet by mouth daily.    [provider]  Eszopiclone 3 MG TABS TAKE 1 TABLET BY MOUTH IMMEDIATELY BEFORE BEFORE 03/29/18   Kathrynn Ducking, MD  furosemide (LASIX) 40 MG tablet Take 0.5 tablets (20 mg total) by mouth 2 (two) times daily. May take an extra 20 mg prn weight gain of 2 pounds overnight. 12/21/17 03/21/18  Burtis Junes, NP  gabapentin (NEURONTIN) 300 MG capsule Take 300 mg by mouth once as needed.    [provider]  Melatonin 3 MG TABS Take 10 mg by mouth at bedtime as needed (for sleep).     [provider]  nitroGLYCERIN (NITROSTAT) 0.4 MG SL tablet DISSOLVE 1 TABLET UNDER THE TONGUE EVERY 5 MINUTES AS NEEDED FOR CHEST PAIN 03/14/18   Burtis Junes, NP  RESTASIS 0.05 % ophthalmic emulsion Place 1 drop into both eyes 2 (two) times daily. 03/24/16   [provider]  rivastigmine (EXELON) 4.5 MG capsule TAKE 1 CAPSULE(4.5 MG) BY MOUTH TWICE DAILY 10/28/17   Kathrynn Ducking, MD  traMADol (ULTRAM) 50 MG tablet Take 50 mg by mouth 2 (two) times daily as needed (for pain). Reported on 01/22/2016 12/01/11   [provider]  VOLTAREN 1 % GEL Apply 1 application topically daily as needed (for leg pain).  11/09/14   [provider]  XARELTO 15 MG TABS tablet TAKE 1 TABLET(15 MG) BY MOUTH DAILY WITH SUPPER 01/25/18   Evans Lance, MD    Physical Exam:  Constitutional: Elderly female who appears to be in some respiratory distress Vitals:   04/06/18 0300 04/06/18 0322 04/06/18 0345 04/06/18  0400  BP: 127/77 125/78 (!) 121/59 102/77  Pulse: (!) 122 (!) 127 (!) 124 (!) 116  Resp: (!) 26 20 20  (!) 26  Temp:      TempSrc:      SpO2:  99% 100% 98%  Weight:      Height:       Eyes: PERRL, lids and conjunctivae normal ENMT: Mucous membranes are moist. Posterior pharynx clear of any exudate or lesions.  Neck: normal, supple, no masses, no thyromegaly Respiratory: Tachypneic with positive crackles appreciated in both lung fields.  Patient talking in shortened sentences. Cardiovascular: Irregularly irregular, no murmurs / rubs / gallops.  3+ pitting bilateral extremity edema. 2+ pedal pulses. No carotid bruits.  Abdomen: no tenderness, no masses palpated. No hepatosplenomegaly. Bowel sounds positive.  Musculoskeletal: no clubbing / cyanosis. No joint deformity upper and lower extremities. Good ROM, no contractures. Normal muscle tone.  Skin: Wound of right lower extremity casted. Neurologic: CN 2-12 grossly  intact. Sensation intact, DTR normal. Strength 5/5 in all 4.  Psychiatric: Normal judgment and insight. Alert and oriented x 3. Normal mood.     Labs on Admission: I have personally reviewed following labs and imaging studies  CBC: Recent Labs  Lab 04/06/18 0240  WBC 8.1  HGB 11.3*  HCT 34.7*  MCV 91.3  PLT 315   Basic Metabolic Panel: Recent Labs  Lab 04/06/18 0240  NA 128*  K 3.6  CL 89*  CO2 27  GLUCOSE 150*  BUN 22*  CREATININE 0.89  CALCIUM 9.2   GFR: Estimated Creatinine Clearance: 33.3 mL/min (by C-G formula based on SCr of 0.89 mg/dL). Liver Function Tests: No results for input(s): AST, ALT, ALKPHOS, BILITOT, PROT, ALBUMIN in the last 168 hours. No results for input(s): LIPASE, AMYLASE in the last 168 hours. No results for input(s): AMMONIA in the last 168 hours. Coagulation Profile: No results for input(s): INR, PROTIME in the last 168 hours. Cardiac Enzymes: No results for input(s): CKTOTAL, CKMB, CKMBINDEX, TROPONINI in the last 168  hours. BNP (last 3 results) No results for input(s): PROBNP in the last 8760 hours. HbA1C: No results for input(s): HGBA1C in the last 72 hours. CBG: No results for input(s): GLUCAP in the last 168 hours. Lipid Profile: No results for input(s): CHOL, HDL, LDLCALC, TRIG, CHOLHDL, LDLDIRECT in the last 72 hours. Thyroid Function Tests: No results for input(s): TSH, T4TOTAL, FREET4, T3FREE, THYROIDAB in the last 72 hours. Anemia Panel: No results for input(s): VITAMINB12, FOLATE, FERRITIN, TIBC, IRON, RETICCTPCT in the last 72 hours. Urine analysis:    Component Value Date/Time   COLORURINE YELLOW 08/16/2011 0227   APPEARANCEUR CLOUDY (A) 08/16/2011 0227   LABSPEC 1.018 08/16/2011 0227   PHURINE 5.5 08/16/2011 0227   GLUCOSEU NEGATIVE 08/16/2011 0227   HGBUR SMALL (A) 08/16/2011 0227   BILIRUBINUR NEGATIVE 08/16/2011 0227   KETONESUR 15 (A) 08/16/2011 0227   PROTEINUR NEGATIVE 08/16/2011 0227   UROBILINOGEN 0.2 08/16/2011 0227   NITRITE POSITIVE (A) 08/16/2011 0227   LEUKOCYTESUR MODERATE (A) 08/16/2011 0227   Sepsis Labs: No results found for this or any previous visit (from the past 240 hour(s)).   Radiological Exams on Admission: Dg Chest Portable 1 View  Result Date: 04/06/2018 CLINICAL DATA:  Shortness of breath tonight. EXAM: PORTABLE CHEST 1 VIEW COMPARISON:  Frontal and lateral views 05/26/2016 FINDINGS: Mild cardiomegaly with aortic atherosclerosis. Moderate pulmonary edema with Kerley B-lines. Small bilateral pleural effusions. No confluent airspace disease. The previously questioned nodular density in the right lower lung zone is not currently visualized. No pneumothorax. Right proximal humeral arthroplasty. Chronic change about the left shoulder. Remote right rib fractures. IMPRESSION: Cardiomegaly with moderate pulmonary edema and small pleural effusions, consistent with CHF. Electronically Signed   By: Jeb Levering M.D.   On: 04/06/2018 03:13    EKG: Independently  reviewed.  Atrial flutter 133 bpm with QT prolonged at 505  Assessment/Plan Acute respiratory failure, diastolic CHF exacerbation: Acute.  Patient presents with progressively worsening swelling of the lower extremities with shortness of breath.  Chest x-ray showing CHF with moderate pulmonary edema.  BNP elevated at 388.3.  Patient was placed on nasal cannula oxygen to maintain O2 saturations and given 20 mg of Lasix IV. - Admit to a telemetry bed - Heart failure orders set  initiated  - Continuous pulse oximetry with nasal cannula oxygen as needed to keep O2 saturations >92% - Strict I&Os and daily weights - Elevate lower extremities - Lasix 20  mg IV Bid - Reassess in a.m. and adjust diuresis as needed. - Check echocardiogram - Optimize medical management as able - Message sent for cardiology to see in a.m.   Paroxysmal atrial fibrillation/flutter on chronic anticoagulant: Acute on chronic.  Patient presents with heart rates into the 130s. - Check electrolytes and replace as needed - Continue amiodarone and diltiazem as tolerated - Continue Xarelto  Chest pain: Acute.  Initial troponin negative.  Patient reports having chest pain relieved with nitroglycerin - Trend cardiac troponins  Hyponatremia: Initial sodium noted to be 128 on admission suspect hypervolemic hyponatremia. - Continue to monitor  Normocytic normochromic anemia: Hemoglobin appears to be 11.3 on admission.  Patient denies any reports of bleeding. - Continue to monitor  Right leg wound - Wound care consult  Essential hypertension - Continue medications as seen  above  DVT prophylaxis: Xarelto Code Status: Full Family Communication: No family present at bedside Disposition Plan: To be determined Consults called: None Admission status: Inpatient  Norval Morton MD [facility] Pager [phone]   If 7PM-7AM, please contact night-coverage [email or web address] Password [facility]  04/06/2018, 4:34 AM

## 2018-04-06 NOTE — ED Notes (Signed)
Breakfast tray ordered 

## 2018-04-06 NOTE — ED Notes (Signed)
ED Provider at bedside. 

## 2018-04-06 NOTE — Progress Notes (Addendum)
Report received from [facility].  Pt currently Afib RVR, will catch up on meds upon arrival to floor, as no meds have been administered prior to receipt of report.   Callback requesting betablocker be administered while patient waits to be picked up by 3e staff.

## 2018-04-06 NOTE — Telephone Encounter (Signed)
I have spoken to [name] over the phone this morning. I have assured her that she is where she needs to be at this time due to her presentation.   Will be available as needed.

## 2018-04-06 NOTE — ED Notes (Signed)
Echo at bedside

## 2018-04-06 NOTE — ED Provider Notes (Signed)
[facility] EMERGENCY DEPARTMENT Provider Note   CSN: [id number] Arrival date & time: 04/06/18  0225     History   Chief Complaint Chief Complaint  Patient presents with  . Shortness of Breath    HPI [name] is a 84 y.o. female.  Patient is an 84 year old female with past medical history of paroxysmal atrial fibrillation, CHF, hypertension presenting for evaluation of difficulty breathing.  This started yesterday and is worsening.  She reports increased swelling of both legs.  She does report taking her Lasix.  The history is provided by the patient.  Shortness of Breath  This is a recurrent problem. The average episode lasts 24 hours. The problem occurs continuously.The problem has been gradually worsening. Associated symptoms include leg swelling. Pertinent negatives include no fever, no cough and no sputum production. She has tried nothing for the symptoms. The treatment provided no relief.    Past Medical History:  Diagnosis Date  . Arthritis    severe  . Atrial fibrillation ([place])   . Chronic diastolic CHF (congestive heart failure) ([place])   . Chronic insomnia   . Degenerative arthritis   . Essential hypertension   . Hypercholesteremia   . Memory deficit 05/24/2013  . Osteoporosis   . PAF (paroxysmal atrial fibrillation) ([place])    a. Intolerant to amiodarone, discontinued October 2012.  Marland Kitchen RLS (restless legs syndrome) 08/11/2016  . Sinus bradycardia   . Stroke [facility]) 2007   mini stroke or TIA with aphasia     Patient Active Problem List   Diagnosis Date Noted  . RLS (restless legs syndrome) 08/11/2016  . Demand ischemia ([place]) 05/27/2016  . Acute on chronic diastolic congestive heart failure ([place]) 05/26/2016  . Atrial fibrillation with RVR ([place]) 05/26/2016  . Insomnia 01/22/2016  . Sinus bradycardia   . Chronic diastolic CHF (congestive heart failure) ([place])   . Essential hypertension   . PAF (paroxysmal atrial fibrillation) ([place])   . Stroke  ([place])   . Memory deficit 05/24/2013  . Arthritis 10/13/2011  . Long term (current) use of anticoagulants 07/21/2011  . Dyslipidemia 07/10/2011    Past Surgical History:  Procedure Laterality Date  . CATARACT EXTRACTION  2009   ou  . FEMUR FRACTURE SURGERY  8/12  . left knee  2001  . REPLACEMENT TOTAL KNEE BILATERAL Right 1999  . right intertrochanteric hip fracture status post ORIF  August 2012  . right shoulder replacement  2009     OB History   None      Home Medications    Prior to Admission medications   Medication Sig Start Date End Date Taking? Authorizing Provider  alclomethasone (ACLOVATE) 0.05 % cream Apply 1 application topically as needed (rash/redness).  07/09/16   [provider]  amiodarone (PACERONE) 100 MG tablet TAKE 1 TABLET(100 MG) BY MOUTH EVERY DAY 04/04/18   Burtis Junes, NP  Cholecalciferol (VITAMIN D-3 PO) Take 1 capsule by mouth daily.    [provider]  Cyanocobalamin (VITAMIN B-12 PO) Take 1 tablet by mouth daily.    [provider]  Eszopiclone 3 MG TABS TAKE 1 TABLET BY MOUTH IMMEDIATELY BEFORE BEFORE 03/29/18   Kathrynn Ducking, MD  furosemide (LASIX) 40 MG tablet Take 0.5 tablets (20 mg total) by mouth 2 (two) times daily. May take an extra 20 mg prn weight gain of 2 pounds overnight. 12/21/17 03/21/18  Burtis Junes, NP  gabapentin (NEURONTIN) 300 MG capsule Take 300 mg by mouth  once as needed.    [provider]  Melatonin 3 MG TABS Take 10 mg by mouth at bedtime as needed (for sleep).     [provider]  nitroGLYCERIN (NITROSTAT) 0.4 MG SL tablet DISSOLVE 1 TABLET UNDER THE TONGUE EVERY 5 MINUTES AS NEEDED FOR CHEST PAIN 03/14/18   Burtis Junes, NP  RESTASIS 0.05 % ophthalmic emulsion Place 1 drop into both eyes 2 (two) times daily. 03/24/16   [provider]  rivastigmine (EXELON) 4.5 MG capsule TAKE 1 CAPSULE(4.5 MG) BY MOUTH TWICE DAILY 10/28/17   Kathrynn Ducking, MD  traMADol  (ULTRAM) 50 MG tablet Take 50 mg by mouth 2 (two) times daily as needed (for pain). Reported on 01/22/2016 12/01/11   [provider]  VOLTAREN 1 % GEL Apply 1 application topically daily as needed (for leg pain).  11/09/14   [provider]  XARELTO 15 MG TABS tablet TAKE 1 TABLET(15 MG) BY MOUTH DAILY WITH SUPPER 01/25/18   Evans Lance, MD    Family History Family History  Problem Relation Age of Onset  . Dementia Mother   . Stroke Mother   . Cancer - Colon Father   . Cancer Father   . Cancer Sister   . Cancer Son   . Cancer Sister   . Coronary artery disease Unknown     Social History Social History   Tobacco Use  . Smoking status: Never Smoker  . Smokeless tobacco: Never Used  Substance Use Topics  . Alcohol use: Yes    Alcohol/week: 4.2 oz    Types: 7 Glasses of wine per week    Comment: 1 glass wine with dinner some days  . Drug use: No     Allergies   Codeine   Review of Systems Review of Systems  Constitutional: Negative for fever.  Respiratory: Positive for shortness of breath. Negative for cough and sputum production.   Cardiovascular: Positive for leg swelling.  All other systems reviewed and are negative.    Physical Exam Updated Vital Signs BP (!) 149/80 (BP Location: Right Arm)   Pulse (!) 123   Temp (!) 97.5 F (36.4 C) (Oral)   Resp (!) 26   Ht 4\' 11"  (1.499 m)   Wt 53.5 kg (118 lb)   SpO2 94%   BMI 23.83 kg/m   Physical Exam  Constitutional: She is oriented to person, place, and time. She appears well-developed and well-nourished. No distress.  HENT:  Head: Normocephalic and atraumatic.  Neck: Normal range of motion. Neck supple.  Cardiovascular: Normal rate and regular rhythm. Exam reveals no gallop and no friction rub.  No murmur heard. Pulmonary/Chest: Effort normal. No respiratory distress. She has no wheezes. She has rales in the right lower field and the left lower field.  There are rales in the bases  bilaterally.  Abdominal: Soft. Bowel sounds are normal. She exhibits no distension. There is no tenderness.  Musculoskeletal: Normal range of motion.       Right lower leg: She exhibits edema.       Left lower leg: She exhibits edema.  There is 2-3+ pitting edema of both lower extremities.  The right lower extremity is in a compression dressing.  She is receiving wound care for a sore on her leg.  Neurological: She is alert and oriented to person, place, and time.  Skin: Skin is warm and dry. She is not diaphoretic.  Nursing note and vitals reviewed.    ED  Treatments / Results  Labs (all labs ordered are listed, but only abnormal results are displayed) Labs Reviewed  CBC - Abnormal; Notable for the following components:      Result Value   RBC 3.80 (*)    Hemoglobin 11.3 (*)    HCT 34.7 (*)    All other components within normal limits  BASIC METABOLIC PANEL  BRAIN NATRIURETIC PEPTIDE  I-STAT TROPONIN, ED    EKG None  Radiology No results found.  Procedures Procedures (including critical care time)  Medications Ordered in ED Medications  furosemide (LASIX) injection 20 mg (has no administration in time range)  albuterol (PROVENTIL) (2.5 MG/3ML) 0.083% nebulizer solution 5 mg (5 mg Nebulization Given 04/06/18 0245)     Initial Impression / Assessment and Plan / ED Course  I have reviewed the triage vital signs and the nursing notes.  Pertinent labs & imaging results that were available during my care of the patient were reviewed by me and considered in my medical decision making (see chart for details).  Patient presents with shortness of breath and increased leg swelling.  Her work-up is most consistent with an exacerbation of CHF.  She was given Lasix and will be admitted to the hospitalist service under the care of Dr. Tamala Julian.  Final Clinical Impressions(s) / ED Diagnoses   Final diagnoses:  None    ED Discharge Orders    None       Veryl Speak,  MD 04/06/18 [phone]

## 2018-04-06 NOTE — ED Triage Notes (Signed)
Ems received for SOB; patient did not want to come to hospital at first but decided to. Patient was given 4mg  Zofran IV. Also c/o CP.

## 2018-04-06 NOTE — ED Notes (Signed)
Smith, MD at bedside.  

## 2018-04-06 NOTE — ED Notes (Signed)
Linen and pure wick changed.

## 2018-04-06 NOTE — ED Notes (Signed)
Checked patient cbg it was 100 notified RN of blood sugar

## 2018-04-06 NOTE — Progress Notes (Deleted)
CARDIOLOGY OFFICE NOTE  Date:  04/06/2018    [name] Date of Birth: [birth year] Medical Record #[id number]  PCP:  Lajean Manes, MD  Cardiologist:  Servando Snare & ***    No chief complaint on file.   History of Present Illness: MALIAKA BRASINGTON is a 84 y.o. female who presents today for a ***  Seen for Dr. Lovena Le. Former patient of Dr. Susa Simmonds. I previously took care of her husband [name] for many years.   She has a history of paroxysmal atrial fibrillation, prior stroke, dyslipidemia, hypertension, and diastolic heart failure. The patient has rheumatoid arthritis. Her atrial arrhythmias have beenwell-controlled. She was previously on chronic anticoagulation - stopped in 2018 due to anemia.   Last seen by Dr. Lovena Le back in May of 2016 - felt to be doing ok from our standpoint.  I saw her back September of 2016 - lots more swelling in her legs - worse after being switched over to Verapamil. Good EF by echo.   She was hospitalized back in July of 2017 with acute on chronic diastolic HF. HR initially elevated but improved with metoprolol. She was in atrial fib - looks to me like this was probably the trigger for her demise. Looks like itwas assumed she hadchronic AF - however she was in NSR on EKG last year. Now on Xarelto for her anticoagulation. Metoprolol dose was increased along with her Lasix.   I saw her back for follow up - I felt like her AF was the probable trigger for recent exacerbation. Discussed with Dr. Lovena Le and we elected to place her on amiodarone therapy - low dose. She has done ok - we have been able to cut her beta blocker back due to bradycardia.   I saw her back in November of 2017 after endorsing more chest pain. We opted to continue with medical management.Metoprolol was stopped for bradycardia.   Last seen back in June - was doing ok but dealing with a wound on her lower left leg - ended up finding out that she was anemic - stopped her anticoagulation and  got her back to see Dr. Felipa Eth.   Comes in today. Here alone.She notes she "had a near death experience" before coming here today. She got choked on water - this made her pretty breathless. This has happened a few times. She feels like this is because of "excessive" nasal drainage. Her leg finally healed up. She is back on Xarelto. No active bleeding noted. No real reason as to why she got anemic. She is tolerating her medicines. No falls. Still pretty independent.    Comes in today. Here with   Past Medical History:  Diagnosis Date  . Arthritis    severe  . Atrial fibrillation ([place])   . Chronic diastolic CHF (congestive heart failure) ([place])   . Chronic insomnia   . Degenerative arthritis   . Essential hypertension   . Hypercholesteremia   . Memory deficit 05/24/2013  . Osteoporosis   . PAF (paroxysmal atrial fibrillation) ([place])    a. Intolerant to amiodarone, discontinued October 2012.  Marland Kitchen RLS (restless legs syndrome) 08/11/2016  . Sinus bradycardia   . Stroke [facility]) 2007   mini stroke or TIA with aphasia     Past Surgical History:  Procedure Laterality Date  . CATARACT EXTRACTION  2009   ou  . FEMUR FRACTURE SURGERY  8/12  . left knee  2001  . REPLACEMENT TOTAL KNEE BILATERAL Right 1999  .  right intertrochanteric hip fracture status post ORIF  August 2012  . right shoulder replacement  2009     Medications: No outpatient medications have been marked as taking for the 04/06/18 encounter (Appointment) with Burtis Junes, NP.     Allergies: Allergies  Allergen Reactions  . Codeine Nausea Only    Social History: The patient  reports that she has never smoked. She has never used smokeless tobacco. She reports that she drinks about 4.2 oz of alcohol per week. She reports that she does not use drugs.   Family History: The patient's ***family history includes Cancer in her father, sister, sister, and son; Cancer - Colon in her father; Coronary artery disease in her  unknown relative; Dementia in her mother; Stroke in her mother.   Review of Systems: Please see the history of present illness.   Otherwise, the review of systems is positive for {NONE DEFAULTED:18576::"none"}.   All other systems are reviewed and negative.   Physical Exam: VS:  There were no vitals taken for this visit. Marland Kitchen  BMI There is no height or weight on file to calculate BMI.  Wt Readings from Last 3 Encounters:  04/06/18 118 lb (53.5 kg)  02/10/18 122 lb (55.3 kg)  10/13/17 122 lb 12.8 oz (55.7 kg)    General: Pleasant. Well developed, well nourished and in no acute distress.   HEENT: Normal.  Neck: Supple, no JVD, carotid bruits, or masses noted.  Cardiac: ***Regular rate and rhythm. No murmurs, rubs, or gallops. No edema.  Respiratory:  Lungs are clear to auscultation bilaterally with normal work of breathing.  GI: Soft and nontender.  MS: No deformity or atrophy. Gait and ROM intact.  Skin: Warm and dry. Color is normal.  Neuro:  Strength and sensation are intact and no gross focal deficits noted.  Psych: Alert, appropriate and with normal affect.   LABORATORY DATA:  EKG:  EKG {ACTION; IS/IS CXK:[id number]} ordered today. This demonstrates ***.  Lab Results  Component Value Date   WBC 8.1 04/06/2018   HGB 11.3 (L) 04/06/2018   HCT 34.7 (L) 04/06/2018   PLT 170 04/06/2018   GLUCOSE 150 (H) 04/06/2018   ALT 15 10/13/2017   AST 24 10/13/2017   NA 128 (L) 04/06/2018   K 3.6 04/06/2018   CL 89 (L) 04/06/2018   CREATININE 0.89 04/06/2018   BUN 22 (H) 04/06/2018   CO2 27 04/06/2018   TSH 0.784 10/13/2017   INR 1.26 05/26/2016     BNP (last 3 results) Recent Labs    04/06/18 0240  BNP 388.3*    ProBNP (last 3 results) No results for input(s): PROBNP in the last 8760 hours.   Other Studies Reviewed Today:   Assessment/Plan:  Echo Study Conclusions from 03/2015  - Left ventricle: The cavity size was normal. Wall thickness was increased in a pattern  of mild LVH. Systolic function was normal. The estimated ejection fraction was in the range of 55% to 60%. Doppler parameters are consistent with abnormal left ventricular relaxation (grade 1 diastolic dysfunction). - Aortic valve: There was mild regurgitation. - Mitral valve: Calcified annulus. Mildly thickened leaflets . There was mild regurgitation. - Atrial septum: No defect or patent foramen ovale was identified. - Pulmonary arteries: PA peak pressure: 35 mm Hg (S).   Lower Extremity Duplex Summary: No evidence of deep vein thrombosis involving the right common femoral vein and left lower extremity.  Other specific details can be found in the table(s) above. Prepared and  Electronically Authenticated by  Gae Gallop MD 2016-09-02T14:05:48   Assessment/Plan:  1. Complains of hair loss -not discussed today.   2. Chest pain -currently not an issue - would favor continued conservative management.  3. Chronic diastolic HF - with pastexacerbation thatI suspected wasdue to recurrent AF with RVR. EKG today shows she remains in NSR. Only on amiodarone back to just 100 mg a day andno longer on her Metoprolol.   4. PAF - back in NSR by exam and by EKG today. HR has improved. Needs surveillance labs today.   5. Chronic anticoagulation - no problems noted. She is back on Xarelto. Needs labs today.   6. CKD   7. Advanced age.She still does surprisingly well.   8. Nasal drainage - ok to use Claritin or Flonase. I suspect her choking was related to excessive nasal drainage. Encouraged her to use her lifeline to get help when needed.   Current medicines are reviewed with the patient today.  The patient does not have concerns regarding medicines other than what has been noted above.  The following changes have been made:  See above.  Labs/ tests ordered today include:   No orders of the defined types were placed in this encounter.    Disposition:    FU with *** in {gen number [id number]} {Days to years:10300}.   Patient is agreeable to this plan and will call if any problems develop in the interim.   SignedTruitt Merle, NP  04/06/2018 7:58 AM  [facility] [address] [place], [place]  [address] Phone: [phone] Fax: [phone]

## 2018-04-06 NOTE — Telephone Encounter (Signed)
Pt calling in today due to admission in the hospital.  Pt called EMS last night and want to make [facility] aware.  Lori s/w pt, will send to [facility].

## 2018-04-06 NOTE — ED Notes (Signed)
Phlebotomy contacted to draw troponin

## 2018-04-06 NOTE — Progress Notes (Signed)
  Echocardiogram 2D Echocardiogram has been performed.  [name] [name] 04/06/2018, 1:58 PM

## 2018-04-06 NOTE — Consult Note (Signed)
[facility] Nurse wound consult note Reason for Consult: Right anterior LE full thickness wound. Patient is followed once weekly at the outpatient [facility] (on Wednesdays) and is seen on Mondays and Fridays by a [facility] from [place] at [facility] for topical wound care.  The wound is healing and she requires light compression.  Wound type: Venous insufficiency with mild traumatic wound that is healing Pressure Injury POA: N/A Measurement: 3.2cm x 0.4cm x 0.1cm with dry red wound bed. Depth is partially obscured by the presence of dried serum (scab). There is no drainage. Wound bed: As described above Drainage (amount, consistency, odor) As described above. Periwound: intact, dry Dressing procedure/placement/frequency: Our formulary is not identical to the outpatient [facility], but we will implement a similar POC. We will use xeroform as our nonadherent contact layer and top with a dry gauze dressing.  Wrapping from toe to knee will be with Kerlix roll gauze and topped with an ACE bandage.  We will change daily instead of QOD.  [place] nursing team will not follow, but will remain available to this patient, the nursing and medical teams.  Please re-consult if needed. Thanks, Maudie Flakes, MSN, RN, [place], Arther Abbott  Pager# [phone]

## 2018-04-06 NOTE — ED Notes (Signed)
Pt c/o nausea. Pt declines breakfast due to same.

## 2018-04-06 NOTE — Consult Note (Addendum)
Cardiology Consultation:   Patient ID: [name]; [id number]; [birth year]   Admit date: 04/06/2018 Date of Consult: 04/06/2018  Primary Care Provider: Lajean Manes, MD Primary Cardiologist: Cristopher Peru, MD  Primary Electrophysiologist:     Patient Profile:   [name] is a 84 y.o. female with a hx of paroxysmal atrial fibrillation, HLD, HTN, chronic diastolic heart failure, and prior stroke who is being seen today for the evaluation of Afib and CHF at the request of Dr. Waldron Labs.  History of Present Illness:   [name] is followed by Dr. Lovena Le. She was last seen in clinic on 04/21/17 with Josefa Half. She was hospitalized 05/2016 with acute on chronic diastolic heart failure and Afib, rate controlled with lopressor and on chronic anticoagulation with xarelto. Her Afib was thought to be the trigger for her heart failure exacerbation. In follow up with Dr. Lovena Le, pt was started on amiodarone and beta blocker reduced. She was seen for chest pain 09/2016 and opted for medical therapy. She was in NSR on follow up 04/21/17. She has since D/C'ed her beta blocker and continued on amiodarone 100 mg daily.   She presents to [facility] with worsening shortness of breath. Patient states she was recently started on diltiazem (03/14/18). She states that since starting this medicine, she has had increased swelling in her legs and abdominal fullness. SOB and DOE have been worsening as well. She has not missed a dose of xarelto. She was recently seen in the ED 01/2018 for a leg laceration after a car door accident and received sutures. Xarelto was not interrupted.   On arrival, BNP was elevated to 388.3 and she was placed on O2 [place]. On my interview, patient states that she called Dr. Carlyle Lipa office for rapid heart rate and he prescribed cardizem. She started taking the cardizem, but it did not help her heart rate and she started having increased swelling in her legs. She also complains of urinary retention despite  being on her home lasix dose (20 mg BID). There are notes in the Epic that she also called [facility] for rapid heart rate. Per Dr. Lovena Le, she was to increase amiodarone to 200 mg BID (03/14/18 - increased amio to 200 mg BID x 7 days, then back to 200 mg daily).  However, she states she never got this message.   She reports having high heart rates for two weeks,  Not relieved by diltiazem. She has had corresponding leg swelling and SOB. Last evening, she suffered orthopnea when trying to sleep and had to call EMS.   She has been under increased stress over the past few days. She has had right knee pain (right knee replacement in 1999) and had to see her orthopedic doctor yesterday. She is also touring assisted living facilities and is trying to make a decision about moving so she can receive care.  Her first phone call to our office for rapid heart rate was 03/11/18.  Past Medical History:  Diagnosis Date  . Arthritis    severe  . Atrial fibrillation ([place])   . Chronic diastolic CHF (congestive heart failure) ([place])   . Chronic insomnia   . Degenerative arthritis   . Essential hypertension   . Hypercholesteremia   . Memory deficit 05/24/2013  . Osteoporosis   . PAF (paroxysmal atrial fibrillation) ([place])    a. Intolerant to amiodarone, discontinued October 2012.  Marland Kitchen RLS (restless legs syndrome) 08/11/2016  . Sinus bradycardia   . Stroke [facility]) 2007  mini stroke or TIA with aphasia     Past Surgical History:  Procedure Laterality Date  . CATARACT EXTRACTION  2009   ou  . FEMUR FRACTURE SURGERY  8/12  . left knee  2001  . REPLACEMENT TOTAL KNEE BILATERAL Right 1999  . right intertrochanteric hip fracture status post ORIF  August 2012  . right shoulder replacement  2009     Home Medications:  Prior to Admission medications   Medication Sig Start Date End Date Taking? Authorizing Provider  amiodarone (PACERONE) 100 MG tablet TAKE 1 TABLET(100 MG) BY MOUTH EVERY DAY 04/04/18  Yes  Burtis Junes, NP  Cholecalciferol (VITAMIN D-3 PO) Take 1 capsule by mouth daily.   Yes [provider]  Cyanocobalamin (VITAMIN B-12 PO) Take 1 tablet by mouth daily.   Yes [provider]  DILT-XR 120 MG 24 hr capsule Take 120 mg by mouth daily. 03/14/18  Yes [provider]  Eszopiclone 3 MG TABS TAKE 1 TABLET BY MOUTH IMMEDIATELY BEFORE BEFORE Patient taking differently: TAKE 1 TABLET BY MOUTH AT BEDTIME 03/29/18  Yes Kathrynn Ducking, MD  furosemide (LASIX) 40 MG tablet Take 0.5 tablets (20 mg total) by mouth 2 (two) times daily. May take an extra 20 mg prn weight gain of 2 pounds overnight. 12/21/17 04/06/18 Yes Burtis Junes, NP  Melatonin 3 MG TABS Take 10 mg by mouth at bedtime as needed (for sleep).    Yes [provider]  nitroGLYCERIN (NITROSTAT) 0.4 MG SL tablet DISSOLVE 1 TABLET UNDER THE TONGUE EVERY 5 MINUTES AS NEEDED FOR CHEST PAIN 03/14/18  Yes Burtis Junes, NP  RESTASIS 0.05 % ophthalmic emulsion Place 1 drop into both eyes 2 (two) times daily. 03/24/16  Yes [provider]  rivastigmine (EXELON) 4.5 MG capsule TAKE 1 CAPSULE(4.5 MG) BY MOUTH TWICE DAILY 10/28/17  Yes Kathrynn Ducking, MD  XARELTO 15 MG TABS tablet TAKE 1 TABLET(15 MG) BY MOUTH DAILY WITH SUPPER 01/25/18  Yes Evans Lance, MD    Inpatient Medications: Scheduled Meds: . amiodarone  100 mg Oral Daily  . cycloSPORINE  1 drop Both Eyes BID  . diltiazem  120 mg Oral Daily  . furosemide  20 mg Intravenous BID  . Rivaroxaban  15 mg Oral Q supper  . rivastigmine  4.5 mg Oral BID  . sodium chloride flush  3 mL Intravenous Q12H   Continuous Infusions: . sodium chloride     PRN Meds: sodium chloride, acetaminophen, Melatonin, nitroGLYCERIN, ondansetron (ZOFRAN) IV, sodium chloride flush, zolpidem  Allergies:    Allergies  Allergen Reactions  . Codeine Nausea Only    Social History:   Social History   Socioeconomic History  . Marital status: Widowed      Spouse name: Not on file  . Number of children: 2  . Years of education: MA  . Highest education level: Not on file  Occupational History  . Occupation: RETIRED  Social Needs  . Financial resource strain: Not on file  . Food insecurity:    Worry: Not on file    Inability: Not on file  . Transportation needs:    Medical: Not on file    Non-medical: Not on file  Tobacco Use  . Smoking status: Never Smoker  . Smokeless tobacco: Never Used  Substance and Sexual Activity  . Alcohol use: Yes    Alcohol/week: 4.2 oz    Types: 7 Glasses of wine per week    Comment: 1  glass wine with dinner some days  . Drug use: No  . Sexual activity: Not on file  Lifestyle  . Physical activity:    Days per week: Not on file    Minutes per session: Not on file  . Stress: Not on file  Relationships  . Social connections:    Talks on phone: Not on file    Gets together: Not on file    Attends religious service: Not on file    Active member of club or organization: Not on file    Attends meetings of clubs or organizations: Not on file    Relationship status: Not on file  . Intimate partner violence:    Fear of current or ex partner: Not on file    Emotionally abused: Not on file    Physically abused: Not on file    Forced sexual activity: Not on file  Other Topics Concern  . Not on file  Social History Narrative   Patient drinks 1 cup of coffee daily.   Patient is right handed.    Family History:    Family History  Problem Relation Age of Onset  . Dementia Mother   . Stroke Mother   . Cancer - Colon Father   . Cancer Father   . Cancer Sister   . Cancer Son   . Cancer Sister   . Coronary artery disease Unknown      ROS:  Please see the history of present illness.   All other ROS reviewed and negative.     Physical Exam/Data:   Vitals:   04/06/18 0845 04/06/18 0855 04/06/18 0951 04/06/18 1000  BP: 109/71 112/60 113/61 108/68  Pulse: (!) 126  (!) 134 (!) 128  Resp: 17   17 15   Temp:      TempSrc:      SpO2: 99%  99% 99%  Weight:      Height:       No intake or output data in the 24 hours ending 04/06/18 1032 Filed Weights   04/06/18 0238  Weight: 118 lb (53.5 kg)   Body mass index is 23.83 kg/m.  General:  Well nourished, well developed, in no acute distress HEENT: normal Neck: + JVD Vascular: No carotid bruits  Cardiac:  Irregular rhythm, tachycardic rate Lungs:  Respirations unlabored, on high flow O2, crackles in bases Abd: soft, nontender, no hepatomegaly  Ext: 1+ B LE edema, healing laceration on her right leg Musculoskeletal:  No deformities, BUE and BLE strength normal and equal Skin: warm and dry  Neuro:  CNs 2-12 intact, no focal abnormalities noted Psych:  Normal affect   EKG:  The EKG was personally reviewed and demonstrates:  Afib RVR with ventricular rates in the 120-130s Telemetry:  Telemetry was personally reviewed and demonstrates:  afib 110-130s  Relevant CV Studies:  Echo today pending  Echo 03/26/15: Study Conclusions - Left ventricle: The cavity size was normal. Wall thickness was   increased in a pattern of mild LVH. Systolic function was normal.   The estimated ejection fraction was in the range of 55% to 60%.   Doppler parameters are consistent with abnormal left ventricular   relaxation (grade 1 diastolic dysfunction). - Aortic valve: There was mild regurgitation. - Mitral valve: Calcified annulus. Mildly thickened leaflets .   There was mild regurgitation. - Atrial septum: No defect or patent foramen ovale was identified. - Pulmonary arteries: PA peak pressure: 35 mm Hg (S).  Laboratory Data:  Chemistry Recent  Labs  Lab 04/06/18 0240  NA 128*  K 3.6  CL 89*  CO2 27  GLUCOSE 150*  BUN 22*  CREATININE 0.89  CALCIUM 9.2  GFRNONAA 57*  GFRAA >60  ANIONGAP 12    No results for input(s): PROT, ALBUMIN, AST, ALT, ALKPHOS, BILITOT in the last 168 hours. Hematology Recent Labs  Lab 04/06/18 0240   WBC 8.1  RBC 3.80*  HGB 11.3*  HCT 34.7*  MCV 91.3  MCH 29.7  MCHC 32.6  RDW 13.0  PLT 170   Cardiac Enzymes Recent Labs  Lab 04/06/18 0520  TROPONINI 0.03*    Recent Labs  Lab 04/06/18 0251  TROPIPOC 0.02    BNP Recent Labs  Lab 04/06/18 0240  BNP 388.3*    DDimer No results for input(s): DDIMER in the last 168 hours.  Radiology/Studies:  Dg Chest Portable 1 View  Result Date: 04/06/2018 CLINICAL DATA:  Shortness of breath tonight. EXAM: PORTABLE CHEST 1 VIEW COMPARISON:  Frontal and lateral views 05/26/2016 FINDINGS: Mild cardiomegaly with aortic atherosclerosis. Moderate pulmonary edema with Kerley B-lines. Small bilateral pleural effusions. No confluent airspace disease. The previously questioned nodular density in the right lower lung zone is not currently visualized. No pneumothorax. Right proximal humeral arthroplasty. Chronic change about the left shoulder. Remote right rib fractures. IMPRESSION: Cardiomegaly with moderate pulmonary edema and small pleural effusions, consistent with CHF. Electronically Signed   By: Jeb Levering M.D.   On: 04/06/2018 03:13    Assessment and Plan:   1. Atrial fibrillation with RVR - EKG with Afib with rates in the 120-130s - telemetry with Afib 110-130s - K and Mg WNL, TSH pending - will D/C cardizem until echo is complete - will attempt rate control with lopressor 25 mg BID - will bolus with 150 mg amiodarone IV with conversion to drip - she has not missed doses of xarelto - CXR without infection, UA pending - if no infection identified, she may be a candidate for DCCV, which will likely help her heart failure symptoms - she denies bleeding problems - K 3.6, Mg 1.8, TSH pending  This patients CHA2DS2-VASc Score and unadjusted Ischemic Stroke Rate (% per year) is equal to 9.7 % stroke rate/year from a score of 6  (HTN, stroke, age, female).   Will keep her NPO at [facility] for possible DCCV tomorrow. No room on schedule as of  now, would be a possible add-on.   2. Acute on chronic diastolic heart failure - likely secondary to 2 weeks of RVR - will repeat an echo to make sure LVEF is still preserved - start lasix 40 mg IV daily after bladder scan confirms no urinary retention - home dose is 20 mg BID   3. Urinary retention - ordered bladder scan and UA pending - she reports several days of urinary retention despite lasix   4. HTN - controlled at home without medications   5. Elevated troponin - troponin mildly elevated and flat, not in a pattern consistent with ACS: 0.02 --> 0.03 - likely demand ischemia in the setting of congestive heart failure exacerbation and RVR    For questions or updates, please contact [facility] Please consult [email or web address] for contact info under Cardiology/STEMI.   Signed, Ledora Bottcher, [place]  04/06/2018 10:32 AM  Attending Note:   The patient was seen and examined.  Agree with assessment and plan as noted above.  Changes made to the above note as needed.  Patient seen and independently examined with  Doreene Adas, PA .   We discussed all aspects of the encounter. I agree with the assessment and plan as stated above.  1.  Paroxysmal atrial fibrillation: The patient presents with recurrent atrial fibrillation with rapid ventricular response.  She seems to have developed some worsening heart failure while in atrial fibrillation.  She was started on diltiazem by her primary medical doctor but it seemed to make her shortness of breath and leg swelling worse.  She has been on Xarelto and has not missed any doses. We should stop diltiazem.  We will get an echocardiogram.  We will rate control her using metoprolol.  Think she might benefit from an amiodarone bolus.  We will try to get her on for cardioversion tomorrow or the next day.  2.  Elevated troponin level: Her troponin pattern is not consistent with acute coronary syndrome.  I suspect it is from demand ischemia.   No further work-up is needed at this time.  3.  Acute on chronic diastolic congestive heart failure: I suspect her diastolic heart failure has been exacerbated by her rapid atrial fibrillation.  This should improve with better rate control.  Agree with additional Lasix for now.   I have spent a total of 40 minutes with patient reviewing hospital  notes , telemetry, EKGs, labs and examining patient as well as establishing an assessment and plan that was discussed with the patient. > 50% of time was spent in direct patient care.   Thayer Headings, Brooke Bonito., MD, [facility] 04/06/2018, 12:16 PM [address],  [place] Pager [phone]

## 2018-04-06 NOTE — ED Notes (Signed)
Called pharmacy to send cyclosporine

## 2018-04-07 LAB — BASIC METABOLIC PANEL
Anion gap: 10 (ref 5–15)
BUN: 23 mg/dL — ABNORMAL HIGH (ref 6–20)
CALCIUM: 9.5 mg/dL (ref 8.9–10.3)
CHLORIDE: 88 mmol/L — AB (ref 101–111)
CO2: 33 mmol/L — ABNORMAL HIGH (ref 22–32)
CREATININE: 1.04 mg/dL — AB (ref 0.44–1.00)
GFR calc non Af Amer: 47 mL/min — ABNORMAL LOW (ref >60)
GFR, EST AFRICAN AMERICAN: 54 mL/min — AB (ref >60)
Glucose, Bld: 105 mg/dL — ABNORMAL HIGH (ref 65–99)
Potassium: 4.5 mmol/L (ref 3.5–5.1)
Sodium: 131 mmol/L — ABNORMAL LOW (ref 135–145)

## 2018-04-07 LAB — CBC WITH DIFFERENTIAL/PLATELET
Abs Immature Granulocytes: 0 10*3/uL (ref 0.0–0.1)
BASOS PCT: 0 %
Basophils Absolute: 0 10*3/uL (ref 0.0–0.1)
EOS ABS: 0.1 10*3/uL (ref 0.0–0.7)
Eosinophils Relative: 2 %
HCT: 34.4 % — ABNORMAL LOW (ref 36.0–46.0)
Hemoglobin: 10.8 g/dL — ABNORMAL LOW (ref 12.0–15.0)
IMMATURE GRANULOCYTES: 1 %
Lymphocytes Relative: 11 %
Lymphs Abs: 0.7 10*3/uL (ref 0.7–4.0)
MCH: 30.1 pg (ref 26.0–34.0)
MCHC: 31.4 g/dL (ref 30.0–36.0)
MCV: 95.8 fL (ref 78.0–100.0)
Monocytes Absolute: 0.6 10*3/uL (ref 0.1–1.0)
Monocytes Relative: 10 %
NEUTROS PCT: 76 %
Neutro Abs: 4.5 10*3/uL (ref 1.7–7.7)
PLATELETS: 147 10*3/uL — AB (ref 150–400)
RBC: 3.59 MIL/uL — AB (ref 3.87–5.11)
RDW: 13.2 % (ref 11.5–15.5)
WBC: 6 10*3/uL (ref 4.0–10.5)

## 2018-04-07 MED ORDER — SODIUM CHLORIDE 0.9% FLUSH: 3.0000 mL | Freq: Two times a day (BID) | INTRAVENOUS | Status: DC

## 2018-04-07 MED ORDER — FUROSEMIDE 10 MG/ML IJ SOLN
40.0000 mg | Freq: Two times a day (BID) | INTRAMUSCULAR | Status: DC
  Administered 2018-04-07 – 2018-04-08 (×2): 40 mg via INTRAVENOUS
  Filled 2018-04-07 (×2): qty 4

## 2018-04-07 MED ORDER — SODIUM CHLORIDE 0.9 % IV SOLN: 250.0000 mL | INTRAVENOUS | Status: DC

## 2018-04-07 MED ORDER — SODIUM CHLORIDE 0.9% FLUSH: 3.0000 mL | INTRAVENOUS | Status: DC | PRN

## 2018-04-07 NOTE — Progress Notes (Signed)
Pt is scheduled for DCCV tomorrow 04/08/18 at 0930 with Dr. Harrington Challenger. NPO at [facility] please.

## 2018-04-07 NOTE — Evaluation (Signed)
Physical Therapy Evaluation Patient Details Name: [name] MRN: [id number] DOB: [birth year] Today's Date: 04/07/2018   History of Present Illness  84 y.o. female with medical history significant of chronic atrial fibrillation on chronic anticoagulation, chronic diastolic CHF, HTN, HLD, OA, history of CVA, BLE TKA, R Hip ORIF; who presents with complaints of progressively worsening shortness of breath, work-up significant for A. fib with RVR, and CHF.     Clinical Impression  Pt admitted with above diagnosis. Pt currently with functional limitations due to the deficits listed below (see PT Problem List). PTA, pt living at home alone, independent with mobility utilizing RW and driving. Pts family lives hour away, pt has some services to support with home upkeep. Upon eval pt presents with R Knee pain, mild imbalance, and activty intolerance. Pt could benefit from continued therapy to progress safety and conditioning. Patient had 1 LOB this visit in short distance ambulation hands on assistance to stabilize. Feel she presents as a fall risk at this time, although is hopeful to return home. Patient has arranged to move to ALF in a month, discussed with patient and family my concerns for the short term. Rec SNF based off current fall risk and lack of 24/7 support.   Pt will benefit from skilled PT to increase their independence and safety with mobility to allow discharge to the venue listed below.       Follow Up Recommendations SNF;Supervision/Assistance - 24 hour(pending progression to HHPT)    Equipment Recommendations  (TBD)    Recommendations for Other Services OT consult     Precautions / Restrictions Precautions Precautions: Fall Restrictions Weight Bearing Restrictions: No      Mobility  Bed Mobility Overal bed mobility: Modified Independent                Transfers Overall transfer level: Needs assistance Equipment used: Rolling walker (2 wheeled) Transfers: Sit to/from  Stand Sit to Stand: Min assist         General transfer comment: Min A to power up from standing, cues for hand placment, stabilizing RW   Ambulation/Gait Ambulation/Gait assistance: Min assist Ambulation Distance (Feet): 40 Feet Assistive device: Rolling walker (2 wheeled) Gait Pattern/deviations: Step-to pattern Gait velocity: decreased   General Gait Details: patient unsteady on feet, having LOB x1 requring mod A to stabilize. pt citing R knee pain during ambulation. sats well on 3L after 40 feet of gait.   Stairs            Wheelchair Mobility    Modified Rankin (Stroke Patients Only)       Balance Overall balance assessment: Needs assistance   Sitting balance-Leahy Scale: Fair       Standing balance-Leahy Scale: Poor Standing balance comment: RW for dynamic mobility                             Pertinent Vitals/Pain Pain Assessment: Faces Faces Pain Scale: Hurts even more Pain Location: R knee Pain Descriptors / Indicators: Aching;Discomfort Pain Intervention(s): Limited activity within patient's tolerance;Monitored during session;Repositioned    Home Living Family/patient expects to be discharged to:: Private residence Living Arrangements: Alone Available Help at Discharge: Family;Available PRN/intermittently Type of Home: House Home Access: Level entry     Home Layout: One level Home Equipment: Walker - 2 wheels      Prior Function Level of Independence: Needs assistance   Gait / Transfers Assistance Needed: community ambulation with RW  ADL's / Homemaking Assistance Needed: some assistance with home chores.         Hand Dominance        Extremity/Trunk Assessment   Upper Extremity Assessment Upper Extremity Assessment: Defer to OT evaluation    Lower Extremity Assessment Lower Extremity Assessment: Overall WFL for tasks assessed    Cervical / Trunk Assessment Cervical / Trunk Assessment: Normal  Communication    Communication: No difficulties  Cognition Arousal/Alertness: Awake/alert Behavior During Therapy: WFL for tasks assessed/performed Overall Cognitive Status: Within Functional Limits for tasks assessed                                 General Comments: patient is very sharp, talkative       General Comments      Exercises     Assessment/Plan    PT Assessment Patient needs continued PT services  PT Problem List Cardiopulmonary status limiting activity;Decreased range of motion;Decreased strength;Decreased activity tolerance;Decreased balance       PT Treatment Interventions DME instruction;Gait training;Stair training;Functional mobility training;Therapeutic activities;Therapeutic exercise    PT Goals (Current goals can be found in the Care Plan section)  Acute Rehab PT Goals Patient Stated Goal: go home  PT Goal Formulation: With patient Time For Goal Achievement: 04/21/18 Potential to Achieve Goals: Fair    Frequency Min 3X/week   Barriers to discharge Decreased caregiver support lives alone with only PRN support    Co-evaluation               AM-PAC PT "6 Clicks" Daily Activity  Outcome Measure Difficulty turning over in bed (including adjusting bedclothes, sheets and blankets)?: None Difficulty moving from lying on back to sitting on the side of the bed? : None Difficulty sitting down on and standing up from a chair with arms (e.g., wheelchair, bedside commode, etc,.)?: None Help needed moving to and from a bed to chair (including a wheelchair)?: A Little Help needed walking in hospital room?: A Little Help needed climbing 3-5 steps with a railing? : A Lot 6 Click Score: 20    End of Session Equipment Utilized During Treatment: Gait belt;Oxygen(3L) Activity Tolerance: Patient tolerated treatment well Patient left: in chair;with call bell/phone within reach;with family/visitor present;with nursing/sitter in room Nurse Communication: Mobility  status PT Visit Diagnosis: Unsteadiness on feet (R26.81)    Time: 1800-1830 PT Time Calculation (min) (ACUTE ONLY): 30 min   Charges:   PT Evaluation $PT Eval Low Complexity: 1 Low PT Treatments $Gait Training: 8-22 mins   PT G Codes:        Reinaldo Berber, PT, DPT Acute Rehab Services Pager: [phone]    Reinaldo Berber 04/07/2018, 9:12 PM

## 2018-04-07 NOTE — Progress Notes (Signed)
PROGRESS NOTE                                                                                                                                                                                                             Patient Demographics:    [name], is a 84 y.o. female, DOB - 29-Nov-1927, GGE:[id number]  Admit date - 04/06/2018   Admitting Physician Norval Morton, MD  Outpatient Primary MD for the patient is Lajean Manes, MD  LOS - 1   Chief Complaint  Patient presents with  . Shortness of Breath       Brief Narrative   84 y.o. female with medical history significant of chronic atrial fibrillation on chronic anticoagulation, chronic diastolic CHF, HTN, HLD, OA, history of CVA; who presents with complaints of progressively worsening shortness of breath, work-up significant for A. fib with RVR, and CHF.   Subjective:    [name] today has, No headache, No chest pain, No abdominal pain - No Nausea, she reports dyspnea, and right knee pain .   Assessment  & Plan :    Principal Problem:   Acute on chronic diastolic congestive heart failure (HCC) Active Problems:   Long term (current) use of anticoagulants   Essential hypertension   PAF (paroxysmal atrial fibrillation) (HCC)   Hyponatremia   Acute respiratory failure (HCC)   Normocytic normochromic anemia   Acute on chronic combined diastolic /systolic CHF  -Most recent echo 2016, with a preserved EF, grade 1 diastolic dysfunction, evidence of volume overload and imaging, as well elevated BNP, +2 edema bilaterally . -Repeat 2D echo showing dropped EF to 45%, with severe left atrial enlargement. -A. fib with RVR most likely the most contributing mechanism and her volume overload, plan for cardioversion tomorrow per cardiology -Her CHF most likely provoked by A. fib with RVR. -Cardiology consulted, continue with daily weight, strict ins and outs. -Lasix has been increased to 40 mg IV twice  daily   A. fib with RVR -Per patient she was recently started on diltiazem for better heart rate control, continue with home dose amiodarone, she is receiving IV amiodarone bolus, noted on beta-blockers, heart rate better controlled, plan for cardioversion tomorrow, hopefully this can improve her symptoms . -Continue with Xarelto for anticoagulation  Chest pain -  Acute.  Initial troponin negative.  Patient  reports having chest pain relieved with nitroglycerin -Following repeat echo  Hyponatremia -Secondary to volume overload, improving with IV diuresis  Right leg wound - Wound care consult  Essential hypertension - Continue medications as seen  above      Code Status : Full code  Family Communication  : None at bedside  Disposition Plan  : Home once stable  Consults  : Cardiology  Procedures  : None  DVT Prophylaxis  : On Xarelto  Lab Results  Component Value Date   PLT 147 (L) 04/07/2018    Antibiotics  :    Anti-infectives (From admission, onward)   None        Objective:   Vitals:   04/06/18 1934 04/07/18 0029 04/07/18 0439 04/07/18 0812  BP: 133/85 128/72 133/87 (!) 144/78  Pulse: 84 77 (!) 110 96  Resp: 16 18 18    Temp: 98 F (36.7 C) 98 F (36.7 C) 98 F (36.7 C)   TempSrc:  Oral Oral   SpO2: 97% 98% 98%   Weight:   53.2 kg (117 lb 4.6 oz)   Height:        Wt Readings from Last 3 Encounters:  04/07/18 53.2 kg (117 lb 4.6 oz)  02/10/18 55.3 kg (122 lb)  10/13/17 55.7 kg (122 lb 12.8 oz)     Intake/Output Summary (Last 24 hours) at 04/07/2018 1115 Last data filed at 04/07/2018 0848 Gross per 24 hour  Intake 1137.76 ml  Output 700 ml  Net 437.76 ml     Physical Exam  Awake Alert, Oriented X 3, No new F.N deficits, Normal affect, sitting in recliner comfortable Symmetrical Chest wall movement, Good air movement bilaterally, proved air entry at the bases Irregular irregular,No Gallops,Rubs or new Murmurs, No Parasternal Heave +ve  B.Sounds, Abd Soft, No tenderness,  No rebound - guarding or rigidity. No Cyanosis, Clubbing , No new Rash or bruise  , +2 edema B/L     Data Review:    CBC Recent Labs  Lab 04/06/18 0240 04/07/18 0414  WBC 8.1 6.0  HGB 11.3* 10.8*  HCT 34.7* 34.4*  PLT 170 147*  MCV 91.3 95.8  MCH 29.7 30.1  MCHC 32.6 31.4  RDW 13.0 13.2  LYMPHSABS  --  0.7  MONOABS  --  0.6  EOSABS  --  0.1  BASOSABS  --  0.0    Chemistries  Recent Labs  Lab 04/06/18 0240 04/06/18 0520 04/07/18 0414  NA 128*  --  131*  K 3.6  --  4.5  CL 89*  --  88*  CO2 27  --  33*  GLUCOSE 150*  --  105*  BUN 22*  --  23*  CREATININE 0.89  --  1.04*  CALCIUM 9.2  --  9.5  MG  --  1.8  --    ------------------------------------------------------------------------------------------------------------------ No results for input(s): CHOL, HDL, LDLCALC, TRIG, CHOLHDL, LDLDIRECT in the last 72 hours.  No results found for: HGBA1C ------------------------------------------------------------------------------------------------------------------ Recent Labs    04/06/18 1212  TSH 0.837   ------------------------------------------------------------------------------------------------------------------ No results for input(s): VITAMINB12, FOLATE, FERRITIN, TIBC, IRON, RETICCTPCT in the last 72 hours.  Coagulation profile No results for input(s): INR, PROTIME in the last 168 hours.  No results for input(s): DDIMER in the last 72 hours.  Cardiac Enzymes Recent Labs  Lab 04/06/18 0520 04/06/18 1212 04/06/18 1650  TROPONINI 0.03* 0.03* 0.03*   ------------------------------------------------------------------------------------------------------------------    Component Value Date/Time   BNP 388.3 (H) 04/06/2018 0240  BNP 322.5 (H) 06/29/2016 1606    Inpatient Medications  Scheduled Meds: . amiodarone  150 mg Intravenous Once  . cycloSPORINE  1 drop Both Eyes BID  . furosemide  40 mg Intravenous  BID  . metoprolol tartrate  25 mg Oral BID  . Rivaroxaban  15 mg Oral Q supper  . rivastigmine  4.5 mg Oral BID  . sodium chloride flush  3 mL Intravenous Q12H  . sodium chloride flush  3 mL Intravenous Q12H  . sodium chloride flush  3 mL Intravenous Q12H   Continuous Infusions: . sodium chloride    . sodium chloride    . sodium chloride    . amiodarone 30 mg/hr (04/07/18 0437)   PRN Meds:.sodium chloride, acetaminophen, Melatonin, nitroGLYCERIN, ondansetron (ZOFRAN) IV, sodium chloride flush, sodium chloride flush, sodium chloride flush, zolpidem  Micro Results No results found for this or any previous visit (from the past 240 hour(s)).  Radiology Reports Dg Chest Portable 1 View  Result Date: 04/06/2018 CLINICAL DATA:  Shortness of breath tonight. EXAM: PORTABLE CHEST 1 VIEW COMPARISON:  Frontal and lateral views 05/26/2016 FINDINGS: Mild cardiomegaly with aortic atherosclerosis. Moderate pulmonary edema with Kerley B-lines. Small bilateral pleural effusions. No confluent airspace disease. The previously questioned nodular density in the right lower lung zone is not currently visualized. No pneumothorax. Right proximal humeral arthroplasty. Chronic change about the left shoulder. Remote right rib fractures. IMPRESSION: Cardiomegaly with moderate pulmonary edema and small pleural effusions, consistent with CHF. Electronically Signed   By: Jeb Levering M.D.   On: 04/06/2018 03:13     Phillips Climes M.D on 04/07/2018 at 11:15 AM  Between 7am to 7pm - Pager - [phone]  After 7pm go to [email or web address] - password [facility]  [facility] -  Office  [phone]

## 2018-04-07 NOTE — Care Management Note (Addendum)
Case Management Note  Patient Details  Name: [name] MRN: [id number] Date of Birth: Aug 06, 1928  Subjective/Objective:  CHF                 Action/Plan: Patient lives at home alone; PCP: Lajean Manes, MD; has private insurance with Medicare Oncologist; [facility] choice [facility]; states that she is active with [facility] for [facility] services / wound care and goes to the [place] [place]; she continues to drive short distances and have a friend or [employer] assist in her errands; patient states that she cooks a heart healthy diet; CM will continue to follow for progression of care.  Expected Discharge Date:  04/11/18               Expected Discharge Plan:  [place]  In-House Referral:   [facility]  Discharge planning Services  CM Consult  Status of Service:   In progress  Royston Bake, RN.MHA,BSN [phone] 04/07/2018, 10:24 AM

## 2018-04-07 NOTE — Progress Notes (Addendum)
Progress Note  Patient Name: [name] Date of Encounter: 04/07/2018  Primary Cardiologist: Cristopher Peru, MD   Subjective   Breathing improved this AM but continues to be SOB. She is concerned that she will need to stay at least another day and have to have a cardioversion. She would like to go home and avoid the cardioversion if possible. She is unsure about orthopnea and PND as she has to sleep in a recliner 2/2 her bilateral shoulder pain.   Inpatient Medications    Scheduled Meds: . amiodarone  150 mg Intravenous Once  . cycloSPORINE  1 drop Both Eyes BID  . furosemide  20 mg Intravenous BID  . metoprolol tartrate  25 mg Oral BID  . Rivaroxaban  15 mg Oral Q supper  . rivastigmine  4.5 mg Oral BID  . sodium chloride flush  3 mL Intravenous Q12H  . sodium chloride flush  3 mL Intravenous Q12H   Continuous Infusions: . sodium chloride    . sodium chloride    . amiodarone 30 mg/hr (04/07/18 0437)   PRN Meds: sodium chloride, acetaminophen, Melatonin, nitroGLYCERIN, ondansetron (ZOFRAN) IV, sodium chloride flush, sodium chloride flush, zolpidem   Vital Signs    Vitals:   04/06/18 1934 04/07/18 0029 04/07/18 0439 04/07/18 0812  BP: 133/85 128/72 133/87 (!) 144/78  Pulse: 84 77 (!) 110 96  Resp: 16 18 18    Temp: 98 F (36.7 C) 98 F (36.7 C) 98 F (36.7 C)   TempSrc:  Oral Oral   SpO2: 97% 98% 98%   Weight:   117 lb 4.6 oz (53.2 kg)   Height:        Intake/Output Summary (Last 24 hours) at 04/07/2018 0844 Last data filed at 04/06/2018 2233 Gross per 24 hour  Intake 897.76 ml  Output 700 ml  Net 197.76 ml   Filed Weights   04/06/18 0238 04/07/18 0439  Weight: 118 lb (53.5 kg) 117 lb 4.6 oz (53.2 kg)   Telemetry    HR <110 over the interval. Periods of A-fib then periods with what looks like complete heart block - Personally Reviewed  ECG    No new EKG for review - Personally Reviewed  Physical Exam   General: Well nourished female in no acute  distress Pulm: Diminished air movement bilaterally. No obvious wheezing or crackles  CV: irreg Irreg.  Abdomen: Active bowel sounds, soft, non-distended, no tenderness to palpation  Extremities: Mild to moderate pitting edema bilaterally  Skin: Warm and dry  Neuro: Alert and oriented x 3  Labs    Chemistry Recent Labs  Lab 04/06/18 0240 04/07/18 0414  NA 128* 131*  K 3.6 4.5  CL 89* 88*  CO2 27 33*  GLUCOSE 150* 105*  BUN 22* 23*  CREATININE 0.89 1.04*  CALCIUM 9.2 9.5  GFRNONAA 57* 47*  GFRAA >60 54*  ANIONGAP 12 10    Hematology Recent Labs  Lab 04/06/18 0240 04/07/18 0414  WBC 8.1 6.0  RBC 3.80* 3.59*  HGB 11.3* 10.8*  HCT 34.7* 34.4*  MCV 91.3 95.8  MCH 29.7 30.1  MCHC 32.6 31.4  RDW 13.0 13.2  PLT 170 147*   Cardiac Enzymes Recent Labs  Lab 04/06/18 0520 04/06/18 1212 04/06/18 1650  TROPONINI 0.03* 0.03* 0.03*    Recent Labs  Lab 04/06/18 0251  TROPIPOC 0.02    BNP Recent Labs  Lab 04/06/18 0240  BNP 388.3*    DDimer No results for input(s): DDIMER in the  last 168 hours.   Radiology    Dg Chest Portable 1 View  Result Date: 04/06/2018 CLINICAL DATA:  Shortness of breath tonight. EXAM: PORTABLE CHEST 1 VIEW COMPARISON:  Frontal and lateral views 05/26/2016 FINDINGS: Mild cardiomegaly with aortic atherosclerosis. Moderate pulmonary edema with Kerley B-lines. Small bilateral pleural effusions. No confluent airspace disease. The previously questioned nodular density in the right lower lung zone is not currently visualized. No pneumothorax. Right proximal humeral arthroplasty. Chronic change about the left shoulder. Remote right rib fractures. IMPRESSION: Cardiomegaly with moderate pulmonary edema and small pleural effusions, consistent with CHF. Electronically Signed   By: Jeb Levering M.D.   On: 04/06/2018 03:13   Cardiac Studies   Echocardiogram 04/06/2018 Study Conclusions  - Left ventricle: The cavity size was normal. Wall thickness  was   increased in a pattern of mild LVH. Systolic function was mildly   to moderately reduced. The estimated ejection fraction was in the   range of 40% to 45%. Diffuse hypokinesis. The study is not   technically sufficient to allow evaluation of LV diastolic   function. - Aortic valve: Sclerosis without stenosis. There was trivial   regurgitation. - Mitral valve: Mildly thickened leaflets . There was mild   regurgitation. - Left atrium: Severely dilated. - Atrial septum: No defect or patent foramen ovale was identified. - Tricuspid valve: There was trivial regurgitation. - Pulmonary arteries: PA peak pressure: 28 mm Hg (S). - Inferior vena cava: The vessel was normal in size. The   respirophasic diameter changes were in the normal range (>= 50%),   consistent with normal central venous pressure.  Impressions:  - Compared to a prior study in 2016, the LVEF is lower at 40-45%   with global hypokinesis, trivial AI, mild MR and severe LAE.  Patient Profile     84 y.o. female who presented with progressive SOB, LE swelling, and abdominal distention. She was subsequently found to have A-fib with RVR.   Assessment & Plan    Atrial Fibrillation, Paroxysmal  - Telemetry reviewed illustrating rate controlled A-fib  - Continue Amiodarone and Lopressor  - Plan for DCCV on 04/08/18  - Continuing anticoagulation with Xarelto   HFrEF, Acute  - Echocardiogram showing LVEF is lower at 40-45% with global hypokinesis, trivial AI, mild MR and severe LAE. - While her IVC shows normal respiratory volume changes she does have bilateral LE edema and pulmonary congestion on CXR  - Renal function and potassium stable  - Would increase IV furosemide to 40 mg BID - Likely tachycardia induced cardiomyopathy but need ischemic evaluation   Demand Ischemia  - Troponin trend: 0.03 > 0.03 > 0.03  - Secondary to A-fib with RVR  For questions or updates, please contact [facility] Please consult  [email or web address] for contact info under Cardiology/STEMI.     Signed, Ina Homes, MD  04/07/2018, 8:44 AM    Attending Note:   The patient was seen and examined.  Agree with assessment and plan as noted above.  Changes made to the above note as needed.  Patient seen and independently examined with Ina Homes, MD.   We discussed all aspects of the encounter. I agree with the assessment and plan as stated above.  1.  Atrial fibrillation / atrial flutter: Patient appears to have an atypical atrial flutter on telemetry.  Her rate is somewhat better controlled.  Continue IV amiodarone for now.  She is scheduled for cardioversion tomorrow.  Continue anti-coagulation.  2.  Acute on chronic  combined systolic and diastolic congestive heart failure: She still has some leg edema.  We have increased the Lasix to 40 mg IV twice a day. Hopefully this heart failure will improve after cardioversion.      I have spent a total of 40 minutes with patient reviewing hospital  notes , telemetry, EKGs, labs and examining patient as well as establishing an assessment and plan that was discussed with the patient. > 50% of time was spent in direct patient care.    Thayer Headings, Brooke Bonito., MD, [facility] 04/07/2018, 9:58 AM [address]  [place] Pager [phone]

## 2018-04-08 ENCOUNTER — Encounter (HOSPITAL_COMMUNITY)
Admission: EM | Disposition: A | Discharge status: Skilled Nursing Facility | Payer: Self-pay | Source: Home / Self Care | Attending: Internal Medicine

## 2018-04-08 DIAGNOSIS — I484 Atypical atrial flutter: Secondary | ICD-10-CM

## 2018-04-08 DIAGNOSIS — I5043 Acute on chronic combined systolic (congestive) and diastolic (congestive) heart failure: Secondary | ICD-10-CM

## 2018-04-08 LAB — CBC WITH DIFFERENTIAL/PLATELET
Abs Immature Granulocytes: 0 10*3/uL (ref 0.0–0.1)
BASOS ABS: 0 10*3/uL (ref 0.0–0.1)
Basophils Relative: 0 %
EOS ABS: 0.2 10*3/uL (ref 0.0–0.7)
EOS PCT: 3 %
HCT: 33.8 % — ABNORMAL LOW (ref 36.0–46.0)
Hemoglobin: 10.7 g/dL — ABNORMAL LOW (ref 12.0–15.0)
Immature Granulocytes: 0 %
Lymphocytes Relative: 13 %
Lymphs Abs: 0.7 10*3/uL (ref 0.7–4.0)
MCH: 29.8 pg (ref 26.0–34.0)
MCHC: 31.7 g/dL (ref 30.0–36.0)
MCV: 94.2 fL (ref 78.0–100.0)
MONOS PCT: 12 %
Monocytes Absolute: 0.6 10*3/uL (ref 0.1–1.0)
NEUTROS ABS: 3.8 10*3/uL (ref 1.7–7.7)
Neutrophils Relative %: 72 %
PLATELETS: 146 10*3/uL — AB (ref 150–400)
RBC: 3.59 MIL/uL — ABNORMAL LOW (ref 3.87–5.11)
RDW: 13.2 % (ref 11.5–15.5)
WBC: 5.3 10*3/uL (ref 4.0–10.5)

## 2018-04-08 LAB — BASIC METABOLIC PANEL
ANION GAP: 9 (ref 5–15)
BUN: 25 mg/dL — AB (ref 6–20)
CALCIUM: 9.3 mg/dL (ref 8.9–10.3)
CO2: 35 mmol/L — AB (ref 22–32)
CREATININE: 1.17 mg/dL — AB (ref 0.44–1.00)
Chloride: 88 mmol/L — ABNORMAL LOW (ref 101–111)
GFR calc Af Amer: 47 mL/min — ABNORMAL LOW (ref >60)
GFR, EST NON AFRICAN AMERICAN: 41 mL/min — AB (ref >60)
GLUCOSE: 114 mg/dL — AB (ref 65–99)
Potassium: 3.9 mmol/L (ref 3.5–5.1)
Sodium: 132 mmol/L — ABNORMAL LOW (ref 135–145)

## 2018-04-08 LAB — MAGNESIUM: MAGNESIUM: 1.8 mg/dL (ref 1.7–2.4)

## 2018-04-08 SURGERY — CARDIOVERSION
Anesthesia: General

## 2018-04-08 MED ORDER — FUROSEMIDE 10 MG/ML IJ SOLN
80.0000 mg | Freq: Two times a day (BID) | INTRAMUSCULAR | Status: DC
  Administered 2018-04-08 – 2018-04-11 (×6): 80 mg via INTRAVENOUS
  Filled 2018-04-08 (×6): qty 8

## 2018-04-08 MED ORDER — FUROSEMIDE 10 MG/ML IJ SOLN: 60.0000 mg | Freq: Two times a day (BID) | INTRAMUSCULAR | Status: DC

## 2018-04-08 MED ORDER — METOPROLOL SUCCINATE ER 25 MG PO TB24
25.0000 mg | ORAL_TABLET | Freq: Every day | ORAL | Status: DC
  Administered 2018-04-08 – 2018-04-11 (×4): 25 mg via ORAL
  Filled 2018-04-08 (×4): qty 1

## 2018-04-08 MED ORDER — FUROSEMIDE 10 MG/ML IJ SOLN
40.0000 mg | Freq: Once | INTRAMUSCULAR | Status: AC
  Administered 2018-04-08: 40 mg via INTRAVENOUS
  Filled 2018-04-08: qty 4

## 2018-04-08 MED ORDER — MAGNESIUM SULFATE 2 GM/50ML IV SOLN
2.0000 g | Freq: Once | INTRAVENOUS | Status: AC
  Administered 2018-04-08: 2 g via INTRAVENOUS
  Filled 2018-04-08: qty 50

## 2018-04-08 MED ORDER — AMIODARONE HCL 200 MG PO TABS
200.0000 mg | ORAL_TABLET | Freq: Every day | ORAL | Status: DC
Start: 2018-04-08 — End: 2018-04-09
  Administered 2018-04-08: 200 mg via ORAL
  Filled 2018-04-08 (×2): qty 1

## 2018-04-08 MED ORDER — POTASSIUM CHLORIDE CRYS ER 20 MEQ PO TBCR
40.0000 meq | EXTENDED_RELEASE_TABLET | Freq: Once | ORAL | Status: AC
  Administered 2018-04-08: 40 meq via ORAL
  Filled 2018-04-08: qty 2

## 2018-04-08 NOTE — Progress Notes (Addendum)
Progress Note  Patient Name: [name] Date of Encounter: 04/08/2018  Primary Cardiologist: Cristopher Peru, MD , [name], NP   Subjective   Doing well this AM. She is happy that she is in sinus rhythm. Nervous to take off her oxygen because she is worried that she will not be able to breath. Okay with SNF if needed. She would like to talk to social work and PT.   Inpatient Medications    Scheduled Meds: . amiodarone  150 mg Intravenous Once  . cycloSPORINE  1 drop Both Eyes BID  . furosemide  40 mg Intravenous BID  . metoprolol tartrate  25 mg Oral BID  . Rivaroxaban  15 mg Oral Q supper  . rivastigmine  4.5 mg Oral BID  . sodium chloride flush  3 mL Intravenous Q12H   Continuous Infusions: . sodium chloride    . sodium chloride    . sodium chloride    . amiodarone 30 mg/hr (04/08/18 0135)   PRN Meds: sodium chloride, acetaminophen, Melatonin, nitroGLYCERIN, ondansetron (ZOFRAN) IV, sodium chloride flush, zolpidem   Vital Signs    Vitals:   04/08/18 0200 04/08/18 0437 04/08/18 0550 04/08/18 0855  BP: (!) 148/57  139/61 128/60  Pulse: (!) 55 (!) 56 (!) 49 77  Resp:   18   Temp:   98.5 F (36.9 C)   TempSrc:   Oral   SpO2: 100%  100%   Weight:   118 lb 6.4 oz (53.7 kg)   Height:        Intake/Output Summary (Last 24 hours) at 04/08/2018 0937 Last data filed at 04/08/2018 0700 Gross per 24 hour  Intake 1455.62 ml  Output 950 ml  Net 505.62 ml   Filed Weights   04/06/18 0238 04/07/18 0439 04/08/18 0550  Weight: 118 lb (53.5 kg) 117 lb 4.6 oz (53.2 kg) 118 lb 6.4 oz (53.7 kg)   Telemetry    NSR, converted on 6/6 around 1400 - Personally Reviewed  ECG    Sinus bradycardia with normal axis and borderline PR prolongation - Personally Reviewed  Physical Exam   General: Well nourished female in no acute distress Pulm: Diminished air movement bilaterally. No obvious wheezing or crackles  CV: RRR, no murmurs, no rus Abdomen: Active bowel sounds, soft,  non-distended, no tenderness to palpation  Extremities: Mild pitting edema bilaterally  Skin: Warm and dry  Neuro: Alert and oriented x 3  Labs    Chemistry Recent Labs  Lab 04/06/18 0240 04/07/18 0414 04/08/18 0657  NA 128* 131* 132*  K 3.6 4.5 3.9  CL 89* 88* 88*  CO2 27 33* 35*  GLUCOSE 150* 105* 114*  BUN 22* 23* 25*  CREATININE 0.89 1.04* 1.17*  CALCIUM 9.2 9.5 9.3  GFRNONAA 57* 47* 41*  GFRAA >60 54* 47*  ANIONGAP 12 10 9     Hematology Recent Labs  Lab 04/06/18 0240 04/07/18 0414 04/08/18 0657  WBC 8.1 6.0 5.3  RBC 3.80* 3.59* 3.59*  HGB 11.3* 10.8* 10.7*  HCT 34.7* 34.4* 33.8*  MCV 91.3 95.8 94.2  MCH 29.7 30.1 29.8  MCHC 32.6 31.4 31.7  RDW 13.0 13.2 13.2  PLT 170 147* 146*   Cardiac Enzymes Recent Labs  Lab 04/06/18 0520 04/06/18 1212 04/06/18 1650  TROPONINI 0.03* 0.03* 0.03*    Recent Labs  Lab 04/06/18 0251  TROPIPOC 0.02    BNP Recent Labs  Lab 04/06/18 0240  BNP 388.3*    DDimer No results for  input(s): DDIMER in the last 168 hours.   Radiology    No results found. Cardiac Studies   Echocardiogram 04/06/2018 Study Conclusions  - Left ventricle: The cavity size was normal. Wall thickness was   increased in a pattern of mild LVH. Systolic function was mildly   to moderately reduced. The estimated ejection fraction was in the   range of 40% to 45%. Diffuse hypokinesis. The study is not   technically sufficient to allow evaluation of LV diastolic   function. - Aortic valve: Sclerosis without stenosis. There was trivial   regurgitation. - Mitral valve: Mildly thickened leaflets . There was mild   regurgitation. - Left atrium: Severely dilated. - Atrial septum: No defect or patent foramen ovale was identified. - Tricuspid valve: There was trivial regurgitation. - Pulmonary arteries: PA peak pressure: 28 mm Hg (S). - Inferior vena cava: The vessel was normal in size. The   respirophasic diameter changes were in the normal  range (>= 50%),   consistent with normal central venous pressure.  Impressions:  - Compared to a prior study in 2016, the LVEF is lower at 40-45%   with global hypokinesis, trivial AI, mild MR and severe LAE.  Patient Profile     84 y.o. female who presented with progressive SOB, LE swelling, and abdominal distention. She was subsequently found to have A-fib with RVR.   Assessment & Plan    PAF / Atypical Atrial Flutter - Telemetry reviewed illustrating sinus bradycardia  - Plan for DCCV however, spontaneously converted to NSR this AM - Continue Amiodarone, but can switch to PO, will need 400 mg BID for about another week to complete load then transition to 200 mg BID - Continue Metoprolol 25 mg BID - Continuing anticoagulation with Xarelto   HFrEF, Acute  - Echocardiogram showing LVEF is lower at 40-45% with global hypokinesis, trivial AI, mild MR and severe LAE. - While her IVC shows normal respiratory volume changes she does have bilateral LE edema and pulmonary congestion on CXR  - Renal function slightly increased - Mg 1.8 will replete to maintain above 2.0  - Monitor potassium and replace as needed  - Better urine output with IV furosemide 40 mg last night, continue 40 mg BID. Strict I&O's - Likely tachycardia induced cardiomyopathy but need ischemic evaluation   Demand Ischemia  - Troponin trend: 0.03 > 0.03 > 0.03  - Secondary to A-fib with RVR  For questions or updates, please contact [place] Please consult [email or web address] for contact info under Cardiology/STEMI.     Signed, Ina Homes, MD  04/08/2018, 9:37 AM     Attending Note:   The patient was seen and examined.  Agree with assessment and plan as noted above.  Changes made to the above note as needed.  Patient seen and independently examined with Ina Homes, PGY-1.   We discussed all aspects of the encounter. I agree with the assessment and plan as stated above.  1.  Atrial  fibrillation/atrial flutter: [name] has converted back to normal sinus rhythm.  This is likely due to the increased amiodarone dose. We will increase her home amiodarone dose to 200 mg a day. She will follow-up with [name], NP  and Dr. Lovena Le for further management of her amiodarone. 2.  Acute on chronic combined systolic and diastolic congestive heart failure: Continue Lasix for now.  I suspect that her cardiac function will improve now that we have converted her back to normal sinus rhythm.  She might  need additional Lasix for the next 3 to 4 days but I suspect that her home dose of 40 mg a day will be sufficient in the long run. She is been on supplemental oxygen.    I have spent a total of 40 minutes with patient reviewing hospital  notes , telemetry, EKGs, labs and examining patient as well as establishing an assessment and plan that was discussed with the patient. > 50% of time was spent in direct patient care.    Thayer Headings, Brooke Bonito., MD, [facility] 04/08/2018, 12:05 PM [address],  [place] Pager [phone]

## 2018-04-08 NOTE — Care Management Important Message (Signed)
Important Message  Patient Details  Name: [name] MRN: [id number] Date of Birth: Jul 01, 1928   Medicare Important Message Given:  Yes    Terriah Reggio P Eddrick Dilone 04/08/2018, 3:28 PM

## 2018-04-08 NOTE — Clinical Social Work Note (Signed)
Clinical Social Work Assessment  Patient Details  Name: [name] MRN: [id number] Date of Birth: April 24, 1928  Date of referral:  04/08/18               Reason for consult:  Facility Placement, Discharge Planning                Permission sought to share information with:  Facility Sport and exercise psychologist, Family Supports Permission granted to share information::  Yes, Verbal Permission Granted  Name::     [name]  Agency::  SNF's  Relationship::  Son  Contact Information:  [phone]   Housing/Transportation Living arrangements for the past 2 months:  [place] of Information:  Patient, Medical Team, Adult Children Patient Interpreter Needed:  None Criminal Activity/Legal Involvement Pertinent to Current Situation/Hospitalization:  No - Comment as needed Significant Relationships:  Adult Children Lives with:  Self Do you feel safe going back to the place where you live?  Yes Need for family participation in patient care:  Yes (Comment)  Care giving concerns:  PT recommending SNF once medically stable for discharge.   Social Worker assessment / plan:  CSW met with patient. Son at bedside. CSW introduced role and explained that PT recommendations would be discussed. Patient had called [facility] prior to [place] meeting with her to check on bed availability. They are able to take her tomorrow. Patient also now considering [facility] but has never been there and does not know anything on the facility. Patient is focused on potentially needing long-term care after rehab. CSW encouraged patient to focus on rehab right now. [facility] is checking to see if they will have beds tomorrow. No further concerns. CSW encouraged patient and her son to contact CSW as needed. CSW will continue to follow patient and her son for support and facilitate discharge to SNF once medically stable.  Employment status:  Retired Forensic scientist:  Medicare PT Recommendations:   [place] / Referral to community resources:  [place]  Patient/Family's Response to care:  Patient and her son agreeable to SNF placement. Patient's son supportive and involved in patient's care. Patient and her son appreciated social work intervention.  Patient/Family's Understanding of and Emotional Response to Diagnosis, Current Treatment, and Prognosis:  Patient and her son have a good understanding of the reason for admission and plan for rehab in the short-term. Patient and her son appear happy with hospital care.  Emotional Assessment Appearance:  Appears stated age Attitude/Demeanor/Rapport:  Engaged, Gracious Affect (typically observed):  Accepting, Appropriate, Calm, Pleasant Orientation:  Oriented to Self, Oriented to Place, Oriented to  Time, Oriented to Situation Alcohol / Substance use:  Never Used Psych involvement (Current and /or in the community):  No (Comment)  Discharge Needs  Concerns to be addressed:  Care Coordination Readmission within the last 30 days:  No Current discharge risk:  Dependent with Mobility, Lives alone Barriers to Discharge:  Continued Medical Work up, [employer] inpatient day 2/3.)   Candie Chroman, LCSW 04/08/2018, 12:38 PM

## 2018-04-08 NOTE — Clinical Social Work Note (Signed)
Patient confirmed plan to discharge to [facility].  [place] Scrape, [place]

## 2018-04-08 NOTE — Progress Notes (Signed)
Patient still on NSR rate 60's.

## 2018-04-08 NOTE — Progress Notes (Signed)
PROGRESS NOTE                                                                                                                                                                                                             Patient Demographics:    [name], is a 84 y.o. female, DOB - 1928-04-06, PHX:[id number]  Admit date - 04/06/2018   Admitting Physician Norval Morton, MD  Outpatient Primary MD for the patient is Lajean Manes, MD  LOS - 2   Chief Complaint  Patient presents with  . Shortness of Breath       Brief Narrative   84 y.o. female with medical history significant of chronic atrial fibrillation on chronic anticoagulation, chronic diastolic CHF, HTN, HLD, OA, history of CVA; who presents with complaints of progressively worsening shortness of breath, work-up significant for A. fib with RVR, and CHF, improving on IV diuresis, plan was for DCCV, but she converted spontaneously to NSR.   Subjective:    [name] today has, No headache, No chest pain, No abdominal pain - No Nausea, reports she is feeling much better, [name] has improved.   Assessment  & Plan :    Principal Problem:   Acute on chronic diastolic congestive heart failure (HCC) Active Problems:   Long term (current) use of anticoagulants   Essential hypertension   PAF (paroxysmal atrial fibrillation) (HCC)   Hyponatremia   Acute respiratory failure (HCC)   Normocytic normochromic anemia   Acute on chronic combined diastolic /systolic CHF  -Most recent echo 2016, with a preserved EF, grade 1 diastolic dysfunction, evidence of volume overload and imaging, as well elevated BNP, +2 edema bilaterally . -Repeat 2D echo showing dropped EF to 45%, with severe left atrial enlargement. -A. fib with RVR most likely the most contributing mechanism and her volume overload, plan is for cardioversion this morning, but she spontaneously converted to NSR.  She is being loaded with IV  amiodarone, can be recent transition to p.o. on discharge. -Her CHF most likely provoked by A. fib with RVR. -Cardiology consulted, continue with daily weight, strict ins and outs. -He is currently on 40 mg IV twice daily Lasix, she received extra 40 mg IV Lasix overnight for [name] and crackles, she remains with positive fluid balance, and no change in her weight, I will increase her  Lasix to IV 80 twice daily.   A. fib with RVR -Per patient she was recently started on diltiazem for better heart rate control, continue with home dose amiodarone, she is receiving IV amiodarone bolus, she was started on beta-blockers as well, is still receiving her IV amiodarone bolus, likely will be transitioned to p.o. as an outpatient. -Plan for cardioversion today has been canceled as she converted spontaneously to normal sinus rhythm. -Continue with Xarelto for anticoagulation  Chest pain -  Acute.  Initial troponin negative.  Patient reports having chest pain relieved with nitroglycerin  Hyponatremia -Secondary to volume overload, proving with IV diuresis  Right leg wound - Wound care consult  Essential hypertension - Blood pressure acceptable, she was started on metoprolol   Code Status : Full code  Family Communication  : Son at bedside  Disposition Plan  : Need SNF placement  Consults  : Cardiology  Procedures  : None  DVT Prophylaxis  : On Xarelto  Lab Results  Component Value Date   PLT 146 (L) 04/08/2018    Antibiotics  :    Anti-infectives (From admission, onward)   None        Objective:   Vitals:   04/08/18 0200 04/08/18 0437 04/08/18 0550 04/08/18 0855  BP: (!) 148/57  139/61 128/60  Pulse: (!) 55 (!) 56 (!) 49 77  Resp:   18   Temp:   98.5 F (36.9 C)   TempSrc:   Oral   SpO2: 100%  100%   Weight:   53.7 kg (118 lb 6.4 oz)   Height:        Wt Readings from Last 3 Encounters:  04/08/18 53.7 kg (118 lb 6.4 oz)  02/10/18 55.3 kg (122 lb)  10/13/17 55.7  kg (122 lb 12.8 oz)     Intake/Output Summary (Last 24 hours) at 04/08/2018 1049 Last data filed at 04/08/2018 0938 Gross per 24 hour  Intake 1455.62 ml  Output 950 ml  Net 505.62 ml     Physical Exam  Awake Alert, Oriented X 3, No new F.N deficits, Normal affect Symmetrical Chest wall movement, Good air movement bilaterally, CTAB RRR,No Gallops,Rubs or new Murmurs, No Parasternal Heave +ve B.Sounds, Abd Soft, No tenderness,No rebound - guarding or rigidity. No Cyanosis, Clubbing No new Rash or bruise ,still  with significant edema, currently +2, but improving      Data Review:    CBC Recent Labs  Lab 04/06/18 0240 04/07/18 0414 04/08/18 0657  WBC 8.1 6.0 5.3  HGB 11.3* 10.8* 10.7*  HCT 34.7* 34.4* 33.8*  PLT 170 147* 146*  MCV 91.3 95.8 94.2  MCH 29.7 30.1 29.8  MCHC 32.6 31.4 31.7  RDW 13.0 13.2 13.2  LYMPHSABS  --  0.7 0.7  MONOABS  --  0.6 0.6  EOSABS  --  0.1 0.2  BASOSABS  --  0.0 0.0    Chemistries  Recent Labs  Lab 04/06/18 0240 04/06/18 0520 04/07/18 0414 04/08/18 0657  NA 128*  --  131* 132*  K 3.6  --  4.5 3.9  CL 89*  --  88* 88*  CO2 27  --  33* 35*  GLUCOSE 150*  --  105* 114*  BUN 22*  --  23* 25*  CREATININE 0.89  --  1.04* 1.17*  CALCIUM 9.2  --  9.5 9.3  MG  --  1.8  --  1.8   ------------------------------------------------------------------------------------------------------------------ No results for input(s): CHOL, HDL, LDLCALC, TRIG, CHOLHDL, LDLDIRECT  in the last 72 hours.  No results found for: HGBA1C ------------------------------------------------------------------------------------------------------------------ Recent Labs    04/06/18 1212  TSH 0.837   ------------------------------------------------------------------------------------------------------------------ No results for input(s): VITAMINB12, FOLATE, FERRITIN, TIBC, IRON, RETICCTPCT in the last 72 hours.  Coagulation profile No results for input(s): INR,  PROTIME in the last 168 hours.  No results for input(s): DDIMER in the last 72 hours.  Cardiac Enzymes Recent Labs  Lab 04/06/18 0520 04/06/18 1212 04/06/18 1650  TROPONINI 0.03* 0.03* 0.03*   ------------------------------------------------------------------------------------------------------------------    Component Value Date/Time   BNP 388.3 (H) 04/06/2018 0240   BNP 322.5 (H) 06/29/2016 1606    Inpatient Medications  Scheduled Meds: . amiodarone  150 mg Intravenous Once  . cycloSPORINE  1 drop Both Eyes BID  . furosemide  40 mg Intravenous BID  . metoprolol tartrate  25 mg Oral BID  . Rivaroxaban  15 mg Oral Q supper  . rivastigmine  4.5 mg Oral BID  . sodium chloride flush  3 mL Intravenous Q12H   Continuous Infusions: . sodium chloride    . sodium chloride    . sodium chloride    . amiodarone 30 mg/hr (04/08/18 0135)  . magnesium sulfate 1 - 4 g bolus IVPB     PRN Meds:.sodium chloride, acetaminophen, Melatonin, nitroGLYCERIN, ondansetron (ZOFRAN) IV, sodium chloride flush, zolpidem  Micro Results No results found for this or any previous visit (from the past 240 hour(s)).  Radiology Reports Dg Chest Portable 1 View  Result Date: 04/06/2018 CLINICAL DATA:  Shortness of breath tonight. EXAM: PORTABLE CHEST 1 VIEW COMPARISON:  Frontal and lateral views 05/26/2016 FINDINGS: Mild cardiomegaly with aortic atherosclerosis. Moderate pulmonary edema with Kerley B-lines. Small bilateral pleural effusions. No confluent airspace disease. The previously questioned nodular density in the right lower lung zone is not currently visualized. No pneumothorax. Right proximal humeral arthroplasty. Chronic change about the left shoulder. Remote right rib fractures. IMPRESSION: Cardiomegaly with moderate pulmonary edema and small pleural effusions, consistent with CHF. Electronically Signed   By: Jeb Levering M.D.   On: 04/06/2018 03:13     Phillips Climes M.D on 04/08/2018 at  10:49 AM  Between 7am to 7pm - Pager - [phone]  After 7pm go to [email or web address] - password [facility]  [facility] -  Office  [phone]

## 2018-04-08 NOTE — Progress Notes (Signed)
Physical Therapy Treatment Patient Details Name: [name] MRN: [id number] DOB: 08-24-1928 Today's Date: 04/08/2018    History of Present Illness 84 y.o. female with medical history significant of chronic atrial fibrillation on chronic anticoagulation, chronic diastolic CHF, HTN, HLD, OA, history of CVA, BLE TKA, R Hip ORIF; who presents with complaints of progressively worsening shortness of breath, work-up significant for A. fib with RVR, and CHF.    PT Comments    Patient with increased activity tolerance, min guard for all mobility this visit. Pt plans to d/c to SNF, this is appropriate to progress balance and safety with functional mobility. Pt still with c/o R knee pain at this time.   Follow Up Recommendations  SNF;Supervision/Assistance - 24 hour     Equipment Recommendations  (TBD next venue)    Recommendations for Other Services       Precautions / Restrictions Precautions Precautions: Fall Restrictions Weight Bearing Restrictions: No    Mobility  Bed Mobility Overal bed mobility: Modified Independent                Transfers Overall transfer level: Needs assistance Equipment used: Rolling walker (2 wheeled) Transfers: Sit to/from Stand Sit to Stand: Min assist         General transfer comment: min A to power up   Ambulation/Gait Ambulation/Gait assistance: Min guard Ambulation Distance (Feet): 50 Feet Assistive device: Rolling walker (2 wheeled) Gait Pattern/deviations: Step-to pattern Gait velocity: decreased   General Gait Details: Patient ambulating on 3L with improved balance, min guard for safety   Stairs             Wheelchair Mobility    Modified Rankin (Stroke Patients Only)       Balance Overall balance assessment: Needs assistance   Sitting balance-Leahy Scale: Good     Standing balance support: Single extremity supported;No upper extremity supported Standing balance-Leahy Scale: Poor Standing balance comment:  requires UE support                             Cognition Arousal/Alertness: Awake/alert Behavior During Therapy: WFL for tasks assessed/performed;Anxious Overall Cognitive Status: Within Functional Limits for tasks assessed                                 General Comments: patient is very sharp, talkative       Exercises      General Comments        Pertinent Vitals/Pain Pain Assessment: Faces Faces Pain Scale: Hurts little more Pain Location: R knee Pain Descriptors / Indicators: Aching;Discomfort Pain Intervention(s): Limited activity within patient's tolerance;Monitored during session;Repositioned    Home Living                      Prior Function            PT Goals (current goals can now be found in the care plan section) Acute Rehab PT Goals Patient Stated Goal: to get stronger and to get something to eat  PT Goal Formulation: With patient Time For Goal Achievement: 04/21/18 Potential to Achieve Goals: Fair Progress towards PT goals: Progressing toward goals    Frequency    Min 2X/week      PT Plan Current plan remains appropriate    Co-evaluation              AM-PAC PT "6  Clicks" Daily Activity  Outcome Measure  Difficulty turning over in bed (including adjusting bedclothes, sheets and blankets)?: None Difficulty moving from lying on back to sitting on the side of the bed? : None Difficulty sitting down on and standing up from a chair with arms (e.g., wheelchair, bedside commode, etc,.)?: None Help needed moving to and from a bed to chair (including a wheelchair)?: A Little Help needed walking in hospital room?: A Little Help needed climbing 3-5 steps with a railing? : A Lot 6 Click Score: 20    End of Session Equipment Utilized During Treatment: Gait belt;Oxygen(3L) Activity Tolerance: Patient tolerated treatment well Patient left: in chair;with call bell/phone within reach;with family/visitor  present;with nursing/sitter in room Nurse Communication: Mobility status PT Visit Diagnosis: Unsteadiness on feet (R26.81)     Time: 1800-1820 PT Time Calculation (min) (ACUTE ONLY): 20 min  Charges:  $Gait Training: 8-22 mins                    G Codes:       Reinaldo Berber, PT, DPT Acute Rehab Services Pager: [phone]     Reinaldo Berber 04/08/2018, 6:01 PM

## 2018-04-08 NOTE — Clinical Social Work Placement (Signed)
   CLINICAL SOCIAL WORK PLACEMENT  NOTE  Date:  04/08/2018  Patient Details  Name: [name] MRN: [id number] Date of Birth: 05-26-1928  Clinical Social Work is seeking post-discharge placement for this patient at the [place] level of care (*CSW will initial, date and re-position this form in  chart as items are completed):  Yes   Patient/family provided with [facility] Work Department's list of facilities offering this level of care within the geographic area requested by the patient (or if unable, by the patient's family).  Yes   Patient/family informed of their freedom to choose among providers that offer the needed level of care, that participate in Medicare, Medicaid or managed care program needed by the patient, have an available bed and are willing to accept the patient.  Yes   Patient/family informed of [facility]'s ownership interest in [facility] and [facility], as well as of the fact that they are under no obligation to receive care at these facilities.  PASRR submitted to EDS on 04/08/18     PASRR number received on       Existing PASRR number confirmed on 04/08/18     FL2 transmitted to all facilities in geographic area requested by pt/family on 04/08/18     FL2 transmitted to all facilities within larger geographic area on       Patient informed that his/her managed care company has contracts with or will negotiate with certain facilities, including the following:            Patient/family informed of bed offers received.  Patient chooses bed at       Physician recommends and patient chooses bed at      Patient to be transferred to   on  .  Patient to be transferred to facility by       Patient family notified on   of transfer.  Name of family member notified:        PHYSICIAN Please sign FL2     Additional Comment:    _______________________________________________ Candie Chroman, LCSW 04/08/2018, 12:42  PM

## 2018-04-08 NOTE — Evaluation (Signed)
Occupational Therapy Evaluation Patient Details Name: [name] MRN: [id number] DOB: [birth year] Today's Date: 04/08/2018    History of Present Illness 84 y.o. female with medical history significant of chronic atrial fibrillation on chronic anticoagulation, chronic diastolic CHF, HTN, HLD, OA, history of CVA, BLE TKA, R Hip ORIF; who presents with complaints of progressively worsening shortness of breath, work-up significant for A. fib with RVR, and CHF.   Clinical Impression   Pt admitted with above. She demonstrates the below listed deficits.  She currently requires min A for ADLs and functional mobility. She lives alone and was mod I PTA.  Feel she will benefit from SNF level rehab to allow her to maximize safety and independence with ADLs.  All further OT needs can be addressed by SNF OT.  Will sign off at this time.        Follow Up Recommendations  SNF;Supervision/Assistance - 24 hour    Equipment Recommendations  None recommended by OT    Recommendations for Other Services       Precautions / Restrictions Precautions Precautions: Fall Restrictions Weight Bearing Restrictions: No      Mobility Bed Mobility Overal bed mobility: Modified Independent                Transfers Overall transfer level: Needs assistance Equipment used: Rolling walker (2 wheeled) Transfers: Sit to/from Stand Sit to Stand: Min assist         General transfer comment: min A to steady     Balance Overall balance assessment: Needs assistance   Sitting balance-Leahy Scale: Good     Standing balance support: Single extremity supported;No upper extremity supported Standing balance-Leahy Scale: Poor Standing balance comment: requires UE support                            ADL either performed or assessed with clinical judgement   ADL Overall ADL's : Needs assistance/impaired Eating/Feeding: Independent   Grooming: Wash/dry hands;Wash/dry face;Oral care;Min  guard;Standing   Upper Body Bathing: Set up;Supervision/ safety;Sitting   Lower Body Bathing: Minimal assistance;Sit to/from stand   Upper Body Dressing : Set up;Sitting   Lower Body Dressing: Minimal assistance;Sit to/from stand   Toilet Transfer: Minimal assistance;Ambulation;Comfort height toilet;Grab bars;RW   Toileting- Clothing Manipulation and Hygiene: Minimal assistance;Sit to/from stand       Functional mobility during ADLs: Minimal assistance;Rolling walker General ADL Comments: DOE 2/4 on RA      Vision         Perception     Praxis      Pertinent Vitals/Pain Pain Assessment: Faces Faces Pain Scale: Hurts little more Pain Location: R knee Pain Descriptors / Indicators: Aching;Discomfort Pain Intervention(s): Monitored during session;Repositioned     Hand Dominance Right   Extremity/Trunk Assessment Upper Extremity Assessment Upper Extremity Assessment: RUE deficits/detail;LUE deficits/detail RUE Deficits / Details: arthritic deformity noted bil. hands  LUE Deficits / Details: arthritic deformity noted bil. hands    Lower Extremity Assessment Lower Extremity Assessment: Defer to PT evaluation   Cervical / Trunk Assessment Cervical / Trunk Assessment: Normal   Communication Communication Communication: No difficulties   Cognition Arousal/Alertness: Awake/alert Behavior During Therapy: WFL for tasks assessed/performed;Anxious Overall Cognitive Status: Within Functional Limits for tasks assessed                                     General  Comments  02 sats 87-90 on RA     Exercises     Shoulder Instructions      Home Living Family/patient expects to be discharged to:: Skilled nursing facility Living Arrangements: Alone Available Help at Discharge: Family;Available PRN/intermittently Type of Home: House Home Access: Level entry     Home Layout: One level     Bathroom Shower/Tub: Teacher, early years/pre:  Standard     Home Equipment: Environmental consultant - 2 wheels          Prior Functioning/Environment Level of Independence: Needs assistance  Gait / Transfers Assistance Needed: community ambulation with RW ADL's / Homemaking Assistance Needed: some assistance with home chores.             OT Problem List: Decreased strength;Decreased activity tolerance;Impaired balance (sitting and/or standing);Cardiopulmonary status limiting activity      OT Treatment/Interventions:      OT Goals(Current goals can be found in the care plan section) Acute Rehab OT Goals Patient Stated Goal: to get stronger and to get something to eat  OT Goal Formulation: All assessment and education complete, DC therapy  OT Frequency:     Barriers to D/C: Decreased caregiver support          Co-evaluation              AM-PAC PT "6 Clicks" Daily Activity     Outcome Measure Help from another person eating meals?: None Help from another person taking care of personal grooming?: A Little Help from another person toileting, which includes using toliet, bedpan, or urinal?: A Little Help from another person bathing (including washing, rinsing, drying)?: A Little Help from another person to put on and taking off regular upper body clothing?: A Little Help from another person to put on and taking off regular lower body clothing?: A Little 6 Click Score: 19   End of Session Equipment Utilized During Treatment: Rolling walker;Oxygen Nurse Communication: Mobility status  Activity Tolerance: Patient tolerated treatment well Patient left: in chair;with call bell/phone within reach;with chair alarm set;with family/visitor present  OT Visit Diagnosis: Unsteadiness on feet (R26.81)                Time: 8937-3428 OT Time Calculation (min): 37 min Charges:  OT General Charges $OT Visit: 1 Visit OT Evaluation $OT Eval Moderate Complexity: 1 Mod OT Treatments $Self Care/Home Management : 8-22 mins G-Codes:      [facility], OTR/L [phone]   Ginette Bradway M 04/08/2018, 2:30 PM

## 2018-04-08 NOTE — Progress Notes (Signed)
Pt scheduled for DCCV in AM. Pt converted to NSR/SB from A fib. EKG confirmed. MD paged. Pt also c/o SOB. Crackles auscultated bilaterally. O2 sat = 100% on 2L [place]. Orders received for Lasix 40mg  IV once. Will continue to monitor.

## 2018-04-08 NOTE — NC FL2 (Signed)
[place] LEVEL OF CARE SCREENING TOOL     IDENTIFICATION  Patient Name: [name] Birthdate: 1928/05/06 Sex: female Admission Date (Current Location): 04/06/2018  [facility] and [place] Number:  Herbalist and Address:  The . [facility] [place] [address] [place], [place] [address]      Provider Number: [id number]  Attending Physician Name and Address:  Elgergawy, Silver Huguenin, MD  Relative Name and Phone Number:       Current Level of Care: Hospital Recommended Level of Care: [place] Prior Approval Number:    Date Approved/Denied:   PASRR Number: [id number]  Discharge Plan: SNF    Current Diagnoses: Patient Active Problem List   Diagnosis Date Noted  . Atypical atrial flutter ([place])   . CHF (congestive heart failure) ([place]) 04/06/2018  . Hyponatremia 04/06/2018  . Acute respiratory failure ([place]) 04/06/2018  . Normocytic normochromic anemia 04/06/2018  . RLS (restless legs syndrome) 08/11/2016  . Demand ischemia ([place]) 05/27/2016  . Acute on chronic diastolic congestive heart failure ([place]) 05/26/2016  . Atrial fibrillation with RVR ([place]) 05/26/2016  . Insomnia 01/22/2016  . Sinus bradycardia   . Chronic diastolic CHF (congestive heart failure) ([place])   . Essential hypertension   . PAF (paroxysmal atrial fibrillation) ([place])   . Stroke ([place])   . Memory deficit 05/24/2013  . Arthritis 10/13/2011  . Long term (current) use of anticoagulants 07/21/2011  . Dyslipidemia 07/10/2011    Orientation RESPIRATION BLADDER Height & Weight     Self, Time, Situation, Place  O2(Nasal Canula 2 L) Incontinent, External catheter Weight: 118 lb 6.4 oz (53.7 kg) Height:  4\' 11"  (149.9 cm)  BEHAVIORAL SYMPTOMS/MOOD NEUROLOGICAL BOWEL NUTRITION STATUS  (None) (None) Continent Diet(Heart healthy)  AMBULATORY STATUS COMMUNICATION OF NEEDS Skin   Limited Assist Verbally Skin abrasions, Other (Comment)(Non-pressure wound on right  anterior leg: Compression wrap; gauze; impregnated gauze (Petrolatum))                       Personal Care Assistance Level of Assistance  Bathing, Feeding, Dressing Bathing Assistance: Limited assistance Feeding assistance: Independent Dressing Assistance: Limited assistance     Functional Limitations Info  Sight, Hearing, Speech Sight Info: Adequate Hearing Info: Adequate Speech Info: Adequate    SPECIAL CARE FACTORS FREQUENCY  PT (By licensed PT), Blood pressure, OT (By licensed OT)     PT Frequency: 5 x week OT Frequency: 5 x week            Contractures Contractures Info: Not present    Additional Factors Info  Code Status, Allergies Code Status Info: Full Allergies Info: Codeine           Current Medications (04/08/2018):  This is the current hospital active medication list Current Facility-Administered Medications  Medication Dose Route Frequency Provider Last Rate Last Dose  . 0.9 %  sodium chloride infusion  250 mL Intravenous PRN Smith, Rondell A, MD      . 0.9 %  sodium chloride infusion  250 mL Intravenous Continuous Duke, Tami Lin, PA      . 0.9 %  sodium chloride infusion  250 mL Intravenous Continuous Nahser, Wonda Cheng, MD      . acetaminophen (TYLENOL) tablet 650 mg  650 mg Oral Q4H PRN Smith, Rondell A, MD      . amiodarone (PACERONE) tablet 200 mg  200 mg Oral Daily Nahser, Wonda Cheng, MD      .  cycloSPORINE (RESTASIS) 0.05 % ophthalmic emulsion 1 drop  1 drop Both Eyes BID Fuller Plan A, MD   1 drop at 04/08/18 0853  . furosemide (LASIX) injection 80 mg  80 mg Intravenous BID Elgergawy, Silver Huguenin, MD      . magnesium sulfate IVPB 2 g 50 mL  2 g Intravenous Once Ina Homes, MD      . Melatonin TABS 9 mg  9 mg Oral QHS PRN Tamala Julian, Rondell A, MD      . metoprolol succinate (TOPROL-XL) 24 hr tablet 25 mg  25 mg Oral Daily Helberg, Justin, MD      . nitroGLYCERIN (NITROSTAT) SL tablet 0.4 mg  0.4 mg Sublingual Q5 min PRN Fuller Plan A, MD       . ondansetron (ZOFRAN) injection 4 mg  4 mg Intravenous Q6H PRN Fuller Plan A, MD   4 mg at 04/07/18 1247  . potassium chloride SA (K-DUR,KLOR-CON) CR tablet 40 mEq  40 mEq Oral Once Elgergawy, Silver Huguenin, MD      . Rivaroxaban (XARELTO) tablet 15 mg  15 mg Oral Q supper Fuller Plan A, MD   15 mg at 04/07/18 1728  . rivastigmine (EXELON) capsule 4.5 mg  4.5 mg Oral BID Fuller Plan A, MD   4.5 mg at 04/08/18 0853  . sodium chloride flush (NS) 0.9 % injection 3 mL  3 mL Intravenous Q12H Smith, Rondell A, MD   3 mL at 04/08/18 0854  . sodium chloride flush (NS) 0.9 % injection 3 mL  3 mL Intravenous PRN Smith, Rondell A, MD      . zolpidem (AMBIEN) tablet 5 mg  5 mg Oral QHS PRN Norval Morton, MD         Discharge Medications: Please see discharge summary for a list of discharge medications.  Relevant Imaging Results:  Relevant Lab Results:   Additional Information SS#: [id number]  Candie Chroman, LCSW

## 2018-04-09 DIAGNOSIS — J9600 Acute respiratory failure, unspecified whether with hypoxia or hypercapnia: Secondary | ICD-10-CM

## 2018-04-09 DIAGNOSIS — I10 Essential (primary) hypertension: Secondary | ICD-10-CM

## 2018-04-09 LAB — BASIC METABOLIC PANEL
ANION GAP: 7 (ref 5–15)
BUN: 26 mg/dL — ABNORMAL HIGH (ref 6–20)
CHLORIDE: 89 mmol/L — AB (ref 101–111)
CO2: 37 mmol/L — ABNORMAL HIGH (ref 22–32)
Calcium: 9.3 mg/dL (ref 8.9–10.3)
Creatinine, Ser: 0.96 mg/dL (ref 0.44–1.00)
GFR calc non Af Amer: 52 mL/min — ABNORMAL LOW (ref >60)
GFR, EST AFRICAN AMERICAN: 60 mL/min — AB (ref >60)
Glucose, Bld: 108 mg/dL — ABNORMAL HIGH (ref 65–99)
POTASSIUM: 3.6 mmol/L (ref 3.5–5.1)
SODIUM: 133 mmol/L — AB (ref 135–145)

## 2018-04-09 MED ORDER — AMIODARONE HCL IN DEXTROSE 360-4.14 MG/200ML-% IV SOLN
30.0000 mg/h | INTRAVENOUS | Status: DC
  Filled 2018-04-09 (×2): qty 200

## 2018-04-09 MED ORDER — AMIODARONE LOAD VIA INFUSION
150.0000 mg | Freq: Once | INTRAVENOUS | Status: AC
  Administered 2018-04-09: 150 mg via INTRAVENOUS
  Filled 2018-04-09: qty 83.34

## 2018-04-09 MED ORDER — POTASSIUM CHLORIDE CRYS ER 20 MEQ PO TBCR
40.0000 meq | EXTENDED_RELEASE_TABLET | Freq: Once | ORAL | Status: AC
  Administered 2018-04-09: 40 meq via ORAL
  Filled 2018-04-09: qty 2

## 2018-04-09 MED ORDER — METOLAZONE 2.5 MG PO TABS
2.5000 mg | ORAL_TABLET | Freq: Once | ORAL | Status: AC
  Administered 2018-04-09: 2.5 mg via ORAL
  Filled 2018-04-09: qty 1

## 2018-04-09 MED ORDER — AMIODARONE HCL IN DEXTROSE 360-4.14 MG/200ML-% IV SOLN
60.0000 mg/h | INTRAVENOUS | Status: AC
  Administered 2018-04-09: 60 mg/h via INTRAVENOUS
  Filled 2018-04-09 (×2): qty 200

## 2018-04-09 MED ORDER — POTASSIUM CHLORIDE CRYS ER 20 MEQ PO TBCR
40.0000 meq | EXTENDED_RELEASE_TABLET | Freq: Once | ORAL | Status: AC
  Administered 2018-04-09: 40 meq via ORAL

## 2018-04-09 NOTE — Progress Notes (Signed)
PROGRESS NOTE                                                                                                                                                                                                             Patient Demographics:    [name], is a 84 y.o. female, DOB - 20-Mar-1928, TKW:[id number]  Admit date - 04/06/2018   Admitting Physician Norval Morton, MD  Outpatient Primary MD for the patient is Lajean Manes, MD  LOS - 3   Chief Complaint  Patient presents with  . Shortness of Breath       Brief Narrative   84 y.o. female with medical history significant of chronic atrial fibrillation on chronic anticoagulation, chronic diastolic CHF, HTN, HLD, OA, history of CVA; who presents with complaints of progressively worsening shortness of breath, work-up significant for A. fib with RVR, and CHF, improving on IV diuresis, plan was for DCCV, but she converted spontaneously to NSR.   Subjective:    [name] today has, No headache, No chest pain, No abdominal pain - No Nausea, she does report some cough, unfortunately converted back to a flutter on 5 AM .   Assessment  & Plan :    Principal Problem:   Acute on chronic diastolic congestive heart failure (HCC) Active Problems:   Long term (current) use of anticoagulants   Essential hypertension   PAF (paroxysmal atrial fibrillation) (HCC)   Hyponatremia   Acute respiratory failure (HCC)   Normocytic normochromic anemia   Atypical atrial flutter (HCC)   Acute on chronic combined diastolic /systolic CHF  -Most recent echo 2016, with a preserved EF, grade 1 diastolic dysfunction, evidence of volume overload and imaging, as well elevated BNP, +2 edema bilaterally . -Repeat 2D echo showing dropped EF to 45%, with severe left atrial enlargement. -A. fib with RVR most likely the most contributing mechanism and her volume overload, plan is for cardioversion this morning, but she spontaneously  converted to NSR.  She is being loaded with IV amiodarone, can be recent transition to p.o. on discharge. -Her CHF most likely provoked by A. fib with RVR. -Cardiology consulted, continue with daily weight, strict ins and outs.  Quite aggressive IV diuresis and increasing Lasix to 80 mg IV twice daily, she remains with positive balance, weight went up from 53.7 to 55.6  kg, apparently she is not compliant with the fluid restriction, I have discussed with the patient, 1.5 L fluid restriction per day, she will receive an extra dose of metolazone today, continue with current dose IV Lasix,  A. fib with RVR -Patient was noted to be in A. fib with RVR on admission, this was most likely provoking her CHF, recently started on diltiazem by PCP, has been stopped given she is in acute CHF, and started on metoprolol instead . -She was loaded with amiodarone drip, she converted to normal sinus rhythm the morning before planned direct current cardioversion, which has been canceled, unfortunately she converted back to a flutter this morning, she is being loaded again with amiodarone, continue with metoprolol at current dose . -Continue with Xarelto for anticoagulation  Chest pain -  Acute.  Initial troponin negative.  Patient reports having chest pain relieved with nitroglycerin  Hyponatremia -Secondary to volume overload, improving with IV diuresisRight leg wound - Wound care consult  Essential hypertension - Blood pressure acceptable, she was started on metoprolol   Code Status : Full code  Family Communication  : Son at bedside  Disposition Plan  : Need SNF placement  Consults  : Cardiology  Procedures  : None  DVT Prophylaxis  : On Xarelto  Lab Results  Component Value Date   PLT 146 (L) 04/08/2018    Antibiotics  :    Anti-infectives (From admission, onward)   None        Objective:   Vitals:   04/08/18 1300 04/08/18 1312 04/08/18 1959 04/09/18 0606  BP:  126/90 (!) 116/51  140/82  Pulse: 66 (!) 53 (!) 50 92  Resp:  20 16 20   Temp:  98.2 F (36.8 C) 97.8 F (36.6 C) 98.1 F (36.7 C)  TempSrc:  Oral  Oral  SpO2:  99% 96% 100%  Weight:    55.6 kg (122 lb 9.2 oz)  Height:        Wt Readings from Last 3 Encounters:  04/09/18 55.6 kg (122 lb 9.2 oz)  02/10/18 55.3 kg (122 lb)  10/13/17 55.7 kg (122 lb 12.8 oz)     Intake/Output Summary (Last 24 hours) at 04/09/2018 1223 Last data filed at 04/09/2018 0916 Gross per 24 hour  Intake 480 ml  Output 350 ml  Net 130 ml     Physical Exam  Awake Alert, Oriented X 3, No new F.N deficits, Normal affect Good air entry bilaterally, has some fine bibasilar rales Irregular irregular,No Gallops,Rubs or new Murmurs, No Parasternal Heave +ve B.Sounds, Abd Soft, No tenderness,No rebound - guarding or rigidity. No Cyanosis, Clubbing No new Rash or bruise , right lower extremity wrapped secondary to wound, has +2-3 edema in lower extremities      Data Review:    CBC Recent Labs  Lab 04/06/18 0240 04/07/18 0414 04/08/18 0657  WBC 8.1 6.0 5.3  HGB 11.3* 10.8* 10.7*  HCT 34.7* 34.4* 33.8*  PLT 170 147* 146*  MCV 91.3 95.8 94.2  MCH 29.7 30.1 29.8  MCHC 32.6 31.4 31.7  RDW 13.0 13.2 13.2  LYMPHSABS  --  0.7 0.7  MONOABS  --  0.6 0.6  EOSABS  --  0.1 0.2  BASOSABS  --  0.0 0.0    Chemistries  Recent Labs  Lab 04/06/18 0240 04/06/18 0520 04/07/18 0414 04/08/18 0657 04/09/18 0640  NA 128*  --  131* 132* 133*  K 3.6  --  4.5 3.9 3.6  CL 89*  --  88* 88* 89*  CO2 27  --  33* 35* 37*  GLUCOSE 150*  --  105* 114* 108*  BUN 22*  --  23* 25* 26*  CREATININE 0.89  --  1.04* 1.17* 0.96  CALCIUM 9.2  --  9.5 9.3 9.3  MG  --  1.8  --  1.8  --    ------------------------------------------------------------------------------------------------------------------ No results for input(s): CHOL, HDL, LDLCALC, TRIG, CHOLHDL, LDLDIRECT in the last 72 hours.  No results found for:  HGBA1C ------------------------------------------------------------------------------------------------------------------ No results for input(s): TSH, T4TOTAL, T3FREE, THYROIDAB in the last 72 hours.  Invalid input(s): FREET3 ------------------------------------------------------------------------------------------------------------------ No results for input(s): VITAMINB12, FOLATE, FERRITIN, TIBC, IRON, RETICCTPCT in the last 72 hours.  Coagulation profile No results for input(s): INR, PROTIME in the last 168 hours.  No results for input(s): DDIMER in the last 72 hours.  Cardiac Enzymes Recent Labs  Lab 04/06/18 0520 04/06/18 1212 04/06/18 1650  TROPONINI 0.03* 0.03* 0.03*   ------------------------------------------------------------------------------------------------------------------    Component Value Date/Time   BNP 388.3 (H) 04/06/2018 0240   BNP 322.5 (H) 06/29/2016 1606    Inpatient Medications  Scheduled Meds: . cycloSPORINE  1 drop Both Eyes BID  . furosemide  80 mg Intravenous BID  . metoprolol succinate  25 mg Oral Daily  . potassium chloride  40 mEq Oral Once  . potassium chloride  40 mEq Oral Once  . Rivaroxaban  15 mg Oral Q supper  . rivastigmine  4.5 mg Oral BID  . sodium chloride flush  3 mL Intravenous Q12H   Continuous Infusions: . sodium chloride    . sodium chloride    . sodium chloride    . amiodarone 60 mg/hr (04/09/18 1125)   Followed by  . amiodarone     PRN Meds:.sodium chloride, acetaminophen, Melatonin, nitroGLYCERIN, ondansetron (ZOFRAN) IV, sodium chloride flush, zolpidem  Micro Results No results found for this or any previous visit (from the past 240 hour(s)).  Radiology Reports Dg Chest Portable 1 View  Result Date: 04/06/2018 CLINICAL DATA:  Shortness of breath tonight. EXAM: PORTABLE CHEST 1 VIEW COMPARISON:  Frontal and lateral views 05/26/2016 FINDINGS: Mild cardiomegaly with aortic atherosclerosis. Moderate pulmonary  edema with Kerley B-lines. Small bilateral pleural effusions. No confluent airspace disease. The previously questioned nodular density in the right lower lung zone is not currently visualized. No pneumothorax. Right proximal humeral arthroplasty. Chronic change about the left shoulder. Remote right rib fractures. IMPRESSION: Cardiomegaly with moderate pulmonary edema and small pleural effusions, consistent with CHF. Electronically Signed   By: Jeb Levering M.D.   On: 04/06/2018 03:13     Phillips Climes M.D on 04/09/2018 at 12:23 PM  Between 7am to 7pm - Pager - [phone]  After 7pm go to [email or web address] - password [facility]  [facility] -  Office  [phone]

## 2018-04-09 NOTE — Progress Notes (Signed)
Physical Therapy Treatment Patient Details Name: [name] MRN: [id number] DOB: 1928/07/09 Today's Date: 04/09/2018    History of Present Illness 83 y.o. female with medical history significant of chronic atrial fibrillation on chronic anticoagulation, chronic diastolic CHF, HTN, HLD, OA, history of CVA, BLE TKA, R Hip ORIF; who presents with complaints of progressively worsening shortness of breath, work-up significant for A. fib with RVR, and CHF.    PT Comments    Per RN pt requesting to mobilize safely with therapy, focused on gait training tonight, increased ambulation 50' with min guard and use of RW on 3L. Pt with continued c/o of R knee pain during gait. Patient will benefit from more frequent mobilization with mobility tech between PT visits while she awaits SNF placement when medically stable.    Follow Up Recommendations  SNF;Supervision/Assistance - 24 hour     Equipment Recommendations  (TBD next venue)    Recommendations for Other Services OT consult     Precautions / Restrictions Precautions Precautions: Fall Restrictions Weight Bearing Restrictions: No    Mobility  Bed Mobility               General bed mobility comments: In chair at entry  Transfers Overall transfer level: Needs assistance Equipment used: Rolling walker (2 wheeled) Transfers: Sit to/from Stand Sit to Stand: Min assist         General transfer comment: min A to power up   Ambulation/Gait Ambulation/Gait assistance: Min guard Ambulation Distance (Feet): 50 Feet Assistive device: Rolling walker (2 wheeled) Gait Pattern/deviations: Step-to pattern Gait velocity: decreased   General Gait Details: ambulating on 3L, unsteady, min guard for safety.    Stairs             Wheelchair Mobility    Modified Rankin (Stroke Patients Only)       Balance Overall balance assessment: Needs assistance   Sitting balance-Leahy Scale: Good     Standing balance support: Single  extremity supported;No upper extremity supported Standing balance-Leahy Scale: Poor Standing balance comment: requires UE support                             Cognition Arousal/Alertness: Awake/alert Behavior During Therapy: WFL for tasks assessed/performed;Anxious Overall Cognitive Status: Within Functional Limits for tasks assessed                                 General Comments: patient is very sharp, talkative, anxious      Exercises      General Comments        Pertinent Vitals/Pain Pain Assessment: Faces Faces Pain Scale: Hurts little more Pain Location: R knee Pain Descriptors / Indicators: Aching;Discomfort Pain Intervention(s): Limited activity within patient's tolerance;Monitored during session    Home Living                      Prior Function            PT Goals (current goals can now be found in the care plan section) Acute Rehab PT Goals Patient Stated Goal: to get stronger and to get something to eat  PT Goal Formulation: With patient Time For Goal Achievement: 04/21/18 Potential to Achieve Goals: Fair Progress towards PT goals: Progressing toward goals    Frequency    Min 2X/week      PT Plan Current plan remains appropriate  Co-evaluation              AM-PAC PT "6 Clicks" Daily Activity  Outcome Measure  Difficulty turning over in bed (including adjusting bedclothes, sheets and blankets)?: None Difficulty moving from lying on back to sitting on the side of the bed? : None Difficulty sitting down on and standing up from a chair with arms (e.g., wheelchair, bedside commode, etc,.)?: None Help needed moving to and from a bed to chair (including a wheelchair)?: A Little Help needed walking in hospital room?: A Little Help needed climbing 3-5 steps with a railing? : A Lot 6 Click Score: 20    End of Session Equipment Utilized During Treatment: Gait belt;Oxygen(3L) Activity Tolerance: Patient  tolerated treatment well Patient left: in chair;with call bell/phone within reach;with family/visitor present;with nursing/sitter in room Nurse Communication: Mobility status PT Visit Diagnosis: Unsteadiness on feet (R26.81)     Time: 1840-1900 PT Time Calculation (min) (ACUTE ONLY): 20 min  Charges:  $Gait Training: 8-22 mins                    G Codes:       Reinaldo Berber, PT, DPT Acute Rehab Services Pager: [phone]     Reinaldo Berber 04/09/2018, 7:00 PM

## 2018-04-09 NOTE — Social Work (Signed)
CSW continuing to follow for discharge to [facility].  Aware pt is not medically stable for transfer today, [place] also aware.  SNF will be able to accept pt tomorrow if discharge information is in (summary and orders) before noon.  If not/pt not stable for transfer, they will still be able to accept pt on Monday.   Alexander Mt, [place] Work [phone]

## 2018-04-09 NOTE — Progress Notes (Signed)
Progress Note  Patient Name: [name] Date of Encounter: 04/09/2018  Primary Cardiologist: Cristopher Peru, MD , [name], NP   Subjective   She is feeling worse today, breathing more labored and also feels pressure on her chest. She is back in a-flutter with RVR and weight is up 4 lbs since yesterday.   Inpatient Medications    Scheduled Meds: . amiodarone  200 mg Oral Daily  . cycloSPORINE  1 drop Both Eyes BID  . furosemide  80 mg Intravenous BID  . metoprolol succinate  25 mg Oral Daily  . potassium chloride  40 mEq Oral Once  . Rivaroxaban  15 mg Oral Q supper  . rivastigmine  4.5 mg Oral BID  . sodium chloride flush  3 mL Intravenous Q12H   Continuous Infusions: . sodium chloride    . sodium chloride    . sodium chloride     PRN Meds: sodium chloride, acetaminophen, Melatonin, nitroGLYCERIN, ondansetron (ZOFRAN) IV, sodium chloride flush, zolpidem   Vital Signs    Vitals:   04/08/18 1300 04/08/18 1312 04/08/18 1959 04/09/18 0606  BP:  126/90 (!) 116/51 140/82  Pulse: 66 (!) 53 (!) 50 92  Resp:  20 16 20   Temp:  98.2 F (36.8 C) 97.8 F (36.6 C) 98.1 F (36.7 C)  TempSrc:  Oral  Oral  SpO2:  99% 96% 100%  Weight:    122 lb 9.2 oz (55.6 kg)  Height:        Intake/Output Summary (Last 24 hours) at 04/09/2018 0942 Last data filed at 04/09/2018 1194 Gross per 24 hour  Intake 480 ml  Output 350 ml  Net 130 ml   Filed Weights   04/07/18 0439 04/08/18 0550 04/09/18 0606  Weight: 117 lb 4.6 oz (53.2 kg) 118 lb 6.4 oz (53.7 kg) 122 lb 9.2 oz (55.6 kg)   Telemetry    A-flutter, VR 110-120 BPM - Personally Reviewed  ECG    Sinus bradycardia with normal axis and borderline PR prolongation - Personally Reviewed  Physical Exam   General: Well nourished female in no acute distress Pulm: Diminished air movement bilaterally. No obvious wheezing or crackles  CV: RRR, no murmurs, no rus Abdomen: Active bowel sounds, soft, non-distended, no tenderness to  palpation  Extremities: Mild pitting edema bilaterally  Skin: Warm and dry  Neuro: Alert and oriented x 3  Labs    Chemistry Recent Labs  Lab 04/07/18 0414 04/08/18 0657 04/09/18 0640  NA 131* 132* 133*  K 4.5 3.9 3.6  CL 88* 88* 89*  CO2 33* 35* 37*  GLUCOSE 105* 114* 108*  BUN 23* 25* 26*  CREATININE 1.04* 1.17* 0.96  CALCIUM 9.5 9.3 9.3  GFRNONAA 47* 41* 52*  GFRAA 54* 47* 60*  ANIONGAP 10 9 7     Hematology Recent Labs  Lab 04/06/18 0240 04/07/18 0414 04/08/18 0657  WBC 8.1 6.0 5.3  RBC 3.80* 3.59* 3.59*  HGB 11.3* 10.8* 10.7*  HCT 34.7* 34.4* 33.8*  MCV 91.3 95.8 94.2  MCH 29.7 30.1 29.8  MCHC 32.6 31.4 31.7  RDW 13.0 13.2 13.2  PLT 170 147* 146*   Cardiac Enzymes Recent Labs  Lab 04/06/18 0520 04/06/18 1212 04/06/18 1650  TROPONINI 0.03* 0.03* 0.03*    Recent Labs  Lab 04/06/18 0251  TROPIPOC 0.02    BNP Recent Labs  Lab 04/06/18 0240  BNP 388.3*    DDimer No results for input(s): DDIMER in the last 168 hours.  Radiology    No results found. Cardiac Studies   Echocardiogram 04/06/2018 Study Conclusions  - Left ventricle: The cavity size was normal. Wall thickness was   increased in a pattern of mild LVH. Systolic function was mildly   to moderately reduced. The estimated ejection fraction was in the   range of 40% to 45%. Diffuse hypokinesis. The study is not   technically sufficient to allow evaluation of LV diastolic   function. - Aortic valve: Sclerosis without stenosis. There was trivial   regurgitation. - Mitral valve: Mildly thickened leaflets . There was mild   regurgitation. - Left atrium: Severely dilated. - Atrial septum: No defect or patent foramen ovale was identified. - Tricuspid valve: There was trivial regurgitation. - Pulmonary arteries: PA peak pressure: 28 mm Hg (S). - Inferior vena cava: The vessel was normal in size. The   respirophasic diameter changes were in the normal range (>= 50%),   consistent with  normal central venous pressure.  Impressions:  - Compared to a prior study in 2016, the LVEF is lower at 40-45%   with global hypokinesis, trivial AI, mild MR and severe LAE.  Patient Profile     84 y.o. female who presented with progressive SOB, LE swelling, and abdominal distention. She was subsequently found to have A-fib with RVR.   Assessment & Plan    PAF / Atypical Atrial Flutter - Telemetry reviewed, she went back to atrial flutter with variable block and RVR at 5 am this morning,  - for some reason she is only on amiodarone 200 mg po daily and was supposed to be on 400 mg po BID - Continue Amiodarone, I would restart iv - Continue Metoprolol 25 mg BID - Continuing anticoagulation with Xarelto   HFrEF, Acute  - Echocardiogram showing LVEF is lower at 40-45% with global hypokinesis, trivial AI, mild MR and severe LAE. - While her IVC shows normal respiratory volume changes she does have bilateral LE edema and pulmonary congestion on CXR  - Renal function stable - continue lasix 80 mg iv BID as weight up 4 lbs since yesterday - Likely tachycardia induced cardiomyopathy but need ischemic evaluation  -I will give her one of metolazone 2.5 mg po x1  Demand Ischemia  - Troponin trend: 0.03 > 0.03 > 0.03  - Secondary to A-fib with RVR  For questions or updates, please contact [facility] Please consult [email or web address] for contact info under Cardiology/STEMI.     Signed, Ena Dawley, MD  04/09/2018, 9:42 AM

## 2018-04-10 LAB — BASIC METABOLIC PANEL
Anion gap: 13 (ref 5–15)
BUN: 25 mg/dL — AB (ref 6–20)
CALCIUM: 9.4 mg/dL (ref 8.9–10.3)
CO2: 38 mmol/L — AB (ref 22–32)
CREATININE: 1.01 mg/dL — AB (ref 0.44–1.00)
Chloride: 86 mmol/L — ABNORMAL LOW (ref 101–111)
GFR calc Af Amer: 56 mL/min — ABNORMAL LOW (ref >60)
GFR calc non Af Amer: 49 mL/min — ABNORMAL LOW (ref >60)
GLUCOSE: 112 mg/dL — AB (ref 65–99)
Potassium: 4.1 mmol/L (ref 3.5–5.1)
Sodium: 137 mmol/L (ref 135–145)

## 2018-04-10 MED ORDER — AMIODARONE HCL 200 MG PO TABS: 200.0000 mg | ORAL_TABLET | Freq: Every day | ORAL | Status: DC

## 2018-04-10 MED ORDER — METOLAZONE 2.5 MG PO TABS
2.5000 mg | ORAL_TABLET | Freq: Once | ORAL | Status: AC
Start: 2018-04-10 — End: 2018-04-10
  Administered 2018-04-10: 2.5 mg via ORAL
  Filled 2018-04-10: qty 1

## 2018-04-10 MED ORDER — TRAMADOL HCL 50 MG PO TABS
100.0000 mg | ORAL_TABLET | Freq: Four times a day (QID) | ORAL | Status: DC | PRN
  Administered 2018-04-10 – 2018-04-11 (×4): 100 mg via ORAL
  Filled 2018-04-10 (×4): qty 2

## 2018-04-10 MED ORDER — POTASSIUM CHLORIDE CRYS ER 20 MEQ PO TBCR
30.0000 meq | EXTENDED_RELEASE_TABLET | ORAL | Status: AC
  Administered 2018-04-10 (×2): 30 meq via ORAL
  Filled 2018-04-10 (×2): qty 1

## 2018-04-10 MED ORDER — DICLOFENAC SODIUM 1 % TD GEL
4.0000 g | Freq: Four times a day (QID) | TRANSDERMAL | Status: DC | PRN
  Administered 2018-04-10 – 2018-04-11 (×4): 4 g via TOPICAL
  Filled 2018-04-10: qty 100

## 2018-04-10 MED ORDER — AMIODARONE HCL 200 MG PO TABS: 200.0000 mg | ORAL_TABLET | Freq: Two times a day (BID) | ORAL | Status: DC

## 2018-04-10 MED ORDER — ESZOPICLONE 1 MG PO TABS
3.0000 mg | ORAL_TABLET | Freq: Every day | ORAL | Status: DC
  Filled 2018-04-10: qty 3

## 2018-04-10 MED ORDER — AMIODARONE HCL 200 MG PO TABS
400.0000 mg | ORAL_TABLET | Freq: Two times a day (BID) | ORAL | Status: DC
  Administered 2018-04-10 – 2018-04-11 (×3): 400 mg via ORAL
  Filled 2018-04-10 (×3): qty 2

## 2018-04-10 NOTE — Progress Notes (Signed)
PROGRESS NOTE                                                                                                                                                                                                             Patient Demographics:    [name], is a 84 y.o. female, DOB - 06/19/1928, [id number]  Admit date - 04/06/2018   Admitting Physician Norval Morton, MD  Outpatient Primary MD for the patient is Lajean Manes, MD  LOS - 4   Chief Complaint  Patient presents with  . Shortness of Breath       Brief Narrative   84 y.o. female with medical history significant of chronic atrial fibrillation on chronic anticoagulation, chronic diastolic CHF, HTN, HLD, OA, history of CVA; who presents with complaints of progressively worsening shortness of breath, work-up significant for A. fib with RVR, and CHF, improving on IV diuresis, plan was for DCCV, but she converted spontaneously to NSR.   Subjective:    [name] today has, No headache, No chest pain, No abdominal pain - No Nausea, she converted back to normal sinus rhythm this morning.  Assessment  & Plan :    Principal Problem:   Acute on chronic diastolic congestive heart failure (HCC) Active Problems:   Long term (current) use of anticoagulants   Essential hypertension   PAF (paroxysmal atrial fibrillation) (HCC)   Hyponatremia   Acute respiratory failure (HCC)   Normocytic normochromic anemia   Atypical atrial flutter (HCC)   Acute on chronic combined diastolic /systolic CHF  -Most recent echo 2016, with a preserved EF, grade 1 diastolic dysfunction, evidence of volume overload and imaging, as well elevated BNP, +2 edema bilaterally . -Repeat 2D echo showing dropped EF to 45%, with severe left atrial enlargement. -A. fib with RVR most likely the most contributing mechanism and her volume overload, plan is for cardioversion this morning, but she spontaneously converted to NSR.  She is  being loaded with IV amiodarone, can be recent transition to p.o. on discharge. -Her CHF most likely provoked by A. fib with RVR. -Cardiology consulted, continue with daily weight, strict ins and outs.  Paresis has improved yesterday after receiving as needed metolazone, she is -3.3 L over last 24 hours, kidney with fluid restriction, continue with Lasix 80 mg IV twice daily, she will be  given another dose of metolazone today by cards as well .  A. fib with RVR -Patient was noted to be in A. fib with RVR on admission, this was most likely provoking her CHF, recently started on diltiazem by PCP, has been stopped given she is in acute CHF, and started on metoprolol instead . -She was loaded with amiodarone drip, she converted to normal sinus rhythm the morning before planned direct current cardioversion, which has been canceled, since then she is been back one time in A. fib with RVR, and she is being loaded again with amiodarone, fortunately she converted back to normal sinus rhythm, per cards recommendation, will continue with amiodarone load, then she will need over 400 mg twice daily for 5 days followed by 200 mg p.o. twice daily for 5 days then 200 mg daily thereafter . -continuewith metoprolol 25 mg twice daily -Continue with Xarelto for anticoagulation  Chest pain -  Acute.  Initial troponin negative.  Patient reports having chest pain relieved with nitroglycerin  Hyponatremia -Secondary to volume overload, improving with IV diuresis   Right leg wound - Wound care consult  Essential hypertension - Blood pressure acceptable, she was started on metoprolol  Left shoulder pain -resume Home Voltaren and Ultram   Code Status : Full code  Family Communication  : none at bedside  Disposition Plan  :  SNF  Consults  : Cardiology  Procedures  : None  DVT Prophylaxis  : On Xarelto  Lab Results  Component Value Date   PLT 146 (L) 04/08/2018    Antibiotics  :    Anti-infectives  (From admission, onward)   None        Objective:   Vitals:   04/09/18 1225 04/09/18 1957 04/10/18 0625 04/10/18 1147  BP: (!) 104/57 118/64 (!) 149/66 (!) 164/97  Pulse: 73 89 88 74  Resp: 20   20  Temp: 97.9 F (36.6 C) 98.5 F (36.9 C) 98.2 F (36.8 C) 98.1 F (36.7 C)  TempSrc: Oral Oral Oral Oral  SpO2: 98% 98% 95% 98%  Weight:   54.9 kg (121 lb 0.5 oz)   Height:        Wt Readings from Last 3 Encounters:  04/10/18 54.9 kg (121 lb 0.5 oz)  02/10/18 55.3 kg (122 lb)  10/13/17 55.7 kg (122 lb 12.8 oz)     Intake/Output Summary (Last 24 hours) at 04/10/2018 1227 Last data filed at 04/10/2018 1200 Gross per 24 hour  Intake 619.7 ml  Output 4550 ml  Net -3930.3 ml     Physical Exam  Awake Alert, Oriented X 3, No new F.N deficits, Normal affect Symmetrical Chest wall movement, Good air movement bilaterally, CTAB RRR,No Gallops,Rubs or new Murmurs, No Parasternal Heave +ve B.Sounds, Abd Soft, No tenderness, No organomegaly appriciated, No rebound - guarding or rigidity. No Cyanosis, Clubbing or edem, No new Rash or bruise , lower extremity edema has improved      Data Review:    CBC Recent Labs  Lab 04/06/18 0240 04/07/18 0414 04/08/18 0657  WBC 8.1 6.0 5.3  HGB 11.3* 10.8* 10.7*  HCT 34.7* 34.4* 33.8*  PLT 170 147* 146*  MCV 91.3 95.8 94.2  MCH 29.7 30.1 29.8  MCHC 32.6 31.4 31.7  RDW 13.0 13.2 13.2  LYMPHSABS  --  0.7 0.7  MONOABS  --  0.6 0.6  EOSABS  --  0.1 0.2  BASOSABS  --  0.0 0.0    Chemistries  Recent Labs  Lab 04/06/18 0240 04/06/18 0520 04/07/18 0414 04/08/18 0657 04/09/18 0640 04/10/18 0706  NA 128*  --  131* 132* 133* 137  K 3.6  --  4.5 3.9 3.6 4.1  CL 89*  --  88* 88* 89* 86*  CO2 27  --  33* 35* 37* 38*  GLUCOSE 150*  --  105* 114* 108* 112*  BUN 22*  --  23* 25* 26* 25*  CREATININE 0.89  --  1.04* 1.17* 0.96 1.01*  CALCIUM 9.2  --  9.5 9.3 9.3 9.4  MG  --  1.8  --  1.8  --   --     ------------------------------------------------------------------------------------------------------------------ No results for input(s): CHOL, HDL, LDLCALC, TRIG, CHOLHDL, LDLDIRECT in the last 72 hours.  No results found for: HGBA1C ------------------------------------------------------------------------------------------------------------------ No results for input(s): TSH, T4TOTAL, T3FREE, THYROIDAB in the last 72 hours.  Invalid input(s): FREET3 ------------------------------------------------------------------------------------------------------------------ No results for input(s): VITAMINB12, FOLATE, FERRITIN, TIBC, IRON, RETICCTPCT in the last 72 hours.  Coagulation profile No results for input(s): INR, PROTIME in the last 168 hours.  No results for input(s): DDIMER in the last 72 hours.  Cardiac Enzymes Recent Labs  Lab 04/06/18 0520 04/06/18 1212 04/06/18 1650  TROPONINI 0.03* 0.03* 0.03*   ------------------------------------------------------------------------------------------------------------------    Component Value Date/Time   BNP 388.3 (H) 04/06/2018 0240   BNP 322.5 (H) 06/29/2016 1606    Inpatient Medications  Scheduled Meds: . amiodarone  400 mg Oral BID   Followed by  . [START ON 04/15/2018] amiodarone  200 mg Oral BID   Followed by  . [START ON 04/20/2018] amiodarone  200 mg Oral Daily  . cycloSPORINE  1 drop Both Eyes BID  . furosemide  80 mg Intravenous BID  . metoprolol succinate  25 mg Oral Daily  . potassium chloride  30 mEq Oral Q4H  . Rivaroxaban  15 mg Oral Q supper  . rivastigmine  4.5 mg Oral BID  . sodium chloride flush  3 mL Intravenous Q12H   Continuous Infusions: . sodium chloride    . sodium chloride    . sodium chloride    . amiodarone Stopped (04/10/18 0958)   PRN Meds:.sodium chloride, acetaminophen, diclofenac sodium, Melatonin, nitroGLYCERIN, ondansetron (ZOFRAN) IV, sodium chloride flush, traMADol, zolpidem  Micro  Results No results found for this or any previous visit (from the past 240 hour(s)).  Radiology Reports Dg Chest Portable 1 View  Result Date: 04/06/2018 CLINICAL DATA:  Shortness of breath tonight. EXAM: PORTABLE CHEST 1 VIEW COMPARISON:  Frontal and lateral views 05/26/2016 FINDINGS: Mild cardiomegaly with aortic atherosclerosis. Moderate pulmonary edema with Kerley B-lines. Small bilateral pleural effusions. No confluent airspace disease. The previously questioned nodular density in the right lower lung zone is not currently visualized. No pneumothorax. Right proximal humeral arthroplasty. Chronic change about the left shoulder. Remote right rib fractures. IMPRESSION: Cardiomegaly with moderate pulmonary edema and small pleural effusions, consistent with CHF. Electronically Signed   By: Jeb Levering M.D.   On: 04/06/2018 03:13     Phillips Climes M.D on 04/10/2018 at 12:27 PM  Between 7am to 7pm - Pager - [phone]  After 7pm go to [email or web address] - password [facility]  [facility] -  Office  [phone]

## 2018-04-10 NOTE — Progress Notes (Signed)
Progress Note  Patient Name: [name] Date of Encounter: 04/10/2018  Primary Cardiologist: Cristopher Peru, MD , [name], NP   Subjective   She is complaining of left shoulder pain, breathing has improved.   Inpatient Medications    Scheduled Meds: . cycloSPORINE  1 drop Both Eyes BID  . furosemide  80 mg Intravenous BID  . metoprolol succinate  25 mg Oral Daily  . potassium chloride  30 mEq Oral Q4H  . Rivaroxaban  15 mg Oral Q supper  . rivastigmine  4.5 mg Oral BID  . sodium chloride flush  3 mL Intravenous Q12H   Continuous Infusions: . sodium chloride    . sodium chloride    . sodium chloride    . amiodarone 30 mg/hr (04/09/18 1738)   PRN Meds: sodium chloride, acetaminophen, diclofenac sodium, Melatonin, nitroGLYCERIN, ondansetron (ZOFRAN) IV, sodium chloride flush, traMADol, zolpidem   Vital Signs    Vitals:   04/09/18 0606 04/09/18 1225 04/09/18 1957 04/10/18 0625  BP: 140/82 (!) 104/57 118/64 (!) 149/66  Pulse: 92 73 89 88  Resp: 20 20    Temp: 98.1 F (36.7 C) 97.9 F (36.6 C) 98.5 F (36.9 C) 98.2 F (36.8 C)  TempSrc: Oral Oral Oral Oral  SpO2: 100% 98% 98% 95%  Weight: 122 lb 9.2 oz (55.6 kg)   121 lb 0.5 oz (54.9 kg)  Height:        Intake/Output Summary (Last 24 hours) at 04/10/2018 0909 Last data filed at 04/10/2018 0757 Gross per 24 hour  Intake 736.7 ml  Output 4050 ml  Net -3313.3 ml   Filed Weights   04/08/18 0550 04/09/18 0606 04/10/18 0625  Weight: 118 lb 6.4 oz (53.7 kg) 122 lb 9.2 oz (55.6 kg) 121 lb 0.5 oz (54.9 kg)   Telemetry    A-flutter--> SR this am - Personally Reviewed  ECG    Sinus bradycardia with normal axis and borderline PR prolongation - Personally Reviewed  Physical Exam   General: Well nourished female in no acute distress Pulm: Diminished air movement bilaterally. No obvious wheezing or crackles  CV: RRR, no murmurs, no rus Abdomen: Active bowel sounds, soft, non-distended, no tenderness to palpation    Extremities: Mild pitting edema bilaterally  Skin: Warm and dry  Neuro: Alert and oriented x 3  Labs    Chemistry Recent Labs  Lab 04/08/18 0657 04/09/18 0640 04/10/18 0706  NA 132* 133* 137  K 3.9 3.6 4.1  CL 88* 89* 86*  CO2 35* 37* 38*  GLUCOSE 114* 108* 112*  BUN 25* 26* 25*  CREATININE 1.17* 0.96 1.01*  CALCIUM 9.3 9.3 9.4  GFRNONAA 41* 52* 49*  GFRAA 47* 60* 56*  ANIONGAP 9 7 13     Hematology Recent Labs  Lab 04/06/18 0240 04/07/18 0414 04/08/18 0657  WBC 8.1 6.0 5.3  RBC 3.80* 3.59* 3.59*  HGB 11.3* 10.8* 10.7*  HCT 34.7* 34.4* 33.8*  MCV 91.3 95.8 94.2  MCH 29.7 30.1 29.8  MCHC 32.6 31.4 31.7  RDW 13.0 13.2 13.2  PLT 170 147* 146*   Cardiac Enzymes Recent Labs  Lab 04/06/18 0520 04/06/18 1212 04/06/18 1650  TROPONINI 0.03* 0.03* 0.03*    Recent Labs  Lab 04/06/18 0251  TROPIPOC 0.02    BNP Recent Labs  Lab 04/06/18 0240  BNP 388.3*    DDimer No results for input(s): DDIMER in the last 168 hours.   Radiology    No results found. Cardiac Studies  Echocardiogram 04/06/2018 Study Conclusions  - Left ventricle: The cavity size was normal. Wall thickness was   increased in a pattern of mild LVH. Systolic function was mildly   to moderately reduced. The estimated ejection fraction was in the   range of 40% to 45%. Diffuse hypokinesis. The study is not   technically sufficient to allow evaluation of LV diastolic   function. - Aortic valve: Sclerosis without stenosis. There was trivial   regurgitation. - Mitral valve: Mildly thickened leaflets . There was mild   regurgitation. - Left atrium: Severely dilated. - Atrial septum: No defect or patent foramen ovale was identified. - Tricuspid valve: There was trivial regurgitation. - Pulmonary arteries: PA peak pressure: 28 mm Hg (S). - Inferior vena cava: The vessel was normal in size. The   respirophasic diameter changes were in the normal range (>= 50%),   consistent with normal  central venous pressure.  Impressions:  - Compared to a prior study in 2016, the LVEF is lower at 40-45%   with global hypokinesis, trivial AI, mild MR and severe LAE.  Patient Profile     84 y.o. female who presented with progressive SOB, LE swelling, and abdominal distention. She was subsequently found to have A-fib with RVR.   Assessment & Plan    PAF / Atypical Atrial Flutter - Telemetry reviewed, she went back to SR at 7 am this morning,  - Complete Amiodarone infusion, then start amiodarone 400 mg PO BID x 5 days, followed by 200 mg po BID x days, followed by 200 mg po daily - Continue Metoprolol 25 mg BID - Continuing anticoagulation with Xarelto   HFrEF, Acute  - Echocardiogram showing LVEF is lower at 40-45% with global hypokinesis, trivial AI, mild MR and severe LAE. - While her IVC shows normal respiratory volume changes she does have bilateral LE edema and pulmonary congestion on CXR  - Renal function stable - continue lasix 80 mg iv BID till tomorrow, give one more dose of metolazone 2.5 mg  - Likely tachycardia induced cardiomyopathy but need ischemic evaluation   Demand Ischemia  - Troponin trend: 0.03 > 0.03 > 0.03  - Secondary to A-fib with RVR - no ischemic workup is indicated  For questions or updates, please contact [facility] Please consult [email or web address] for contact info under Cardiology/STEMI.     Signed, Ena Dawley, MD  04/10/2018, 9:09 AM

## 2018-04-10 NOTE — Progress Notes (Signed)
Pt c/o pain left shoulder 10 . She has used her home voltaren gel, updated med list from PTA and paged md, also discussed not taking home meds without md

## 2018-04-10 NOTE — Progress Notes (Signed)
Pt inquired about her lunesta she brought in from home " I was told I could take it if I brought it in and had pharmacy get it out" noted in chart that lunesta 3mg  tabs was delivered to pharmacy, paged md to request pt able to take home med to help sleep at night

## 2018-04-10 NOTE — Progress Notes (Signed)
Clarified with Dr. Meda Coffee, once this bag amiodorone complete, then stop IV amiodorone and give PO.  This bag is now completed, stopped, and PO amiodorone given.  Primary RN made aware.

## 2018-04-11 DIAGNOSIS — Z79899 Other long term (current) drug therapy: Secondary | ICD-10-CM | POA: Diagnosis not present

## 2018-04-11 DIAGNOSIS — R2681 Unsteadiness on feet: Secondary | ICD-10-CM | POA: Diagnosis not present

## 2018-04-11 DIAGNOSIS — I480 Paroxysmal atrial fibrillation: Secondary | ICD-10-CM | POA: Diagnosis not present

## 2018-04-11 DIAGNOSIS — Z96611 Presence of right artificial shoulder joint: Secondary | ICD-10-CM | POA: Diagnosis not present

## 2018-04-11 DIAGNOSIS — S81801A Unspecified open wound, right lower leg, initial encounter: Secondary | ICD-10-CM | POA: Diagnosis not present

## 2018-04-11 DIAGNOSIS — I248 Other forms of acute ischemic heart disease: Secondary | ICD-10-CM | POA: Diagnosis not present

## 2018-04-11 DIAGNOSIS — S0003XA Contusion of scalp, initial encounter: Secondary | ICD-10-CM | POA: Diagnosis not present

## 2018-04-11 DIAGNOSIS — R7981 Abnormal blood-gas level: Secondary | ICD-10-CM | POA: Diagnosis not present

## 2018-04-11 DIAGNOSIS — D649 Anemia, unspecified: Secondary | ICD-10-CM | POA: Diagnosis not present

## 2018-04-11 DIAGNOSIS — L97812 Non-pressure chronic ulcer of other part of right lower leg with fat layer exposed: Secondary | ICD-10-CM | POA: Diagnosis not present

## 2018-04-11 DIAGNOSIS — I509 Heart failure, unspecified: Secondary | ICD-10-CM | POA: Diagnosis not present

## 2018-04-11 DIAGNOSIS — Z8673 Personal history of transient ischemic attack (TIA), and cerebral infarction without residual deficits: Secondary | ICD-10-CM | POA: Diagnosis not present

## 2018-04-11 DIAGNOSIS — Z743 Need for continuous supervision: Secondary | ICD-10-CM | POA: Diagnosis not present

## 2018-04-11 DIAGNOSIS — W1789XA Other fall from one level to another, initial encounter: Secondary | ICD-10-CM | POA: Diagnosis not present

## 2018-04-11 DIAGNOSIS — R339 Retention of urine, unspecified: Secondary | ICD-10-CM | POA: Diagnosis not present

## 2018-04-11 DIAGNOSIS — R262 Difficulty in walking, not elsewhere classified: Secondary | ICD-10-CM | POA: Diagnosis not present

## 2018-04-11 DIAGNOSIS — Z9981 Dependence on supplemental oxygen: Secondary | ICD-10-CM | POA: Diagnosis not present

## 2018-04-11 DIAGNOSIS — I484 Atypical atrial flutter: Secondary | ICD-10-CM | POA: Diagnosis not present

## 2018-04-11 DIAGNOSIS — R079 Chest pain, unspecified: Secondary | ICD-10-CM | POA: Diagnosis not present

## 2018-04-11 DIAGNOSIS — Y998 Other external cause status: Secondary | ICD-10-CM | POA: Diagnosis not present

## 2018-04-11 DIAGNOSIS — Z7901 Long term (current) use of anticoagulants: Secondary | ICD-10-CM | POA: Diagnosis not present

## 2018-04-11 DIAGNOSIS — R001 Bradycardia, unspecified: Secondary | ICD-10-CM | POA: Diagnosis not present

## 2018-04-11 DIAGNOSIS — I5043 Acute on chronic combined systolic (congestive) and diastolic (congestive) heart failure: Secondary | ICD-10-CM | POA: Diagnosis not present

## 2018-04-11 DIAGNOSIS — I959 Hypotension, unspecified: Secondary | ICD-10-CM | POA: Diagnosis not present

## 2018-04-11 DIAGNOSIS — I110 Hypertensive heart disease with heart failure: Secondary | ICD-10-CM | POA: Diagnosis not present

## 2018-04-11 DIAGNOSIS — R278 Other lack of coordination: Secondary | ICD-10-CM | POA: Diagnosis not present

## 2018-04-11 DIAGNOSIS — F5104 Psychophysiologic insomnia: Secondary | ICD-10-CM | POA: Diagnosis not present

## 2018-04-11 DIAGNOSIS — E871 Hypo-osmolality and hyponatremia: Secondary | ICD-10-CM | POA: Diagnosis not present

## 2018-04-11 DIAGNOSIS — Z96653 Presence of artificial knee joint, bilateral: Secondary | ICD-10-CM | POA: Diagnosis not present

## 2018-04-11 DIAGNOSIS — E876 Hypokalemia: Secondary | ICD-10-CM | POA: Diagnosis not present

## 2018-04-11 DIAGNOSIS — M810 Age-related osteoporosis without current pathological fracture: Secondary | ICD-10-CM | POA: Diagnosis not present

## 2018-04-11 DIAGNOSIS — Y92121 Bathroom in nursing home as the place of occurrence of the external cause: Secondary | ICD-10-CM | POA: Diagnosis not present

## 2018-04-11 DIAGNOSIS — S0990XA Unspecified injury of head, initial encounter: Secondary | ICD-10-CM | POA: Diagnosis present

## 2018-04-11 DIAGNOSIS — G4700 Insomnia, unspecified: Secondary | ICD-10-CM | POA: Diagnosis not present

## 2018-04-11 DIAGNOSIS — I4891 Unspecified atrial fibrillation: Secondary | ICD-10-CM | POA: Diagnosis not present

## 2018-04-11 DIAGNOSIS — I5033 Acute on chronic diastolic (congestive) heart failure: Secondary | ICD-10-CM | POA: Diagnosis not present

## 2018-04-11 DIAGNOSIS — R41841 Cognitive communication deficit: Secondary | ICD-10-CM | POA: Diagnosis not present

## 2018-04-11 DIAGNOSIS — E785 Hyperlipidemia, unspecified: Secondary | ICD-10-CM | POA: Diagnosis not present

## 2018-04-11 DIAGNOSIS — M6281 Muscle weakness (generalized): Secondary | ICD-10-CM | POA: Diagnosis not present

## 2018-04-11 DIAGNOSIS — M25569 Pain in unspecified knee: Secondary | ICD-10-CM | POA: Diagnosis not present

## 2018-04-11 DIAGNOSIS — Y9389 Activity, other specified: Secondary | ICD-10-CM | POA: Diagnosis not present

## 2018-04-11 DIAGNOSIS — M25562 Pain in left knee: Secondary | ICD-10-CM | POA: Diagnosis not present

## 2018-04-11 DIAGNOSIS — S064X0A Epidural hemorrhage without loss of consciousness, initial encounter: Secondary | ICD-10-CM | POA: Diagnosis not present

## 2018-04-11 DIAGNOSIS — S0083XA Contusion of other part of head, initial encounter: Secondary | ICD-10-CM | POA: Diagnosis not present

## 2018-04-11 DIAGNOSIS — Z96651 Presence of right artificial knee joint: Secondary | ICD-10-CM | POA: Diagnosis not present

## 2018-04-11 DIAGNOSIS — J9600 Acute respiratory failure, unspecified whether with hypoxia or hypercapnia: Secondary | ICD-10-CM | POA: Diagnosis not present

## 2018-04-11 DIAGNOSIS — M25512 Pain in left shoulder: Secondary | ICD-10-CM | POA: Diagnosis not present

## 2018-04-11 DIAGNOSIS — I10 Essential (primary) hypertension: Secondary | ICD-10-CM | POA: Diagnosis not present

## 2018-04-11 DIAGNOSIS — R279 Unspecified lack of coordination: Secondary | ICD-10-CM | POA: Diagnosis not present

## 2018-04-11 LAB — BASIC METABOLIC PANEL
Anion gap: 11 (ref 5–15)
BUN: 34 mg/dL — AB (ref 6–20)
CO2: 43 mmol/L — AB (ref 22–32)
Calcium: 9.9 mg/dL (ref 8.9–10.3)
Chloride: 83 mmol/L — ABNORMAL LOW (ref 101–111)
Creatinine, Ser: 1.14 mg/dL — ABNORMAL HIGH (ref 0.44–1.00)
GFR calc Af Amer: 49 mL/min — ABNORMAL LOW (ref >60)
GFR calc non Af Amer: 42 mL/min — ABNORMAL LOW (ref >60)
Glucose, Bld: 104 mg/dL — ABNORMAL HIGH (ref 65–99)
POTASSIUM: 4.4 mmol/L (ref 3.5–5.1)
Sodium: 137 mmol/L (ref 135–145)

## 2018-04-11 LAB — CBC
HEMATOCRIT: 35.2 % — AB (ref 36.0–46.0)
Hemoglobin: 11.1 g/dL — ABNORMAL LOW (ref 12.0–15.0)
MCH: 29.4 pg (ref 26.0–34.0)
MCHC: 31.5 g/dL (ref 30.0–36.0)
MCV: 93.4 fL (ref 78.0–100.0)
Platelets: 153 10*3/uL (ref 150–400)
RBC: 3.77 MIL/uL — ABNORMAL LOW (ref 3.87–5.11)
RDW: 13.1 % (ref 11.5–15.5)
WBC: 5.6 10*3/uL (ref 4.0–10.5)

## 2018-04-11 LAB — MAGNESIUM: Magnesium: 1.9 mg/dL (ref 1.7–2.4)

## 2018-04-11 MED ORDER — AMIODARONE HCL 200 MG PO TABS: 200.0000 mg | ORAL_TABLET | Freq: Two times a day (BID) | ORAL | 0 refills | Status: DC

## 2018-04-11 MED ORDER — HYDROXYZINE HCL 10 MG PO TABS: 10.0000 mg | ORAL_TABLET | Freq: Three times a day (TID) | ORAL | 0 refills | Status: DC | PRN

## 2018-04-11 MED ORDER — TRAMADOL HCL 50 MG PO TABS: 100.0000 mg | ORAL_TABLET | Freq: Four times a day (QID) | ORAL | 0 refills | Status: DC | PRN

## 2018-04-11 MED ORDER — FUROSEMIDE 40 MG PO TABS: 40.0000 mg | ORAL_TABLET | Freq: Every day | ORAL | Status: DC

## 2018-04-11 MED ORDER — MELATONIN 3 MG PO TABS: 9.0000 mg | ORAL_TABLET | Freq: Every evening | ORAL | 0 refills | Status: AC | PRN

## 2018-04-11 MED ORDER — HYDROXYZINE HCL 10 MG PO TABS
10.0000 mg | ORAL_TABLET | Freq: Three times a day (TID) | ORAL | Status: DC | PRN
  Administered 2018-04-11: 10 mg via ORAL
  Filled 2018-04-11: qty 1

## 2018-04-11 MED ORDER — METOPROLOL SUCCINATE ER 25 MG PO TB24: 12.5000 mg | ORAL_TABLET | Freq: Every day | ORAL | Status: DC

## 2018-04-11 MED ORDER — METOPROLOL SUCCINATE ER 25 MG PO TB24: 12.5000 mg | ORAL_TABLET | Freq: Every day | ORAL | 0 refills | Status: DC

## 2018-04-11 MED ORDER — AMIODARONE HCL 200 MG PO TABS: 200.0000 mg | ORAL_TABLET | Freq: Every day | ORAL | 0 refills | Status: DC

## 2018-04-11 MED ORDER — FUROSEMIDE 40 MG PO TABS: 40.0000 mg | ORAL_TABLET | Freq: Every day | ORAL | 0 refills | Status: DC

## 2018-04-11 MED ORDER — AMIODARONE HCL 400 MG PO TABS: 400.0000 mg | ORAL_TABLET | Freq: Two times a day (BID) | ORAL | 0 refills | Status: DC

## 2018-04-11 NOTE — Clinical Social Work Placement (Signed)
   CLINICAL SOCIAL WORK PLACEMENT  NOTE  Date:  04/11/2018  Patient Details  Name: [name] MRN: [id number] Date of Birth: 10/10/1928  Clinical Social Work is seeking post-discharge placement for this patient at the [place] level of care (*CSW will initial, date and re-position this form in  chart as items are completed):  Yes   Patient/family provided with [place] Work Department's list of facilities offering this level of care within the geographic area requested by the patient (or if unable, by the patient's family).  Yes   Patient/family informed of their freedom to choose among providers that offer the needed level of care, that participate in Medicare, Medicaid or managed care program needed by the patient, have an available bed and are willing to accept the patient.  Yes   Patient/family informed of [facility]'s ownership interest in [facility] and [facility], as well as of the fact that they are under no obligation to receive care at these facilities.  PASRR submitted to EDS on 04/08/18     PASRR number received on       Existing PASRR number confirmed on 04/08/18     FL2 transmitted to all facilities in geographic area requested by pt/family on 04/08/18     FL2 transmitted to all facilities within larger geographic area on       Patient informed that his/her managed care company has contracts with or will negotiate with certain facilities, including the following:        Yes   Patient/family informed of bed offers received.  Patient chooses bed at [facility]     Physician recommends and patient chooses bed at      Patient to be transferred to [facility] on 04/11/18.  Patient to be transferred to facility by [facility]     Patient family notified on 04/11/18 of transfer.  Name of family member notified:  Melvenia Needles     PHYSICIAN       Additional Comment:    _______________________________________________ Candie Chroman, LCSW 04/11/2018, 3:06 PM

## 2018-04-11 NOTE — Progress Notes (Addendum)
Progress Note  Patient Name: [name] Date of Encounter: 04/11/2018  Primary Cardiologist: Cristopher Peru, MD   Subjective   Denies any chest tightness but still gets short of breath especially if she is off oxygen.  Inpatient Medications    Scheduled Meds: . amiodarone  400 mg Oral BID   Followed by  . [START ON 04/15/2018] amiodarone  200 mg Oral BID   Followed by  . [START ON 04/20/2018] amiodarone  200 mg Oral Daily  . cycloSPORINE  1 drop Both Eyes BID  . eszopiclone  3 mg Oral QHS  . furosemide  80 mg Intravenous BID  . metoprolol succinate  25 mg Oral Daily  . Rivaroxaban  15 mg Oral Q supper  . rivastigmine  4.5 mg Oral BID  . sodium chloride flush  3 mL Intravenous Q12H   Continuous Infusions: . sodium chloride    . sodium chloride    . sodium chloride    . amiodarone Stopped (04/10/18 0958)   PRN Meds: sodium chloride, acetaminophen, diclofenac sodium, Melatonin, nitroGLYCERIN, ondansetron (ZOFRAN) IV, sodium chloride flush, traMADol, zolpidem   Vital Signs    Vitals:   04/11/18 0546 04/11/18 0721 04/11/18 0730 04/11/18 0946  BP:      Pulse:    60  Resp:      Temp:      TempSrc:      SpO2:  (!) 87% 98%   Weight: 114 lb 12.8 oz (52.1 kg)     Height:        Intake/Output Summary (Last 24 hours) at 04/11/2018 1105 Last data filed at 04/11/2018 0947 Gross per 24 hour  Intake 1203 ml  Output 3200 ml  Net -1997 ml   Filed Weights   04/09/18 0606 04/10/18 0625 04/11/18 0546  Weight: 122 lb 9.2 oz (55.6 kg) 121 lb 0.5 oz (54.9 kg) 114 lb 12.8 oz (52.1 kg)    Telemetry    Sinus bradycardia- Personally Reviewed  ECG    No new EKG to review- Personally Reviewed  Physical Exam   GEN: No acute distress.   Neck: No JVD Cardiac: RRR, no murmurs, rubs, or gallops.  Respiratory: Crackles at bases GI: Soft, nontender, non-distended  MS: No edema; No deformity. Neuro:  Nonfocal  Psych: Normal affect   Labs    Chemistry Recent Labs  Lab  04/09/18 0640 04/10/18 0706 04/11/18 0534  NA 133* 137 137  K 3.6 4.1 4.4  CL 89* 86* 83*  CO2 37* 38* 43*  GLUCOSE 108* 112* 104*  BUN 26* 25* 34*  CREATININE 0.96 1.01* 1.14*  CALCIUM 9.3 9.4 9.9  GFRNONAA 52* 49* 42*  GFRAA 60* 56* 49*  ANIONGAP 7 13 11      Hematology Recent Labs  Lab 04/07/18 0414 04/08/18 0657 04/11/18 0534  WBC 6.0 5.3 5.6  RBC 3.59* 3.59* 3.77*  HGB 10.8* 10.7* 11.1*  HCT 34.4* 33.8* 35.2*  MCV 95.8 94.2 93.4  MCH 30.1 29.8 29.4  MCHC 31.4 31.7 31.5  RDW 13.2 13.2 13.1  PLT 147* 146* 153    Cardiac Enzymes Recent Labs  Lab 04/06/18 0520 04/06/18 1212 04/06/18 1650  TROPONINI 0.03* 0.03* 0.03*    Recent Labs  Lab 04/06/18 0251  TROPIPOC 0.02     BNP Recent Labs  Lab 04/06/18 0240  BNP 388.3*     DDimer No results for input(s): DDIMER in the last 168 hours.   Radiology    No results found.  Cardiac  Studies   Echocardiogram 04/06/2018 Study Conclusions  - Left ventricle: The cavity size was normal. Wall thickness was increased in a pattern of mild LVH. Systolic function was mildly to moderately reduced. The estimated ejection fraction was in the range of 40% to 45%. Diffuse hypokinesis. The study is not technically sufficient to allow evaluation of LV diastolic function. - Aortic valve: Sclerosis without stenosis. There was trivial regurgitation. - Mitral valve: Mildly thickened leaflets . There was mild regurgitation. - Left atrium: Severely dilated. - Atrial septum: No defect or patent foramen ovale was identified. - Tricuspid valve: There was trivial regurgitation. - Pulmonary arteries: PA peak pressure: 28 mm Hg (S). - Inferior vena cava: The vessel was normal in size. The respirophasic diameter changes were in the normal range (>= 50%), consistent with normal central venous pressure.  Impressions:  - Compared to a prior study in 2016, the LVEF is lower at 40-45% with global  hypokinesis, trivial AI, mild MR and severe LAE.   Patient Profile     84 y.o. female who presented with progressive SOB, LE swelling, and abdominal distention. She was subsequently found to have A-fib with RVR.   Assessment & Plan    PAF / Atypical Atrial Flutter -She has a normal sinus rhythm on telemetry and exam. -Continue amiodarone 400 mg PO BID x 5 days, followed by 200 mg po BID x days, followed by 200 mg po daily - decrease Metoprolol to 12.5mg  BID since she is bradycardic and we are loading Amio - Continuing anticoagulation with Xarelto   HFrEF, Acute  - Echocardiogram showing LVEF is lower at 40-45% with global hypokinesis, trivial AI, mild MR and severe LAE. -Has no significant lower extremity edema on exam today although she does have a few crackles at the bases.  This could be related to atelectasis. -Creatinine has bumped from 0.96-1.14 and CO2 is now up to 43. -Change Lasix to 40 mg p.o.  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  If it has not then may need to consider ischemic evaluation although patient is very frail with advanced age so could consider medical therapy.  Demand Ischemia  - Troponin trend: 0.03 > 0.03 > 0.03  - Secondary to A-fib with RVR - no ischemic workup is indicated    For questions or updates, please contact [facility] Please consult [email or web address] for contact info under Cardiology/STEMI.      Signed, Fransico Him, MD  04/11/2018, 11:05 AM

## 2018-04-11 NOTE — Progress Notes (Signed)
On rounds, pt was off oxygen " they are trial me with no oxygen, I have that tightness and can tell it is low, ra sat 87-89%,  02 reapplied 1l/[place] with sat up 98% , no distress noted

## 2018-04-11 NOTE — Progress Notes (Signed)
Pt was given home med lunesta and signed receipt, checked room and other belongings ie suitcase and cell phone also sent, pt says she is feeling better except shoulder pain which is chronic, pt dc to [facility] via stretcher with ambulance, report called to [facility] to [place], all questions entertained and answerd

## 2018-04-11 NOTE — Discharge Summary (Addendum)
Physician Discharge Summary  [name] SKA:[id number] DOB: 01-28-1928 DOA: 04/06/2018  PCP: Lajean Manes, MD  Admit date: 04/06/2018 Discharge date: 04/11/2018  Admitted From: Home Disposition:  SNF  Recommendations for Outpatient Follow-up:  1. Follow up with PCP in 1-2 weeks 2. Recommend repeat BMET in 1 week 3. Follow up with Cardiology as scheduled  Discharge Condition:Stable CODE STATUS:Full Diet recommendation: Heart healthy   Brief/Interim Summary: 84 y.o.femalewith medical history significant ofchronic atrial fibrillationon chronic anticoagulation, chronic diastolic CHF,HTN, HLD, OA, history of CVA; who presents withcomplaints of progressively worsening shortness of breath, work-up significant for A. fib with RVR, and CHF, improving on IV diuresis, plan was for DCCV, but she converted spontaneously to NSR.  Acute on chronic combined diastolic /systolic CHF  -Most recent echo 2016, with a preserved EF, grade 1 diastolic dysfunction, evidence of volume overload and imaging, as well elevated BNP, +2 edema bilaterally . -Repeat 2D echo showing dropped EF to 45%, with severe left atrial enlargement. -A. fib with RVR most likely the most contributing mechanism and her volume overload, plan is for cardioversion this morning, but she spontaneously converted to NSR.  She is being loaded with IV amiodarone,transitioned to  p.o. on discharge. -Her CHF most likely provoked by A. fib with RVR. -Cardiology consulted, continue with daily weight, strict ins and outs.  Paresis has improved after receiving as needed metolazone.  -Cardiology has transitioned to PO lasix on day of discharge given rising Cr and CO2.  A. fib with RVR -Patient was noted to be in A. fib with RVR on admission, this was most likely provoking her CHF, recently started on diltiazem by PCP, has been stopped given she is in acute CHF, and started on metoprolol instead . -She was loaded with amiodarone drip, she  converted to normal sinus rhythm the morning before planned direct current cardioversion, which has been canceled, since then she is been back one time in A. fib with RVR, and she is being loaded again with amiodarone, fortunately she converted back to normal sinus rhythm, per cards recommendation, will continue with amiodarone load, then she will need over 400 mg twice daily for 10 days followed by 200 mg p.o. twice daily for 10 days then 200 mg daily thereafter . -continuewith metoprolol 12.5 mg twice daily -Continue with Xarelto for anticoagulation -Repeat EKG on 6/10 done and personally reviewed. Sinus brady, otherwise unremarkable with no ischemic changes  Chest pain - Acute. Initial troponin negative. Patient reports having chest pain relieved with nitroglycerin  Hyponatremia -Secondary to volume overload, improved with IV diuresis   Right leg wound -Wound careconsulted  Essential hypertension -Blood pressure acceptable, she was started on metoprolol  Left shoulder pain -resume Home Voltaren and Ultram   Discharge Diagnoses:  Principal Problem:   Acute on chronic diastolic congestive heart failure (HCC) Active Problems:   Long term (current) use of anticoagulants   Essential hypertension   PAF (paroxysmal atrial fibrillation) (HCC)   Hyponatremia   Acute respiratory failure (HCC)   Normocytic normochromic anemia   Atypical atrial flutter (HCC)    Discharge Instructions   Allergies as of 04/11/2018      Reactions   Codeine Nausea Only      Medication List    STOP taking these medications   DILT-XR 120 MG 24 hr capsule Generic drug:  diltiazem     TAKE these medications   amiodarone 400 MG tablet Commonly known as:  PACERONE Take 1 tablet (400 mg total) by mouth  2 (two) times daily for 10 doses. What changed:    medication strength  See the new instructions.   amiodarone 200 MG tablet Commonly known as:  PACERONE Take 1 tablet (200 mg  total) by mouth 2 (two) times daily for 10 doses. Start taking on:  04/15/2018 What changed:  You were already taking a medication with the same name, and this prescription was added. Make sure you understand how and when to take each.   amiodarone 200 MG tablet Commonly known as:  PACERONE Take 1 tablet (200 mg total) by mouth daily. Start taking on:  04/20/2018 What changed:  You were already taking a medication with the same name, and this prescription was added. Make sure you understand how and when to take each.   diclofenac sodium 1 % Gel Commonly known as:  VOLTAREN Apply 4 g topically as needed (shoulder pain, severe pain).   Eszopiclone 3 MG Tabs TAKE 1 TABLET BY MOUTH IMMEDIATELY BEFORE BEFORE What changed:  See the new instructions.   furosemide 40 MG tablet Commonly known as:  LASIX Take 1 tablet (40 mg total) by mouth daily. Start taking on:  04/12/2018 What changed:    how much to take  when to take this  additional instructions   hydrOXYzine 10 MG tablet Commonly known as:  ATARAX/VISTARIL Take 1 tablet (10 mg total) by mouth 3 (three) times daily as needed for anxiety.   Melatonin 3 MG Tabs Take 3 tablets (9 mg total) by mouth at bedtime as needed (for sleep). What changed:  how much to take   metoprolol succinate 25 MG 24 hr tablet Commonly known as:  TOPROL-XL Take 0.5 tablets (12.5 mg total) by mouth daily. Start taking on:  04/12/2018   nitroGLYCERIN 0.4 MG SL tablet Commonly known as:  NITROSTAT DISSOLVE 1 TABLET UNDER THE TONGUE EVERY 5 MINUTES AS NEEDED FOR CHEST PAIN   RESTASIS 0.05 % ophthalmic emulsion Generic drug:  cycloSPORINE Place 1 drop into both eyes 2 (two) times daily.   rivastigmine 4.5 MG capsule Commonly known as:  EXELON TAKE 1 CAPSULE(4.5 MG) BY MOUTH TWICE DAILY   traMADol 50 MG tablet Commonly known as:  ULTRAM Take 100 mg by mouth every 6 (six) hours as needed. What changed:  Another medication with the same name was  added. Make sure you understand how and when to take each.   traMADol 50 MG tablet Commonly known as:  ULTRAM Take 2 tablets (100 mg total) by mouth every 6 (six) hours as needed for severe pain. What changed:  You were already taking a medication with the same name, and this prescription was added. Make sure you understand how and when to take each.   VITAMIN B-12 PO Take 1 tablet by mouth daily.   VITAMIN D-3 PO Take 1 capsule by mouth daily.   XARELTO 15 MG Tabs tablet Generic drug:  Rivaroxaban TAKE 1 TABLET(15 MG) BY MOUTH DAILY WITH SUPPER       Contact information for follow-up providers    Home, [facility] Follow up.   Specialty:  [place] Why:  They will continue to do your home health care at your home Contact information: [place] [place] [address] [phone]            Contact information for after-discharge care    Destination    [facility] .   Service:  Skilled Nursing Contact information: [place]  [address] [phone]                 Allergies  Allergen Reactions  . Codeine Nausea Only    Consultations:  Cardiology  Procedures/Studies: Dg Chest Portable 1 View  Result Date: 04/06/2018 CLINICAL DATA:  Shortness of breath tonight. EXAM: PORTABLE CHEST 1 VIEW COMPARISON:  Frontal and lateral views 05/26/2016 FINDINGS: Mild cardiomegaly with aortic atherosclerosis. Moderate pulmonary edema with Kerley B-lines. Small bilateral pleural effusions. No confluent airspace disease. The previously questioned nodular density in the right lower lung zone is not currently visualized. No pneumothorax. Right proximal humeral arthroplasty. Chronic change about the left shoulder. Remote right rib fractures. IMPRESSION: Cardiomegaly with moderate pulmonary edema and small pleural effusions, consistent with CHF. Electronically Signed   By: Jeb Levering M.D.   On: 04/06/2018 03:13     Subjective: Without complaints at this time. Reports voiding well  Discharge Exam: Vitals:   04/11/18 0946 04/11/18 1152  BP:  (!) 150/57  Pulse: 60 (!) 55  Resp:  20  Temp:    SpO2:  100%   Vitals:   04/11/18 0721 04/11/18 0730 04/11/18 0946 04/11/18 1152  BP:    (!) 150/57  Pulse:   60 (!) 55  Resp:    20  Temp:      TempSrc:      SpO2: (!) 87% 98%  100%  Weight:      Height:        General: Pt is alert, awake, not in acute distress Cardiovascular: RRR, S1/S2 +, no rubs, no gallops Respiratory: CTA bilaterally, no wheezing, no rhonchi Abdominal: Soft, NT, ND, bowel sounds + Extremities: no edema, no cyanosis   The results of significant diagnostics from this hospitalization (including imaging, microbiology, ancillary and laboratory) are listed below for reference.     Microbiology: No results found for this or any previous visit (from the past 240 hour(s)).   Labs: BNP (last 3 results) Recent Labs    04/06/18 0240  BNP 474.2*   Basic Metabolic Panel: Recent Labs  Lab 04/06/18 0520 04/07/18 0414 04/08/18 0657 04/09/18 0640 04/10/18 0706 04/11/18 0534  NA  --  131* 132* 133* 137 137  K  --  4.5 3.9 3.6 4.1 4.4  CL  --  88* 88* 89* 86* 83*  CO2  --  33* 35* 37* 38* 43*  GLUCOSE  --  105* 114* 108* 112* 104*  BUN  --  23* 25* 26* 25* 34*  CREATININE  --  1.04* 1.17* 0.96 1.01* 1.14*  CALCIUM  --  9.5 9.3 9.3 9.4 9.9  MG 1.8  --  1.8  --   --  1.9   Liver Function Tests: No results for input(s): AST, ALT, ALKPHOS, BILITOT, PROT, ALBUMIN in the last 168 hours. No results for input(s): LIPASE, AMYLASE in the last 168 hours. No results for input(s): AMMONIA in the last 168 hours. CBC: Recent Labs  Lab 04/06/18 0240 04/07/18 0414 04/08/18 0657 04/11/18 0534  WBC 8.1 6.0 5.3 5.6  NEUTROABS  --  4.5 3.8  --   HGB 11.3* 10.8* 10.7* 11.1*  HCT 34.7* 34.4* 33.8* 35.2*  MCV 91.3 95.8 94.2 93.4  PLT 170 147* 146* 153   Cardiac Enzymes: Recent  Labs  Lab 04/06/18 0520 04/06/18 1212 04/06/18 1650  TROPONINI 0.03* 0.03* 0.03*   BNP: Invalid input(s): POCBNP CBG: Recent Labs  Lab 04/06/18 1134  GLUCAP 100*   D-Dimer No results for input(s): DDIMER in  the last 72 hours. Hgb A1c No results for input(s): HGBA1C in the last 72 hours. Lipid Profile No results for input(s): CHOL, HDL, LDLCALC, TRIG, CHOLHDL, LDLDIRECT in the last 72 hours. Thyroid function studies No results for input(s): TSH, T4TOTAL, T3FREE, THYROIDAB in the last 72 hours.  Invalid input(s): FREET3 Anemia work up No results for input(s): VITAMINB12, FOLATE, FERRITIN, TIBC, IRON, RETICCTPCT in the last 72 hours. Urinalysis    Component Value Date/Time   COLORURINE YELLOW 04/06/2018 2249   APPEARANCEUR CLEAR 04/06/2018 2249   LABSPEC 1.013 04/06/2018 2249   PHURINE 5.0 04/06/2018 2249   GLUCOSEU NEGATIVE 04/06/2018 2249   HGBUR NEGATIVE 04/06/2018 2249   BILIRUBINUR NEGATIVE 04/06/2018 2249   KETONESUR NEGATIVE 04/06/2018 2249   PROTEINUR NEGATIVE 04/06/2018 2249   UROBILINOGEN 0.2 08/16/2011 0227   NITRITE NEGATIVE 04/06/2018 2249   LEUKOCYTESUR NEGATIVE 04/06/2018 2249   Sepsis Labs Invalid input(s): PROCALCITONIN,  WBC,  LACTICIDVEN Microbiology No results found for this or any previous visit (from the past 240 hour(s)).  Time spent: 60min  SIGNED:   Marylu Lund, MD  [facility] 04/11/2018, 2:15 PM  If 7PM-7AM, please contact night-coverage [email or web address] Password [email or web address]

## 2018-04-11 NOTE — Clinical Social Work Note (Signed)
CSW facilitated patient discharge including contacting patient family and facility to confirm patient discharge plans. Clinical information faxed to facility and family agreeable with plan. CSW arranged ambulance transport via [facility] to [facility]. RN to call report prior to discharge [phone] Room 1205P).  CSW will sign off for now as social work intervention is no longer needed. Please consult [place] again if new needs arise.  Dayton Scrape, [place]

## 2018-04-12 DIAGNOSIS — I4891 Unspecified atrial fibrillation: Secondary | ICD-10-CM | POA: Diagnosis not present

## 2018-04-12 DIAGNOSIS — I509 Heart failure, unspecified: Secondary | ICD-10-CM | POA: Diagnosis not present

## 2018-04-12 DIAGNOSIS — I10 Essential (primary) hypertension: Secondary | ICD-10-CM | POA: Diagnosis not present

## 2018-04-12 DIAGNOSIS — S81801A Unspecified open wound, right lower leg, initial encounter: Secondary | ICD-10-CM | POA: Diagnosis not present

## 2018-04-13 ENCOUNTER — Ambulatory Visit: Payer: PRIVATE HEALTH INSURANCE | Admitting: Nurse Practitioner

## 2018-04-13 DIAGNOSIS — Z8673 Personal history of transient ischemic attack (TIA), and cerebral infarction without residual deficits: Secondary | ICD-10-CM | POA: Diagnosis not present

## 2018-04-13 DIAGNOSIS — I509 Heart failure, unspecified: Secondary | ICD-10-CM | POA: Diagnosis not present

## 2018-04-13 DIAGNOSIS — S81801A Unspecified open wound, right lower leg, initial encounter: Secondary | ICD-10-CM | POA: Diagnosis not present

## 2018-04-13 DIAGNOSIS — Z96653 Presence of artificial knee joint, bilateral: Secondary | ICD-10-CM | POA: Diagnosis not present

## 2018-04-13 DIAGNOSIS — G4700 Insomnia, unspecified: Secondary | ICD-10-CM | POA: Diagnosis not present

## 2018-04-13 DIAGNOSIS — I4891 Unspecified atrial fibrillation: Secondary | ICD-10-CM | POA: Diagnosis not present

## 2018-04-13 DIAGNOSIS — L97812 Non-pressure chronic ulcer of other part of right lower leg with fat layer exposed: Secondary | ICD-10-CM | POA: Diagnosis not present

## 2018-04-13 DIAGNOSIS — Z96611 Presence of right artificial shoulder joint: Secondary | ICD-10-CM | POA: Diagnosis not present

## 2018-04-14 ENCOUNTER — Telehealth: Payer: Self-pay | Admitting: Neurology

## 2018-04-14 NOTE — Telephone Encounter (Signed)
I called the son, apparently patient is at [facility] for rehab, for whatever reason they have been refusing to give her Lunesta at night for sleep.  The patient has been on Lunesta for some quite some time, I have called [facility] and I have talked to her nurse.  The patient does have an order for Lunesta, there are no concerns about giving it, they will make sure that she gets it if needed at night.  The telephone number for [facility] is [phone]

## 2018-04-14 NOTE — Telephone Encounter (Signed)
Pts son requesting a call back, stating the nursing home the pt is currently refusing to give the pt her Lunesta. Pts son stating this is "tourture" to his mother not giving her a sleeping aid. Please call to advise

## 2018-04-18 DIAGNOSIS — R7981 Abnormal blood-gas level: Secondary | ICD-10-CM | POA: Diagnosis not present

## 2018-04-18 DIAGNOSIS — I4891 Unspecified atrial fibrillation: Secondary | ICD-10-CM | POA: Diagnosis not present

## 2018-04-18 DIAGNOSIS — R001 Bradycardia, unspecified: Secondary | ICD-10-CM | POA: Diagnosis not present

## 2018-04-19 DIAGNOSIS — E876 Hypokalemia: Secondary | ICD-10-CM | POA: Diagnosis not present

## 2018-04-19 DIAGNOSIS — I509 Heart failure, unspecified: Secondary | ICD-10-CM | POA: Diagnosis not present

## 2018-04-19 DIAGNOSIS — R7981 Abnormal blood-gas level: Secondary | ICD-10-CM | POA: Diagnosis not present

## 2018-04-22 ENCOUNTER — Encounter: Payer: Self-pay | Admitting: Nurse Practitioner

## 2018-04-25 ENCOUNTER — Ambulatory Visit: Payer: Medicare Other | Admitting: Nurse Practitioner

## 2018-04-25 NOTE — Progress Notes (Deleted)
CARDIOLOGY OFFICE NOTE  Date:  04/25/2018    [name] Date of Birth: 1927/11/09 Medical Record #[id number]  PCP:  Lajean Manes, MD  Cardiologist:  Atilano Median    No chief complaint on file.   History of Present Illness: [name] is a 84 y.o. female who presents today for a follow up/post hospital visit. Seen for Dr. Lovena Le. Former patient of Dr. Susa Simmonds. I previously took care of her husband [name] for many years.   She has a history of paroxysmal atrial fibrillation, prior stroke, dyslipidemia, hypertension, and diastolic heart failure. The patient has rheumatoid arthritis. Her atrial arrhythmias have beenwell-controlled. She was previously on chronic anticoagulation - stopped in 2018 due to anemia.   Last seen by Dr. Lovena Le back in May of 2016 - felt to be doing ok from our standpoint.  I saw her back September of 2016 - lots more swelling in her legs - worse after being switched over to Verapamil. Good EF by echo. Her CCB was stopped.   She was hospitalized back in July of 2017 with acute on chronic diastolic HF. HR initially elevated but improved with metoprolol. She was in atrial fib - looks to me like this was probably the trigger for her demise. Looks like itwas assumed she hadchronic AF - however she was in NSR on EKG last year. Placed on Xarelto for her anticoagulation. Metoprolol dose was increased along with her Lasix.   I saw her back for follow up - I felt like her AF was the probable trigger for recent exacerbation. Discussed with Dr. Lovena Le and we elected to place her on amiodarone therapy - low dose. She has done ok - we have had to cut her beta blocker back and subsequently stop due to bradycardia.   Seen back in June of 2018 - was doing ok but dealing with a wound on her lower left leg - ended up finding out that she was anemic - stopped her anticoagulation and got her back to see Dr. Felipa Eth.   Last visit with me was back in December - she  was back on Xarelto. She has called several times since - has not wanted to be seen due to issues with transportation. Felt to probably be back in AF - placed back on CCB - had swelling again and then ended up being admitted earlier this month with progressive dyspnea - found to be in AF with RVR - had her dose of amiodarone increased - she was to be cardioverted but converted on her own.   Comes in today. Here alone.   Past Medical History:  Diagnosis Date  . Arthritis    severe  . Atrial fibrillation ([place])   . Chronic diastolic CHF (congestive heart failure) ([place])   . Chronic insomnia   . Degenerative arthritis   . Essential hypertension   . Hypercholesteremia   . Memory deficit 05/24/2013  . Osteoporosis   . PAF (paroxysmal atrial fibrillation) ([place])    a. Intolerant to amiodarone, discontinued October 2012.  Marland Kitchen RLS (restless legs syndrome) 08/11/2016  . Sinus bradycardia   . Stroke [facility]) 2007   mini stroke or TIA with aphasia     Past Surgical History:  Procedure Laterality Date  . CATARACT EXTRACTION  2009   ou  . FEMUR FRACTURE SURGERY  8/12  . left knee  2001  . REPLACEMENT TOTAL KNEE BILATERAL Right 1999  . right intertrochanteric hip fracture status post ORIF  August 2012  . right shoulder replacement  2009     Medications: No outpatient medications have been marked as taking for the 04/25/18 encounter (Appointment) with Burtis Junes, NP.     Allergies: Allergies  Allergen Reactions  . Codeine Nausea Only    Social History: The patient  reports that she has never smoked. She has never used smokeless tobacco. She reports that she drinks about 4.2 oz of alcohol per week. She reports that she does not use drugs.   Family History: The patient's ***family history includes Cancer in her father, sister, sister, and son; Cancer - Colon in her father; Coronary artery disease in her unknown relative; Dementia in her mother; Stroke in her mother.   Review of  Systems: Please see the history of present illness.   Otherwise, the review of systems is positive for {NONE DEFAULTED:18576::"none"}.   All other systems are reviewed and negative.   Physical Exam: VS:  There were no vitals taken for this visit. Marland Kitchen  BMI There is no height or weight on file to calculate BMI.  Wt Readings from Last 3 Encounters:  04/11/18 114 lb 12.8 oz (52.1 kg)  02/10/18 122 lb (55.3 kg)  10/13/17 122 lb 12.8 oz (55.7 kg)    General: Pleasant. Well developed, well nourished and in no acute distress.   HEENT: Normal.  Neck: Supple, no JVD, carotid bruits, or masses noted.  Cardiac: ***Regular rate and rhythm. No murmurs, rubs, or gallops. No edema.  Respiratory:  Lungs are clear to auscultation bilaterally with normal work of breathing.  GI: Soft and nontender.  MS: No deformity or atrophy. Gait and ROM intact.  Skin: Warm and dry. Color is normal.  Neuro:  Strength and sensation are intact and no gross focal deficits noted.  Psych: Alert, appropriate and with normal affect.   LABORATORY DATA:  EKG:  EKG {ACTION; IS/IS XQJ:[id number]} ordered today. This demonstrates ***.  Lab Results  Component Value Date   WBC 5.6 04/11/2018   HGB 11.1 (L) 04/11/2018   HCT 35.2 (L) 04/11/2018   PLT 153 04/11/2018   GLUCOSE 104 (H) 04/11/2018   ALT 15 10/13/2017   AST 24 10/13/2017   NA 137 04/11/2018   K 4.4 04/11/2018   CL 83 (L) 04/11/2018   CREATININE 1.14 (H) 04/11/2018   BUN 34 (H) 04/11/2018   CO2 43 (H) 04/11/2018   TSH 0.837 04/06/2018   INR 1.26 05/26/2016     BNP (last 3 results) Recent Labs    04/06/18 0240  BNP 388.3*    ProBNP (last 3 results) No results for input(s): PROBNP in the last 8760 hours.   Other Studies Reviewed Today:   Assessment/Plan: Echo Study Conclusions from 03/2015  - Left ventricle: The cavity size was normal. Wall thickness was increased in a pattern of mild LVH. Systolic function was normal. The estimated ejection  fraction was in the range of 55% to 60%. Doppler parameters are consistent with abnormal left ventricular relaxation (grade 1 diastolic dysfunction). - Aortic valve: There was mild regurgitation. - Mitral valve: Calcified annulus. Mildly thickened leaflets . There was mild regurgitation. - Atrial septum: No defect or patent foramen ovale was identified. - Pulmonary arteries: PA peak pressure: 35 mm Hg (S).   Lower Extremity Duplex Summary: No evidence of deep vein thrombosis involving the right common femoral vein and left lower extremity.  Other specific details can be found in the table(s) above. Prepared and Electronically Authenticated by  Gae Gallop  MD 2016-09-02T14:05:48   Assessment/Plan:  1. Complains of hair loss -not discussed today.   2. Chest pain -currently not an issue - would favor continued conservative management.  3. Chronic diastolic HF - with pastexacerbation thatI suspected wasdue to recurrent AF with RVR. EKG today shows she remains in NSR. Only on amiodarone back to just 100 mg a day andno longer on her Metoprolol.   4. PAF - back in NSR by exam and by EKG today. HR has improved. Needs surveillance labs today.   5. Chronic anticoagulation - no problems noted. She is back on Xarelto. Needs labs today.   6. CKD   7. Advanced age.She still does surprisingly well.    Current medicines are reviewed with the patient today.  The patient does not have concerns regarding medicines other than what has been noted above.  The following changes have been made:  See above.  Labs/ tests ordered today include:   No orders of the defined types were placed in this encounter.    Disposition:   FU with *** in {gen number 1-61:096045} {Days to years:10300}.   Patient is agreeable to this plan and will call if any problems develop in the interim.   SignedTruitt Merle, NP  04/25/2018 7:37 AM  [facility] [address] [place], [place]  [address] Phone: [phone] Fax: [phone]

## 2018-04-26 ENCOUNTER — Encounter: Payer: Self-pay | Admitting: Nurse Practitioner

## 2018-04-26 ENCOUNTER — Ambulatory Visit: Payer: Medicare Other | Admitting: Neurology

## 2018-04-26 DIAGNOSIS — I10 Essential (primary) hypertension: Secondary | ICD-10-CM | POA: Diagnosis not present

## 2018-04-26 DIAGNOSIS — Z9981 Dependence on supplemental oxygen: Secondary | ICD-10-CM | POA: Diagnosis not present

## 2018-04-26 DIAGNOSIS — I4891 Unspecified atrial fibrillation: Secondary | ICD-10-CM | POA: Diagnosis not present

## 2018-04-27 ENCOUNTER — Telehealth: Payer: Self-pay | Admitting: *Deleted

## 2018-04-27 NOTE — Telephone Encounter (Signed)
lvm for the scheduler at [facility] to see why pt did not show for appt on Monday., June 24.

## 2018-04-27 NOTE — Telephone Encounter (Signed)
[name] from [facility] calling back to schedule pt's appt.  Stated did not know about pt  last appt.  Confirmed date and time of ov.

## 2018-04-27 NOTE — Telephone Encounter (Signed)
-----   Message from Burtis Junes, NP sent at 04/25/2018  2:40 PM EDT ----- She did not come Monday.  lori

## 2018-04-29 DIAGNOSIS — M25569 Pain in unspecified knee: Secondary | ICD-10-CM | POA: Diagnosis not present

## 2018-04-29 DIAGNOSIS — I10 Essential (primary) hypertension: Secondary | ICD-10-CM | POA: Diagnosis not present

## 2018-04-29 DIAGNOSIS — Z9981 Dependence on supplemental oxygen: Secondary | ICD-10-CM | POA: Diagnosis not present

## 2018-05-02 DIAGNOSIS — M25562 Pain in left knee: Secondary | ICD-10-CM | POA: Diagnosis not present

## 2018-05-03 ENCOUNTER — Emergency Department (HOSPITAL_COMMUNITY)
Admission: EM | Admit: 2018-05-03 | Discharge: 2018-05-04 | Disposition: A | Discharge status: Home / Self Care | Payer: Medicare Other | Attending: Emergency Medicine | Admitting: Emergency Medicine

## 2018-05-03 ENCOUNTER — Other Ambulatory Visit: Payer: Self-pay

## 2018-05-03 ENCOUNTER — Encounter (HOSPITAL_COMMUNITY): Payer: Self-pay | Admitting: Emergency Medicine

## 2018-05-03 DIAGNOSIS — Z96611 Presence of right artificial shoulder joint: Secondary | ICD-10-CM | POA: Insufficient documentation

## 2018-05-03 DIAGNOSIS — Y92121 Bathroom in nursing home as the place of occurrence of the external cause: Secondary | ICD-10-CM | POA: Insufficient documentation

## 2018-05-03 DIAGNOSIS — S0003XA Contusion of scalp, initial encounter: Secondary | ICD-10-CM | POA: Diagnosis not present

## 2018-05-03 DIAGNOSIS — Z7901 Long term (current) use of anticoagulants: Secondary | ICD-10-CM | POA: Insufficient documentation

## 2018-05-03 DIAGNOSIS — Z96651 Presence of right artificial knee joint: Secondary | ICD-10-CM | POA: Diagnosis not present

## 2018-05-03 DIAGNOSIS — I110 Hypertensive heart disease with heart failure: Secondary | ICD-10-CM | POA: Diagnosis not present

## 2018-05-03 DIAGNOSIS — I5033 Acute on chronic diastolic (congestive) heart failure: Secondary | ICD-10-CM | POA: Insufficient documentation

## 2018-05-03 DIAGNOSIS — Z79899 Other long term (current) drug therapy: Secondary | ICD-10-CM | POA: Diagnosis not present

## 2018-05-03 DIAGNOSIS — S0083XA Contusion of other part of head, initial encounter: Secondary | ICD-10-CM | POA: Insufficient documentation

## 2018-05-03 DIAGNOSIS — Z8673 Personal history of transient ischemic attack (TIA), and cerebral infarction without residual deficits: Secondary | ICD-10-CM | POA: Diagnosis not present

## 2018-05-03 DIAGNOSIS — W1789XA Other fall from one level to another, initial encounter: Secondary | ICD-10-CM | POA: Insufficient documentation

## 2018-05-03 DIAGNOSIS — Y998 Other external cause status: Secondary | ICD-10-CM | POA: Insufficient documentation

## 2018-05-03 DIAGNOSIS — S0990XA Unspecified injury of head, initial encounter: Secondary | ICD-10-CM | POA: Diagnosis not present

## 2018-05-03 DIAGNOSIS — Y9389 Activity, other specified: Secondary | ICD-10-CM | POA: Insufficient documentation

## 2018-05-03 DIAGNOSIS — S199XXA Unspecified injury of neck, initial encounter: Secondary | ICD-10-CM | POA: Diagnosis not present

## 2018-05-03 DIAGNOSIS — W19XXXA Unspecified fall, initial encounter: Secondary | ICD-10-CM

## 2018-05-03 DIAGNOSIS — I10 Essential (primary) hypertension: Secondary | ICD-10-CM | POA: Diagnosis not present

## 2018-05-03 NOTE — ED Triage Notes (Signed)
Patient arrived via ems, from [facility], patient fell and hit her head after her knees gave out on her. Patient currently on Xarelto. AOx4. Per ems vitals 170/80, P 70. Bruising noted on forehead.

## 2018-05-03 NOTE — ED Provider Notes (Addendum)
[facility] EMERGENCY DEPARTMENT Provider Note   CSN: [id number] Arrival date & time: 05/03/18  2322     History   Chief Complaint Chief Complaint  Patient presents with  . Fall    HPI [name] is a 84 y.o. female.  Patient presents to the emergency department with a chief complaint of mechanical fall.  She arrives via EMS from [facility] rehabilitation center after having had a fall today.  She states that her right knee gave out while she was getting off of the toilet.  She states that she fell and hit her forehead.  She did not pass out.  She is anticoagulated on Xarelto for A. fib.  She denies any other injuries.  She denies slurred speech, numbness, weakness, tingling, or seizure.  The history is provided by the patient. No language interpreter was used.    Past Medical History:  Diagnosis Date  . Arthritis    severe  . Atrial fibrillation ([place])   . Chronic diastolic CHF (congestive heart failure) ([place])   . Chronic insomnia   . Degenerative arthritis   . Essential hypertension   . Hypercholesteremia   . Memory deficit 05/24/2013  . Osteoporosis   . PAF (paroxysmal atrial fibrillation) ([place])    a. Intolerant to amiodarone, discontinued October 2012.  Marland Kitchen RLS (restless legs syndrome) 08/11/2016  . Sinus bradycardia   . Stroke [facility]) 2007   mini stroke or TIA with aphasia     Patient Active Problem List   Diagnosis Date Noted  . Atypical atrial flutter ([place])   . CHF (congestive heart failure) ([place]) 04/06/2018  . Hyponatremia 04/06/2018  . Acute respiratory failure ([place]) 04/06/2018  . Normocytic normochromic anemia 04/06/2018  . RLS (restless legs syndrome) 08/11/2016  . Demand ischemia ([place]) 05/27/2016  . Acute on chronic diastolic congestive heart failure ([place]) 05/26/2016  . Atrial fibrillation with RVR ([place]) 05/26/2016  . Insomnia 01/22/2016  . Sinus bradycardia   . Chronic diastolic CHF (congestive heart failure) ([place])   . Essential  hypertension   . PAF (paroxysmal atrial fibrillation) ([place])   . Stroke ([place])   . Memory deficit 05/24/2013  . Arthritis 10/13/2011  . Long term (current) use of anticoagulants 07/21/2011  . Dyslipidemia 07/10/2011    Past Surgical History:  Procedure Laterality Date  . CATARACT EXTRACTION  2009   ou  . FEMUR FRACTURE SURGERY  8/12  . left knee  2001  . REPLACEMENT TOTAL KNEE BILATERAL Right 1999  . right intertrochanteric hip fracture status post ORIF  August 2012  . right shoulder replacement  2009     OB History   None      Home Medications    Prior to Admission medications   Medication Sig Start Date End Date Taking? Authorizing Provider  amiodarone (PACERONE) 200 MG tablet Take 1 tablet (200 mg total) by mouth 2 (two) times daily for 10 doses. 04/15/18 04/20/18  Donne Hazel, MD  amiodarone (PACERONE) 200 MG tablet Take 1 tablet (200 mg total) by mouth daily. 04/20/18   Donne Hazel, MD  amiodarone (PACERONE) 400 MG tablet Take 1 tablet (400 mg total) by mouth 2 (two) times daily for 10 doses. 04/11/18 04/16/18  Donne Hazel, MD  Cholecalciferol (VITAMIN D-3 PO) Take 1 capsule by mouth daily.    [provider]  Cyanocobalamin (VITAMIN B-12 PO) Take 1 tablet by mouth daily.    [provider]  diclofenac sodium (VOLTAREN) 1 %  GEL Apply 4 g topically as needed (shoulder pain, severe pain).    [provider]  Eszopiclone 3 MG TABS TAKE 1 TABLET BY MOUTH IMMEDIATELY BEFORE BEFORE Patient taking differently: TAKE 1 TABLET BY MOUTH AT BEDTIME 03/29/18   Kathrynn Ducking, MD  furosemide (LASIX) 40 MG tablet Take 1 tablet (40 mg total) by mouth daily. 04/12/18   Donne Hazel, MD  hydrOXYzine (ATARAX/VISTARIL) 10 MG tablet Take 1 tablet (10 mg total) by mouth 3 (three) times daily as needed for anxiety. 04/11/18   Donne Hazel, MD  Melatonin 3 MG TABS Take 3 tablets (9 mg total) by mouth at bedtime as needed (for sleep). 04/11/18   Donne Hazel, MD  metoprolol succinate (TOPROL-XL) 25 MG 24 hr tablet Take 0.5 tablets (12.5 mg total) by mouth daily. 04/12/18   Donne Hazel, MD  nitroGLYCERIN (NITROSTAT) 0.4 MG SL tablet DISSOLVE 1 TABLET UNDER THE TONGUE EVERY 5 MINUTES AS NEEDED FOR CHEST PAIN 03/14/18   Burtis Junes, NP  RESTASIS 0.05 % ophthalmic emulsion Place 1 drop into both eyes 2 (two) times daily. 03/24/16   [provider]  rivastigmine (EXELON) 4.5 MG capsule TAKE 1 CAPSULE(4.5 MG) BY MOUTH TWICE DAILY 10/28/17   Kathrynn Ducking, MD  traMADol (ULTRAM) 50 MG tablet Take 100 mg by mouth every 6 (six) hours as needed.    [provider]  traMADol (ULTRAM) 50 MG tablet Take 2 tablets (100 mg total) by mouth every 6 (six) hours as needed for severe pain. 04/11/18   Donne Hazel, MD  XARELTO 15 MG TABS tablet TAKE 1 TABLET(15 MG) BY MOUTH DAILY WITH SUPPER 01/25/18   Evans Lance, MD    Family History Family History  Problem Relation Age of Onset  . Dementia Mother   . Stroke Mother   . Cancer - Colon Father   . Cancer Father   . Cancer Sister   . Cancer Son   . Cancer Sister   . Coronary artery disease Unknown     Social History Social History   Tobacco Use  . Smoking status: Never Smoker  . Smokeless tobacco: Never Used  Substance Use Topics  . Alcohol use: Yes    Alcohol/week: 4.2 oz    Types: 7 Glasses of wine per week    Comment: 1 glass wine with dinner some days  . Drug use: No     Allergies   Codeine   Review of Systems Review of Systems  All other systems reviewed and are negative.    Physical Exam Updated Vital Signs BP (!) 174/62 (BP Location: Right Arm)   Pulse 84   Temp 98.5 F (36.9 C) (Oral)   Resp 16   Ht 4\' 11"  (1.499 m)   Wt 49.9 kg (110 lb)   SpO2 99%   BMI 22.22 kg/m   Physical Exam  Constitutional: She is oriented to person, place, and time. She appears well-developed and well-nourished.  HENT:  Head: Normocephalic and atraumatic.    Frontal bone hematoma/contusion, no crepitus  Eyes: Pupils are equal, round, and reactive to light. Conjunctivae and EOM are normal.  Neck: Normal range of motion. Neck supple.  Cardiovascular: Normal rate and regular rhythm. Exam reveals no gallop and no friction rub.  No murmur heard. Pulmonary/Chest: Effort normal and breath sounds normal. No respiratory distress. She has no wheezes. She has no rales. She exhibits no tenderness.  Abdominal: Soft. Bowel sounds are normal.  She exhibits no distension and no mass. There is no tenderness. There is no rebound and no guarding.  Musculoskeletal: Normal range of motion. She exhibits no edema or tenderness.  Neurological: She is alert and oriented to person, place, and time.  CN III-XII intact, speech is clear, movements are goal oriented, moves all extremities  Skin: Skin is warm and dry.  Psychiatric: She has a normal mood and affect. Her behavior is normal. Judgment and thought content normal.  Nursing note and vitals reviewed.    ED Treatments / Results  Labs (all labs ordered are listed, but only abnormal results are displayed) Labs Reviewed - No data to display  EKG EKG Interpretation  Date/Time:  Wednesday May 04 2018 01:02:16 EDT Ventricular Rate:  67 PR Interval:    QRS Duration: 102 QT Interval:  462 QTC Calculation: 488 R Axis:   24 Text Interpretation:  Sinus rhythm RSR' in V1 or V2, right VCD or RVH Minimal ST elevation, inferior leads Borderline prolonged QT interval Interpretation limited secondary to artifact Confirmed by Ripley Fraise [phone]) on 05/04/2018 1:20:13 AM   Radiology No results found.  Procedures Procedures (including critical care time)  Medications Ordered in ED Medications - No data to display   Initial Impression / Assessment and Plan / ED Course  I have reviewed the triage vital signs and the nursing notes.  Pertinent labs & imaging results that were available during my care of the patient  were reviewed by me and considered in my medical decision making (see chart for details).      I had a lengthy telephone conversation with the patient's son, who was concerned because she had mixed up a few words and identified him as his brother.  I attempted to reassure him that we were not seeing anything dangerous today, and that the patient is alert and oriented.  Patient has no neuro deficits.  CT scan is negative for bleeding.  No evidence of fracture.  I did discuss the results in detail with the patient.  She feels reassured, and would like to be discharged.  The son was concerned about her trying to act well, but I see no indication for observation or admission in this patient.  She is currently staying at [facility] rehab center.  She reportedly called this [facility], which was concerning to the son, but in my separate evaluations, she has always referred to it as [place].  She was able to ambulate down the hall with the RN and a walker.  We discussed the risk of delayed bleeding given that she is on Xarelto, and have gone over return precautions.  I did discuss this with the son as well.  Patient seen by discussed with Dr. Christy Gentles, who agrees with the plan.  3:49 AM I attempted to notify the son that there be no change in the patient's disposition plan, and that we have monitored her for several hours and she has no change in her symptoms, however he did not answer his phone and his inbox was full, leaving me without an option to leave a message.     Final Clinical Impressions(s) / ED Diagnoses   Final diagnoses:  Fall, initial encounter    ED Discharge Orders    None       Montine Circle, PA-C 05/04/18 0350    Montine Circle, PA-C 05/04/18 [id number]    Ripley Fraise, MD 05/04/18 [phone]

## 2018-05-04 ENCOUNTER — Emergency Department (HOSPITAL_COMMUNITY): Payer: Medicare Other

## 2018-05-04 DIAGNOSIS — Z7401 Bed confinement status: Secondary | ICD-10-CM | POA: Diagnosis not present

## 2018-05-04 DIAGNOSIS — S199XXA Unspecified injury of neck, initial encounter: Secondary | ICD-10-CM | POA: Diagnosis not present

## 2018-05-04 DIAGNOSIS — Z96651 Presence of right artificial knee joint: Secondary | ICD-10-CM | POA: Diagnosis not present

## 2018-05-04 DIAGNOSIS — I480 Paroxysmal atrial fibrillation: Secondary | ICD-10-CM | POA: Diagnosis not present

## 2018-05-04 DIAGNOSIS — I129 Hypertensive chronic kidney disease with stage 1 through stage 4 chronic kidney disease, or unspecified chronic kidney disease: Secondary | ICD-10-CM | POA: Diagnosis not present

## 2018-05-04 DIAGNOSIS — R079 Chest pain, unspecified: Secondary | ICD-10-CM | POA: Diagnosis not present

## 2018-05-04 DIAGNOSIS — M2550 Pain in unspecified joint: Secondary | ICD-10-CM | POA: Diagnosis not present

## 2018-05-04 DIAGNOSIS — R2681 Unsteadiness on feet: Secondary | ICD-10-CM | POA: Diagnosis not present

## 2018-05-04 DIAGNOSIS — M25562 Pain in left knee: Secondary | ICD-10-CM | POA: Diagnosis not present

## 2018-05-04 DIAGNOSIS — D649 Anemia, unspecified: Secondary | ICD-10-CM | POA: Diagnosis not present

## 2018-05-04 DIAGNOSIS — I110 Hypertensive heart disease with heart failure: Secondary | ICD-10-CM | POA: Diagnosis not present

## 2018-05-04 DIAGNOSIS — R278 Other lack of coordination: Secondary | ICD-10-CM | POA: Diagnosis not present

## 2018-05-04 DIAGNOSIS — I5033 Acute on chronic diastolic (congestive) heart failure: Secondary | ICD-10-CM | POA: Diagnosis not present

## 2018-05-04 DIAGNOSIS — R142 Eructation: Secondary | ICD-10-CM | POA: Diagnosis not present

## 2018-05-04 DIAGNOSIS — M533 Sacrococcygeal disorders, not elsewhere classified: Secondary | ICD-10-CM | POA: Diagnosis not present

## 2018-05-04 DIAGNOSIS — Z79899 Other long term (current) drug therapy: Secondary | ICD-10-CM | POA: Diagnosis not present

## 2018-05-04 DIAGNOSIS — W19XXXA Unspecified fall, initial encounter: Secondary | ICD-10-CM | POA: Diagnosis not present

## 2018-05-04 DIAGNOSIS — S0083XA Contusion of other part of head, initial encounter: Secondary | ICD-10-CM | POA: Diagnosis not present

## 2018-05-04 DIAGNOSIS — G8929 Other chronic pain: Secondary | ICD-10-CM | POA: Diagnosis not present

## 2018-05-04 DIAGNOSIS — M25561 Pain in right knee: Secondary | ICD-10-CM | POA: Diagnosis not present

## 2018-05-04 DIAGNOSIS — R262 Difficulty in walking, not elsewhere classified: Secondary | ICD-10-CM | POA: Diagnosis not present

## 2018-05-04 DIAGNOSIS — S0990XA Unspecified injury of head, initial encounter: Secondary | ICD-10-CM | POA: Diagnosis not present

## 2018-05-04 DIAGNOSIS — I5043 Acute on chronic combined systolic (congestive) and diastolic (congestive) heart failure: Secondary | ICD-10-CM | POA: Diagnosis not present

## 2018-05-04 DIAGNOSIS — I10 Essential (primary) hypertension: Secondary | ICD-10-CM | POA: Diagnosis not present

## 2018-05-04 DIAGNOSIS — M6281 Muscle weakness (generalized): Secondary | ICD-10-CM | POA: Diagnosis not present

## 2018-05-04 DIAGNOSIS — R58 Hemorrhage, not elsewhere classified: Secondary | ICD-10-CM | POA: Diagnosis not present

## 2018-05-04 DIAGNOSIS — R41841 Cognitive communication deficit: Secondary | ICD-10-CM | POA: Diagnosis not present

## 2018-05-04 DIAGNOSIS — I4891 Unspecified atrial fibrillation: Secondary | ICD-10-CM | POA: Diagnosis not present

## 2018-05-04 DIAGNOSIS — I509 Heart failure, unspecified: Secondary | ICD-10-CM | POA: Diagnosis not present

## 2018-05-04 DIAGNOSIS — N183 Chronic kidney disease, stage 3 (moderate): Secondary | ICD-10-CM | POA: Diagnosis not present

## 2018-05-04 DIAGNOSIS — M25512 Pain in left shoulder: Secondary | ICD-10-CM | POA: Diagnosis not present

## 2018-05-04 DIAGNOSIS — Z8673 Personal history of transient ischemic attack (TIA), and cerebral infarction without residual deficits: Secondary | ICD-10-CM | POA: Diagnosis not present

## 2018-05-04 NOTE — ED Notes (Signed)
Ambulated Pt. In hallway with use of walker. Pt. HR remained consistently at 86 and SpO2 stayed between 90-92%. Pt. Did not require any assistance and denied any SOB or dizziness.

## 2018-05-04 NOTE — ED Provider Notes (Signed)
Patient seen/examined in the Emergency Department in conjunction with Midlevel Provider [facility] Patient reports fall at nursing facility because her knees gave out.  She reports pain in her head.  She is on Xarelto Exam : Bruising to forehead, no acute distress.  No signs of any acute traumatic injury to extremities. Plan: CT imaging negative, will discharge    Ripley Fraise, MD 05/04/18 [phone]

## 2018-05-04 NOTE — ED Notes (Signed)
Attempted to call report to [facility] and was unsuccessful.

## 2018-05-06 ENCOUNTER — Telehealth: Payer: Self-pay | Admitting: Internal Medicine

## 2018-05-06 NOTE — Telephone Encounter (Signed)
Left detailed message per DPR. Advised Pt to take tylenol for pain. Advised to avoid ibuprofen and brand names of ibuprofen.  Left this nurse name and # for call back if any further questions.

## 2018-05-06 NOTE — Telephone Encounter (Signed)
New Message:     Pt wants to know if she can take Advil or Tylenol with the rest of her medicine that she is taking please?

## 2018-05-09 DIAGNOSIS — G8929 Other chronic pain: Secondary | ICD-10-CM | POA: Diagnosis not present

## 2018-05-09 DIAGNOSIS — M533 Sacrococcygeal disorders, not elsewhere classified: Secondary | ICD-10-CM | POA: Diagnosis not present

## 2018-05-10 DIAGNOSIS — N183 Chronic kidney disease, stage 3 (moderate): Secondary | ICD-10-CM | POA: Diagnosis not present

## 2018-05-10 DIAGNOSIS — M25512 Pain in left shoulder: Secondary | ICD-10-CM | POA: Diagnosis not present

## 2018-05-10 DIAGNOSIS — M25562 Pain in left knee: Secondary | ICD-10-CM | POA: Diagnosis not present

## 2018-05-10 DIAGNOSIS — I509 Heart failure, unspecified: Secondary | ICD-10-CM | POA: Diagnosis not present

## 2018-05-10 DIAGNOSIS — M25561 Pain in right knee: Secondary | ICD-10-CM | POA: Diagnosis not present

## 2018-05-10 DIAGNOSIS — I129 Hypertensive chronic kidney disease with stage 1 through stage 4 chronic kidney disease, or unspecified chronic kidney disease: Secondary | ICD-10-CM | POA: Diagnosis not present

## 2018-05-10 DIAGNOSIS — I480 Paroxysmal atrial fibrillation: Secondary | ICD-10-CM | POA: Diagnosis not present

## 2018-05-11 ENCOUNTER — Encounter (HOSPITAL_BASED_OUTPATIENT_CLINIC_OR_DEPARTMENT_OTHER): Payer: Medicare Other | Attending: Physician Assistant

## 2018-05-16 DIAGNOSIS — I509 Heart failure, unspecified: Secondary | ICD-10-CM | POA: Diagnosis not present

## 2018-05-16 DIAGNOSIS — R079 Chest pain, unspecified: Secondary | ICD-10-CM | POA: Diagnosis not present

## 2018-05-16 DIAGNOSIS — I10 Essential (primary) hypertension: Secondary | ICD-10-CM | POA: Diagnosis not present

## 2018-05-18 DIAGNOSIS — R142 Eructation: Secondary | ICD-10-CM | POA: Diagnosis not present

## 2018-05-18 DIAGNOSIS — I10 Essential (primary) hypertension: Secondary | ICD-10-CM | POA: Diagnosis not present

## 2018-05-18 DIAGNOSIS — R079 Chest pain, unspecified: Secondary | ICD-10-CM | POA: Diagnosis not present

## 2018-05-18 DIAGNOSIS — I509 Heart failure, unspecified: Secondary | ICD-10-CM | POA: Diagnosis not present

## 2018-05-19 DIAGNOSIS — I10 Essential (primary) hypertension: Secondary | ICD-10-CM | POA: Diagnosis not present

## 2018-05-19 DIAGNOSIS — I4891 Unspecified atrial fibrillation: Secondary | ICD-10-CM | POA: Diagnosis not present

## 2018-05-19 DIAGNOSIS — D649 Anemia, unspecified: Secondary | ICD-10-CM | POA: Diagnosis not present

## 2018-05-19 DIAGNOSIS — I509 Heart failure, unspecified: Secondary | ICD-10-CM | POA: Diagnosis not present

## 2018-05-21 DIAGNOSIS — I110 Hypertensive heart disease with heart failure: Secondary | ICD-10-CM | POA: Diagnosis not present

## 2018-05-21 DIAGNOSIS — I484 Atypical atrial flutter: Secondary | ICD-10-CM | POA: Diagnosis not present

## 2018-05-21 DIAGNOSIS — I482 Chronic atrial fibrillation: Secondary | ICD-10-CM | POA: Diagnosis not present

## 2018-05-21 DIAGNOSIS — I69320 Aphasia following cerebral infarction: Secondary | ICD-10-CM | POA: Diagnosis not present

## 2018-05-21 DIAGNOSIS — M1991 Primary osteoarthritis, unspecified site: Secondary | ICD-10-CM | POA: Diagnosis not present

## 2018-05-21 DIAGNOSIS — I5043 Acute on chronic combined systolic (congestive) and diastolic (congestive) heart failure: Secondary | ICD-10-CM | POA: Diagnosis not present

## 2018-05-23 ENCOUNTER — Encounter (INDEPENDENT_AMBULATORY_CARE_PROVIDER_SITE_OTHER): Payer: Self-pay

## 2018-05-23 ENCOUNTER — Ambulatory Visit (INDEPENDENT_AMBULATORY_CARE_PROVIDER_SITE_OTHER): Payer: Medicare Other | Admitting: Nurse Practitioner

## 2018-05-23 ENCOUNTER — Encounter: Payer: Self-pay | Admitting: Nurse Practitioner

## 2018-05-23 VITALS — BP 110/50 | HR 65 | Ht 59.0 in | Wt 106.4 lb

## 2018-05-23 DIAGNOSIS — I480 Paroxysmal atrial fibrillation: Secondary | ICD-10-CM | POA: Diagnosis not present

## 2018-05-23 DIAGNOSIS — Z79899 Other long term (current) drug therapy: Secondary | ICD-10-CM

## 2018-05-23 DIAGNOSIS — I5032 Chronic diastolic (congestive) heart failure: Secondary | ICD-10-CM | POA: Diagnosis not present

## 2018-05-23 DIAGNOSIS — Z7901 Long term (current) use of anticoagulants: Secondary | ICD-10-CM | POA: Diagnosis not present

## 2018-05-23 MED ORDER — ISOSORBIDE MONONITRATE ER 30 MG PO TB24: 30.0000 mg | ORAL_TABLET | Freq: Every day | ORAL | 3 refills | Status: AC

## 2018-05-23 MED ORDER — LISINOPRIL 5 MG PO TABS: ORAL_TABLET | ORAL | 3 refills | Status: DC

## 2018-05-23 NOTE — Progress Notes (Signed)
CARDIOLOGY OFFICE NOTE  Date:  05/23/2018    [name] Date of Birth: December 09, 1927 Medical Record #[id number]  PCP:  Lajean Manes, MD  Cardiologist:  Atilano Median    Chief Complaint  Patient presents with  . Atrial Fibrillation    Post hospital visit - seen for Dr. Lovena Le    History of Present Illness: [name] is a 84 y.o. female who presents today for a post hospital visit. Seen for Dr. Lovena Le. She has primarily followed with me. I used to take care of her husband many years ago.   She has had a history of PAF - on chronic anticoagulation, chronic diastolic HF, HTN, HLD, OA, prior CVA, and RA.   Her anticoagulation was interrupted in 2018 due to profound anemia. She has had trouble tolerating CCB therapy in the past due to significant lower extremity edema. She has previously been on low dose amiodarone for her PAF to maintain NSR and prevent heart failure exacerbations.   I have not seen her since December of 2018. She has called recently with lots of complaints but could not arrange transportation here and thus had most of her care by her PCP. She has had another laceration to her leg.   She was then admitted in early June - found to be in AF with RVR. She required diuresis and was going to have cardioversion but converted on her own. Seen by Dr. Acie Fredrickson at that time.   Comes in today. Here alone. She is in a wheelchair. She is back home now - got home from [place] this past Friday - she now has help around the clock now from [facility]. Her sons are planning on her moving to [place] - she is not happy about this. She wants to stay in her home. She has had to have assistance with pretty much all of her ADL's. Apparently she is now having issues with her knees - her kneecaps are pulling away from her replacements - surgery was the only option - she is not going to go that route - she has significant pain. Her cardiac status seems stable. She was not able to get some of  her medicines refilled. She has not had any more chest pain. Does not sound like she is using NTG. Her rhythm is ok. She does not seem to be bothered by her breathing. She is more upset today about her living situation.   Past Medical History:  Diagnosis Date  . Arthritis    severe  . Atrial fibrillation ([place])   . Chronic diastolic CHF (congestive heart failure) ([place])   . Chronic insomnia   . Degenerative arthritis   . Essential hypertension   . Hypercholesteremia   . Memory deficit 05/24/2013  . Osteoporosis   . PAF (paroxysmal atrial fibrillation) ([place])    a. Intolerant to amiodarone, discontinued October 2012.  Marland Kitchen RLS (restless legs syndrome) 08/11/2016  . Sinus bradycardia   . Stroke [facility]) 2007   mini stroke or TIA with aphasia     Past Surgical History:  Procedure Laterality Date  . CATARACT EXTRACTION  2009   ou  . FEMUR FRACTURE SURGERY  8/12  . left knee  2001  . REPLACEMENT TOTAL KNEE BILATERAL Right 1999  . right intertrochanteric hip fracture status post ORIF  August 2012  . right shoulder replacement  2009     Medications: Current Meds  Medication Sig  . amiodarone (PACERONE) 200 MG tablet Take  1 tablet (200 mg total) by mouth daily.  . Cholecalciferol (VITAMIN D-3 PO) Take 1 capsule by mouth daily.  . Cyanocobalamin (VITAMIN B-12 PO) Take 1 tablet by mouth daily.  . diclofenac sodium (VOLTAREN) 1 % GEL Apply 4 g topically as needed (shoulder pain, severe pain).  . Eszopiclone 3 MG TABS Take 3 mg by mouth at bedtime. Take immediately before bedtime  . furosemide (LASIX) 40 MG tablet Take 1 tablet (40 mg total) by mouth daily.  . hydrOXYzine (ATARAX/VISTARIL) 10 MG tablet Take 1 tablet (10 mg total) by mouth 3 (three) times daily as needed for anxiety.  . isosorbide mononitrate (IMDUR) 30 MG 24 hr tablet Take 1 tablet (30 mg total) by mouth daily.  Marland Kitchen lisinopril (PRINIVIL,ZESTRIL) 5 MG tablet take one by mouth daily  . Melatonin 3 MG TABS Take 3 tablets (9 mg  total) by mouth at bedtime as needed (for sleep).  . metoprolol succinate (TOPROL-XL) 25 MG 24 hr tablet Take 0.5 tablets (12.5 mg total) by mouth daily.  . nitroGLYCERIN (NITROSTAT) 0.4 MG SL tablet DISSOLVE 1 TABLET UNDER THE TONGUE EVERY 5 MINUTES AS NEEDED FOR CHEST PAIN  . RESTASIS 0.05 % ophthalmic emulsion Place 1 drop into both eyes 2 (two) times daily.  . rivastigmine (EXELON) 4.5 MG capsule TAKE 1 CAPSULE(4.5 MG) BY MOUTH TWICE DAILY  . traMADol (ULTRAM) 50 MG tablet Take 100 mg by mouth every 6 (six) hours as needed.  . traMADol (ULTRAM) 50 MG tablet Take 2 tablets (100 mg total) by mouth every 6 (six) hours as needed for severe pain.  Marland Kitchen XARELTO 15 MG TABS tablet TAKE 1 TABLET(15 MG) BY MOUTH DAILY WITH SUPPER  . [DISCONTINUED] isosorbide mononitrate (IMDUR) 30 MG 24 hr tablet take one tablet by mouth daily  . [DISCONTINUED] lisinopril (PRINIVIL,ZESTRIL) 5 MG tablet take one by mouth daily     Allergies: Allergies  Allergen Reactions  . Codeine Nausea Only    Social History: The patient  reports that she has never smoked. She has never used smokeless tobacco. She reports that she drinks about 4.2 oz of alcohol per week. She reports that she does not use drugs.   Family History: The patient's family history includes Cancer in her father, sister, sister, and son; Cancer - Colon in her father; Coronary artery disease in her unknown relative; Dementia in her mother; Stroke in her mother.   Review of Systems: Please see the history of present illness.   Otherwise, the review of systems is positive for none.   All other systems are reviewed and negative.   Physical Exam: VS:  BP (!) 110/50 (BP Location: Left Arm, Patient Position: Sitting, Cuff Size: Normal)   Pulse 65   Ht 4\' 11"  (1.499 m)   Wt 106 lb 6.4 oz (48.3 kg)   BMI 21.49 kg/m  .  BMI Body mass index is 21.49 kg/m.  Wt Readings from Last 3 Encounters:  05/23/18 106 lb 6.4 oz (48.3 kg)  05/03/18 110 lb (49.9 kg)    04/11/18 114 lb 12.8 oz (52.1 kg)    General: Pleasant. Elderly. Frail but alert and in no acute distress. Mentally she seems fine.  She is in a wheelchair. Her weight is down. She weighed 122 pounds the last time I saw her in December of 2018 - now at 106#.  HEENT: Normal.  Neck: Supple, no JVD, carotid bruits, or masses noted.  Cardiac: Regular rate and rhythm. No murmurs, rubs, or gallops. Trace edema.  Respiratory:  Lungs are clear to auscultation bilaterally with normal work of breathing.  GI: Soft and nontender.  MS: Right leg inverted. Lots of arthritic changes.  Gait not tested.  Skin: Warm and dry. Color is normal.  Neuro:  Strength and sensation are intact and no gross focal deficits noted.  Psych: Alert, appropriate and with normal affect.   LABORATORY DATA:  EKG:  EKG is ordered today. This demonstrates NSR - HR is 64.   Lab Results  Component Value Date   WBC 5.6 04/11/2018   HGB 11.1 (L) 04/11/2018   HCT 35.2 (L) 04/11/2018   PLT 153 04/11/2018   GLUCOSE 104 (H) 04/11/2018   ALT 15 10/13/2017   AST 24 10/13/2017   NA 137 04/11/2018   K 4.4 04/11/2018   CL 83 (L) 04/11/2018   CREATININE 1.14 (H) 04/11/2018   BUN 34 (H) 04/11/2018   CO2 43 (H) 04/11/2018   TSH 0.837 04/06/2018   INR 1.26 05/26/2016     BNP (last 3 results) Recent Labs    04/06/18 0240  BNP 388.3*    ProBNP (last 3 results) No results for input(s): PROBNP in the last 8760 hours.   Other Studies Reviewed Today:  Echocardiogram 04/06/2018 Study Conclusions  - Left ventricle: The cavity size was normal. Wall thickness was increased in a pattern of mild LVH. Systolic function was mildly to moderately reduced. The estimated ejection fraction was in the range of 40% to 45%. Diffuse hypokinesis. The study is not technically sufficient to allow evaluation of LV diastolic function. - Aortic valve: Sclerosis without stenosis. There was trivial regurgitation. - Mitral valve:  Mildly thickened leaflets . There was mild regurgitation. - Left atrium: Severely dilated. - Atrial septum: No defect or patent foramen ovale was identified. - Tricuspid valve: There was trivial regurgitation. - Pulmonary arteries: PA peak pressure: 28 mm Hg (S). - Inferior vena cava: The vessel was normal in size. The respirophasic diameter changes were in the normal range (>= 50%), consistent with normal central venous pressure.  Impressions:  - Compared to a prior study in 2016, the LVEF is lower at 40-45% with global hypokinesis, trivial AI, mild MR and severe LAE.     Assessment/Plan:  1. PAF - recent exacerbation of RVR - placed on higher doses of amiodarone short term - back to 200 mg a day. Remains in NSR.   2. History of bradycardia - HR ok today.   3. Chronic combined systolic and diastolic HF - EF now lower at 40 to 45% ?if this was related to her AF with RVR and was more of a tachycardia mediated event - may need to consider repeating her echo in a few months - I would like to keep her on the ACE - recheck lab today. Concern for worsening kidney function - may have been to overdiuresis - rechecking lab today. BP ok at this time.   4. Chest pain - the discharge summary noted possible ischemic work up if EF failed to improve but could consider medical management given her frail state - I would favor conservative management - we will keep her on the long term nitrate.   5. RA - quite debilitating for her.   6. Leg wound - not discussed today.   7. Advanced age - she is clearly getting more difficult to be mobile - this will only be progressive. She is quite upset about upcoming living situations. She is not even able to use the restroom without  help - it may be that she actually needs skilled care versus staying at home with around the clock care. She asked me to speak to her son. I spent over 60 minutes of time with her today - primarily discussing this issue.    Current medicines are reviewed with the patient today.  The patient does not have concerns regarding medicines other than what has been noted above.  The following changes have been made:  See above.  Labs/ tests ordered today include:    Orders Placed This Encounter  Procedures  . Basic metabolic panel  . CBC  . EKG 12-Lead     Disposition:   FU with me in 3 months.   Patient is agreeable to this plan and will call if any problems develop in the interim.   SignedTruitt Merle, NP  05/23/2018 12:51 PM  [facility] [address] [place], [place]  [address] Phone: [phone] Fax: [phone]

## 2018-05-23 NOTE — Patient Instructions (Addendum)
We will be checking the following labs today - BMET and CBC   Medication Instructions:    Continue with your current medicines.   We are going to stay on the Isosorbide and the Lisinopril - this is at the pharmacy    Testing/Procedures To Be Arranged:  N/A  Follow-Up:   See me in about 3 months    Other Special Instructions:   N/A    If you need a refill on your cardiac medications before your next appointment, please call your pharmacy.   Call the [place] office at [phone] if you have any questions, problems or concerns.

## 2018-05-24 DIAGNOSIS — I482 Chronic atrial fibrillation: Secondary | ICD-10-CM | POA: Diagnosis not present

## 2018-05-24 DIAGNOSIS — I110 Hypertensive heart disease with heart failure: Secondary | ICD-10-CM | POA: Diagnosis not present

## 2018-05-24 DIAGNOSIS — I484 Atypical atrial flutter: Secondary | ICD-10-CM | POA: Diagnosis not present

## 2018-05-24 DIAGNOSIS — M1991 Primary osteoarthritis, unspecified site: Secondary | ICD-10-CM | POA: Diagnosis not present

## 2018-05-24 DIAGNOSIS — I69320 Aphasia following cerebral infarction: Secondary | ICD-10-CM | POA: Diagnosis not present

## 2018-05-24 DIAGNOSIS — I5043 Acute on chronic combined systolic (congestive) and diastolic (congestive) heart failure: Secondary | ICD-10-CM | POA: Diagnosis not present

## 2018-05-24 LAB — BASIC METABOLIC PANEL
BUN/Creatinine Ratio: 26 (ref 12–28)
BUN: 27 mg/dL (ref 8–27)
CO2: 28 mmol/L (ref 20–29)
Calcium: 9.6 mg/dL (ref 8.7–10.3)
Chloride: 90 mmol/L — ABNORMAL LOW (ref 96–106)
Creatinine, Ser: 1.04 mg/dL — ABNORMAL HIGH (ref 0.57–1.00)
GFR calc Af Amer: 56 mL/min/{1.73_m2} — ABNORMAL LOW (ref >59)
GFR calc non Af Amer: 48 mL/min/{1.73_m2} — ABNORMAL LOW (ref >59)
Glucose: 87 mg/dL (ref 65–99)
Potassium: 3.8 mmol/L (ref 3.5–5.2)
Sodium: 136 mmol/L (ref 134–144)

## 2018-05-24 LAB — CBC
Hematocrit: 33.1 % — ABNORMAL LOW (ref 34.0–46.6)
Hemoglobin: 10.8 g/dL — ABNORMAL LOW (ref 11.1–15.9)
MCH: 30.2 pg (ref 26.6–33.0)
MCHC: 32.6 g/dL (ref 31.5–35.7)
MCV: 93 fL (ref 79–97)
Platelets: 220 10*3/uL (ref 150–450)
RBC: 3.58 x10E6/uL — ABNORMAL LOW (ref 3.77–5.28)
RDW: 15.4 % (ref 12.3–15.4)
WBC: 6.7 10*3/uL (ref 3.4–10.8)

## 2018-05-25 ENCOUNTER — Other Ambulatory Visit: Payer: Self-pay | Admitting: *Deleted

## 2018-05-25 ENCOUNTER — Telehealth: Payer: Self-pay | Admitting: Nurse Practitioner

## 2018-05-25 DIAGNOSIS — I69320 Aphasia following cerebral infarction: Secondary | ICD-10-CM | POA: Diagnosis not present

## 2018-05-25 DIAGNOSIS — I484 Atypical atrial flutter: Secondary | ICD-10-CM | POA: Diagnosis not present

## 2018-05-25 DIAGNOSIS — I482 Chronic atrial fibrillation: Secondary | ICD-10-CM | POA: Diagnosis not present

## 2018-05-25 DIAGNOSIS — I110 Hypertensive heart disease with heart failure: Secondary | ICD-10-CM | POA: Diagnosis not present

## 2018-05-25 DIAGNOSIS — I5043 Acute on chronic combined systolic (congestive) and diastolic (congestive) heart failure: Secondary | ICD-10-CM | POA: Diagnosis not present

## 2018-05-25 DIAGNOSIS — M1991 Primary osteoarthritis, unspecified site: Secondary | ICD-10-CM | POA: Diagnosis not present

## 2018-05-25 MED ORDER — AMIODARONE HCL 200 MG PO TABS: 200.0000 mg | ORAL_TABLET | Freq: Every day | ORAL | 1 refills | Status: DC

## 2018-05-25 MED ORDER — METOPROLOL SUCCINATE ER 25 MG PO TB24: 12.5000 mg | ORAL_TABLET | Freq: Every day | ORAL | 1 refills | Status: DC

## 2018-05-25 NOTE — Telephone Encounter (Signed)
S/w pt's HH Seth Bake to get pt's medication clarified. Sent in scripts for amiodarone one tablet (200 mg ) daily and toprol xl one half tablet (12.5 mg) daily to requested pharmacy.

## 2018-05-25 NOTE — Telephone Encounter (Signed)
Levada Dy stated she went to pt's home and pt didn't have any metoprolol. This was one of her discharge medications and amiodarone mg is not clarified what pt should be taking. Please advise is metoprolol is needed to be order.  Please call [facility] at home.

## 2018-05-27 DIAGNOSIS — I484 Atypical atrial flutter: Secondary | ICD-10-CM | POA: Diagnosis not present

## 2018-05-27 DIAGNOSIS — I110 Hypertensive heart disease with heart failure: Secondary | ICD-10-CM | POA: Diagnosis not present

## 2018-05-27 DIAGNOSIS — I5043 Acute on chronic combined systolic (congestive) and diastolic (congestive) heart failure: Secondary | ICD-10-CM | POA: Diagnosis not present

## 2018-05-27 DIAGNOSIS — I69320 Aphasia following cerebral infarction: Secondary | ICD-10-CM | POA: Diagnosis not present

## 2018-05-27 DIAGNOSIS — I482 Chronic atrial fibrillation: Secondary | ICD-10-CM | POA: Diagnosis not present

## 2018-05-27 DIAGNOSIS — M1991 Primary osteoarthritis, unspecified site: Secondary | ICD-10-CM | POA: Diagnosis not present

## 2018-05-30 DIAGNOSIS — I484 Atypical atrial flutter: Secondary | ICD-10-CM | POA: Diagnosis not present

## 2018-05-30 DIAGNOSIS — I482 Chronic atrial fibrillation: Secondary | ICD-10-CM | POA: Diagnosis not present

## 2018-05-30 DIAGNOSIS — I5043 Acute on chronic combined systolic (congestive) and diastolic (congestive) heart failure: Secondary | ICD-10-CM | POA: Diagnosis not present

## 2018-05-30 DIAGNOSIS — I110 Hypertensive heart disease with heart failure: Secondary | ICD-10-CM | POA: Diagnosis not present

## 2018-05-30 DIAGNOSIS — I69320 Aphasia following cerebral infarction: Secondary | ICD-10-CM | POA: Diagnosis not present

## 2018-05-30 DIAGNOSIS — M1991 Primary osteoarthritis, unspecified site: Secondary | ICD-10-CM | POA: Diagnosis not present

## 2018-05-30 DIAGNOSIS — S82033A Displaced transverse fracture of unspecified patella, initial encounter for closed fracture: Secondary | ICD-10-CM | POA: Diagnosis not present

## 2018-05-31 DIAGNOSIS — I110 Hypertensive heart disease with heart failure: Secondary | ICD-10-CM | POA: Diagnosis not present

## 2018-05-31 DIAGNOSIS — I482 Chronic atrial fibrillation: Secondary | ICD-10-CM | POA: Diagnosis not present

## 2018-05-31 DIAGNOSIS — I484 Atypical atrial flutter: Secondary | ICD-10-CM | POA: Diagnosis not present

## 2018-05-31 DIAGNOSIS — I69320 Aphasia following cerebral infarction: Secondary | ICD-10-CM | POA: Diagnosis not present

## 2018-05-31 DIAGNOSIS — I5043 Acute on chronic combined systolic (congestive) and diastolic (congestive) heart failure: Secondary | ICD-10-CM | POA: Diagnosis not present

## 2018-05-31 DIAGNOSIS — M1991 Primary osteoarthritis, unspecified site: Secondary | ICD-10-CM | POA: Diagnosis not present

## 2018-06-03 DIAGNOSIS — I110 Hypertensive heart disease with heart failure: Secondary | ICD-10-CM | POA: Diagnosis not present

## 2018-06-03 DIAGNOSIS — M1991 Primary osteoarthritis, unspecified site: Secondary | ICD-10-CM | POA: Diagnosis not present

## 2018-06-03 DIAGNOSIS — I482 Chronic atrial fibrillation: Secondary | ICD-10-CM | POA: Diagnosis not present

## 2018-06-03 DIAGNOSIS — I5043 Acute on chronic combined systolic (congestive) and diastolic (congestive) heart failure: Secondary | ICD-10-CM | POA: Diagnosis not present

## 2018-06-03 DIAGNOSIS — I69320 Aphasia following cerebral infarction: Secondary | ICD-10-CM | POA: Diagnosis not present

## 2018-06-03 DIAGNOSIS — I484 Atypical atrial flutter: Secondary | ICD-10-CM | POA: Diagnosis not present

## 2018-06-04 ENCOUNTER — Other Ambulatory Visit: Payer: Self-pay | Admitting: Neurology

## 2018-06-06 DIAGNOSIS — I484 Atypical atrial flutter: Secondary | ICD-10-CM | POA: Diagnosis not present

## 2018-06-06 DIAGNOSIS — I482 Chronic atrial fibrillation: Secondary | ICD-10-CM | POA: Diagnosis not present

## 2018-06-06 DIAGNOSIS — M1991 Primary osteoarthritis, unspecified site: Secondary | ICD-10-CM | POA: Diagnosis not present

## 2018-06-06 DIAGNOSIS — I69320 Aphasia following cerebral infarction: Secondary | ICD-10-CM | POA: Diagnosis not present

## 2018-06-06 DIAGNOSIS — I110 Hypertensive heart disease with heart failure: Secondary | ICD-10-CM | POA: Diagnosis not present

## 2018-06-06 DIAGNOSIS — I5043 Acute on chronic combined systolic (congestive) and diastolic (congestive) heart failure: Secondary | ICD-10-CM | POA: Diagnosis not present

## 2018-06-08 ENCOUNTER — Ambulatory Visit: Payer: Medicare Other | Admitting: Neurology

## 2018-06-08 DIAGNOSIS — I110 Hypertensive heart disease with heart failure: Secondary | ICD-10-CM | POA: Diagnosis not present

## 2018-06-08 DIAGNOSIS — M1991 Primary osteoarthritis, unspecified site: Secondary | ICD-10-CM | POA: Diagnosis not present

## 2018-06-08 DIAGNOSIS — I484 Atypical atrial flutter: Secondary | ICD-10-CM | POA: Diagnosis not present

## 2018-06-08 DIAGNOSIS — I482 Chronic atrial fibrillation: Secondary | ICD-10-CM | POA: Diagnosis not present

## 2018-06-08 DIAGNOSIS — I69320 Aphasia following cerebral infarction: Secondary | ICD-10-CM | POA: Diagnosis not present

## 2018-06-08 DIAGNOSIS — I5043 Acute on chronic combined systolic (congestive) and diastolic (congestive) heart failure: Secondary | ICD-10-CM | POA: Diagnosis not present

## 2018-06-10 DIAGNOSIS — I69320 Aphasia following cerebral infarction: Secondary | ICD-10-CM | POA: Diagnosis not present

## 2018-06-10 DIAGNOSIS — I110 Hypertensive heart disease with heart failure: Secondary | ICD-10-CM | POA: Diagnosis not present

## 2018-06-10 DIAGNOSIS — I5043 Acute on chronic combined systolic (congestive) and diastolic (congestive) heart failure: Secondary | ICD-10-CM | POA: Diagnosis not present

## 2018-06-10 DIAGNOSIS — I482 Chronic atrial fibrillation: Secondary | ICD-10-CM | POA: Diagnosis not present

## 2018-06-10 DIAGNOSIS — I484 Atypical atrial flutter: Secondary | ICD-10-CM | POA: Diagnosis not present

## 2018-06-10 DIAGNOSIS — M1991 Primary osteoarthritis, unspecified site: Secondary | ICD-10-CM | POA: Diagnosis not present

## 2018-06-14 DIAGNOSIS — H52223 Regular astigmatism, bilateral: Secondary | ICD-10-CM | POA: Diagnosis not present

## 2018-06-14 DIAGNOSIS — H35033 Hypertensive retinopathy, bilateral: Secondary | ICD-10-CM | POA: Diagnosis not present

## 2018-06-14 DIAGNOSIS — H353133 Nonexudative age-related macular degeneration, bilateral, advanced atrophic without subfoveal involvement: Secondary | ICD-10-CM | POA: Diagnosis not present

## 2018-06-14 DIAGNOSIS — H524 Presbyopia: Secondary | ICD-10-CM | POA: Diagnosis not present

## 2018-06-14 DIAGNOSIS — H04123 Dry eye syndrome of bilateral lacrimal glands: Secondary | ICD-10-CM | POA: Diagnosis not present

## 2018-06-15 DIAGNOSIS — I482 Chronic atrial fibrillation: Secondary | ICD-10-CM | POA: Diagnosis not present

## 2018-06-15 DIAGNOSIS — I484 Atypical atrial flutter: Secondary | ICD-10-CM | POA: Diagnosis not present

## 2018-06-15 DIAGNOSIS — I110 Hypertensive heart disease with heart failure: Secondary | ICD-10-CM | POA: Diagnosis not present

## 2018-06-15 DIAGNOSIS — M1991 Primary osteoarthritis, unspecified site: Secondary | ICD-10-CM | POA: Diagnosis not present

## 2018-06-15 DIAGNOSIS — I69320 Aphasia following cerebral infarction: Secondary | ICD-10-CM | POA: Diagnosis not present

## 2018-06-15 DIAGNOSIS — I5043 Acute on chronic combined systolic (congestive) and diastolic (congestive) heart failure: Secondary | ICD-10-CM | POA: Diagnosis not present

## 2018-06-17 DIAGNOSIS — M1991 Primary osteoarthritis, unspecified site: Secondary | ICD-10-CM | POA: Diagnosis not present

## 2018-06-17 DIAGNOSIS — I5043 Acute on chronic combined systolic (congestive) and diastolic (congestive) heart failure: Secondary | ICD-10-CM | POA: Diagnosis not present

## 2018-06-17 DIAGNOSIS — I110 Hypertensive heart disease with heart failure: Secondary | ICD-10-CM | POA: Diagnosis not present

## 2018-06-17 DIAGNOSIS — I482 Chronic atrial fibrillation: Secondary | ICD-10-CM | POA: Diagnosis not present

## 2018-06-17 DIAGNOSIS — I484 Atypical atrial flutter: Secondary | ICD-10-CM | POA: Diagnosis not present

## 2018-06-17 DIAGNOSIS — I69320 Aphasia following cerebral infarction: Secondary | ICD-10-CM | POA: Diagnosis not present

## 2018-06-20 ENCOUNTER — Encounter (INDEPENDENT_AMBULATORY_CARE_PROVIDER_SITE_OTHER): Payer: Medicare Other | Admitting: Ophthalmology

## 2018-06-20 DIAGNOSIS — M1991 Primary osteoarthritis, unspecified site: Secondary | ICD-10-CM | POA: Diagnosis not present

## 2018-06-20 DIAGNOSIS — I69320 Aphasia following cerebral infarction: Secondary | ICD-10-CM | POA: Diagnosis not present

## 2018-06-20 DIAGNOSIS — I484 Atypical atrial flutter: Secondary | ICD-10-CM | POA: Diagnosis not present

## 2018-06-20 DIAGNOSIS — I110 Hypertensive heart disease with heart failure: Secondary | ICD-10-CM | POA: Diagnosis not present

## 2018-06-20 DIAGNOSIS — I5043 Acute on chronic combined systolic (congestive) and diastolic (congestive) heart failure: Secondary | ICD-10-CM | POA: Diagnosis not present

## 2018-06-20 DIAGNOSIS — I482 Chronic atrial fibrillation: Secondary | ICD-10-CM | POA: Diagnosis not present

## 2018-06-21 DIAGNOSIS — I484 Atypical atrial flutter: Secondary | ICD-10-CM | POA: Diagnosis not present

## 2018-06-21 DIAGNOSIS — I110 Hypertensive heart disease with heart failure: Secondary | ICD-10-CM | POA: Diagnosis not present

## 2018-06-21 DIAGNOSIS — M1991 Primary osteoarthritis, unspecified site: Secondary | ICD-10-CM | POA: Diagnosis not present

## 2018-06-21 DIAGNOSIS — I5043 Acute on chronic combined systolic (congestive) and diastolic (congestive) heart failure: Secondary | ICD-10-CM | POA: Diagnosis not present

## 2018-06-21 DIAGNOSIS — I69320 Aphasia following cerebral infarction: Secondary | ICD-10-CM | POA: Diagnosis not present

## 2018-06-21 DIAGNOSIS — I482 Chronic atrial fibrillation: Secondary | ICD-10-CM | POA: Diagnosis not present

## 2018-06-22 DIAGNOSIS — I69320 Aphasia following cerebral infarction: Secondary | ICD-10-CM | POA: Diagnosis not present

## 2018-06-22 DIAGNOSIS — M1991 Primary osteoarthritis, unspecified site: Secondary | ICD-10-CM | POA: Diagnosis not present

## 2018-06-22 DIAGNOSIS — I484 Atypical atrial flutter: Secondary | ICD-10-CM | POA: Diagnosis not present

## 2018-06-22 DIAGNOSIS — I110 Hypertensive heart disease with heart failure: Secondary | ICD-10-CM | POA: Diagnosis not present

## 2018-06-22 DIAGNOSIS — I5043 Acute on chronic combined systolic (congestive) and diastolic (congestive) heart failure: Secondary | ICD-10-CM | POA: Diagnosis not present

## 2018-06-22 DIAGNOSIS — I482 Chronic atrial fibrillation: Secondary | ICD-10-CM | POA: Diagnosis not present

## 2018-06-24 DIAGNOSIS — S8012XA Contusion of left lower leg, initial encounter: Secondary | ICD-10-CM | POA: Diagnosis not present

## 2018-06-24 DIAGNOSIS — R600 Localized edema: Secondary | ICD-10-CM | POA: Diagnosis not present

## 2018-06-27 DIAGNOSIS — I69320 Aphasia following cerebral infarction: Secondary | ICD-10-CM | POA: Diagnosis not present

## 2018-06-27 DIAGNOSIS — I484 Atypical atrial flutter: Secondary | ICD-10-CM | POA: Diagnosis not present

## 2018-06-27 DIAGNOSIS — I482 Chronic atrial fibrillation: Secondary | ICD-10-CM | POA: Diagnosis not present

## 2018-06-27 DIAGNOSIS — I5043 Acute on chronic combined systolic (congestive) and diastolic (congestive) heart failure: Secondary | ICD-10-CM | POA: Diagnosis not present

## 2018-06-27 DIAGNOSIS — I110 Hypertensive heart disease with heart failure: Secondary | ICD-10-CM | POA: Diagnosis not present

## 2018-06-27 DIAGNOSIS — M1991 Primary osteoarthritis, unspecified site: Secondary | ICD-10-CM | POA: Diagnosis not present

## 2018-06-29 DIAGNOSIS — S82033D Displaced transverse fracture of unspecified patella, subsequent encounter for closed fracture with routine healing: Secondary | ICD-10-CM | POA: Diagnosis not present

## 2018-07-01 DIAGNOSIS — I69320 Aphasia following cerebral infarction: Secondary | ICD-10-CM | POA: Diagnosis not present

## 2018-07-01 DIAGNOSIS — I110 Hypertensive heart disease with heart failure: Secondary | ICD-10-CM | POA: Diagnosis not present

## 2018-07-01 DIAGNOSIS — I5043 Acute on chronic combined systolic (congestive) and diastolic (congestive) heart failure: Secondary | ICD-10-CM | POA: Diagnosis not present

## 2018-07-01 DIAGNOSIS — M1991 Primary osteoarthritis, unspecified site: Secondary | ICD-10-CM | POA: Diagnosis not present

## 2018-07-01 DIAGNOSIS — I484 Atypical atrial flutter: Secondary | ICD-10-CM | POA: Diagnosis not present

## 2018-07-01 DIAGNOSIS — I482 Chronic atrial fibrillation: Secondary | ICD-10-CM | POA: Diagnosis not present

## 2018-07-06 DIAGNOSIS — I5043 Acute on chronic combined systolic (congestive) and diastolic (congestive) heart failure: Secondary | ICD-10-CM | POA: Diagnosis not present

## 2018-07-06 DIAGNOSIS — I110 Hypertensive heart disease with heart failure: Secondary | ICD-10-CM | POA: Diagnosis not present

## 2018-07-06 DIAGNOSIS — M1991 Primary osteoarthritis, unspecified site: Secondary | ICD-10-CM | POA: Diagnosis not present

## 2018-07-06 DIAGNOSIS — I69320 Aphasia following cerebral infarction: Secondary | ICD-10-CM | POA: Diagnosis not present

## 2018-07-06 DIAGNOSIS — I482 Chronic atrial fibrillation: Secondary | ICD-10-CM | POA: Diagnosis not present

## 2018-07-06 DIAGNOSIS — I484 Atypical atrial flutter: Secondary | ICD-10-CM | POA: Diagnosis not present

## 2018-07-11 DIAGNOSIS — I480 Paroxysmal atrial fibrillation: Secondary | ICD-10-CM | POA: Diagnosis not present

## 2018-07-11 DIAGNOSIS — M25561 Pain in right knee: Secondary | ICD-10-CM | POA: Diagnosis not present

## 2018-07-11 DIAGNOSIS — I129 Hypertensive chronic kidney disease with stage 1 through stage 4 chronic kidney disease, or unspecified chronic kidney disease: Secondary | ICD-10-CM | POA: Diagnosis not present

## 2018-07-11 DIAGNOSIS — Z79899 Other long term (current) drug therapy: Secondary | ICD-10-CM | POA: Diagnosis not present

## 2018-07-11 DIAGNOSIS — G8929 Other chronic pain: Secondary | ICD-10-CM | POA: Diagnosis not present

## 2018-07-11 DIAGNOSIS — I509 Heart failure, unspecified: Secondary | ICD-10-CM | POA: Diagnosis not present

## 2018-07-11 DIAGNOSIS — Z23 Encounter for immunization: Secondary | ICD-10-CM | POA: Diagnosis not present

## 2018-07-11 DIAGNOSIS — N183 Chronic kidney disease, stage 3 (moderate): Secondary | ICD-10-CM | POA: Diagnosis not present

## 2018-07-11 DIAGNOSIS — M25562 Pain in left knee: Secondary | ICD-10-CM | POA: Diagnosis not present

## 2018-07-13 DIAGNOSIS — I5043 Acute on chronic combined systolic (congestive) and diastolic (congestive) heart failure: Secondary | ICD-10-CM | POA: Diagnosis not present

## 2018-07-13 DIAGNOSIS — I484 Atypical atrial flutter: Secondary | ICD-10-CM | POA: Diagnosis not present

## 2018-07-13 DIAGNOSIS — I482 Chronic atrial fibrillation: Secondary | ICD-10-CM | POA: Diagnosis not present

## 2018-07-13 DIAGNOSIS — I69320 Aphasia following cerebral infarction: Secondary | ICD-10-CM | POA: Diagnosis not present

## 2018-07-13 DIAGNOSIS — M1991 Primary osteoarthritis, unspecified site: Secondary | ICD-10-CM | POA: Diagnosis not present

## 2018-07-13 DIAGNOSIS — I110 Hypertensive heart disease with heart failure: Secondary | ICD-10-CM | POA: Diagnosis not present

## 2018-07-19 ENCOUNTER — Ambulatory Visit: Payer: Medicare Other | Admitting: Nurse Practitioner

## 2018-08-02 DIAGNOSIS — I872 Venous insufficiency (chronic) (peripheral): Secondary | ICD-10-CM | POA: Diagnosis not present

## 2018-08-02 DIAGNOSIS — L89612 Pressure ulcer of right heel, stage 2: Secondary | ICD-10-CM | POA: Diagnosis not present

## 2018-08-02 DIAGNOSIS — Z79899 Other long term (current) drug therapy: Secondary | ICD-10-CM | POA: Diagnosis not present

## 2018-08-02 DIAGNOSIS — I129 Hypertensive chronic kidney disease with stage 1 through stage 4 chronic kidney disease, or unspecified chronic kidney disease: Secondary | ICD-10-CM | POA: Diagnosis not present

## 2018-08-02 DIAGNOSIS — I4891 Unspecified atrial fibrillation: Secondary | ICD-10-CM | POA: Diagnosis not present

## 2018-08-02 DIAGNOSIS — N183 Chronic kidney disease, stage 3 (moderate): Secondary | ICD-10-CM | POA: Diagnosis not present

## 2018-08-07 DIAGNOSIS — M47816 Spondylosis without myelopathy or radiculopathy, lumbar region: Secondary | ICD-10-CM | POA: Diagnosis not present

## 2018-08-07 DIAGNOSIS — M810 Age-related osteoporosis without current pathological fracture: Secondary | ICD-10-CM | POA: Diagnosis not present

## 2018-08-07 DIAGNOSIS — M48061 Spinal stenosis, lumbar region without neurogenic claudication: Secondary | ICD-10-CM | POA: Diagnosis not present

## 2018-08-07 DIAGNOSIS — F419 Anxiety disorder, unspecified: Secondary | ICD-10-CM | POA: Diagnosis not present

## 2018-08-07 DIAGNOSIS — D509 Iron deficiency anemia, unspecified: Secondary | ICD-10-CM | POA: Diagnosis not present

## 2018-08-07 DIAGNOSIS — Z9181 History of falling: Secondary | ICD-10-CM | POA: Diagnosis not present

## 2018-08-07 DIAGNOSIS — I480 Paroxysmal atrial fibrillation: Secondary | ICD-10-CM | POA: Diagnosis not present

## 2018-08-07 DIAGNOSIS — M25561 Pain in right knee: Secondary | ICD-10-CM | POA: Diagnosis not present

## 2018-08-07 DIAGNOSIS — N183 Chronic kidney disease, stage 3 (moderate): Secondary | ICD-10-CM | POA: Diagnosis not present

## 2018-08-07 DIAGNOSIS — M1991 Primary osteoarthritis, unspecified site: Secondary | ICD-10-CM | POA: Diagnosis not present

## 2018-08-07 DIAGNOSIS — I130 Hypertensive heart and chronic kidney disease with heart failure and stage 1 through stage 4 chronic kidney disease, or unspecified chronic kidney disease: Secondary | ICD-10-CM | POA: Diagnosis not present

## 2018-08-07 DIAGNOSIS — Z7901 Long term (current) use of anticoagulants: Secondary | ICD-10-CM | POA: Diagnosis not present

## 2018-08-07 DIAGNOSIS — M25562 Pain in left knee: Secondary | ICD-10-CM | POA: Diagnosis not present

## 2018-08-07 DIAGNOSIS — Z8673 Personal history of transient ischemic attack (TIA), and cerebral infarction without residual deficits: Secondary | ICD-10-CM | POA: Diagnosis not present

## 2018-08-07 DIAGNOSIS — I5030 Unspecified diastolic (congestive) heart failure: Secondary | ICD-10-CM | POA: Diagnosis not present

## 2018-08-07 DIAGNOSIS — L89612 Pressure ulcer of right heel, stage 2: Secondary | ICD-10-CM | POA: Diagnosis not present

## 2018-08-07 DIAGNOSIS — H9193 Unspecified hearing loss, bilateral: Secondary | ICD-10-CM | POA: Diagnosis not present

## 2018-08-07 DIAGNOSIS — B0229 Other postherpetic nervous system involvement: Secondary | ICD-10-CM | POA: Diagnosis not present

## 2018-08-07 DIAGNOSIS — I872 Venous insufficiency (chronic) (peripheral): Secondary | ICD-10-CM | POA: Diagnosis not present

## 2018-08-07 DIAGNOSIS — N3281 Overactive bladder: Secondary | ICD-10-CM | POA: Diagnosis not present

## 2018-08-07 DIAGNOSIS — E7800 Pure hypercholesterolemia, unspecified: Secondary | ICD-10-CM | POA: Diagnosis not present

## 2018-08-09 DIAGNOSIS — I480 Paroxysmal atrial fibrillation: Secondary | ICD-10-CM | POA: Diagnosis not present

## 2018-08-09 DIAGNOSIS — I5030 Unspecified diastolic (congestive) heart failure: Secondary | ICD-10-CM | POA: Diagnosis not present

## 2018-08-09 DIAGNOSIS — I872 Venous insufficiency (chronic) (peripheral): Secondary | ICD-10-CM | POA: Diagnosis not present

## 2018-08-09 DIAGNOSIS — I130 Hypertensive heart and chronic kidney disease with heart failure and stage 1 through stage 4 chronic kidney disease, or unspecified chronic kidney disease: Secondary | ICD-10-CM | POA: Diagnosis not present

## 2018-08-09 DIAGNOSIS — N183 Chronic kidney disease, stage 3 (moderate): Secondary | ICD-10-CM | POA: Diagnosis not present

## 2018-08-09 DIAGNOSIS — L89612 Pressure ulcer of right heel, stage 2: Secondary | ICD-10-CM | POA: Diagnosis not present

## 2018-08-12 DIAGNOSIS — L89612 Pressure ulcer of right heel, stage 2: Secondary | ICD-10-CM | POA: Diagnosis not present

## 2018-08-12 DIAGNOSIS — I872 Venous insufficiency (chronic) (peripheral): Secondary | ICD-10-CM | POA: Diagnosis not present

## 2018-08-12 DIAGNOSIS — I480 Paroxysmal atrial fibrillation: Secondary | ICD-10-CM | POA: Diagnosis not present

## 2018-08-12 DIAGNOSIS — I5030 Unspecified diastolic (congestive) heart failure: Secondary | ICD-10-CM | POA: Diagnosis not present

## 2018-08-12 DIAGNOSIS — I130 Hypertensive heart and chronic kidney disease with heart failure and stage 1 through stage 4 chronic kidney disease, or unspecified chronic kidney disease: Secondary | ICD-10-CM | POA: Diagnosis not present

## 2018-08-12 DIAGNOSIS — N183 Chronic kidney disease, stage 3 (moderate): Secondary | ICD-10-CM | POA: Diagnosis not present

## 2018-08-15 ENCOUNTER — Other Ambulatory Visit: Payer: Self-pay | Admitting: Nurse Practitioner

## 2018-08-16 DIAGNOSIS — L89612 Pressure ulcer of right heel, stage 2: Secondary | ICD-10-CM | POA: Diagnosis not present

## 2018-08-16 DIAGNOSIS — N183 Chronic kidney disease, stage 3 (moderate): Secondary | ICD-10-CM | POA: Diagnosis not present

## 2018-08-16 DIAGNOSIS — I872 Venous insufficiency (chronic) (peripheral): Secondary | ICD-10-CM | POA: Diagnosis not present

## 2018-08-16 DIAGNOSIS — I5030 Unspecified diastolic (congestive) heart failure: Secondary | ICD-10-CM | POA: Diagnosis not present

## 2018-08-16 DIAGNOSIS — I130 Hypertensive heart and chronic kidney disease with heart failure and stage 1 through stage 4 chronic kidney disease, or unspecified chronic kidney disease: Secondary | ICD-10-CM | POA: Diagnosis not present

## 2018-08-16 DIAGNOSIS — I480 Paroxysmal atrial fibrillation: Secondary | ICD-10-CM | POA: Diagnosis not present

## 2018-08-17 ENCOUNTER — Ambulatory Visit (INDEPENDENT_AMBULATORY_CARE_PROVIDER_SITE_OTHER): Payer: Medicare Other | Admitting: Nurse Practitioner

## 2018-08-17 ENCOUNTER — Encounter: Payer: Self-pay | Admitting: Nurse Practitioner

## 2018-08-17 VITALS — BP 120/60 | HR 72 | Ht 59.0 in | Wt 102.0 lb

## 2018-08-17 DIAGNOSIS — Z7901 Long term (current) use of anticoagulants: Secondary | ICD-10-CM | POA: Diagnosis not present

## 2018-08-17 DIAGNOSIS — Z79899 Other long term (current) drug therapy: Secondary | ICD-10-CM | POA: Diagnosis not present

## 2018-08-17 DIAGNOSIS — I480 Paroxysmal atrial fibrillation: Secondary | ICD-10-CM | POA: Diagnosis not present

## 2018-08-17 DIAGNOSIS — I5042 Chronic combined systolic (congestive) and diastolic (congestive) heart failure: Secondary | ICD-10-CM

## 2018-08-17 MED ORDER — RIVAROXABAN 15 MG PO TABS: ORAL_TABLET | ORAL | 6 refills | Status: DC

## 2018-08-17 MED ORDER — METOPROLOL SUCCINATE ER 25 MG PO TB24: 12.5000 mg | ORAL_TABLET | Freq: Every day | ORAL | 3 refills | Status: AC

## 2018-08-17 MED ORDER — AMIODARONE HCL 200 MG PO TABS: 200.0000 mg | ORAL_TABLET | Freq: Every day | ORAL | 3 refills | Status: AC

## 2018-08-17 MED ORDER — FUROSEMIDE 40 MG PO TABS: 40.0000 mg | ORAL_TABLET | ORAL | 2 refills | Status: DC

## 2018-08-17 NOTE — Progress Notes (Signed)
CARDIOLOGY OFFICE NOTE  Date:  08/17/2018    [name] Date of Birth: May 16, 1928 Medical Record #[id number]  PCP:  Veronica Manes, MD  Cardiologist:  [name]    Chief Complaint  Patient presents with  . Atrial Fibrillation    Follow up visit - seen for Dr. Lovena Le    History of Present Illness: [name] is a 84 y.o. female who presents today for a follow up visit. Seen for Dr. Lovena Le. She has primarily followed with me. I used to take care of her husband many years ago when he was a patient of Dr. Susa Simmonds.   She has had a history of PAF - on chronic anticoagulation, chronic diastolic HF, HTN, HLD, OA, prior CVA, and RA.   Her anticoagulation was interrupted in 2018 due to profound anemia. She has had trouble tolerating CCB therapy in the past due to significant lower extremity edema. She has previously been on low dose amiodarone for her PAF to maintain NSR and prevent heart failure exacerbations.   Her care has been difficult to provide due to issues with transportation.    She was admitted back in early June - found to be in AF with RVR. She required diuresis and was going to have cardioversion but converted on her own. Seen by Dr. Acie Fredrickson at that time.   I then saw her in follow up - she was back home - her sons were moving her to [place] and she was not happy with this - she really wanted to stay in her home with her help that she had hired. Her medicines seemed unclear. She is severely limited by her orthopedic issues.   Comes in today. Here alone. She is in a wheelchair. She is now at [facility]. Has been there about 3 weeks. She is not happy with this. She feels very alone. She wishes she could come back home but that will not be happening. She is in the assisted living - basically is on her own. She still does her own medicines. Sounds like the plan is to try to get to [facility] - she is on the waiting list there. She is not driving. Not able to go  to the pharmacy. She is asking about getting an Xray - her back is hurting. She is wondering about her medicines - what she should be on, etc. She has a bruise on her forehead - she hit the freezer in her new kitchen.   Past Medical History:  Diagnosis Date  . Arthritis    severe  . Atrial fibrillation ([place])   . Chronic diastolic CHF (congestive heart failure) ([place])   . Chronic insomnia   . Degenerative arthritis   . Essential hypertension   . Hypercholesteremia   . Memory deficit 05/24/2013  . Osteoporosis   . PAF (paroxysmal atrial fibrillation) ([place])    a. Intolerant to amiodarone, discontinued October 2012.  Marland Kitchen RLS (restless legs syndrome) 08/11/2016  . Sinus bradycardia   . Stroke [facility]) 2007   mini stroke or TIA with aphasia     Past Surgical History:  Procedure Laterality Date  . CATARACT EXTRACTION  2009   ou  . FEMUR FRACTURE SURGERY  8/12  . left knee  2001  . REPLACEMENT TOTAL KNEE BILATERAL Right 1999  . right intertrochanteric hip fracture status post ORIF  August 2012  . right shoulder replacement  2009     Medications: Current Meds  Medication Sig  .  amiodarone (PACERONE) 200 MG tablet Take 1 tablet (200 mg total) by mouth daily.  . Cholecalciferol (VITAMIN D-3 PO) Take 1 capsule by mouth daily.  . Cyanocobalamin (VITAMIN B-12 PO) Take 1 tablet by mouth daily.  . diclofenac sodium (VOLTAREN) 1 % GEL Apply 4 g topically as needed (shoulder pain, severe pain).  . Eszopiclone 3 MG TABS Take 3 mg by mouth at bedtime. Take immediately before bedtime  . furosemide (LASIX) 40 MG tablet Take 1 tablet (40 mg total) by mouth every other day.  . hydrOXYzine (ATARAX/VISTARIL) 10 MG tablet Take 1 tablet (10 mg total) by mouth 3 (three) times daily as needed for anxiety.  . isosorbide mononitrate (IMDUR) 30 MG 24 hr tablet Take 1 tablet (30 mg total) by mouth daily.  Marland Kitchen lisinopril (PRINIVIL,ZESTRIL) 5 MG tablet take one by mouth daily  . LORazepam (ATIVAN) 0.5 MG tablet  Take 0.5 mg by mouth every 8 (eight) hours as needed for anxiety.   . Melatonin 3 MG TABS Take 3 tablets (9 mg total) by mouth at bedtime as needed (for sleep).  . metoprolol succinate (TOPROL-XL) 25 MG 24 hr tablet Take 0.5 tablets (12.5 mg total) by mouth daily.  . nitroGLYCERIN (NITROSTAT) 0.4 MG SL tablet DISSOLVE 1 TABLET UNDER THE TONGUE EVERY 5 MINUTES AS NEEDED FOR CHEST PAIN  . RESTASIS 0.05 % ophthalmic emulsion Place 1 drop into both eyes 2 (two) times daily.  . Rivaroxaban (XARELTO) 15 MG TABS tablet TAKE 1 TABLET(15 MG) BY MOUTH DAILY WITH SUPPER  . rivastigmine (EXELON) 4.5 MG capsule TAKE 1 CAPSULE(4.5 MG) BY MOUTH TWICE DAILY  . rOPINIRole (REQUIP) 0.25 MG tablet Take 0.25 mg by mouth at bedtime.   . traMADol (ULTRAM) 50 MG tablet Take 2 tablets (100 mg total) by mouth every 6 (six) hours as needed for severe pain.  . [DISCONTINUED] amiodarone (PACERONE) 200 MG tablet Take 1 tablet (200 mg total) by mouth daily.  . [DISCONTINUED] furosemide (LASIX) 40 MG tablet TAKE 1 TABLET(40 MG) BY MOUTH DAILY. MAY TAKE AN EXTRA 20 MG AS NEEDED FOR WEIGHT GAIN OF 2 LB OVERNIGHT  . [DISCONTINUED] metoprolol succinate (TOPROL-XL) 25 MG 24 hr tablet Take 0.5 tablets (12.5 mg total) by mouth daily.  . [DISCONTINUED] traMADol (ULTRAM) 50 MG tablet Take 100 mg by mouth every 6 (six) hours as needed.  . [DISCONTINUED] XARELTO 15 MG TABS tablet TAKE 1 TABLET(15 MG) BY MOUTH DAILY WITH SUPPER     Allergies: Allergies  Allergen Reactions  . Codeine Nausea Only    Social History: The patient  reports that she has never smoked. She has never used smokeless tobacco. She reports that she drinks about 7.0 standard drinks of alcohol per week. She reports that she does not use drugs.   Family History: The patient's family history includes Cancer in her father, sister, sister, and son; Cancer - Colon in her father; Coronary artery disease in her unknown relative; Dementia in her mother; Stroke in her  mother.   Review of Systems: Please see the history of present illness.   Otherwise, the review of systems is positive for none.   All other systems are reviewed and negative.   Physical Exam: VS:  BP 120/60 (BP Location: Left Arm, Patient Position: Sitting, Cuff Size: Normal)   Pulse 72   Ht 4\' 11"  (1.499 m)   Wt 102 lb (46.3 kg)   SpO2 97% Comment: at rest  BMI 20.60 kg/m  .  BMI Body mass index is  20.6 kg/m.  Wt Readings from Last 3 Encounters:  08/17/18 102 lb (46.3 kg)  05/23/18 106 lb 6.4 oz (48.3 kg)  05/03/18 110 lb (49.9 kg)    General: Pleasant. Elderly. She is beginning to look frail. Alert and in no acute distress. She is in a wheelchair.   HEENT: Normal. She has a bruise on her forehead.  Neck: Supple, no JVD, carotid bruits, or masses noted.  Cardiac: Regular rate and rhythm. No murmurs, rubs, or gallops. No edema.  Respiratory:  Lungs are clear to auscultation bilaterally with normal work of breathing.  GI: Soft and nontender.  MS: No deformity or atrophy. Gait not tested. She has lots of arthritic changes in her hands noted.  Skin: Warm and dry. Color is normal.  Neuro:  Strength and sensation are intact and no gross focal deficits noted.  Psych: Alert, appropriate and with normal affect.   LABORATORY DATA:  EKG:  EKG is ordered today. This demonstrates NSR - HR is 72  Lab Results  Component Value Date   WBC 6.7 05/23/2018   HGB 10.8 (L) 05/23/2018   HCT 33.1 (L) 05/23/2018   PLT 220 05/23/2018   GLUCOSE 87 05/23/2018   ALT 15 10/13/2017   AST 24 10/13/2017   NA 136 05/23/2018   K 3.8 05/23/2018   CL 90 (L) 05/23/2018   CREATININE 1.04 (H) 05/23/2018   BUN 27 05/23/2018   CO2 28 05/23/2018   TSH 0.837 04/06/2018   INR 1.26 05/26/2016       BNP (last 3 results) Recent Labs    04/06/18 0240  BNP 388.3*    ProBNP (last 3 results) No results for input(s): PROBNP in the last 8760 hours.   Other Studies Reviewed Today:  Echocardiogram  04/06/2018 Study Conclusions  - Left ventricle: The cavity size was normal. Wall thickness was increased in a pattern of mild LVH. Systolic function was mildly to moderately reduced. The estimated ejection fraction was in the range of 40% to 45%. Diffuse hypokinesis. The study is not technically sufficient to allow evaluation of LV diastolic function. - Aortic valve: Sclerosis without stenosis. There was trivial regurgitation. - Mitral valve: Mildly thickened leaflets . There was mild regurgitation. - Left atrium: Severely dilated. - Atrial septum: No defect or patent foramen ovale was identified. - Tricuspid valve: There was trivial regurgitation. - Pulmonary arteries: PA peak pressure: 28 mm Hg (S). - Inferior vena cava: The vessel was normal in size. The respirophasic diameter changes were in the normal range (>= 50%), consistent with normal central venous pressure.  Impressions:  - Compared to a prior study in 2016, the LVEF is lower at 40-45% with global hypokinesis, trivial AI, mild MR and severe LAE.     Assessment/Plan:  1. PAF - she had an exacerbation of RVR back in the summer - she was placed on higher doses of her amiodarone short term - now back to 200 mg a day. Remains in NSR.   2. History of bradycardia - HR is ok today - I have left her on her current regimen.   3. Chronic combined systolic and diastolic HF - EF now lower at 40 to 45% ?if this was related to her AF with RVR and was more of a tachycardia mediated event - she has been placed on less diuretic per PCP. We may entertain a repeat echo at some point but at this time she is stable from our standpoint.   4. Chest pain - her  last discharge summary noted "possible ischemic work up if EF failed to improve but could consider medical management given her frail state" - she and I have both opted for conservative management. She has no active chest pain.   5. RA - quite  debilitating for her.   6. Leg wound - not discussed today.   63. Advanced age - now at assisted living. She is not happy. Hopefully she will be adjust.   Current medicines are reviewed with the patient today.  The patient does not have concerns regarding medicines other than what has been noted above.  The following changes have been made:  See above.  Labs/ tests ordered today include:    Orders Placed This Encounter  Procedures  . EKG 12-Lead     Disposition:   FU with me in about 4 months.   Patient is agreeable to this plan and will call if any problems develop in the interim.   SignedTruitt Merle, NP  08/17/2018 2:44 PM  [facility] [address] [place], [place]  [address] Phone: [phone] Fax: [phone]

## 2018-08-17 NOTE — Patient Instructions (Addendum)
We will be checking the following labs today -   If you have labs (blood work) drawn today and your tests are completely normal, you will receive your results only by: Marland Kitchen MyChart Message (if you have MyChart) OR . A paper copy in the mail If you have any lab test that is abnormal or we need to change your treatment, we will call you to review the results.   Medication Instructions:    Continue with your current medicines.    If you need a refill on your cardiac medications before your next appointment, please call your pharmacy.     Testing/Procedures To Be Arranged:  N/A  Follow-Up:   See me in about 4 months    At [facility], you and your health needs are our priority.  As part of our continuing mission to provide you with exceptional heart care, we have created designated Provider Care Teams.  These Care Teams include your primary Cardiologist (physician) and Advanced Practice Providers (APPs -  Physician Assistants and Nurse Practitioners) who all work together to provide you with the care you need, when you need it.  Special Instructions:  . None  Call the [facility] office at [phone] if you have any questions, problems or concerns.

## 2018-08-19 DIAGNOSIS — L89612 Pressure ulcer of right heel, stage 2: Secondary | ICD-10-CM | POA: Diagnosis not present

## 2018-08-19 DIAGNOSIS — I130 Hypertensive heart and chronic kidney disease with heart failure and stage 1 through stage 4 chronic kidney disease, or unspecified chronic kidney disease: Secondary | ICD-10-CM | POA: Diagnosis not present

## 2018-08-19 DIAGNOSIS — I480 Paroxysmal atrial fibrillation: Secondary | ICD-10-CM | POA: Diagnosis not present

## 2018-08-19 DIAGNOSIS — I5030 Unspecified diastolic (congestive) heart failure: Secondary | ICD-10-CM | POA: Diagnosis not present

## 2018-08-19 DIAGNOSIS — I872 Venous insufficiency (chronic) (peripheral): Secondary | ICD-10-CM | POA: Diagnosis not present

## 2018-08-19 DIAGNOSIS — N183 Chronic kidney disease, stage 3 (moderate): Secondary | ICD-10-CM | POA: Diagnosis not present

## 2018-08-20 ENCOUNTER — Other Ambulatory Visit: Payer: Self-pay | Admitting: Neurology

## 2018-08-23 DIAGNOSIS — I480 Paroxysmal atrial fibrillation: Secondary | ICD-10-CM | POA: Diagnosis not present

## 2018-08-23 DIAGNOSIS — L89612 Pressure ulcer of right heel, stage 2: Secondary | ICD-10-CM | POA: Diagnosis not present

## 2018-08-23 DIAGNOSIS — I130 Hypertensive heart and chronic kidney disease with heart failure and stage 1 through stage 4 chronic kidney disease, or unspecified chronic kidney disease: Secondary | ICD-10-CM | POA: Diagnosis not present

## 2018-08-23 DIAGNOSIS — I5030 Unspecified diastolic (congestive) heart failure: Secondary | ICD-10-CM | POA: Diagnosis not present

## 2018-08-23 DIAGNOSIS — N183 Chronic kidney disease, stage 3 (moderate): Secondary | ICD-10-CM | POA: Diagnosis not present

## 2018-08-23 DIAGNOSIS — I872 Venous insufficiency (chronic) (peripheral): Secondary | ICD-10-CM | POA: Diagnosis not present

## 2018-08-25 ENCOUNTER — Other Ambulatory Visit: Payer: Self-pay | Admitting: Neurology

## 2018-08-26 DIAGNOSIS — I872 Venous insufficiency (chronic) (peripheral): Secondary | ICD-10-CM | POA: Diagnosis not present

## 2018-08-26 DIAGNOSIS — L89612 Pressure ulcer of right heel, stage 2: Secondary | ICD-10-CM | POA: Diagnosis not present

## 2018-08-26 DIAGNOSIS — N183 Chronic kidney disease, stage 3 (moderate): Secondary | ICD-10-CM | POA: Diagnosis not present

## 2018-08-26 DIAGNOSIS — I130 Hypertensive heart and chronic kidney disease with heart failure and stage 1 through stage 4 chronic kidney disease, or unspecified chronic kidney disease: Secondary | ICD-10-CM | POA: Diagnosis not present

## 2018-08-26 DIAGNOSIS — I480 Paroxysmal atrial fibrillation: Secondary | ICD-10-CM | POA: Diagnosis not present

## 2018-08-26 DIAGNOSIS — I5030 Unspecified diastolic (congestive) heart failure: Secondary | ICD-10-CM | POA: Diagnosis not present

## 2018-08-30 DIAGNOSIS — I5030 Unspecified diastolic (congestive) heart failure: Secondary | ICD-10-CM | POA: Diagnosis not present

## 2018-08-30 DIAGNOSIS — I872 Venous insufficiency (chronic) (peripheral): Secondary | ICD-10-CM | POA: Diagnosis not present

## 2018-08-30 DIAGNOSIS — N183 Chronic kidney disease, stage 3 (moderate): Secondary | ICD-10-CM | POA: Diagnosis not present

## 2018-08-30 DIAGNOSIS — L89612 Pressure ulcer of right heel, stage 2: Secondary | ICD-10-CM | POA: Diagnosis not present

## 2018-08-30 DIAGNOSIS — I480 Paroxysmal atrial fibrillation: Secondary | ICD-10-CM | POA: Diagnosis not present

## 2018-08-30 DIAGNOSIS — I130 Hypertensive heart and chronic kidney disease with heart failure and stage 1 through stage 4 chronic kidney disease, or unspecified chronic kidney disease: Secondary | ICD-10-CM | POA: Diagnosis not present

## 2018-09-02 DIAGNOSIS — I5030 Unspecified diastolic (congestive) heart failure: Secondary | ICD-10-CM | POA: Diagnosis not present

## 2018-09-02 DIAGNOSIS — I480 Paroxysmal atrial fibrillation: Secondary | ICD-10-CM | POA: Diagnosis not present

## 2018-09-02 DIAGNOSIS — N183 Chronic kidney disease, stage 3 (moderate): Secondary | ICD-10-CM | POA: Diagnosis not present

## 2018-09-02 DIAGNOSIS — I872 Venous insufficiency (chronic) (peripheral): Secondary | ICD-10-CM | POA: Diagnosis not present

## 2018-09-02 DIAGNOSIS — L89612 Pressure ulcer of right heel, stage 2: Secondary | ICD-10-CM | POA: Diagnosis not present

## 2018-09-02 DIAGNOSIS — I130 Hypertensive heart and chronic kidney disease with heart failure and stage 1 through stage 4 chronic kidney disease, or unspecified chronic kidney disease: Secondary | ICD-10-CM | POA: Diagnosis not present

## 2018-09-06 DIAGNOSIS — L89612 Pressure ulcer of right heel, stage 2: Secondary | ICD-10-CM | POA: Diagnosis not present

## 2018-09-06 DIAGNOSIS — I872 Venous insufficiency (chronic) (peripheral): Secondary | ICD-10-CM | POA: Diagnosis not present

## 2018-09-06 DIAGNOSIS — I130 Hypertensive heart and chronic kidney disease with heart failure and stage 1 through stage 4 chronic kidney disease, or unspecified chronic kidney disease: Secondary | ICD-10-CM | POA: Diagnosis not present

## 2018-09-06 DIAGNOSIS — N183 Chronic kidney disease, stage 3 (moderate): Secondary | ICD-10-CM | POA: Diagnosis not present

## 2018-09-06 DIAGNOSIS — I5030 Unspecified diastolic (congestive) heart failure: Secondary | ICD-10-CM | POA: Diagnosis not present

## 2018-09-06 DIAGNOSIS — Z0000 Encounter for general adult medical examination without abnormal findings: Secondary | ICD-10-CM | POA: Diagnosis not present

## 2018-09-06 DIAGNOSIS — I480 Paroxysmal atrial fibrillation: Secondary | ICD-10-CM | POA: Diagnosis not present

## 2018-09-09 DIAGNOSIS — I480 Paroxysmal atrial fibrillation: Secondary | ICD-10-CM | POA: Diagnosis not present

## 2018-09-09 DIAGNOSIS — L89612 Pressure ulcer of right heel, stage 2: Secondary | ICD-10-CM | POA: Diagnosis not present

## 2018-09-09 DIAGNOSIS — I872 Venous insufficiency (chronic) (peripheral): Secondary | ICD-10-CM | POA: Diagnosis not present

## 2018-09-09 DIAGNOSIS — I130 Hypertensive heart and chronic kidney disease with heart failure and stage 1 through stage 4 chronic kidney disease, or unspecified chronic kidney disease: Secondary | ICD-10-CM | POA: Diagnosis not present

## 2018-09-09 DIAGNOSIS — I5030 Unspecified diastolic (congestive) heart failure: Secondary | ICD-10-CM | POA: Diagnosis not present

## 2018-09-09 DIAGNOSIS — N183 Chronic kidney disease, stage 3 (moderate): Secondary | ICD-10-CM | POA: Diagnosis not present

## 2018-09-13 DIAGNOSIS — I480 Paroxysmal atrial fibrillation: Secondary | ICD-10-CM | POA: Diagnosis not present

## 2018-09-13 DIAGNOSIS — I130 Hypertensive heart and chronic kidney disease with heart failure and stage 1 through stage 4 chronic kidney disease, or unspecified chronic kidney disease: Secondary | ICD-10-CM | POA: Diagnosis not present

## 2018-09-13 DIAGNOSIS — L89612 Pressure ulcer of right heel, stage 2: Secondary | ICD-10-CM | POA: Diagnosis not present

## 2018-09-13 DIAGNOSIS — I872 Venous insufficiency (chronic) (peripheral): Secondary | ICD-10-CM | POA: Diagnosis not present

## 2018-09-13 DIAGNOSIS — I5030 Unspecified diastolic (congestive) heart failure: Secondary | ICD-10-CM | POA: Diagnosis not present

## 2018-09-13 DIAGNOSIS — N183 Chronic kidney disease, stage 3 (moderate): Secondary | ICD-10-CM | POA: Diagnosis not present

## 2018-09-16 DIAGNOSIS — N183 Chronic kidney disease, stage 3 (moderate): Secondary | ICD-10-CM | POA: Diagnosis not present

## 2018-09-16 DIAGNOSIS — I130 Hypertensive heart and chronic kidney disease with heart failure and stage 1 through stage 4 chronic kidney disease, or unspecified chronic kidney disease: Secondary | ICD-10-CM | POA: Diagnosis not present

## 2018-09-16 DIAGNOSIS — I5030 Unspecified diastolic (congestive) heart failure: Secondary | ICD-10-CM | POA: Diagnosis not present

## 2018-09-16 DIAGNOSIS — I480 Paroxysmal atrial fibrillation: Secondary | ICD-10-CM | POA: Diagnosis not present

## 2018-09-16 DIAGNOSIS — I872 Venous insufficiency (chronic) (peripheral): Secondary | ICD-10-CM | POA: Diagnosis not present

## 2018-09-16 DIAGNOSIS — L89612 Pressure ulcer of right heel, stage 2: Secondary | ICD-10-CM | POA: Diagnosis not present

## 2018-09-20 DIAGNOSIS — N183 Chronic kidney disease, stage 3 (moderate): Secondary | ICD-10-CM | POA: Diagnosis not present

## 2018-09-20 DIAGNOSIS — I130 Hypertensive heart and chronic kidney disease with heart failure and stage 1 through stage 4 chronic kidney disease, or unspecified chronic kidney disease: Secondary | ICD-10-CM | POA: Diagnosis not present

## 2018-09-20 DIAGNOSIS — I5030 Unspecified diastolic (congestive) heart failure: Secondary | ICD-10-CM | POA: Diagnosis not present

## 2018-09-20 DIAGNOSIS — L89612 Pressure ulcer of right heel, stage 2: Secondary | ICD-10-CM | POA: Diagnosis not present

## 2018-09-20 DIAGNOSIS — I480 Paroxysmal atrial fibrillation: Secondary | ICD-10-CM | POA: Diagnosis not present

## 2018-09-20 DIAGNOSIS — I872 Venous insufficiency (chronic) (peripheral): Secondary | ICD-10-CM | POA: Diagnosis not present

## 2018-09-23 DIAGNOSIS — I5030 Unspecified diastolic (congestive) heart failure: Secondary | ICD-10-CM | POA: Diagnosis not present

## 2018-09-23 DIAGNOSIS — I480 Paroxysmal atrial fibrillation: Secondary | ICD-10-CM | POA: Diagnosis not present

## 2018-09-23 DIAGNOSIS — I872 Venous insufficiency (chronic) (peripheral): Secondary | ICD-10-CM | POA: Diagnosis not present

## 2018-09-23 DIAGNOSIS — N183 Chronic kidney disease, stage 3 (moderate): Secondary | ICD-10-CM | POA: Diagnosis not present

## 2018-09-23 DIAGNOSIS — I130 Hypertensive heart and chronic kidney disease with heart failure and stage 1 through stage 4 chronic kidney disease, or unspecified chronic kidney disease: Secondary | ICD-10-CM | POA: Diagnosis not present

## 2018-09-23 DIAGNOSIS — L89612 Pressure ulcer of right heel, stage 2: Secondary | ICD-10-CM | POA: Diagnosis not present

## 2018-09-27 DIAGNOSIS — N183 Chronic kidney disease, stage 3 (moderate): Secondary | ICD-10-CM | POA: Diagnosis not present

## 2018-09-27 DIAGNOSIS — I872 Venous insufficiency (chronic) (peripheral): Secondary | ICD-10-CM | POA: Diagnosis not present

## 2018-09-27 DIAGNOSIS — I480 Paroxysmal atrial fibrillation: Secondary | ICD-10-CM | POA: Diagnosis not present

## 2018-09-27 DIAGNOSIS — I5030 Unspecified diastolic (congestive) heart failure: Secondary | ICD-10-CM | POA: Diagnosis not present

## 2018-09-27 DIAGNOSIS — I130 Hypertensive heart and chronic kidney disease with heart failure and stage 1 through stage 4 chronic kidney disease, or unspecified chronic kidney disease: Secondary | ICD-10-CM | POA: Diagnosis not present

## 2018-09-27 DIAGNOSIS — L89612 Pressure ulcer of right heel, stage 2: Secondary | ICD-10-CM | POA: Diagnosis not present

## 2018-10-01 DIAGNOSIS — I480 Paroxysmal atrial fibrillation: Secondary | ICD-10-CM | POA: Diagnosis not present

## 2018-10-01 DIAGNOSIS — I130 Hypertensive heart and chronic kidney disease with heart failure and stage 1 through stage 4 chronic kidney disease, or unspecified chronic kidney disease: Secondary | ICD-10-CM | POA: Diagnosis not present

## 2018-10-01 DIAGNOSIS — N183 Chronic kidney disease, stage 3 (moderate): Secondary | ICD-10-CM | POA: Diagnosis not present

## 2018-10-01 DIAGNOSIS — I872 Venous insufficiency (chronic) (peripheral): Secondary | ICD-10-CM | POA: Diagnosis not present

## 2018-10-01 DIAGNOSIS — L89612 Pressure ulcer of right heel, stage 2: Secondary | ICD-10-CM | POA: Diagnosis not present

## 2018-10-01 DIAGNOSIS — I5030 Unspecified diastolic (congestive) heart failure: Secondary | ICD-10-CM | POA: Diagnosis not present

## 2018-10-04 DIAGNOSIS — I5030 Unspecified diastolic (congestive) heart failure: Secondary | ICD-10-CM | POA: Diagnosis not present

## 2018-10-04 DIAGNOSIS — N183 Chronic kidney disease, stage 3 (moderate): Secondary | ICD-10-CM | POA: Diagnosis not present

## 2018-10-04 DIAGNOSIS — I872 Venous insufficiency (chronic) (peripheral): Secondary | ICD-10-CM | POA: Diagnosis not present

## 2018-10-04 DIAGNOSIS — L89612 Pressure ulcer of right heel, stage 2: Secondary | ICD-10-CM | POA: Diagnosis not present

## 2018-10-04 DIAGNOSIS — I130 Hypertensive heart and chronic kidney disease with heart failure and stage 1 through stage 4 chronic kidney disease, or unspecified chronic kidney disease: Secondary | ICD-10-CM | POA: Diagnosis not present

## 2018-10-04 DIAGNOSIS — I480 Paroxysmal atrial fibrillation: Secondary | ICD-10-CM | POA: Diagnosis not present

## 2018-10-06 DIAGNOSIS — Z8673 Personal history of transient ischemic attack (TIA), and cerebral infarction without residual deficits: Secondary | ICD-10-CM | POA: Diagnosis not present

## 2018-10-06 DIAGNOSIS — M48061 Spinal stenosis, lumbar region without neurogenic claudication: Secondary | ICD-10-CM | POA: Diagnosis not present

## 2018-10-06 DIAGNOSIS — I5030 Unspecified diastolic (congestive) heart failure: Secondary | ICD-10-CM | POA: Diagnosis not present

## 2018-10-06 DIAGNOSIS — B0229 Other postherpetic nervous system involvement: Secondary | ICD-10-CM | POA: Diagnosis not present

## 2018-10-06 DIAGNOSIS — I130 Hypertensive heart and chronic kidney disease with heart failure and stage 1 through stage 4 chronic kidney disease, or unspecified chronic kidney disease: Secondary | ICD-10-CM | POA: Diagnosis not present

## 2018-10-06 DIAGNOSIS — L89612 Pressure ulcer of right heel, stage 2: Secondary | ICD-10-CM | POA: Diagnosis not present

## 2018-10-06 DIAGNOSIS — E7800 Pure hypercholesterolemia, unspecified: Secondary | ICD-10-CM | POA: Diagnosis not present

## 2018-10-06 DIAGNOSIS — H9193 Unspecified hearing loss, bilateral: Secondary | ICD-10-CM | POA: Diagnosis not present

## 2018-10-06 DIAGNOSIS — F419 Anxiety disorder, unspecified: Secondary | ICD-10-CM | POA: Diagnosis not present

## 2018-10-06 DIAGNOSIS — Z9181 History of falling: Secondary | ICD-10-CM | POA: Diagnosis not present

## 2018-10-06 DIAGNOSIS — D509 Iron deficiency anemia, unspecified: Secondary | ICD-10-CM | POA: Diagnosis not present

## 2018-10-06 DIAGNOSIS — M810 Age-related osteoporosis without current pathological fracture: Secondary | ICD-10-CM | POA: Diagnosis not present

## 2018-10-06 DIAGNOSIS — Z7901 Long term (current) use of anticoagulants: Secondary | ICD-10-CM | POA: Diagnosis not present

## 2018-10-06 DIAGNOSIS — N3281 Overactive bladder: Secondary | ICD-10-CM | POA: Diagnosis not present

## 2018-10-06 DIAGNOSIS — M47816 Spondylosis without myelopathy or radiculopathy, lumbar region: Secondary | ICD-10-CM | POA: Diagnosis not present

## 2018-10-06 DIAGNOSIS — I872 Venous insufficiency (chronic) (peripheral): Secondary | ICD-10-CM | POA: Diagnosis not present

## 2018-10-06 DIAGNOSIS — M1991 Primary osteoarthritis, unspecified site: Secondary | ICD-10-CM | POA: Diagnosis not present

## 2018-10-06 DIAGNOSIS — I480 Paroxysmal atrial fibrillation: Secondary | ICD-10-CM | POA: Diagnosis not present

## 2018-10-06 DIAGNOSIS — N183 Chronic kidney disease, stage 3 (moderate): Secondary | ICD-10-CM | POA: Diagnosis not present

## 2018-10-07 DIAGNOSIS — I480 Paroxysmal atrial fibrillation: Secondary | ICD-10-CM | POA: Diagnosis not present

## 2018-10-07 DIAGNOSIS — I872 Venous insufficiency (chronic) (peripheral): Secondary | ICD-10-CM | POA: Diagnosis not present

## 2018-10-07 DIAGNOSIS — I5030 Unspecified diastolic (congestive) heart failure: Secondary | ICD-10-CM | POA: Diagnosis not present

## 2018-10-07 DIAGNOSIS — L89612 Pressure ulcer of right heel, stage 2: Secondary | ICD-10-CM | POA: Diagnosis not present

## 2018-10-07 DIAGNOSIS — N183 Chronic kidney disease, stage 3 (moderate): Secondary | ICD-10-CM | POA: Diagnosis not present

## 2018-10-07 DIAGNOSIS — I130 Hypertensive heart and chronic kidney disease with heart failure and stage 1 through stage 4 chronic kidney disease, or unspecified chronic kidney disease: Secondary | ICD-10-CM | POA: Diagnosis not present

## 2018-10-11 DIAGNOSIS — I480 Paroxysmal atrial fibrillation: Secondary | ICD-10-CM | POA: Diagnosis not present

## 2018-10-11 DIAGNOSIS — N183 Chronic kidney disease, stage 3 (moderate): Secondary | ICD-10-CM | POA: Diagnosis not present

## 2018-10-11 DIAGNOSIS — I872 Venous insufficiency (chronic) (peripheral): Secondary | ICD-10-CM | POA: Diagnosis not present

## 2018-10-11 DIAGNOSIS — I5030 Unspecified diastolic (congestive) heart failure: Secondary | ICD-10-CM | POA: Diagnosis not present

## 2018-10-11 DIAGNOSIS — L89612 Pressure ulcer of right heel, stage 2: Secondary | ICD-10-CM | POA: Diagnosis not present

## 2018-10-11 DIAGNOSIS — I130 Hypertensive heart and chronic kidney disease with heart failure and stage 1 through stage 4 chronic kidney disease, or unspecified chronic kidney disease: Secondary | ICD-10-CM | POA: Diagnosis not present

## 2018-10-14 DIAGNOSIS — I130 Hypertensive heart and chronic kidney disease with heart failure and stage 1 through stage 4 chronic kidney disease, or unspecified chronic kidney disease: Secondary | ICD-10-CM | POA: Diagnosis not present

## 2018-10-14 DIAGNOSIS — I480 Paroxysmal atrial fibrillation: Secondary | ICD-10-CM | POA: Diagnosis not present

## 2018-10-14 DIAGNOSIS — I5030 Unspecified diastolic (congestive) heart failure: Secondary | ICD-10-CM | POA: Diagnosis not present

## 2018-10-14 DIAGNOSIS — N183 Chronic kidney disease, stage 3 (moderate): Secondary | ICD-10-CM | POA: Diagnosis not present

## 2018-10-14 DIAGNOSIS — L89612 Pressure ulcer of right heel, stage 2: Secondary | ICD-10-CM | POA: Diagnosis not present

## 2018-10-14 DIAGNOSIS — I872 Venous insufficiency (chronic) (peripheral): Secondary | ICD-10-CM | POA: Diagnosis not present

## 2018-10-17 ENCOUNTER — Other Ambulatory Visit: Payer: Self-pay | Admitting: Geriatric Medicine

## 2018-10-17 ENCOUNTER — Ambulatory Visit
Admission: RE | Admit: 2018-10-17 | Discharge: 2018-10-17 | Disposition: A | Discharge status: Home / Self Care | Payer: Medicare Other | Source: Ambulatory Visit | Attending: Geriatric Medicine | Admitting: Geriatric Medicine

## 2018-10-17 DIAGNOSIS — M47812 Spondylosis without myelopathy or radiculopathy, cervical region: Secondary | ICD-10-CM | POA: Diagnosis not present

## 2018-10-17 DIAGNOSIS — M546 Pain in thoracic spine: Secondary | ICD-10-CM

## 2018-10-17 DIAGNOSIS — M439 Deforming dorsopathy, unspecified: Secondary | ICD-10-CM | POA: Diagnosis not present

## 2018-10-17 DIAGNOSIS — I4891 Unspecified atrial fibrillation: Secondary | ICD-10-CM | POA: Diagnosis not present

## 2018-10-17 DIAGNOSIS — I129 Hypertensive chronic kidney disease with stage 1 through stage 4 chronic kidney disease, or unspecified chronic kidney disease: Secondary | ICD-10-CM | POA: Diagnosis not present

## 2018-10-17 DIAGNOSIS — N183 Chronic kidney disease, stage 3 (moderate): Secondary | ICD-10-CM | POA: Diagnosis not present

## 2018-10-17 DIAGNOSIS — M810 Age-related osteoporosis without current pathological fracture: Secondary | ICD-10-CM | POA: Diagnosis not present

## 2018-10-18 DIAGNOSIS — I5030 Unspecified diastolic (congestive) heart failure: Secondary | ICD-10-CM | POA: Diagnosis not present

## 2018-10-18 DIAGNOSIS — N183 Chronic kidney disease, stage 3 (moderate): Secondary | ICD-10-CM | POA: Diagnosis not present

## 2018-10-18 DIAGNOSIS — L89612 Pressure ulcer of right heel, stage 2: Secondary | ICD-10-CM | POA: Diagnosis not present

## 2018-10-18 DIAGNOSIS — I130 Hypertensive heart and chronic kidney disease with heart failure and stage 1 through stage 4 chronic kidney disease, or unspecified chronic kidney disease: Secondary | ICD-10-CM | POA: Diagnosis not present

## 2018-10-18 DIAGNOSIS — I480 Paroxysmal atrial fibrillation: Secondary | ICD-10-CM | POA: Diagnosis not present

## 2018-10-18 DIAGNOSIS — I872 Venous insufficiency (chronic) (peripheral): Secondary | ICD-10-CM | POA: Diagnosis not present

## 2018-10-21 DIAGNOSIS — N183 Chronic kidney disease, stage 3 (moderate): Secondary | ICD-10-CM | POA: Diagnosis not present

## 2018-10-21 DIAGNOSIS — I872 Venous insufficiency (chronic) (peripheral): Secondary | ICD-10-CM | POA: Diagnosis not present

## 2018-10-21 DIAGNOSIS — I480 Paroxysmal atrial fibrillation: Secondary | ICD-10-CM | POA: Diagnosis not present

## 2018-10-21 DIAGNOSIS — I130 Hypertensive heart and chronic kidney disease with heart failure and stage 1 through stage 4 chronic kidney disease, or unspecified chronic kidney disease: Secondary | ICD-10-CM | POA: Diagnosis not present

## 2018-10-21 DIAGNOSIS — L89612 Pressure ulcer of right heel, stage 2: Secondary | ICD-10-CM | POA: Diagnosis not present

## 2018-10-21 DIAGNOSIS — I5030 Unspecified diastolic (congestive) heart failure: Secondary | ICD-10-CM | POA: Diagnosis not present

## 2018-10-25 DIAGNOSIS — I872 Venous insufficiency (chronic) (peripheral): Secondary | ICD-10-CM | POA: Diagnosis not present

## 2018-10-25 DIAGNOSIS — L89612 Pressure ulcer of right heel, stage 2: Secondary | ICD-10-CM | POA: Diagnosis not present

## 2018-10-25 DIAGNOSIS — I5030 Unspecified diastolic (congestive) heart failure: Secondary | ICD-10-CM | POA: Diagnosis not present

## 2018-10-25 DIAGNOSIS — N183 Chronic kidney disease, stage 3 (moderate): Secondary | ICD-10-CM | POA: Diagnosis not present

## 2018-10-25 DIAGNOSIS — I130 Hypertensive heart and chronic kidney disease with heart failure and stage 1 through stage 4 chronic kidney disease, or unspecified chronic kidney disease: Secondary | ICD-10-CM | POA: Diagnosis not present

## 2018-10-25 DIAGNOSIS — I480 Paroxysmal atrial fibrillation: Secondary | ICD-10-CM | POA: Diagnosis not present

## 2018-10-28 DIAGNOSIS — I130 Hypertensive heart and chronic kidney disease with heart failure and stage 1 through stage 4 chronic kidney disease, or unspecified chronic kidney disease: Secondary | ICD-10-CM | POA: Diagnosis not present

## 2018-10-28 DIAGNOSIS — N183 Chronic kidney disease, stage 3 (moderate): Secondary | ICD-10-CM | POA: Diagnosis not present

## 2018-10-28 DIAGNOSIS — I872 Venous insufficiency (chronic) (peripheral): Secondary | ICD-10-CM | POA: Diagnosis not present

## 2018-10-28 DIAGNOSIS — L89612 Pressure ulcer of right heel, stage 2: Secondary | ICD-10-CM | POA: Diagnosis not present

## 2018-10-28 DIAGNOSIS — I480 Paroxysmal atrial fibrillation: Secondary | ICD-10-CM | POA: Diagnosis not present

## 2018-10-28 DIAGNOSIS — I5030 Unspecified diastolic (congestive) heart failure: Secondary | ICD-10-CM | POA: Diagnosis not present

## 2018-11-01 DIAGNOSIS — M810 Age-related osteoporosis without current pathological fracture: Secondary | ICD-10-CM | POA: Diagnosis not present

## 2018-11-01 DIAGNOSIS — I509 Heart failure, unspecified: Secondary | ICD-10-CM | POA: Diagnosis not present

## 2018-11-01 DIAGNOSIS — I5030 Unspecified diastolic (congestive) heart failure: Secondary | ICD-10-CM | POA: Diagnosis not present

## 2018-11-01 DIAGNOSIS — I872 Venous insufficiency (chronic) (peripheral): Secondary | ICD-10-CM | POA: Diagnosis not present

## 2018-11-01 DIAGNOSIS — L89612 Pressure ulcer of right heel, stage 2: Secondary | ICD-10-CM | POA: Diagnosis not present

## 2018-11-01 DIAGNOSIS — I130 Hypertensive heart and chronic kidney disease with heart failure and stage 1 through stage 4 chronic kidney disease, or unspecified chronic kidney disease: Secondary | ICD-10-CM | POA: Diagnosis not present

## 2018-11-01 DIAGNOSIS — I129 Hypertensive chronic kidney disease with stage 1 through stage 4 chronic kidney disease, or unspecified chronic kidney disease: Secondary | ICD-10-CM | POA: Diagnosis not present

## 2018-11-01 DIAGNOSIS — I4891 Unspecified atrial fibrillation: Secondary | ICD-10-CM | POA: Diagnosis not present

## 2018-11-01 DIAGNOSIS — I480 Paroxysmal atrial fibrillation: Secondary | ICD-10-CM | POA: Diagnosis not present

## 2018-11-01 DIAGNOSIS — N183 Chronic kidney disease, stage 3 (moderate): Secondary | ICD-10-CM | POA: Diagnosis not present

## 2018-11-02 DIAGNOSIS — L89612 Pressure ulcer of right heel, stage 2: Secondary | ICD-10-CM | POA: Diagnosis not present

## 2018-11-02 DIAGNOSIS — I480 Paroxysmal atrial fibrillation: Secondary | ICD-10-CM | POA: Diagnosis not present

## 2018-11-02 DIAGNOSIS — I5030 Unspecified diastolic (congestive) heart failure: Secondary | ICD-10-CM | POA: Diagnosis not present

## 2018-11-02 DIAGNOSIS — I872 Venous insufficiency (chronic) (peripheral): Secondary | ICD-10-CM | POA: Diagnosis not present

## 2018-11-02 DIAGNOSIS — N183 Chronic kidney disease, stage 3 (moderate): Secondary | ICD-10-CM | POA: Diagnosis not present

## 2018-11-02 DIAGNOSIS — I130 Hypertensive heart and chronic kidney disease with heart failure and stage 1 through stage 4 chronic kidney disease, or unspecified chronic kidney disease: Secondary | ICD-10-CM | POA: Diagnosis not present

## 2018-11-04 DIAGNOSIS — I5030 Unspecified diastolic (congestive) heart failure: Secondary | ICD-10-CM | POA: Diagnosis not present

## 2018-11-04 DIAGNOSIS — N183 Chronic kidney disease, stage 3 (moderate): Secondary | ICD-10-CM | POA: Diagnosis not present

## 2018-11-04 DIAGNOSIS — I130 Hypertensive heart and chronic kidney disease with heart failure and stage 1 through stage 4 chronic kidney disease, or unspecified chronic kidney disease: Secondary | ICD-10-CM | POA: Diagnosis not present

## 2018-11-04 DIAGNOSIS — L89612 Pressure ulcer of right heel, stage 2: Secondary | ICD-10-CM | POA: Diagnosis not present

## 2018-11-04 DIAGNOSIS — I480 Paroxysmal atrial fibrillation: Secondary | ICD-10-CM | POA: Diagnosis not present

## 2018-11-04 DIAGNOSIS — I872 Venous insufficiency (chronic) (peripheral): Secondary | ICD-10-CM | POA: Diagnosis not present

## 2018-11-07 ENCOUNTER — Other Ambulatory Visit: Payer: Self-pay

## 2018-11-07 NOTE — Patient Outreach (Signed)
[facility]) Care Management  11/07/2018  LETANYA FROH 05-03-1928 [id number]   Telephone Screen  Referral Date: 11/03/2018 Referral Source: MD office Referral Reason: " HF, patient needs nurse to help in home,also needs transportation resources,pain mgmt, multiple health issues" Insurance: Medicare   Outreach attempt # 1 to patient. No answer at present. RN CM left HIPAA compliant voicemail message along with contact info.    Plan: RN CM will make outreach attempt to patient within 3-4 business days. RN CM will send unsuccessful letter to patient.    Enzo Montgomery, RN,BSN,CCM [place] Management Telephonic Care Management Coordinator Direct Phone: [phone] Toll Free: [phone] Fax: [phone]

## 2018-11-08 DIAGNOSIS — N183 Chronic kidney disease, stage 3 (moderate): Secondary | ICD-10-CM | POA: Diagnosis not present

## 2018-11-08 DIAGNOSIS — I5030 Unspecified diastolic (congestive) heart failure: Secondary | ICD-10-CM | POA: Diagnosis not present

## 2018-11-08 DIAGNOSIS — I480 Paroxysmal atrial fibrillation: Secondary | ICD-10-CM | POA: Diagnosis not present

## 2018-11-08 DIAGNOSIS — I872 Venous insufficiency (chronic) (peripheral): Secondary | ICD-10-CM | POA: Diagnosis not present

## 2018-11-08 DIAGNOSIS — I130 Hypertensive heart and chronic kidney disease with heart failure and stage 1 through stage 4 chronic kidney disease, or unspecified chronic kidney disease: Secondary | ICD-10-CM | POA: Diagnosis not present

## 2018-11-08 DIAGNOSIS — L89612 Pressure ulcer of right heel, stage 2: Secondary | ICD-10-CM | POA: Diagnosis not present

## 2018-11-09 ENCOUNTER — Other Ambulatory Visit: Payer: Self-pay

## 2018-11-09 NOTE — Progress Notes (Signed)
Appointment canceled; no charge. Violeta Gelinas NP-C [phone]

## 2018-11-09 NOTE — Patient Outreach (Signed)
[facility] [facility]) Care Management  11/09/2018  [name] 02/09/1928 [id number]     Telephone Screen  Referral Date: 11/03/2018 Referral Source: MD office Referral Reason: " HF, patient needs nurse to help in home,also needs transportation resources,pain mgmt, multiple health issues" Insurance: Medicare    Outreach attempt #2 to patient. No answer at present. RN CM left HIPAA compliant voicemail message along with contact info.     Plan: RN CM will make outreach attempt to patient within 3-4 business days.   Enzo Montgomery, RN,BSN,CCM [place] Management Telephonic Care Management Coordinator Direct Phone: [phone] Toll Free: [phone] Fax: [phone]

## 2018-11-10 ENCOUNTER — Non-Acute Institutional Stay: Payer: Medicare Other | Admitting: Internal Medicine

## 2018-11-11 ENCOUNTER — Other Ambulatory Visit: Payer: Self-pay

## 2018-11-11 DIAGNOSIS — N183 Chronic kidney disease, stage 3 (moderate): Secondary | ICD-10-CM | POA: Diagnosis not present

## 2018-11-11 DIAGNOSIS — I5030 Unspecified diastolic (congestive) heart failure: Secondary | ICD-10-CM | POA: Diagnosis not present

## 2018-11-11 DIAGNOSIS — I480 Paroxysmal atrial fibrillation: Secondary | ICD-10-CM | POA: Diagnosis not present

## 2018-11-11 DIAGNOSIS — L89612 Pressure ulcer of right heel, stage 2: Secondary | ICD-10-CM | POA: Diagnosis not present

## 2018-11-11 DIAGNOSIS — I130 Hypertensive heart and chronic kidney disease with heart failure and stage 1 through stage 4 chronic kidney disease, or unspecified chronic kidney disease: Secondary | ICD-10-CM | POA: Diagnosis not present

## 2018-11-11 DIAGNOSIS — I872 Venous insufficiency (chronic) (peripheral): Secondary | ICD-10-CM | POA: Diagnosis not present

## 2018-11-11 NOTE — Patient Outreach (Signed)
[place] [facility]) Care Management  11/11/2018  [name] 15-Jul-1928 [id number]   Telephone Screen  Referral Date:11/03/2018 Referral Source:MD office Referral Reason:" HF, patient needs nurse to help in home,also needs transportation resources,pain mgmt, multiple health issues" Insurance:Medicare   Outreach attempt #3 to patient. A female answered and reported patient was just getting out of shower and unable to talk. RN requested that patient return call when she is available to talk.      Plan: RN CM will make final outreach attempt to reach patient within 3-4 business days.  Enzo Montgomery, RN,BSN,CCM [place] Management Telephonic Care Management Coordinator Direct Phone: [phone] Toll Free: [phone] Fax: [phone]

## 2018-11-11 NOTE — Patient Outreach (Signed)
[facility] [facility]) Care Management  11/11/2018  [name] [birth year] [id number]   Telephone Screen  Referral Date:11/03/2018 Referral Source:MD office Referral Reason:" HF, patient needs nurse to help in home,also needs transportation resources,pain mgmt, multiple health issues" Insurance:Medicare   Outreach attempt back to patient per her request. Spoke with patient. RN CM briefly reviewed and discussed referral source and reason. Patient voiced that she will not be needing our services. She states that she has a hospice nurse in the home currently and will be enrolling in their services. [facility] does not provide services to hospice patients and patient made aware and voiced understanding.      Plan: RN CM will close case at this time. RN CM will send MD case closure letter.    Enzo Montgomery, RN,BSN,CCM [place] Management Telephonic Care Management Coordinator Direct Phone: [phone] Toll Free: [phone] Fax: [phone]

## 2018-11-14 ENCOUNTER — Telehealth: Payer: Self-pay | Admitting: Licensed Clinical Social Worker

## 2018-11-14 NOTE — Telephone Encounter (Signed)
SW left a vm on her home phone requesting a call back to schedule a home visit.  Her cell phone is not set up to take messages.

## 2018-11-15 ENCOUNTER — Telehealth: Payer: Self-pay | Admitting: Licensed Clinical Social Worker

## 2018-11-15 DIAGNOSIS — I872 Venous insufficiency (chronic) (peripheral): Secondary | ICD-10-CM | POA: Diagnosis not present

## 2018-11-15 DIAGNOSIS — N183 Chronic kidney disease, stage 3 (moderate): Secondary | ICD-10-CM | POA: Diagnosis not present

## 2018-11-15 DIAGNOSIS — I130 Hypertensive heart and chronic kidney disease with heart failure and stage 1 through stage 4 chronic kidney disease, or unspecified chronic kidney disease: Secondary | ICD-10-CM | POA: Diagnosis not present

## 2018-11-15 DIAGNOSIS — I5030 Unspecified diastolic (congestive) heart failure: Secondary | ICD-10-CM | POA: Diagnosis not present

## 2018-11-15 DIAGNOSIS — L89612 Pressure ulcer of right heel, stage 2: Secondary | ICD-10-CM | POA: Diagnosis not present

## 2018-11-15 DIAGNOSIS — I480 Paroxysmal atrial fibrillation: Secondary | ICD-10-CM | POA: Diagnosis not present

## 2018-11-15 NOTE — Telephone Encounter (Signed)
SW returned patient's phone call and scheduled a home visit for 11/16/18, Wednesday at 2:15.

## 2018-11-16 ENCOUNTER — Other Ambulatory Visit: Payer: Medicare Other | Admitting: Licensed Clinical Social Worker

## 2018-11-16 DIAGNOSIS — Z515 Encounter for palliative care: Secondary | ICD-10-CM

## 2018-11-17 NOTE — Progress Notes (Signed)
COMMUNITY PALLIATIVE CARE SW NOTE  PATIENT NAME: [name] DOB: [birth year] MRN: [id number]  PRIMARY CARE PROVIDER: Lajean Manes, MD  RESPONSIBLE PARTY:  Acct ID - Guarantor Home Phone Work Phone Relationship Acct Type  [id number] [name], [name] [phone]  Self P/F     [address], [place], [place] [address]     PLAN OF CARE and INTERVENTIONS:             1. GOALS OF CARE/ ADVANCE CARE PLANNING:  Patient's goal is to remain in her apartment and to not be hospitalized again.  She has a DNR. 2. SOCIAL/EMOTIONAL/SPIRITUAL ASSESSMENT/ INTERVENTIONS:  SW met with patient in her apartment at [employer].  She uses a w/c to ambulate most of the time.  She complained of shortness of breath with any exertion.  SW provided active listening and supportive counseling when she discussed her various surgeries and pain issues.  Her pain medication regimen appears to be effective currently.  Her struggle with everyday tasks has increased.  SW also provided bereavement counseling regarding her deceased son.  Patient has a son who lives and [place], [place], and a son who lives in [place].  Patient has Master's degree in education.  Her husband, who dies twelve years ago, was a professor at [employer].  Patient's [employer] faith is very important to her. 3. PATIENT/CAREGIVER EDUCATION/ COPING:  SW provided education to the patient regarding Palliative Care and Hospice.  She expresses her feelings openly. 4. PERSONAL EMERGENCY PLAN:  Patient has a cell phone to call family.  She also has a life alert. 5. COMMUNITY RESOURCES COORDINATION/ HEALTH CARE NAVIGATION:  Patient utilizes [facility] to administer pain medication cream on her back and personal care.  [facility] also provides dressing care to a wound on her right let. 6. FINANCIAL/LEGAL CONCERNS/INTERVENTIONS:  Patient stated she could not afford private sitters 24/7.     SOCIAL HX:  Social History   Tobacco Use  .  Smoking status: Never Smoker  . Smokeless tobacco: Never Used  Substance Use Topics  . Alcohol use: Yes    Alcohol/week: 7.0 standard drinks    Types: 7 Glasses of wine per week    Comment: 1 glass wine with dinner some days    CODE STATUS:  DNR  ADVANCED DIRECTIVES: Y MOST FORM COMPLETE:  SW provided patient with a copy of a MOST form to review. HOSPICE EDUCATION PROVIDED: Yes PPS:  Patient reports that her appetite has decreased.  She utilizes an IT trainer w/c.  She can stand for short periods of time. Duration of visit and documentation:  100 minutes.      Creola Corn Claudell Wohler, LCSW

## 2018-11-18 DIAGNOSIS — N183 Chronic kidney disease, stage 3 (moderate): Secondary | ICD-10-CM | POA: Diagnosis not present

## 2018-11-18 DIAGNOSIS — I130 Hypertensive heart and chronic kidney disease with heart failure and stage 1 through stage 4 chronic kidney disease, or unspecified chronic kidney disease: Secondary | ICD-10-CM | POA: Diagnosis not present

## 2018-11-18 DIAGNOSIS — L89612 Pressure ulcer of right heel, stage 2: Secondary | ICD-10-CM | POA: Diagnosis not present

## 2018-11-18 DIAGNOSIS — I872 Venous insufficiency (chronic) (peripheral): Secondary | ICD-10-CM | POA: Diagnosis not present

## 2018-11-18 DIAGNOSIS — I5030 Unspecified diastolic (congestive) heart failure: Secondary | ICD-10-CM | POA: Diagnosis not present

## 2018-11-18 DIAGNOSIS — I480 Paroxysmal atrial fibrillation: Secondary | ICD-10-CM | POA: Diagnosis not present

## 2018-11-21 ENCOUNTER — Telehealth: Payer: Self-pay | Admitting: Licensed Clinical Social Worker

## 2018-11-21 ENCOUNTER — Ambulatory Visit: Payer: Medicare Other | Admitting: Neurology

## 2018-11-21 DIAGNOSIS — H353132 Nonexudative age-related macular degeneration, bilateral, intermediate dry stage: Secondary | ICD-10-CM | POA: Diagnosis not present

## 2018-11-21 DIAGNOSIS — H524 Presbyopia: Secondary | ICD-10-CM | POA: Diagnosis not present

## 2018-11-21 DIAGNOSIS — Z961 Presence of intraocular lens: Secondary | ICD-10-CM | POA: Diagnosis not present

## 2018-11-21 NOTE — Telephone Encounter (Signed)
SW returned call from patient and left a vm.

## 2018-11-22 ENCOUNTER — Telehealth: Payer: Self-pay

## 2018-11-22 ENCOUNTER — Telehealth: Payer: Self-pay | Admitting: Licensed Clinical Social Worker

## 2018-11-22 DIAGNOSIS — I480 Paroxysmal atrial fibrillation: Secondary | ICD-10-CM | POA: Diagnosis not present

## 2018-11-22 DIAGNOSIS — L89612 Pressure ulcer of right heel, stage 2: Secondary | ICD-10-CM | POA: Diagnosis not present

## 2018-11-22 DIAGNOSIS — N183 Chronic kidney disease, stage 3 (moderate): Secondary | ICD-10-CM | POA: Diagnosis not present

## 2018-11-22 DIAGNOSIS — I5030 Unspecified diastolic (congestive) heart failure: Secondary | ICD-10-CM | POA: Diagnosis not present

## 2018-11-22 DIAGNOSIS — I130 Hypertensive heart and chronic kidney disease with heart failure and stage 1 through stage 4 chronic kidney disease, or unspecified chronic kidney disease: Secondary | ICD-10-CM | POA: Diagnosis not present

## 2018-11-22 DIAGNOSIS — I872 Venous insufficiency (chronic) (peripheral): Secondary | ICD-10-CM | POA: Diagnosis not present

## 2018-11-22 NOTE — Telephone Encounter (Signed)
SW returned patient's call.  She sounded short of breath and asked to be under Hospice care.  SW informed Palliative Care Team.

## 2018-11-22 NOTE — Telephone Encounter (Signed)
Received phone call from patient who shared that she continues to have shortness of breath. She would like to have Jeani Hawking, Palliative social worker to call her. Jeani Hawking made aware

## 2018-11-22 NOTE — Telephone Encounter (Signed)
Received update from [employer] SW that patient would like to transition to hospice services. Per review of documentation, patient was deemed hospice elligible on 11/09/2018 but declined services at that time. Phone call placed to PCP office. Message left for [place] with return contact information

## 2018-11-25 ENCOUNTER — Telehealth: Payer: Self-pay | Admitting: Licensed Clinical Social Worker

## 2018-11-25 DIAGNOSIS — I5030 Unspecified diastolic (congestive) heart failure: Secondary | ICD-10-CM | POA: Diagnosis not present

## 2018-11-25 DIAGNOSIS — L89612 Pressure ulcer of right heel, stage 2: Secondary | ICD-10-CM | POA: Diagnosis not present

## 2018-11-25 DIAGNOSIS — I480 Paroxysmal atrial fibrillation: Secondary | ICD-10-CM | POA: Diagnosis not present

## 2018-11-25 DIAGNOSIS — N183 Chronic kidney disease, stage 3 (moderate): Secondary | ICD-10-CM | POA: Diagnosis not present

## 2018-11-25 DIAGNOSIS — I130 Hypertensive heart and chronic kidney disease with heart failure and stage 1 through stage 4 chronic kidney disease, or unspecified chronic kidney disease: Secondary | ICD-10-CM | POA: Diagnosis not present

## 2018-11-25 DIAGNOSIS — I872 Venous insufficiency (chronic) (peripheral): Secondary | ICD-10-CM | POA: Diagnosis not present

## 2018-11-25 NOTE — Telephone Encounter (Signed)
Palliative Care SW scheduled a home visit with patient for next Wednesday, 1/29, at 10am.

## 2018-11-28 DIAGNOSIS — N183 Chronic kidney disease, stage 3 (moderate): Secondary | ICD-10-CM | POA: Diagnosis not present

## 2018-11-28 DIAGNOSIS — I480 Paroxysmal atrial fibrillation: Secondary | ICD-10-CM | POA: Diagnosis not present

## 2018-11-28 DIAGNOSIS — I129 Hypertensive chronic kidney disease with stage 1 through stage 4 chronic kidney disease, or unspecified chronic kidney disease: Secondary | ICD-10-CM | POA: Diagnosis not present

## 2018-11-28 DIAGNOSIS — M810 Age-related osteoporosis without current pathological fracture: Secondary | ICD-10-CM | POA: Diagnosis not present

## 2018-11-28 DIAGNOSIS — I509 Heart failure, unspecified: Secondary | ICD-10-CM | POA: Diagnosis not present

## 2018-11-28 DIAGNOSIS — I4891 Unspecified atrial fibrillation: Secondary | ICD-10-CM | POA: Diagnosis not present

## 2018-11-29 DIAGNOSIS — I130 Hypertensive heart and chronic kidney disease with heart failure and stage 1 through stage 4 chronic kidney disease, or unspecified chronic kidney disease: Secondary | ICD-10-CM | POA: Diagnosis not present

## 2018-11-29 DIAGNOSIS — I480 Paroxysmal atrial fibrillation: Secondary | ICD-10-CM | POA: Diagnosis not present

## 2018-11-29 DIAGNOSIS — S32000S Wedge compression fracture of unspecified lumbar vertebra, sequela: Secondary | ICD-10-CM | POA: Diagnosis not present

## 2018-11-29 DIAGNOSIS — I4891 Unspecified atrial fibrillation: Secondary | ICD-10-CM | POA: Diagnosis not present

## 2018-11-29 DIAGNOSIS — G2581 Restless legs syndrome: Secondary | ICD-10-CM | POA: Diagnosis not present

## 2018-11-29 DIAGNOSIS — G459 Transient cerebral ischemic attack, unspecified: Secondary | ICD-10-CM | POA: Diagnosis not present

## 2018-11-29 DIAGNOSIS — I5030 Unspecified diastolic (congestive) heart failure: Secondary | ICD-10-CM | POA: Diagnosis not present

## 2018-11-29 DIAGNOSIS — M069 Rheumatoid arthritis, unspecified: Secondary | ICD-10-CM | POA: Diagnosis not present

## 2018-11-29 DIAGNOSIS — I5040 Unspecified combined systolic (congestive) and diastolic (congestive) heart failure: Secondary | ICD-10-CM | POA: Diagnosis not present

## 2018-11-29 DIAGNOSIS — I872 Venous insufficiency (chronic) (peripheral): Secondary | ICD-10-CM | POA: Diagnosis not present

## 2018-11-29 DIAGNOSIS — F0390 Unspecified dementia without behavioral disturbance: Secondary | ICD-10-CM | POA: Diagnosis not present

## 2018-11-29 DIAGNOSIS — M810 Age-related osteoporosis without current pathological fracture: Secondary | ICD-10-CM | POA: Diagnosis not present

## 2018-11-29 DIAGNOSIS — L89610 Pressure ulcer of right heel, unstageable: Secondary | ICD-10-CM | POA: Diagnosis not present

## 2018-11-29 DIAGNOSIS — L89612 Pressure ulcer of right heel, stage 2: Secondary | ICD-10-CM | POA: Diagnosis not present

## 2018-11-29 DIAGNOSIS — H3530 Unspecified macular degeneration: Secondary | ICD-10-CM | POA: Diagnosis not present

## 2018-11-29 DIAGNOSIS — G894 Chronic pain syndrome: Secondary | ICD-10-CM | POA: Diagnosis not present

## 2018-11-29 DIAGNOSIS — N183 Chronic kidney disease, stage 3 (moderate): Secondary | ICD-10-CM | POA: Diagnosis not present

## 2018-11-30 DIAGNOSIS — M069 Rheumatoid arthritis, unspecified: Secondary | ICD-10-CM | POA: Diagnosis not present

## 2018-11-30 DIAGNOSIS — S32000S Wedge compression fracture of unspecified lumbar vertebra, sequela: Secondary | ICD-10-CM | POA: Diagnosis not present

## 2018-11-30 DIAGNOSIS — I5040 Unspecified combined systolic (congestive) and diastolic (congestive) heart failure: Secondary | ICD-10-CM | POA: Diagnosis not present

## 2018-11-30 DIAGNOSIS — L89610 Pressure ulcer of right heel, unstageable: Secondary | ICD-10-CM | POA: Diagnosis not present

## 2018-11-30 DIAGNOSIS — F0390 Unspecified dementia without behavioral disturbance: Secondary | ICD-10-CM | POA: Diagnosis not present

## 2018-11-30 DIAGNOSIS — I4891 Unspecified atrial fibrillation: Secondary | ICD-10-CM | POA: Diagnosis not present

## 2018-12-01 ENCOUNTER — Encounter: Payer: Self-pay | Admitting: Nurse Practitioner

## 2018-12-01 DIAGNOSIS — L89610 Pressure ulcer of right heel, unstageable: Secondary | ICD-10-CM | POA: Diagnosis not present

## 2018-12-01 DIAGNOSIS — M069 Rheumatoid arthritis, unspecified: Secondary | ICD-10-CM | POA: Diagnosis not present

## 2018-12-01 DIAGNOSIS — I4891 Unspecified atrial fibrillation: Secondary | ICD-10-CM | POA: Diagnosis not present

## 2018-12-01 DIAGNOSIS — S32000S Wedge compression fracture of unspecified lumbar vertebra, sequela: Secondary | ICD-10-CM | POA: Diagnosis not present

## 2018-12-01 DIAGNOSIS — I5040 Unspecified combined systolic (congestive) and diastolic (congestive) heart failure: Secondary | ICD-10-CM | POA: Diagnosis not present

## 2018-12-01 DIAGNOSIS — F0390 Unspecified dementia without behavioral disturbance: Secondary | ICD-10-CM | POA: Diagnosis not present

## 2018-12-02 DIAGNOSIS — I4891 Unspecified atrial fibrillation: Secondary | ICD-10-CM | POA: Diagnosis not present

## 2018-12-02 DIAGNOSIS — F0390 Unspecified dementia without behavioral disturbance: Secondary | ICD-10-CM | POA: Diagnosis not present

## 2018-12-02 DIAGNOSIS — M069 Rheumatoid arthritis, unspecified: Secondary | ICD-10-CM | POA: Diagnosis not present

## 2018-12-02 DIAGNOSIS — I5040 Unspecified combined systolic (congestive) and diastolic (congestive) heart failure: Secondary | ICD-10-CM | POA: Diagnosis not present

## 2018-12-02 DIAGNOSIS — L89610 Pressure ulcer of right heel, unstageable: Secondary | ICD-10-CM | POA: Diagnosis not present

## 2018-12-02 DIAGNOSIS — S32000S Wedge compression fracture of unspecified lumbar vertebra, sequela: Secondary | ICD-10-CM | POA: Diagnosis not present

## 2018-12-03 ENCOUNTER — Telehealth: Payer: Self-pay | Admitting: Neurology

## 2018-12-03 DIAGNOSIS — I4891 Unspecified atrial fibrillation: Secondary | ICD-10-CM | POA: Diagnosis not present

## 2018-12-03 DIAGNOSIS — F0390 Unspecified dementia without behavioral disturbance: Secondary | ICD-10-CM | POA: Diagnosis not present

## 2018-12-03 DIAGNOSIS — M810 Age-related osteoporosis without current pathological fracture: Secondary | ICD-10-CM | POA: Diagnosis not present

## 2018-12-03 DIAGNOSIS — H3530 Unspecified macular degeneration: Secondary | ICD-10-CM | POA: Diagnosis not present

## 2018-12-03 DIAGNOSIS — L89610 Pressure ulcer of right heel, unstageable: Secondary | ICD-10-CM | POA: Diagnosis not present

## 2018-12-03 DIAGNOSIS — G459 Transient cerebral ischemic attack, unspecified: Secondary | ICD-10-CM | POA: Diagnosis not present

## 2018-12-03 DIAGNOSIS — S32000S Wedge compression fracture of unspecified lumbar vertebra, sequela: Secondary | ICD-10-CM | POA: Diagnosis not present

## 2018-12-03 DIAGNOSIS — G2581 Restless legs syndrome: Secondary | ICD-10-CM | POA: Diagnosis not present

## 2018-12-03 DIAGNOSIS — M069 Rheumatoid arthritis, unspecified: Secondary | ICD-10-CM | POA: Diagnosis not present

## 2018-12-03 DIAGNOSIS — G894 Chronic pain syndrome: Secondary | ICD-10-CM | POA: Diagnosis not present

## 2018-12-03 DIAGNOSIS — I5040 Unspecified combined systolic (congestive) and diastolic (congestive) heart failure: Secondary | ICD-10-CM | POA: Diagnosis not present

## 2018-12-03 NOTE — Telephone Encounter (Signed)
This patient has been admitted to hospice care.

## 2018-12-05 ENCOUNTER — Other Ambulatory Visit: Payer: Self-pay | Admitting: Neurology

## 2018-12-05 DIAGNOSIS — L89610 Pressure ulcer of right heel, unstageable: Secondary | ICD-10-CM | POA: Diagnosis not present

## 2018-12-05 DIAGNOSIS — F0390 Unspecified dementia without behavioral disturbance: Secondary | ICD-10-CM | POA: Diagnosis not present

## 2018-12-05 DIAGNOSIS — S32000S Wedge compression fracture of unspecified lumbar vertebra, sequela: Secondary | ICD-10-CM | POA: Diagnosis not present

## 2018-12-05 DIAGNOSIS — I4891 Unspecified atrial fibrillation: Secondary | ICD-10-CM | POA: Diagnosis not present

## 2018-12-05 DIAGNOSIS — I5040 Unspecified combined systolic (congestive) and diastolic (congestive) heart failure: Secondary | ICD-10-CM | POA: Diagnosis not present

## 2018-12-05 DIAGNOSIS — M069 Rheumatoid arthritis, unspecified: Secondary | ICD-10-CM | POA: Diagnosis not present

## 2018-12-05 NOTE — Telephone Encounter (Signed)
[name]/Brown Darrick Penna [phone] needs to know the reason rivastigmine (EXELON) 4.5 MG capsule was sent back stating refill not appropriate. She is aware the pt is under Hospice Care but not sure if she should contact Hospice Dr at this time. Please call to advise.

## 2018-12-05 NOTE — Telephone Encounter (Signed)
I contacted Haynes Dage @ [facility] November back and advised I have not seen a e-script for this pt today or a hard copy rx.Haynes Dage is going to fax a refill request to our office to have Dr. Jannifer Franklin review.Marland Kitchen

## 2018-12-05 NOTE — Telephone Encounter (Signed)
I would agree that the Exelon should be discontinued.

## 2018-12-05 NOTE — Telephone Encounter (Signed)
Refill request for Exelon 4.5 mg received from [facility]. Haynes Dage needed to confirm if this refill is appropriate due to pt being admitted to hospice?

## 2018-12-06 DIAGNOSIS — S32000S Wedge compression fracture of unspecified lumbar vertebra, sequela: Secondary | ICD-10-CM | POA: Diagnosis not present

## 2018-12-06 DIAGNOSIS — I5040 Unspecified combined systolic (congestive) and diastolic (congestive) heart failure: Secondary | ICD-10-CM | POA: Diagnosis not present

## 2018-12-06 DIAGNOSIS — I4891 Unspecified atrial fibrillation: Secondary | ICD-10-CM | POA: Diagnosis not present

## 2018-12-06 DIAGNOSIS — M069 Rheumatoid arthritis, unspecified: Secondary | ICD-10-CM | POA: Diagnosis not present

## 2018-12-06 DIAGNOSIS — L89610 Pressure ulcer of right heel, unstageable: Secondary | ICD-10-CM | POA: Diagnosis not present

## 2018-12-06 DIAGNOSIS — F0390 Unspecified dementia without behavioral disturbance: Secondary | ICD-10-CM | POA: Diagnosis not present

## 2018-12-06 NOTE — Telephone Encounter (Signed)
I contacted the pharmacy back and spoke with Shanon Brow advising we would be d/c Exelon. He verbalized understanding.

## 2018-12-09 DIAGNOSIS — I4891 Unspecified atrial fibrillation: Secondary | ICD-10-CM | POA: Diagnosis not present

## 2018-12-09 DIAGNOSIS — S32000S Wedge compression fracture of unspecified lumbar vertebra, sequela: Secondary | ICD-10-CM | POA: Diagnosis not present

## 2018-12-09 DIAGNOSIS — F0390 Unspecified dementia without behavioral disturbance: Secondary | ICD-10-CM | POA: Diagnosis not present

## 2018-12-09 DIAGNOSIS — I5040 Unspecified combined systolic (congestive) and diastolic (congestive) heart failure: Secondary | ICD-10-CM | POA: Diagnosis not present

## 2018-12-09 DIAGNOSIS — M069 Rheumatoid arthritis, unspecified: Secondary | ICD-10-CM | POA: Diagnosis not present

## 2018-12-09 DIAGNOSIS — L89610 Pressure ulcer of right heel, unstageable: Secondary | ICD-10-CM | POA: Diagnosis not present

## 2018-12-13 ENCOUNTER — Encounter

## 2018-12-13 ENCOUNTER — Ambulatory Visit: Payer: Medicare Other | Admitting: Neurology

## 2018-12-13 ENCOUNTER — Telehealth: Payer: Self-pay | Admitting: Neurology

## 2018-12-13 DIAGNOSIS — S32000S Wedge compression fracture of unspecified lumbar vertebra, sequela: Secondary | ICD-10-CM | POA: Diagnosis not present

## 2018-12-13 DIAGNOSIS — F0390 Unspecified dementia without behavioral disturbance: Secondary | ICD-10-CM | POA: Diagnosis not present

## 2018-12-13 DIAGNOSIS — M069 Rheumatoid arthritis, unspecified: Secondary | ICD-10-CM | POA: Diagnosis not present

## 2018-12-13 DIAGNOSIS — I5040 Unspecified combined systolic (congestive) and diastolic (congestive) heart failure: Secondary | ICD-10-CM | POA: Diagnosis not present

## 2018-12-13 DIAGNOSIS — I4891 Unspecified atrial fibrillation: Secondary | ICD-10-CM | POA: Diagnosis not present

## 2018-12-13 DIAGNOSIS — L89610 Pressure ulcer of right heel, unstageable: Secondary | ICD-10-CM | POA: Diagnosis not present

## 2018-12-13 MED ORDER — RIVASTIGMINE TARTRATE 4.5 MG PO CAPS: 4.5000 mg | ORAL_CAPSULE | Freq: Two times a day (BID) | ORAL | 1 refills | Status: DC

## 2018-12-13 NOTE — Telephone Encounter (Signed)
I called the patient.  The patient thought she had a revisit today, she is out of her Exelon, she feels that her memory is slipping, I will set her up for a revisit in the next week or 2.  We may need to go up on the Exelon dose.  I sent another prescription for Exelon, she has run out several days ago.

## 2018-12-13 NOTE — Telephone Encounter (Signed)
Pt has called stating she was going to possibly be late for appointment today.  Pt was told the appointment was cancelled.  Pt stated she very much needed to see Dr Jannifer Franklin about her rivastigmine (EXELON) 4.5 MG capsule .  Pt states she was told that she could not get a refill on her rivastigmine (EXELON) 4.5 MG capsule  until she has seen Dr Jannifer Franklin.  Pt states she has been without rivastigmine (EXELON) 4.5 MG capsule  for 3 days and she feels herself going down with out it.  Pt during called asked to be called to discuss her being without this medication.  Pt was placed on hold while phone rep confirmed with RN that pt could be scheduled with NP for needed f/u.  Phone rep went back to pt to inform of 1st available being 03-05 and pt was no longer on the line.

## 2018-12-13 NOTE — Telephone Encounter (Signed)
I contacted [place] with hospice of [facility]. She states she was calling to confirm dosage of Exelon capsules not Xarelto 15 mg.  I was able to advised Dr. Jannifer Franklin had agreed to refill Exelon 4.5 mg 2 capsules daily. Also advised pt has scheduled f/u with Dr. Jannifer Franklin for 01/16/19.  [name] verbalized understanding and stated she would make notation of this for hospice MD. She states hospice is trying to get the pt's current medication list up to date.  She also states pt has been offered O2 several times but pt has been declining.   I advised I would send message to update MD. Jerrell Belfast verbalized appreciation. No call back needed.

## 2018-12-13 NOTE — Telephone Encounter (Signed)
Please call Jerrell Belfast from [facility] at [phone] regarding her Rivaroxaban (XARELTO) 15 MG TABS tablet she needs to verify dosage on this medication.

## 2018-12-13 NOTE — Telephone Encounter (Signed)
I contacted the pt. Earliest work in appt I could offer to the pt was for 01/16/19 at 12 pm with a check in time of 11:30. Pt was agreeable to his appt but I advised I would place her on the wait list if a earlier appt became available. Pt preferred to be scheduled with Dr. Jannifer Franklin.

## 2018-12-13 NOTE — Addendum Note (Signed)
Addended by: Kathrynn Ducking on: 12/13/2018 12:57 PM   Modules accepted: Orders

## 2018-12-14 DIAGNOSIS — L89610 Pressure ulcer of right heel, unstageable: Secondary | ICD-10-CM | POA: Diagnosis not present

## 2018-12-14 DIAGNOSIS — F0390 Unspecified dementia without behavioral disturbance: Secondary | ICD-10-CM | POA: Diagnosis not present

## 2018-12-14 DIAGNOSIS — S32000S Wedge compression fracture of unspecified lumbar vertebra, sequela: Secondary | ICD-10-CM | POA: Diagnosis not present

## 2018-12-14 DIAGNOSIS — M069 Rheumatoid arthritis, unspecified: Secondary | ICD-10-CM | POA: Diagnosis not present

## 2018-12-14 DIAGNOSIS — I5040 Unspecified combined systolic (congestive) and diastolic (congestive) heart failure: Secondary | ICD-10-CM | POA: Diagnosis not present

## 2018-12-14 DIAGNOSIS — I4891 Unspecified atrial fibrillation: Secondary | ICD-10-CM | POA: Diagnosis not present

## 2018-12-15 DIAGNOSIS — L89610 Pressure ulcer of right heel, unstageable: Secondary | ICD-10-CM | POA: Diagnosis not present

## 2018-12-15 DIAGNOSIS — I5040 Unspecified combined systolic (congestive) and diastolic (congestive) heart failure: Secondary | ICD-10-CM | POA: Diagnosis not present

## 2018-12-15 DIAGNOSIS — I4891 Unspecified atrial fibrillation: Secondary | ICD-10-CM | POA: Diagnosis not present

## 2018-12-15 DIAGNOSIS — S32000S Wedge compression fracture of unspecified lumbar vertebra, sequela: Secondary | ICD-10-CM | POA: Diagnosis not present

## 2018-12-15 DIAGNOSIS — F0390 Unspecified dementia without behavioral disturbance: Secondary | ICD-10-CM | POA: Diagnosis not present

## 2018-12-15 DIAGNOSIS — M069 Rheumatoid arthritis, unspecified: Secondary | ICD-10-CM | POA: Diagnosis not present

## 2018-12-16 DIAGNOSIS — S32000S Wedge compression fracture of unspecified lumbar vertebra, sequela: Secondary | ICD-10-CM | POA: Diagnosis not present

## 2018-12-16 DIAGNOSIS — M069 Rheumatoid arthritis, unspecified: Secondary | ICD-10-CM | POA: Diagnosis not present

## 2018-12-16 DIAGNOSIS — I4891 Unspecified atrial fibrillation: Secondary | ICD-10-CM | POA: Diagnosis not present

## 2018-12-16 DIAGNOSIS — F0390 Unspecified dementia without behavioral disturbance: Secondary | ICD-10-CM | POA: Diagnosis not present

## 2018-12-16 DIAGNOSIS — I5040 Unspecified combined systolic (congestive) and diastolic (congestive) heart failure: Secondary | ICD-10-CM | POA: Diagnosis not present

## 2018-12-16 DIAGNOSIS — L89610 Pressure ulcer of right heel, unstageable: Secondary | ICD-10-CM | POA: Diagnosis not present

## 2018-12-19 ENCOUNTER — Ambulatory Visit: Payer: PRIVATE HEALTH INSURANCE | Admitting: Nurse Practitioner

## 2018-12-19 DIAGNOSIS — S32000S Wedge compression fracture of unspecified lumbar vertebra, sequela: Secondary | ICD-10-CM | POA: Diagnosis not present

## 2018-12-19 DIAGNOSIS — I5040 Unspecified combined systolic (congestive) and diastolic (congestive) heart failure: Secondary | ICD-10-CM | POA: Diagnosis not present

## 2018-12-19 DIAGNOSIS — I4891 Unspecified atrial fibrillation: Secondary | ICD-10-CM | POA: Diagnosis not present

## 2018-12-19 DIAGNOSIS — F0390 Unspecified dementia without behavioral disturbance: Secondary | ICD-10-CM | POA: Diagnosis not present

## 2018-12-19 DIAGNOSIS — L89610 Pressure ulcer of right heel, unstageable: Secondary | ICD-10-CM | POA: Diagnosis not present

## 2018-12-19 DIAGNOSIS — M069 Rheumatoid arthritis, unspecified: Secondary | ICD-10-CM | POA: Diagnosis not present

## 2018-12-20 ENCOUNTER — Telehealth: Payer: Self-pay

## 2018-12-20 DIAGNOSIS — M069 Rheumatoid arthritis, unspecified: Secondary | ICD-10-CM | POA: Diagnosis not present

## 2018-12-20 DIAGNOSIS — I5040 Unspecified combined systolic (congestive) and diastolic (congestive) heart failure: Secondary | ICD-10-CM | POA: Diagnosis not present

## 2018-12-20 DIAGNOSIS — I4891 Unspecified atrial fibrillation: Secondary | ICD-10-CM | POA: Diagnosis not present

## 2018-12-20 DIAGNOSIS — S32000S Wedge compression fracture of unspecified lumbar vertebra, sequela: Secondary | ICD-10-CM | POA: Diagnosis not present

## 2018-12-20 DIAGNOSIS — F0390 Unspecified dementia without behavioral disturbance: Secondary | ICD-10-CM | POA: Diagnosis not present

## 2018-12-20 DIAGNOSIS — L89610 Pressure ulcer of right heel, unstageable: Secondary | ICD-10-CM | POA: Diagnosis not present

## 2018-12-20 NOTE — Telephone Encounter (Signed)
I contacted the pt and was able to confirm appt for tomorrow. Pt states she will be able to make the 12 pm appt and check in at 11:30 am.

## 2018-12-20 NOTE — Telephone Encounter (Signed)
I contacted the pt this am and advised we had an opening become available for tomorrow 12/21/18 at 12 pm. Pt advised me this am she would try and find transportation for the visit and to call after 2 pm to verify if transportation was found for appt. I contacted the pt at 315pm and was unable to reach the pt. I advised via vm we have scheduled the appt for tomorrow at 12 pm check in time of 11:30 and if this appt did not work any longer due to transportation to call our office. I will try the pt at a later time to try and discuss as well.

## 2018-12-21 ENCOUNTER — Ambulatory Visit (INDEPENDENT_AMBULATORY_CARE_PROVIDER_SITE_OTHER): Admitting: Neurology

## 2018-12-21 ENCOUNTER — Emergency Department (HOSPITAL_COMMUNITY)

## 2018-12-21 ENCOUNTER — Encounter (HOSPITAL_COMMUNITY): Payer: Self-pay | Admitting: Emergency Medicine

## 2018-12-21 ENCOUNTER — Ambulatory Visit: Admitting: Nurse Practitioner

## 2018-12-21 ENCOUNTER — Encounter: Payer: Self-pay | Admitting: Neurology

## 2018-12-21 ENCOUNTER — Telehealth: Payer: Self-pay

## 2018-12-21 ENCOUNTER — Inpatient Hospital Stay (HOSPITAL_COMMUNITY)
Admission: EM | Admit: 2018-12-21 | Discharge: 2018-12-22 | DRG: 378 | Disposition: A | Discharge status: Home / Self Care | Source: Ambulatory Visit | Attending: Internal Medicine | Admitting: Internal Medicine

## 2018-12-21 ENCOUNTER — Other Ambulatory Visit: Payer: Self-pay

## 2018-12-21 DIAGNOSIS — R627 Adult failure to thrive: Secondary | ICD-10-CM | POA: Diagnosis present

## 2018-12-21 DIAGNOSIS — M1990 Unspecified osteoarthritis, unspecified site: Secondary | ICD-10-CM | POA: Diagnosis present

## 2018-12-21 DIAGNOSIS — I10 Essential (primary) hypertension: Secondary | ICD-10-CM | POA: Diagnosis not present

## 2018-12-21 DIAGNOSIS — Z5329 Procedure and treatment not carried out because of patient's decision for other reasons: Secondary | ICD-10-CM | POA: Diagnosis present

## 2018-12-21 DIAGNOSIS — R001 Bradycardia, unspecified: Secondary | ICD-10-CM | POA: Diagnosis not present

## 2018-12-21 DIAGNOSIS — D631 Anemia in chronic kidney disease: Secondary | ICD-10-CM | POA: Diagnosis present

## 2018-12-21 DIAGNOSIS — R55 Syncope and collapse: Secondary | ICD-10-CM | POA: Diagnosis not present

## 2018-12-21 DIAGNOSIS — M2550 Pain in unspecified joint: Secondary | ICD-10-CM | POA: Diagnosis not present

## 2018-12-21 DIAGNOSIS — G2581 Restless legs syndrome: Secondary | ICD-10-CM | POA: Diagnosis present

## 2018-12-21 DIAGNOSIS — I130 Hypertensive heart and chronic kidney disease with heart failure and stage 1 through stage 4 chronic kidney disease, or unspecified chronic kidney disease: Secondary | ICD-10-CM | POA: Diagnosis present

## 2018-12-21 DIAGNOSIS — R350 Frequency of micturition: Secondary | ICD-10-CM | POA: Diagnosis present

## 2018-12-21 DIAGNOSIS — R0902 Hypoxemia: Secondary | ICD-10-CM | POA: Diagnosis present

## 2018-12-21 DIAGNOSIS — Z8673 Personal history of transient ischemic attack (TIA), and cerebral infarction without residual deficits: Secondary | ICD-10-CM | POA: Diagnosis not present

## 2018-12-21 DIAGNOSIS — Z79891 Long term (current) use of opiate analgesic: Secondary | ICD-10-CM

## 2018-12-21 DIAGNOSIS — L89150 Pressure ulcer of sacral region, unstageable: Secondary | ICD-10-CM | POA: Diagnosis present

## 2018-12-21 DIAGNOSIS — R0602 Shortness of breath: Secondary | ICD-10-CM

## 2018-12-21 DIAGNOSIS — Z79899 Other long term (current) drug therapy: Secondary | ICD-10-CM | POA: Diagnosis not present

## 2018-12-21 DIAGNOSIS — E7800 Pure hypercholesterolemia, unspecified: Secondary | ICD-10-CM | POA: Diagnosis present

## 2018-12-21 DIAGNOSIS — I5040 Unspecified combined systolic (congestive) and diastolic (congestive) heart failure: Secondary | ICD-10-CM | POA: Diagnosis not present

## 2018-12-21 DIAGNOSIS — I5032 Chronic diastolic (congestive) heart failure: Secondary | ICD-10-CM | POA: Diagnosis present

## 2018-12-21 DIAGNOSIS — Z96653 Presence of artificial knee joint, bilateral: Secondary | ICD-10-CM | POA: Diagnosis present

## 2018-12-21 DIAGNOSIS — K922 Gastrointestinal hemorrhage, unspecified: Secondary | ICD-10-CM | POA: Diagnosis present

## 2018-12-21 DIAGNOSIS — N179 Acute kidney failure, unspecified: Secondary | ICD-10-CM | POA: Diagnosis present

## 2018-12-21 DIAGNOSIS — I5042 Chronic combined systolic (congestive) and diastolic (congestive) heart failure: Secondary | ICD-10-CM | POA: Diagnosis present

## 2018-12-21 DIAGNOSIS — I480 Paroxysmal atrial fibrillation: Secondary | ICD-10-CM | POA: Diagnosis present

## 2018-12-21 DIAGNOSIS — M069 Rheumatoid arthritis, unspecified: Secondary | ICD-10-CM | POA: Diagnosis not present

## 2018-12-21 DIAGNOSIS — F5104 Psychophysiologic insomnia: Secondary | ICD-10-CM | POA: Diagnosis present

## 2018-12-21 DIAGNOSIS — Z96611 Presence of right artificial shoulder joint: Secondary | ICD-10-CM | POA: Diagnosis present

## 2018-12-21 DIAGNOSIS — F0390 Unspecified dementia without behavioral disturbance: Secondary | ICD-10-CM | POA: Diagnosis not present

## 2018-12-21 DIAGNOSIS — R410 Disorientation, unspecified: Secondary | ICD-10-CM | POA: Diagnosis not present

## 2018-12-21 DIAGNOSIS — Z809 Family history of malignant neoplasm, unspecified: Secondary | ICD-10-CM

## 2018-12-21 DIAGNOSIS — Z8249 Family history of ischemic heart disease and other diseases of the circulatory system: Secondary | ICD-10-CM

## 2018-12-21 DIAGNOSIS — I639 Cerebral infarction, unspecified: Secondary | ICD-10-CM | POA: Diagnosis present

## 2018-12-21 DIAGNOSIS — M810 Age-related osteoporosis without current pathological fracture: Secondary | ICD-10-CM | POA: Diagnosis present

## 2018-12-21 DIAGNOSIS — S32000S Wedge compression fracture of unspecified lumbar vertebra, sequela: Secondary | ICD-10-CM | POA: Diagnosis not present

## 2018-12-21 DIAGNOSIS — Z66 Do not resuscitate: Secondary | ICD-10-CM | POA: Diagnosis present

## 2018-12-21 DIAGNOSIS — Z823 Family history of stroke: Secondary | ICD-10-CM

## 2018-12-21 DIAGNOSIS — E785 Hyperlipidemia, unspecified: Secondary | ICD-10-CM | POA: Diagnosis present

## 2018-12-21 DIAGNOSIS — I959 Hypotension, unspecified: Secondary | ICD-10-CM | POA: Diagnosis not present

## 2018-12-21 DIAGNOSIS — N183 Chronic kidney disease, stage 3 (moderate): Secondary | ICD-10-CM | POA: Diagnosis present

## 2018-12-21 DIAGNOSIS — Z79890 Hormone replacement therapy (postmenopausal): Secondary | ICD-10-CM | POA: Diagnosis not present

## 2018-12-21 DIAGNOSIS — D638 Anemia in other chronic diseases classified elsewhere: Secondary | ICD-10-CM | POA: Diagnosis present

## 2018-12-21 DIAGNOSIS — Z7401 Bed confinement status: Secondary | ICD-10-CM | POA: Diagnosis not present

## 2018-12-21 DIAGNOSIS — L89610 Pressure ulcer of right heel, unstageable: Secondary | ICD-10-CM | POA: Diagnosis not present

## 2018-12-21 DIAGNOSIS — R413 Other amnesia: Secondary | ICD-10-CM

## 2018-12-21 DIAGNOSIS — D649 Anemia, unspecified: Secondary | ICD-10-CM | POA: Diagnosis present

## 2018-12-21 DIAGNOSIS — R58 Hemorrhage, not elsewhere classified: Secondary | ICD-10-CM | POA: Diagnosis not present

## 2018-12-21 DIAGNOSIS — I4891 Unspecified atrial fibrillation: Secondary | ICD-10-CM | POA: Diagnosis not present

## 2018-12-21 LAB — COMPREHENSIVE METABOLIC PANEL
ALBUMIN: 2.5 g/dL — AB (ref 3.5–5.0)
ALT: 41 U/L (ref 0–44)
AST: 39 U/L (ref 15–41)
Alkaline Phosphatase: 62 U/L (ref 38–126)
Anion gap: 11 (ref 5–15)
BUN: 83 mg/dL — ABNORMAL HIGH (ref 8–23)
CO2: 23 mmol/L (ref 22–32)
Calcium: 8.2 mg/dL — ABNORMAL LOW (ref 8.9–10.3)
Chloride: 100 mmol/L (ref 98–111)
Creatinine, Ser: 2.22 mg/dL — ABNORMAL HIGH (ref 0.44–1.00)
GFR calc Af Amer: 22 mL/min — ABNORMAL LOW (ref >60)
GFR calc non Af Amer: 19 mL/min — ABNORMAL LOW (ref >60)
Glucose, Bld: 110 mg/dL — ABNORMAL HIGH (ref 70–99)
Potassium: 5.1 mmol/L (ref 3.5–5.1)
Sodium: 134 mmol/L — ABNORMAL LOW (ref 135–145)
Total Bilirubin: 0.6 mg/dL (ref 0.3–1.2)
Total Protein: 5.2 g/dL — ABNORMAL LOW (ref 6.5–8.1)

## 2018-12-21 LAB — CBC WITH DIFFERENTIAL/PLATELET
Abs Immature Granulocytes: 0.16 10*3/uL — ABNORMAL HIGH (ref 0.00–0.07)
BASOS ABS: 0 10*3/uL (ref 0.0–0.1)
Basophils Relative: 0 %
Eosinophils Absolute: 0 10*3/uL (ref 0.0–0.5)
Eosinophils Relative: 0 %
HEMATOCRIT: 17.1 % — AB (ref 36.0–46.0)
Hemoglobin: 4.6 g/dL — CL (ref 12.0–15.0)
Immature Granulocytes: 1 %
LYMPHS ABS: 0.4 10*3/uL — AB (ref 0.7–4.0)
LYMPHS PCT: 2 %
MCH: 21 pg — ABNORMAL LOW (ref 26.0–34.0)
MCHC: 26.9 g/dL — ABNORMAL LOW (ref 30.0–36.0)
MCV: 78.1 fL — ABNORMAL LOW (ref 80.0–100.0)
Monocytes Absolute: 0.8 10*3/uL (ref 0.1–1.0)
Monocytes Relative: 4 %
Neutro Abs: 17.4 10*3/uL — ABNORMAL HIGH (ref 1.7–7.7)
Neutrophils Relative %: 93 %
Platelets: 257 10*3/uL (ref 150–400)
RBC: 2.19 MIL/uL — AB (ref 3.87–5.11)
RDW: 19.3 % — ABNORMAL HIGH (ref 11.5–15.5)
WBC: 18.8 10*3/uL — AB (ref 4.0–10.5)
nRBC: 0 % (ref 0.0–0.2)

## 2018-12-21 LAB — PROTIME-INR
INR: 2.52
Prothrombin Time: 26.8 seconds — ABNORMAL HIGH (ref 11.4–15.2)

## 2018-12-21 LAB — LIPASE, BLOOD: Lipase: 27 U/L (ref 11–51)

## 2018-12-21 LAB — BRAIN NATRIURETIC PEPTIDE: B Natriuretic Peptide: 307.7 pg/mL — ABNORMAL HIGH (ref 0.0–100.0)

## 2018-12-21 LAB — APTT: aPTT: 45 seconds — ABNORMAL HIGH (ref 24–36)

## 2018-12-21 LAB — I-STAT TROPONIN, ED: TROPONIN I, POC: 0.06 ng/mL (ref 0.00–0.08)

## 2018-12-21 LAB — PREPARE RBC (CROSSMATCH)

## 2018-12-21 MED ORDER — LORAZEPAM 0.5 MG PO TABS
0.5000 mg | ORAL_TABLET | Freq: Once | ORAL | Status: AC
  Administered 2018-12-21: 0.5 mg via ORAL
  Filled 2018-12-21: qty 1

## 2018-12-21 MED ORDER — ONDANSETRON HCL 4 MG/2ML IJ SOLN: 4.0000 mg | Freq: Four times a day (QID) | INTRAMUSCULAR | Status: DC | PRN

## 2018-12-21 MED ORDER — DICLOFENAC SODIUM 1 % TD GEL
2.0000 g | Freq: Four times a day (QID) | TRANSDERMAL | Status: DC | PRN
  Filled 2018-12-21: qty 100

## 2018-12-21 MED ORDER — SODIUM CHLORIDE 0.9% IV SOLUTION: Freq: Once | INTRAVENOUS | Status: DC

## 2018-12-21 MED ORDER — ONDANSETRON HCL 4 MG PO TABS: 4.0000 mg | ORAL_TABLET | Freq: Four times a day (QID) | ORAL | Status: DC | PRN

## 2018-12-21 MED ORDER — FUROSEMIDE 10 MG/ML IJ SOLN
40.0000 mg | Freq: Once | INTRAMUSCULAR | Status: AC
  Administered 2018-12-21: 40 mg via INTRAVENOUS
  Filled 2018-12-21: qty 4

## 2018-12-21 NOTE — H&P (Signed)
History and Physical    JAMY WHYTE GQQ:[id number] DOB: 1928-02-24 DOA: 12/21/2018  PCP: Lajean Manes, MD  Patient coming from: Home.  Now from neurology office.  I have personally briefly reviewed patient's old medical records available.   Chief Complaint: Frequently passing out.  HPI: LAJOYCE TAMURA is a 85 y.o. female with medical history significant of extensive medical problems including paroxysmal A. fib, chronic diastolic heart failure, chronic insomnia, hypertension, failure to thrive and currently on hospice care under [facility] hospice at independent living facility.  Patient is poor historian.  She says that she really does not want to stay in the hospital.  She says that she has been having episodes where she passes out and is having difficulty getting around.  She went to neurology office and while she was there was found to have low blood pressure and patient was significant nausea vomiting so was sent to the ER. Patient herself is poor historian.  She is really upset about needing for hospital visit.  She denies any hematemesis, hemoptysis, hematochezia or melena.  She denies any urinary changes.  She states he goes more frequent urination because she takes Lasix.  She does not want any procedures, endoscopies or stool testing. ED Course: Blood pressures are stable in the ER.  Patient is on 2 L oxygen.  Hemoglobin is 4.6.  WBC is 18.8, however with no evidence of infection.  Creatinine is 2.2.  Patient was advised 2 units of PRBC transfusion by emergency room physician and she consented for it. I discussed case with [facility], patient currently at independen living and they are trying to get her to assisted living side with hospice.  Since patient does not have enough support at independent living, until arrangements are made, they requested patient to be in the hospital for monitoring and transfusion.  Review of Systems: As per HPI otherwise 10 point review of systems  negative.    Past Medical History:  Diagnosis Date  . Arthritis    severe  . Atrial fibrillation ([place])   . Chronic diastolic CHF (congestive heart failure) ([place])   . Chronic insomnia   . Degenerative arthritis   . Essential hypertension   . Hypercholesteremia   . Memory deficit 05/24/2013  . Osteoporosis   . PAF (paroxysmal atrial fibrillation) ([place])    a. Intolerant to amiodarone, discontinued October 2012.  Marland Kitchen RLS (restless legs syndrome) 08/11/2016  . Sinus bradycardia   . Stroke [facility]) 2007   mini stroke or TIA with aphasia     Past Surgical History:  Procedure Laterality Date  . CATARACT EXTRACTION  2009   ou  . FEMUR FRACTURE SURGERY  8/12  . left knee  2001  . REPLACEMENT TOTAL KNEE BILATERAL Right 1999  . right intertrochanteric hip fracture status post ORIF  August 2012  . right shoulder replacement  2009     reports that she has never smoked. She has never used smokeless tobacco. She reports current alcohol use of about 7.0 standard drinks of alcohol per week. She reports that she does not use drugs.  Allergies  Allergen Reactions  . Codeine Nausea Only    Family History  Problem Relation Age of Onset  . Dementia Mother   . Stroke Mother   . Cancer - Colon Father   . Cancer Father   . Cancer Sister   . Cancer Son   . Cancer Sister   . Coronary artery disease Other  Prior to Admission medications   Medication Sig Start Date End Date Taking? Authorizing Provider  Eszopiclone 3 MG TABS Take 3 mg by mouth at bedtime. Take immediately before bedtime   Yes [provider]  fentaNYL (DURAGESIC) 50 MCG/HR Place 1 patch onto the skin every 3 (three) days.   Yes [provider]  LORazepam (ATIVAN) 0.5 MG tablet Take 0.5 mg by mouth every 8 (eight) hours as needed for anxiety.  07/20/18  Yes [provider]  amiodarone (PACERONE) 200 MG tablet Take 1 tablet (200 mg total) by mouth daily. 08/17/18   Burtis Junes, NP    Cholecalciferol (VITAMIN D-3 PO) Take 1 capsule by mouth daily.    [provider]  Cyanocobalamin (VITAMIN B-12 PO) Take 1 tablet by mouth daily.    [provider]  diclofenac sodium (VOLTAREN) 1 % GEL Apply 4 g topically as needed (shoulder pain, severe pain).    [provider]  furosemide (LASIX) 40 MG tablet Take 1 tablet (40 mg total) by mouth every other day. 08/17/18   Burtis Junes, NP  hydrOXYzine (ATARAX/VISTARIL) 10 MG tablet Take 1 tablet (10 mg total) by mouth 3 (three) times daily as needed for anxiety. 04/11/18   Donne Hazel, MD  isosorbide mononitrate (IMDUR) 30 MG 24 hr tablet Take 1 tablet (30 mg total) by mouth daily. 05/23/18   Burtis Junes, NP  lisinopril (PRINIVIL,ZESTRIL) 5 MG tablet take one by mouth daily 05/23/18   Burtis Junes, NP  Melatonin 3 MG TABS Take 3 tablets (9 mg total) by mouth at bedtime as needed (for sleep). 04/11/18   Donne Hazel, MD  metoprolol succinate (TOPROL-XL) 25 MG 24 hr tablet Take 0.5 tablets (12.5 mg total) by mouth daily. 08/17/18   Burtis Junes, NP  nitroGLYCERIN (NITROSTAT) 0.4 MG SL tablet DISSOLVE 1 TABLET UNDER THE TONGUE EVERY 5 MINUTES AS NEEDED FOR CHEST PAIN 03/14/18   Burtis Junes, NP  RESTASIS 0.05 % ophthalmic emulsion Place 1 drop into both eyes 2 (two) times daily. 03/24/16   [provider]  Rivaroxaban (XARELTO) 15 MG TABS tablet TAKE 1 TABLET(15 MG) BY MOUTH DAILY WITH SUPPER 08/17/18   Burtis Junes, NP  rOPINIRole (REQUIP) 0.25 MG tablet Take 0.25 mg by mouth at bedtime.  07/06/18   [provider]  traMADol (ULTRAM) 50 MG tablet Take 2 tablets (100 mg total) by mouth every 6 (six) hours as needed for severe pain. Patient not taking: Reported on 12/21/2018 04/11/18   Donne Hazel, MD    Physical Exam: Vitals:   12/21/18 1319 12/21/18 1330 12/21/18 1345 12/21/18 1430  BP:  (!) 145/84  128/70  Pulse: 84 73 85   Resp: 12 (!) 27 16 (!) 24  Temp:       TempSrc:      SpO2:  100% 97%     Constitutional: NAD, calm, comfortable Vitals:   12/21/18 1319 12/21/18 1330 12/21/18 1345 12/21/18 1430  BP:  (!) 145/84  128/70  Pulse: 84 73 85   Resp: 12 (!) 27 16 (!) 24  Temp:      TempSrc:      SpO2:  100% 97%    Eyes: PERRL, lids and conjunctivae normal Chronically sick looking.  Thin built. ENMT: Mucous membranes are moist. Posterior pharynx clear of any exudate or lesions.Normal dentition.  Neck: normal, supple, no masses, no thyromegaly Respiratory: clear to auscultation bilaterally, no wheezing, no crackles. Normal  respiratory effort. No accessory muscle use.  Cardiovascular: Regular rate and rhythm, no rubs / gallops.  2+ extremity edema.  Venous stasis changes.  2+ pedal pulses. No carotid bruits.  Abdomen: no tenderness, no masses palpated. No hepatosplenomegaly. Bowel sounds positive.  Musculoskeletal: no clubbing / cyanosis. No joint deformity upper and lower extremities. Good ROM, no contractures. Normal muscle tone.  Skin: no rashes, lesions, ulcers. No induration Neurologic: CN 2-12 grossly intact. Sensation intact, DTR normal. Strength 5/5 in all 4.  Psychiatric: Normal judgment and insight. Alert and oriented x 3.  Anxious.    Labs on Admission: I have personally reviewed following labs and imaging studies  CBC: Recent Labs  Lab 12/21/18 1342  WBC 18.8*  NEUTROABS 17.4*  HGB 4.6*  HCT 17.1*  MCV 78.1*  PLT 562   Basic Metabolic Panel: Recent Labs  Lab 12/21/18 1342  NA 134*  K 5.1  CL 100  CO2 23  GLUCOSE 110*  BUN 83*  CREATININE 2.22*  CALCIUM 8.2*   GFR: CrCl cannot be calculated (Unknown ideal weight.). Liver Function Tests: Recent Labs  Lab 12/21/18 1342  AST 39  ALT 41  ALKPHOS 62  BILITOT 0.6  PROT 5.2*  ALBUMIN 2.5*   Recent Labs  Lab 12/21/18 1342  LIPASE 27   No results for input(s): AMMONIA in the last 168 hours. Coagulation Profile: Recent Labs  Lab 12/21/18 1342  INR  2.52   Cardiac Enzymes: No results for input(s): CKTOTAL, CKMB, CKMBINDEX, TROPONINI in the last 168 hours. BNP (last 3 results) No results for input(s): PROBNP in the last 8760 hours. HbA1C: No results for input(s): HGBA1C in the last 72 hours. CBG: No results for input(s): GLUCAP in the last 168 hours. Lipid Profile: No results for input(s): CHOL, HDL, LDLCALC, TRIG, CHOLHDL, LDLDIRECT in the last 72 hours. Thyroid Function Tests: No results for input(s): TSH, T4TOTAL, FREET4, T3FREE, THYROIDAB in the last 72 hours. Anemia Panel: No results for input(s): VITAMINB12, FOLATE, FERRITIN, TIBC, IRON, RETICCTPCT in the last 72 hours. Urine analysis:    Component Value Date/Time   COLORURINE YELLOW 04/06/2018 2249   APPEARANCEUR CLEAR 04/06/2018 2249   LABSPEC 1.013 04/06/2018 2249   PHURINE 5.0 04/06/2018 2249   GLUCOSEU NEGATIVE 04/06/2018 2249   HGBUR NEGATIVE 04/06/2018 2249   BILIRUBINUR NEGATIVE 04/06/2018 2249   KETONESUR NEGATIVE 04/06/2018 2249   PROTEINUR NEGATIVE 04/06/2018 2249   UROBILINOGEN 0.2 08/16/2011 0227   NITRITE NEGATIVE 04/06/2018 2249   LEUKOCYTESUR NEGATIVE 04/06/2018 2249    Radiological Exams on Admission: Dg Chest 2 View  Result Date: 12/21/2018 CLINICAL DATA:  pt coming from [facility] for follow up on medication reaction. Patient having nausea and vomiting for 2 hours. Patient also had a syncopal episode. EXAM: CHEST - 2 VIEW COMPARISON:  04/06/2018 FINDINGS: Stable linear opacities in the lung bases left greater than right. No new infiltrate or overt edema. Heart size upper limits normal. Aortic Atherosclerosis (ICD10-170.0). No effusion. No pneumothorax. Right shoulder arthroplasty hardware. Advanced DJD in the left shoulder. Stable midthoracic compression deformities. IMPRESSION: No acute cardiopulmonary disease. Electronically Signed   By: Lucrezia Europe M.D.   On: 12/21/2018 15:04    EKG: Independently reviewed.  Sinus rhythm with no acute  changes.  Assessment/Plan Principal Problem:   Anemia in other chronic diseases classified elsewhere Active Problems:   Dyslipidemia   Chronic diastolic CHF (congestive heart failure) (H[facility])   Essential hypertension   PAF (paroxysmal atrial fibrillation) ([place])   Stroke ([place])  Severe anemia     1.  Symptomatic anemia: With episodic syncopal episode.  Unknown source of bleeding.  Patient probably has GI bleeding, she declined to have FOBT tested or to have evaluation for endoscopy. Patient agreed for 2 units of PRBC, that may help with symptoms so we will transfuse. Recheck levels in the morning. No further investigations advised. We will discontinue Xarelto.  2.  Paroxysmal atrial fibrillation: Rate controlled.  Continue amiodarone and metoprolol.  Discontinue Xarelto.  She will not benefit long-term with continuing Xarelto on symptomatic anemia.  3.  Hypertension: Blood pressures are stable.  4.  Multiple comorbidities, failure to thrive, cognitive decline: Patient with hospice care.  She wants to continue hospice care.  She does not want any other investigations.  She does not want hospitalizations.  She clearly stated she is DNR. Discussed with hospice liaison, observation advised until safer place for discharge.   DVT prophylaxis: SCDs. Code Status: DNR/DNI. Family Communication: No family at bedside. Disposition Plan: Assisted living facility when arrangements are made. Consults called: None.  [facility]. Admission status: Observation.   Barb Merino MD [facility] Pager [phone]  If 7PM-7AM, please contact night-coverage [email or web address] Password [facility]  12/21/2018, 3:46 PM

## 2018-12-21 NOTE — Telephone Encounter (Signed)
Patient presented to the clinic today for a re visit with Dr. Jannifer Franklin. Patient was present with aid Marci. Aid stated upon arrival the patient started vomiting and blacking out (for 2-3 seconds), pt also has a pressure sore on sacral area.  Vitals were checked when brought back into the clinic BP was originally 88/44, o2 85 and HR 144. Pt was given a trash can to vomit into and was monitored. MD was advised on vitals and stated it would be best for the pt to report to ED for pt to be evaluated. I called 911 and EMS was sent to clinic. O2 was check 15 minutes after 85 reading was obtained and O2 was 95. BP was rechecked before EMS arrived and BP was 90/34. EMS arrived and loaded pt on to stretcher and transported pt to [facility] ED. Pt will be contacted at a later time to discuss how she is feeling.

## 2018-12-21 NOTE — ED Notes (Signed)
ED TO INPATIENT HANDOFF REPORT  ED Nurse Name and Phone #:  Jenny Reichmann [phone]  S Name/Age/Gender [name] 85 y.o. female Room/Bed: 025C/025C  Code Status   Code Status: DNR  Home/SNF/Other Skilled nursing facility Patient oriented to: self place situation  Is this baseline? Yes   Triage Complete: Triage complete  Chief Complaint syncope  Triage Note Per [facility] pt coming from [facility] for follow up on medication reaction. Patient having nausea and vomiting for 2 hours, given 4mg  zofran and nausea is relieved. Patient also had a syncopal episode. EMS reports patients oxygen saturation in the 80s on arrival, placed on non-rebreather. Pt is a hospice patient.    Allergies Allergies  Allergen Reactions  . Codeine Nausea Only    Level of Care/Admitting Diagnosis ED Disposition    ED Disposition Condition [facility] Area: [place] [[id number]]  Level of Care: Med-Surg [16]  Diagnosis: Severe anemia [[id number]]  Admitting Physician: Barb Merino [[id number]]  Attending Physician: Barb Merino [[id number]]  Estimated length of stay: past midnight tomorrow  Certification:: I certify this patient will need inpatient services for at least 2 midnights  PT Class (Do Not Modify): Inpatient [101]  PT Acc Code (Do Not Modify): Private [1]       B Medical/Surgery History Past Medical History:  Diagnosis Date  . Arthritis    severe  . Atrial fibrillation ([place])   . Chronic diastolic CHF (congestive heart failure) ([place])   . Chronic insomnia   . Degenerative arthritis   . Essential hypertension   . Hypercholesteremia   . Memory deficit 05/24/2013  . Osteoporosis   . PAF (paroxysmal atrial fibrillation) ([place])    a. Intolerant to amiodarone, discontinued October 2012.  Marland Kitchen RLS (restless legs syndrome) 08/11/2016  . Sinus bradycardia   . Stroke [facility]) 2007   mini stroke or TIA with aphasia    Past Surgical History:  Procedure Laterality  Date  . CATARACT EXTRACTION  2009   ou  . FEMUR FRACTURE SURGERY  8/12  . left knee  2001  . REPLACEMENT TOTAL KNEE BILATERAL Right 1999  . right intertrochanteric hip fracture status post ORIF  August 2012  . right shoulder replacement  2009     A IV Location/Drains/Wounds Patient Lines/Drains/Airways Status   Active Line/Drains/Airways    Name:   Placement date:   Placement time:   Site:   Days:   Peripheral IV 12/21/18 Left Wrist   12/21/18    1312    Wrist   less than 1   External Urinary Catheter   12/21/18    1419    -   less than 1   Wound / Incision (Open or Dehisced) 04/06/18 Non-pressure wound Leg Right;Anterior healed, scabbed wound   04/06/18    1900    Leg   259          Intake/Output Last 24 hours  Intake/Output Summary (Last 24 hours) at 12/21/2018 1750 Last data filed at 12/21/2018 1656 Gross per 24 hour  Intake 315 ml  Output -  Net 315 ml    Labs/Imaging Results for orders placed or performed during the hospital encounter of 12/21/18 (from the past 48 hour(s))  CBC with Differential     Status: Abnormal   Collection Time: 12/21/18  1:42 PM  Result Value Ref Range   WBC 18.8 (H) 4.0 - 10.5 K/uL   RBC 2.19 (L) 3.87 - 5.11 MIL/uL   Hemoglobin  4.6 (LL) 12.0 - 15.0 g/dL    Comment: REPEATED TO VERIFY THIS CRITICAL RESULT HAS VERIFIED AND BEEN CALLED TO BARBER,M RN BY AMANDA LEONARD ON 02 19 2020 AT 1406, AND HAS BEEN READ BACK.     HCT 17.1 (L) 36.0 - 46.0 %   MCV 78.1 (L) 80.0 - 100.0 fL   MCH 21.0 (L) 26.0 - 34.0 pg   MCHC 26.9 (L) 30.0 - 36.0 g/dL   RDW 19.3 (H) 11.5 - 15.5 %   Platelets 257 150 - 400 K/uL   nRBC 0.0 0.0 - 0.2 %   Neutrophils Relative % 93 %   Neutro Abs 17.4 (H) 1.7 - 7.7 K/uL   Lymphocytes Relative 2 %   Lymphs Abs 0.4 (L) 0.7 - 4.0 K/uL   Monocytes Relative 4 %   Monocytes Absolute 0.8 0.1 - 1.0 K/uL   Eosinophils Relative 0 %   Eosinophils Absolute 0.0 0.0 - 0.5 K/uL   Basophils Relative 0 %   Basophils Absolute 0.0 0.0 -  0.1 K/uL   Immature Granulocytes 1 %   Abs Immature Granulocytes 0.16 (H) 0.00 - 0.07 K/uL    Comment: Performed at [facility] [address], [place], [place] [address]  Comprehensive metabolic panel     Status: Abnormal   Collection Time: 12/21/18  1:42 PM  Result Value Ref Range   Sodium 134 (L) 135 - 145 mmol/L   Potassium 5.1 3.5 - 5.1 mmol/L   Chloride 100 98 - 111 mmol/L   CO2 23 22 - 32 mmol/L   Glucose, Bld 110 (H) 70 - 99 mg/dL   BUN 83 (H) 8 - 23 mg/dL   Creatinine, Ser 2.22 (H) 0.44 - 1.00 mg/dL   Calcium 8.2 (L) 8.9 - 10.3 mg/dL   Total Protein 5.2 (L) 6.5 - 8.1 g/dL   Albumin 2.5 (L) 3.5 - 5.0 g/dL   AST 39 15 - 41 U/L   ALT 41 0 - 44 U/L   Alkaline Phosphatase 62 38 - 126 U/L   Total Bilirubin 0.6 0.3 - 1.2 mg/dL   GFR calc non Af Amer 19 (L) >60 mL/min   GFR calc Af Amer 22 (L) >60 mL/min   Anion gap 11 5 - 15    Comment: Performed at [facility], [place] [address], [place], [place] [address]  Lipase, blood     Status: None   Collection Time: 12/21/18  1:42 PM  Result Value Ref Range   Lipase 27 11 - 51 U/L    Comment: Performed at [facility], [place] [address], [place], [place] [address]  Protime-INR     Status: Abnormal   Collection Time: 12/21/18  1:42 PM  Result Value Ref Range   Prothrombin Time 26.8 (H) 11.4 - 15.2 seconds   INR 2.52     Comment: Performed at [facility] [address] [place], [place] [address]  APTT     Status: Abnormal   Collection Time: 12/21/18  1:42 PM  Result Value Ref Range   aPTT 45 (H) 24 - 36 seconds    Comment:        IF BASELINE aPTT IS ELEVATED, SUGGEST PATIENT RISK ASSESSMENT BE USED TO DETERMINE APPROPRIATE ANTICOAGULANT THERAPY. Performed at [facility], [place] [address] [place], [place] [address]   Brain natriuretic peptide     Status: Abnormal   Collection Time: 12/21/18  1:44 PM  Result Value Ref  Range   B Natriuretic Peptide 307.7 (H) 0.0 - 100.0 pg/mL    Comment: Performed  at [facility] [address], [place], [place] [address]  I-Stat Troponin, ED (not at [facility])     Status: None   Collection Time: 12/21/18  1:56 PM  Result Value Ref Range   Troponin i, poc 0.06 0.00 - 0.08 ng/mL   Comment 3            Comment: Due to the release kinetics of cTnI, a negative result within the first hours of the onset of symptoms does not rule out myocardial infarction with certainty. If myocardial infarction is still suspected, repeat the test at appropriate intervals.   Type and screen [facility]     Status: None (Preliminary result)   Collection Time: 12/21/18  3:00 PM  Result Value Ref Range   ABO/RH(D) O POS    Antibody Screen NEG    Sample Expiration 12/24/2018    Unit Number [id number]    Blood Component Type RED CELLS,LR    Unit division 00    Status of Unit ISSUED    Transfusion Status OK TO TRANSFUSE    Crossmatch Result      Compatible Performed at [facility], [place] [address] [place], [place] [address]    Unit Number [id number]    Blood Component Type RED CELLS,LR    Unit division 00    Status of Unit ALLOCATED    Transfusion Status OK TO TRANSFUSE    Crossmatch Result Compatible   Prepare RBC     Status: None   Collection Time: 12/21/18  3:00 PM  Result Value Ref Range   Order Confirmation      ORDER PROCESSED BY BLOOD BANK Performed at [facility], [place] [address] [place], [place] [address]    Dg Chest 2 View  Result Date: 12/21/2018 CLINICAL DATA:  pt coming from [facility] for follow up on medication reaction. Patient having nausea and vomiting for 2 hours. Patient also had a syncopal episode. EXAM: CHEST - 2 VIEW COMPARISON:  04/06/2018 FINDINGS: Stable linear opacities in the lung bases left greater than right. No new infiltrate or overt edema. Heart size upper limits normal. Aortic Atherosclerosis (ICD10-170.0). No effusion. No pneumothorax. Right shoulder arthroplasty hardware.  Advanced DJD in the left shoulder. Stable midthoracic compression deformities. IMPRESSION: No acute cardiopulmonary disease. Electronically Signed   By: Lucrezia Europe M.D.   On: 12/21/2018 15:04    Pending Labs Unresulted Labs (From admission, onward)    Start     Ordered   12/22/18 0500  CBC  Tomorrow morning,   R     12/21/18 1701          Vitals/Pain Today's Vitals   12/21/18 1630 12/21/18 1656 12/21/18 1700 12/21/18 1711  BP: (!) 105/91 (!) 122/46 (!) 111/54 (!) 112/49  Pulse: 80 83 86 77  Resp: 16 19 (!) 23 (!) 21  Temp:  98 F (36.7 C)  97.8 F (36.6 C)  TempSrc:  Oral  Oral  SpO2: 97% 96%  100%  PainSc:        Isolation Precautions No active isolations  Medications Medications  0.9 %  sodium chloride infusion (Manually program via Guardrails IV Fluids) (has no administration in time range)  ondansetron (ZOFRAN) tablet 4 mg (has no administration in time range)    Or  ondansetron (ZOFRAN) injection 4 mg (has no administration in time range)  furosemide (LASIX) injection 40 mg (40 mg Intravenous Given 12/21/18 1636)    Mobility power wheelchair Moderate fall risk   Focused Assessments Neuro Assessment Handoff:  Swallow screen pass? Yes    NIH Stroke Scale ( + Modified Stroke Scale Criteria)  Interval: Initial Level of Consciousness (1a.)   : Alert, keenly responsive LOC Questions (1b. )   +: Answers both questions correctly LOC Commands (1c. )   + : Performs both tasks correctly Best Gaze (2. )  +: Normal Visual (3. )  +: No visual loss Facial Palsy (4. )    : Normal symmetrical movements Motor Arm, Left (5a. )   +: No drift Motor Arm, Right (5b. )   +: No drift Motor Leg, Left (6a. )   +: No drift Motor Leg, Right (6b. )   +: No drift Limb Ataxia (7. ): Absent Sensory (8. )   +: Normal, no sensory loss Best Language (9. )   +: No aphasia Dysarthria (10. ): Normal Extinction/Inattention (11.)   +: No Abnormality Modified SS Total  +: 0 Complete NIHSS  TOTAL: 0     Neuro Assessment: Within Defined Limits Neuro Checks:   Initial (12/21/18 1326)  Last Documented NIHSS Modified Score: 0 (12/21/18 1326) Has TPA been given? No If patient is a Neuro Trauma and patient is going to OR before floor call report to [place] nurse: [phone] or [phone]     R Recommendations: See Admitting Provider Note  Report given to:   Additional Notes:  Pt a Hospice pt from assisted living and will most likely need to d/c to SNF.  Hospice will follow pt and assist with discharge planning.  Pt has son at bedside

## 2018-12-21 NOTE — Progress Notes (Deleted)
CARDIOLOGY OFFICE NOTE  Date:  12/21/2018    [name] Date of Birth: 17-Aug-1928 Medical Record #[id number]  PCP:  Lajean Manes, MD  Cardiologist:  Servando Snare & ***    No chief complaint on file.   History of Present Illness: [name] is a 85 y.o. female who presents today for a ***  for Dr. Lovena Le. She has primarily followed with me. I used to take care of her husband many years ago when he was a patient of Dr. Susa Simmonds.   She has had a history of PAF - on chronic anticoagulation, chronic diastolic HF, HTN, HLD, OA, prior CVA, and RA.   Her anticoagulation was interrupted in 2018 due to profound anemia. She has had trouble tolerating CCB therapy in the past due to significant lower extremity edema. She has previously been on low dose amiodarone for her PAF to maintain NSR and prevent heart failure exacerbations.   Her care has been difficult to provide due to issues with transportation.    She was admitted back in early June - found to be in AF with RVR. She required diuresis and was going to have cardioversion but converted on her own. Seen by Dr. Acie Fredrickson at that time.  I then saw her in follow up - she was back home - her sons were moving her to [place] and she was not happy with this - she really wanted to stay in her home with her help that she had hired. Her medicines seemed unclear. She is severely limited by her orthopedic issues.   Comes in today. Here[place]. She is in a wheelchair. She is now at [facility]. Has been there about 3 weeks. She is not happy with this. She feels very alone. She wishes she could come back home but that will not be happening. She is in the assisted living - basically is on her own. She still does her own medicines. Sounds like the plan is to try to get to [facility] - she is on the waiting list there. She is not driving. Not able to go to the pharmacy. She is asking about getting an Xray - her back is hurting. She is wondering  about her medicines - what she should be on, etc. She has a bruise on her forehead - she hit the freezer in her new kitchen.  Comes in today. Here with   Past Medical History:  Diagnosis Date  . Arthritis    severe  . Atrial fibrillation ([place])   . Chronic diastolic CHF (congestive heart failure) ([place])   . Chronic insomnia   . Degenerative arthritis   . Essential hypertension   . Hypercholesteremia   . Memory deficit 05/24/2013  . Osteoporosis   . PAF (paroxysmal atrial fibrillation) ([place])    a. Intolerant to amiodarone, discontinued October 2012.  Marland Kitchen RLS (restless legs syndrome) 08/11/2016  . Sinus bradycardia   . Stroke [facility]) 2007   mini stroke or TIA with aphasia     Past Surgical History:  Procedure Laterality Date  . CATARACT EXTRACTION  2009   ou  . FEMUR FRACTURE SURGERY  8/12  . left knee  2001  . REPLACEMENT TOTAL KNEE BILATERAL Right 1999  . right intertrochanteric hip fracture status post ORIF  August 2012  . right shoulder replacement  2009     Medications: No outpatient medications have been marked as taking for the 12/21/18 encounter (Appointment) with Burtis Junes, NP.  Allergies: Allergies  Allergen Reactions  . Codeine Nausea Only    Social History: The patient  reports that she has never smoked. She has never used smokeless tobacco. She reports current alcohol use of about 7.0 standard drinks of alcohol per week. She reports that she does not use drugs.   Family History: The patient's ***family history includes Cancer in her father, sister, sister, and son; Cancer - Colon in her father; Coronary artery disease in an other family member; Dementia in her mother; Stroke in her mother.   Review of Systems: Please see the history of present illness.   Otherwise, the review of systems is positive for {NONE DEFAULTED:18576::"none"}.   All other systems are reviewed and negative.   Physical Exam: VS:  There were no vitals taken for this visit. Marland Kitchen   BMI There is no height or weight on file to calculate BMI.  Wt Readings from Last 3 Encounters:  08/17/18 102 lb (46.3 kg)  05/23/18 106 lb 6.4 oz (48.3 kg)  05/03/18 110 lb (49.9 kg)    General: Pleasant. Well developed, well nourished and in no acute distress.   HEENT: Normal.  Neck: Supple, no JVD, carotid bruits, or masses noted.  Cardiac: ***Regular rate and rhythm. No murmurs, rubs, or gallops. No edema.  Respiratory:  Lungs are clear to auscultation bilaterally with normal work of breathing.  GI: Soft and nontender.  MS: No deformity or atrophy. Gait and ROM intact.  Skin: Warm and dry. Color is normal.  Neuro:  Strength and sensation are intact and no gross focal deficits noted.  Psych: Alert, appropriate and with normal affect.   LABORATORY DATA:  EKG:  EKG {ACTION; IS/IS GMW:10272536} ordered today. This demonstrates ***.  Lab Results  Component Value Date   WBC 6.7 05/23/2018   HGB 10.8 (L) 05/23/2018   HCT 33.1 (L) 05/23/2018   PLT 220 05/23/2018   GLUCOSE 87 05/23/2018   ALT 15 10/13/2017   AST 24 10/13/2017   NA 136 05/23/2018   K 3.8 05/23/2018   CL 90 (L) 05/23/2018   CREATININE 1.04 (H) 05/23/2018   BUN 27 05/23/2018   CO2 28 05/23/2018   TSH 0.837 04/06/2018   INR 1.26 05/26/2016     BNP (last 3 results) Recent Labs    04/06/18 0240  BNP 388.3*    ProBNP (last 3 results) No results for input(s): PROBNP in the last 8760 hours.   Other Studies Reviewed Today:   Assessment/Plan: Echocardiogram 04/06/2018 Study Conclusions  - Left ventricle: The cavity size was normal. Wall thickness was increased in a pattern of mild LVH. Systolic function was mildly to moderately reduced. The estimated ejection fraction was in the range of 40% to 45%. Diffuse hypokinesis. The study is not technically sufficient to allow evaluation of LV diastolic function. - Aortic valve: Sclerosis without stenosis. There was trivial regurgitation. -  Mitral valve: Mildly thickened leaflets . There was mild regurgitation. - Left atrium: Severely dilated. - Atrial septum: No defect or patent foramen ovale was identified. - Tricuspid valve: There was trivial regurgitation. - Pulmonary arteries: PA peak pressure: 28 mm Hg (S). - Inferior vena cava: The vessel was normal in size. The respirophasic diameter changes were in the normal range (>= 50%), consistent with normal central venous pressure.  Impressions:  - Compared to a prior study in 2016, the LVEF is lower at 40-45% with global hypokinesis, trivial AI, mild MR and severe LAE.     Assessment/Plan:  1. PAF -  she had an exacerbation of RVR back in the summer - she was placed on higher doses of her amiodarone short term - now back to 200 mg a day. Remains in NSR.   2. History of bradycardia - HR is ok today - I have left her on her current regimen.   3. Chronic combined systolic and diastolic HF - EF now lower at 40 to 45% ?if this was related to her AF with RVR and was more of a tachycardia mediated event - she has been placed on less diuretic per PCP. We may entertain a repeat echo at some point but at this time she is stable from our standpoint.   4. Chest pain - her last discharge summary noted "possible ischemic work up if EF failed to improve but could consider medical management given her frail state" - she and I have both opted for conservative management. She has no active chest pain.   5. RA - quite debilitating for her.   6. Leg wound - not discussed today.   39. Advanced age - now at assisted living. She is not happy. Hopefully she will be adjust.   Current medicines are reviewed with the patient today.  The patient does not have concerns regarding medicines other than what has been noted above.  The following changes have been made:  See above.  Labs/ tests ordered today include:   No orders of the defined types were placed in this  encounter.    Disposition:   FU with *** in {gen number 6-73:419379} {Days to years:10300}.   Patient is agreeable to this plan and will call if any problems develop in the interim.   SignedTruitt Merle, NP  12/21/2018 7:12 AM  [facility] [address] [place], [place]  [address] Phone: [phone] Fax: [phone]

## 2018-12-21 NOTE — Telephone Encounter (Signed)
Error

## 2018-12-21 NOTE — ED Triage Notes (Signed)
Per [facility] pt coming from [facility] for follow up on medication reaction. Patient having nausea and vomiting for 2 hours, given 4mg  zofran and nausea is relieved. Patient also had a syncopal episode. EMS reports patients oxygen saturation in the 80s on arrival, placed on non-rebreather. Pt is a hospice patient.

## 2018-12-21 NOTE — Progress Notes (Signed)
[facility]  Spoke with home care RN, pt adamant about not going to higher level of care at facility or going to ED in spite of several syncopal episodes.  Pt did elect to go to ED.  Spoke with admitting MD, pt hgb 4.8, going to receive 2 UPRBCs.  Discussed pt resistance to moving to the ALF at [facility].    Spoke with son [name], advised she needs to be admitted until we can figure out where she can go.  He had just spoken with [facility] and they have a bed for her on the ALF side. She will need a higher level of care than the ILF she is currently at.  She has been adamant about not transitioning.  Hospice will continue to follow and work on discharge planning.  Please call with any hospice related questions.  Venia Carbon BSN, RN [employer] [phone]

## 2018-12-21 NOTE — Progress Notes (Signed)
The patient came to the office today for revisit, unfortunately, she has had significant nausea and vomiting, she is running blood pressures in the 99-83 systolic range, she has been going in and out of consciousness, fainting several times with episodic vomiting.  She has initially refused to go to the emergency room, she has agreed to be evaluated in the ER and receive IV fluids, she does not wish to have an admission to the hospital.  The patient currently lives at [facility], she is under hospice care.  She apparently is still in independent living.  She is to stop the Exelon capsules.   We will get another revisit set up in a couple weeks.

## 2018-12-21 NOTE — ED Notes (Signed)
Patient transported to X-ray 

## 2018-12-21 NOTE — ED Provider Notes (Signed)
[place] EMERGENCY DEPARTMENT Provider Note   CSN: [id number] Arrival date & time: 12/21/18  1304    History   Chief Complaint Chief Complaint  Patient presents with  . Loss of Consciousness    HPI [name] is a 85 y.o. female.     The history is provided by the patient and medical records.  Loss of Consciousness  Episode history:  Multiple Most recent episode:  Today Timing:  Intermittent Progression:  Waxing and waning Chronicity:  New Witnessed: yes   Relieved by:  Nothing Worsened by:  Nothing Ineffective treatments:  None tried Associated symptoms: difficulty breathing, malaise/fatigue, nausea, shortness of breath and vomiting   Associated symptoms: no chest pain, no diaphoresis, no fever, no focal weakness, no headaches, no palpitations, no recent fall, no recent surgery, no rectal bleeding, no seizures and no visual change     Past Medical History:  Diagnosis Date  . Arthritis    severe  . Atrial fibrillation ([employer])   . Chronic diastolic CHF (congestive heart failure) ([place])   . Chronic insomnia   . Degenerative arthritis   . Essential hypertension   . Hypercholesteremia   . Memory deficit 05/24/2013  . Osteoporosis   . PAF (paroxysmal atrial fibrillation) ([place])    a. Intolerant to amiodarone, discontinued October 2012.  Marland Kitchen RLS (restless legs syndrome) 08/11/2016  . Sinus bradycardia   . Stroke [facility]) 2007   mini stroke or TIA with aphasia     Patient Active Problem List   Diagnosis Date Noted  . Atypical atrial flutter ([place])   . CHF (congestive heart failure) ([place]) 04/06/2018  . Hyponatremia 04/06/2018  . Acute respiratory failure ([place]) 04/06/2018  . Normocytic normochromic anemia 04/06/2018  . RLS (restless legs syndrome) 08/11/2016  . Demand ischemia ([place]) 05/27/2016  . Acute on chronic diastolic congestive heart failure ([place]) 05/26/2016  . Atrial fibrillation with RVR ([place]) 05/26/2016  . Insomnia 01/22/2016  . Sinus  bradycardia   . Chronic diastolic CHF (congestive heart failure) ([place])   . Essential hypertension   . PAF (paroxysmal atrial fibrillation) ([place])   . Stroke ([place])   . Memory deficit 05/24/2013  . Arthritis 10/13/2011  . Long term (current) use of anticoagulants 07/21/2011  . Dyslipidemia 07/10/2011    Past Surgical History:  Procedure Laterality Date  . CATARACT EXTRACTION  2009   ou  . FEMUR FRACTURE SURGERY  8/12  . left knee  2001  . REPLACEMENT TOTAL KNEE BILATERAL Right 1999  . right intertrochanteric hip fracture status post ORIF  August 2012  . right shoulder replacement  2009     OB History   No obstetric history on file.      Home Medications    Prior to Admission medications   Medication Sig Start Date End Date Taking? Authorizing Provider  amiodarone (PACERONE) 200 MG tablet Take 1 tablet (200 mg total) by mouth daily. 08/17/18   Burtis Junes, NP  Cholecalciferol (VITAMIN D-3 PO) Take 1 capsule by mouth daily.    [provider]  Cyanocobalamin (VITAMIN B-12 PO) Take 1 tablet by mouth daily.    [provider]  diclofenac sodium (VOLTAREN) 1 % GEL Apply 4 g topically as needed (shoulder pain, severe pain).    [provider]  Eszopiclone 3 MG TABS Take 3 mg by mouth at bedtime. Take immediately before bedtime    [provider]  furosemide (LASIX) 40 MG tablet Take 1 tablet (40 mg total)  by mouth every other day. 08/17/18   Burtis Junes, NP  hydrOXYzine (ATARAX/VISTARIL) 10 MG tablet Take 1 tablet (10 mg total) by mouth 3 (three) times daily as needed for anxiety. 04/11/18   Donne Hazel, MD  isosorbide mononitrate (IMDUR) 30 MG 24 hr tablet Take 1 tablet (30 mg total) by mouth daily. 05/23/18   Burtis Junes, NP  lisinopril (PRINIVIL,ZESTRIL) 5 MG tablet take one by mouth daily 05/23/18   Burtis Junes, NP  LORazepam (ATIVAN) 0.5 MG tablet Take 0.5 mg by mouth every 8 (eight) hours as needed for anxiety.  07/20/18    [provider]  Melatonin 3 MG TABS Take 3 tablets (9 mg total) by mouth at bedtime as needed (for sleep). 04/11/18   Donne Hazel, MD  metoprolol succinate (TOPROL-XL) 25 MG 24 hr tablet Take 0.5 tablets (12.5 mg total) by mouth daily. 08/17/18   Burtis Junes, NP  nitroGLYCERIN (NITROSTAT) 0.4 MG SL tablet DISSOLVE 1 TABLET UNDER THE TONGUE EVERY 5 MINUTES AS NEEDED FOR CHEST PAIN 03/14/18   Burtis Junes, NP  RESTASIS 0.05 % ophthalmic emulsion Place 1 drop into both eyes 2 (two) times daily. 03/24/16   [provider]  Rivaroxaban (XARELTO) 15 MG TABS tablet TAKE 1 TABLET(15 MG) BY MOUTH DAILY WITH SUPPER 08/17/18   Burtis Junes, NP  rOPINIRole (REQUIP) 0.25 MG tablet Take 0.25 mg by mouth at bedtime.  07/06/18   [provider]  traMADol (ULTRAM) 50 MG tablet Take 2 tablets (100 mg total) by mouth every 6 (six) hours as needed for severe pain. 04/11/18   Donne Hazel, MD    Family History Family History  Problem Relation Age of Onset  . Dementia Mother   . Stroke Mother   . Cancer - Colon Father   . Cancer Father   . Cancer Sister   . Cancer Son   . Cancer Sister   . Coronary artery disease Other     Social History Social History   Tobacco Use  . Smoking status: Never Smoker  . Smokeless tobacco: Never Used  Substance Use Topics  . Alcohol use: Yes    Alcohol/week: 7.0 standard drinks    Types: 7 Glasses of wine per week    Comment: 1 glass wine with dinner some days  . Drug use: No     Allergies   Codeine   Review of Systems Review of Systems  Constitutional: Positive for fatigue and malaise/fatigue. Negative for chills, diaphoresis and fever.  HENT: Negative for congestion.   Respiratory: Positive for shortness of breath. Negative for cough, chest tightness, wheezing and stridor.   Cardiovascular: Positive for leg swelling and syncope. Negative for chest pain and palpitations.  Gastrointestinal: Positive for nausea and  vomiting. Negative for abdominal pain, constipation and diarrhea.  Genitourinary: Negative for dysuria and flank pain.  Musculoskeletal: Negative for back pain, neck pain and neck stiffness.  Skin: Negative for rash and wound.  Neurological: Positive for syncope and light-headedness. Negative for focal weakness, seizures and headaches.  Psychiatric/Behavioral: Negative for agitation.     Physical Exam Updated Vital Signs BP (!) 112/49   Pulse 77   Temp 97.8 F (36.6 C) (Oral)   Resp (!) 21   SpO2 100%   Physical Exam Vitals signs and nursing note reviewed.  Constitutional:      General: She is not in acute distress.    Appearance: She is well-developed. She is not ill-appearing,  toxic-appearing or diaphoretic.  HENT:     Head: Normocephalic and atraumatic.     Nose: No congestion or rhinorrhea.     Mouth/Throat:     Pharynx: No oropharyngeal exudate or posterior oropharyngeal erythema.  Eyes:     Extraocular Movements: Extraocular movements intact.     Conjunctiva/sclera: Conjunctivae normal.     Pupils: Pupils are equal, round, and reactive to light.  Neck:     Musculoskeletal: Neck supple. No muscular tenderness.  Cardiovascular:     Rate and Rhythm: Regular rhythm. Tachycardia present.     Pulses: Normal pulses.     Heart sounds: Murmur present.  Pulmonary:     Effort: Pulmonary effort is normal. No respiratory distress.     Breath sounds: Rales present. No wheezing or rhonchi.  Chest:     Chest wall: No tenderness.  Abdominal:     General: Abdomen is flat. There is no distension.     Palpations: Abdomen is soft.     Tenderness: There is no abdominal tenderness. There is no right CVA tenderness or left CVA tenderness.  Musculoskeletal:        General: No tenderness.     Right lower leg: Edema present.     Left lower leg: Edema present.  Skin:    General: Skin is warm and dry.     Capillary Refill: Capillary refill takes less than 2 seconds.     Coloration:  Skin is pale.  Neurological:     General: No focal deficit present.     Mental Status: She is alert.  Psychiatric:        Mood and Affect: Mood normal.      ED Treatments / Results  Labs (all labs ordered are listed, but only abnormal results are displayed) Labs Reviewed  CBC WITH DIFFERENTIAL/PLATELET - Abnormal; Notable for the following components:      Result Value   WBC 18.8 (*)    RBC 2.19 (*)    Hemoglobin 4.6 (*)    HCT 17.1 (*)    MCV 78.1 (*)    MCH 21.0 (*)    MCHC 26.9 (*)    RDW 19.3 (*)    Neutro Abs 17.4 (*)    Lymphs Abs 0.4 (*)    Abs Immature Granulocytes 0.16 (*)    All other components within normal limits  COMPREHENSIVE METABOLIC PANEL - Abnormal; Notable for the following components:   Sodium 134 (*)    Glucose, Bld 110 (*)    BUN 83 (*)    Creatinine, Ser 2.22 (*)    Calcium 8.2 (*)    Total Protein 5.2 (*)    Albumin 2.5 (*)    GFR calc non Af Amer 19 (*)    GFR calc Af Amer 22 (*)    All other components within normal limits  BRAIN NATRIURETIC PEPTIDE - Abnormal; Notable for the following components:   B Natriuretic Peptide 307.7 (*)    All other components within normal limits  PROTIME-INR - Abnormal; Notable for the following components:   Prothrombin Time 26.8 (*)    All other components within normal limits  APTT - Abnormal; Notable for the following components:   aPTT 45 (*)    All other components within normal limits  LIPASE, BLOOD  CBC  I-STAT TROPONIN, ED  TYPE AND SCREEN  PREPARE RBC (CROSSMATCH)    EKG EKG Interpretation  Date/Time:  Wednesday December 21 2018 13:15:10 EST Ventricular Rate:  85 PR Interval:  QRS Duration: 92 QT Interval:  366 QTC Calculation: 436 R Axis:   15 Text Interpretation:  Sinus rhythm Prolonged PR interval RSR' in V1 or V2, probably normal variant Consider left ventricular hypertrophy ST elevation, consider anterior injury When compared to prior, no significant changes seen.  No STEMI  Confirmed by Antony Blackbird [phone]) on 12/21/2018 1:20:53 PM   Radiology Dg Chest 2 View  Result Date: 12/21/2018 CLINICAL DATA:  pt coming from [facility] for follow up on medication reaction. Patient having nausea and vomiting for 2 hours. Patient also had a syncopal episode. EXAM: CHEST - 2 VIEW COMPARISON:  04/06/2018 FINDINGS: Stable linear opacities in the lung bases left greater than right. No new infiltrate or overt edema. Heart size upper limits normal. Aortic Atherosclerosis (ICD10-170.0). No effusion. No pneumothorax. Right shoulder arthroplasty hardware. Advanced DJD in the left shoulder. Stable midthoracic compression deformities. IMPRESSION: No acute cardiopulmonary disease. Electronically Signed   By: Lucrezia Europe M.D.   On: 12/21/2018 15:04    Procedures Procedures (including critical care time)  CRITICAL CARE Performed by: Gwenyth Allegra Zyann Mabry Total critical care time: 45 minutes Critical care time was exclusive of separately billable procedures and treating other patients. Critical care was necessary to treat or prevent imminent or life-threatening deterioration. Critical care was time spent personally by me on the following activities: development of treatment plan with patient and/or surrogate as well as nursing, discussions with consultants, evaluation of patient's response to treatment, examination of patient, obtaining history from patient or surrogate, ordering and performing treatments and interventions, ordering and review of laboratory studies, ordering and review of radiographic studies, pulse oximetry and re-evaluation of patient's condition.   Medications Ordered in ED Medications  0.9 %  sodium chloride infusion (Manually program via Guardrails IV Fluids) (has no administration in time range)  ondansetron (ZOFRAN) tablet 4 mg (has no administration in time range)    Or  ondansetron (ZOFRAN) injection 4 mg (has no administration in time range)    furosemide (LASIX) injection 40 mg (40 mg Intravenous Given 12/21/18 1636)     Initial Impression / Assessment and Plan / ED Course  I have reviewed the triage vital signs and the nursing notes.  Pertinent labs & imaging results that were available during my care of the patient were reviewed by me and considered in my medical decision making (see chart for details).        [name] is a 85 y.o. female with a past medical history significant paroxysmal atrial fibrillation on Xarelto, stroke, CHF, hypertension, dyslipidemia and currently on hospice who presents from her neurology appointment for nausea, vomiting, multiple syncopal episodes, hypotension, and hypoxia.  Patient reports that she has been feeling confused for the last week or 2 and was seeing her neurologist today when she had multiple syncopal episodes.  Patient was found to be hypoxic with oxygen saturations in the 80s.  Patient takes some oxygen at home.  Patient was having nausea, vomiting, and syncopized multiple times with them.  Patient was sent to the ED for evaluation.  Patient denies any chest pain or palpitations.  She does report some shortness of breath.  She does not like using oxygen.  She denies any current nausea or vomiting on my initial evaluation.  Abdomen was nontender and chest was nontender.  She denied any rectal bleeding and no dark tarry stools.  No emesis of blood.  No other complaints or medication changes.  On exam, patient is hypoxic.  Patient  placed on nonrebreather but could not tolerate.  Patient was de-escalated to nasal cannula which she was able to then tolerate.  Patient's lungs were coarse bilaterally but no wheezing.  Chest and abdomen nontender.  Legs had some mild edema which she reports has worsened recently.  Patient thinks there may be fluid in her lungs again.  Patient had work-up which revealed symptomatic anemia with a hemoglobin of 4.6.  Patient will be given blood.  Suspect this is the  cause of her worsening symptoms.  Patient be admitted for further management.  The hospice team was called who agreed with admission as a do not feel she is safe to go back to her independent living facility at this time.  BNP was elevated.  Leukocytosis present.  Patient also has acute kidney injury.  Will need to monitor her kidney function for rehydration in the setting of some fluid overload.  Chest x-ray shows no pneumonia.  Urinalysis still awaiting collection.  Patient will be admitted for further management.     Final Clinical Impressions(s) / ED Diagnoses   Final diagnoses:  Symptomatic anemia  Syncope, unspecified syncope type  Shortness of breath  Hypoxia    ED Discharge Orders    None      Clinical Impression: 1. Symptomatic anemia   2. Syncope, unspecified syncope type   3. Shortness of breath   4. Hypoxia     Disposition: Admit  This note was prepared with assistance of Dragon voice recognition software. Occasional wrong-word or sound-a-like substitutions may have occurred due to the inherent limitations of voice recognition software.     Sami Froh, Gwenyth Allegra, MD 12/21/18 2000

## 2018-12-22 DIAGNOSIS — R0902 Hypoxemia: Secondary | ICD-10-CM

## 2018-12-22 DIAGNOSIS — E785 Hyperlipidemia, unspecified: Secondary | ICD-10-CM

## 2018-12-22 DIAGNOSIS — I10 Essential (primary) hypertension: Secondary | ICD-10-CM

## 2018-12-22 DIAGNOSIS — I5032 Chronic diastolic (congestive) heart failure: Secondary | ICD-10-CM

## 2018-12-22 LAB — CBC
HEMATOCRIT: 25.3 % — AB (ref 36.0–46.0)
Hemoglobin: 7.6 g/dL — ABNORMAL LOW (ref 12.0–15.0)
MCH: 23.7 pg — ABNORMAL LOW (ref 26.0–34.0)
MCHC: 30 g/dL (ref 30.0–36.0)
MCV: 78.8 fL — ABNORMAL LOW (ref 80.0–100.0)
Platelets: 211 10*3/uL (ref 150–400)
RBC: 3.21 MIL/uL — ABNORMAL LOW (ref 3.87–5.11)
RDW: 21.5 % — ABNORMAL HIGH (ref 11.5–15.5)
WBC: 16.9 10*3/uL — ABNORMAL HIGH (ref 4.0–10.5)
nRBC: 0 % (ref 0.0–0.2)

## 2018-12-22 LAB — TYPE AND SCREEN
ABO/RH(D): O POS
Antibody Screen: NEGATIVE
Unit division: 0
Unit division: 0

## 2018-12-22 LAB — BPAM RBC
Blood Product Expiration Date: 202003192359
Blood Product Expiration Date: 202003192359
ISSUE DATE / TIME: 202002192150
ISSUE DATE / TIME: 202002191642
UNIT TYPE AND RH: 5100
Unit Type and Rh: 5100

## 2018-12-22 MED ORDER — PANTOPRAZOLE SODIUM 40 MG PO TBEC: 40.0000 mg | DELAYED_RELEASE_TABLET | Freq: Every day | ORAL | 1 refills | Status: AC

## 2018-12-22 MED ORDER — COLLAGENASE 250 UNIT/GM EX OINT: TOPICAL_OINTMENT | Freq: Every day | CUTANEOUS | 0 refills | Status: DC

## 2018-12-22 MED ORDER — COLLAGENASE 250 UNIT/GM EX OINT
TOPICAL_OINTMENT | Freq: Every day | CUTANEOUS | Status: DC
  Filled 2018-12-22: qty 30

## 2018-12-22 MED ORDER — AMOXICILLIN-POT CLAVULANATE 500-125 MG PO TABS: 1.0000 | ORAL_TABLET | Freq: Two times a day (BID) | ORAL | Status: DC

## 2018-12-22 MED ORDER — AMOXICILLIN-POT CLAVULANATE 875-125 MG PO TABS
1.0000 | ORAL_TABLET | Freq: Two times a day (BID) | ORAL | Status: DC
  Administered 2018-12-22: 1 via ORAL
  Filled 2018-12-22: qty 1

## 2018-12-22 MED ORDER — AMOXICILLIN-POT CLAVULANATE 875-125 MG PO TABS: 1.0000 | ORAL_TABLET | Freq: Two times a day (BID) | ORAL | 0 refills | Status: DC

## 2018-12-22 MED ORDER — AMOXICILLIN-POT CLAVULANATE 500-125 MG PO TABS: 1.0000 | ORAL_TABLET | Freq: Two times a day (BID) | ORAL | 0 refills | Status: DC

## 2018-12-22 NOTE — Clinical Social Work Note (Signed)
Clinical Social Work Assessment  Patient Details  Name: [name] MRN: [id number] Date of Birth: 04-25-1928  Date of referral:  12/22/18               Reason for consult:  Discharge Planning                Permission sought to share information with:  Case Manager, Facility Sport and exercise psychologist, Family Supports Permission granted to share information::  Yes, Verbal Permission Granted  Name::     Therapist, sports::  SNFs  Relationship::  son  Contact Information:  [phone]  Housing/Transportation Living arrangements for the past 2 months:  Independent Living Facility(ILF [facility]) Source of Information:  Patient Patient Interpreter Needed:  None Criminal Activity/Legal Involvement Pertinent to Current Situation/Hospitalization:  No - Comment as needed Significant Relationships:  Adult Children Lives with:  Self Do you feel safe going back to the place where you live?  No Need for family participation in patient care:  Yes (Comment)  Care giving concerns:  CSW received referral for possible ALF placement at time of discharge. Spoke with patient regarding possibility of going to ALF vs ILF at [facility] upon discharge. Patient's family   is currently unable to care for her at their home given patient's current needs and fall risk.  Patient and   Son [name] at bedside expressed understanding of ALF recommendation at discharge. CSW to continue to follow and assist with discharge planning needs.     Social Worker assessment / plan:  Spoke with patient and son [name] at bedside    concerning possibility of going to ALF at [facility] before returning to [facility].    Employment status:  Retired Forensic scientist:  Other (Comment Required)([facility]) PT Recommendations:  (ALF vs ILF) Information / Referral to community resources:  Other (Comment Required)([facility])  Patient/Family's Response to care:  Patient and  Son [name] at bedside   recognize need for ALF before  returning to [place] [facility] . CSW explained insurance authorization process. Patient's family reported that they want patient to get stronger to be able to come back home.    Patient/Family's Understanding of and Emotional Response to Diagnosis, Current Treatment, and Prognosis:  Patient/family is realistic regarding therapy needs and expressed being hopeful for ALF placement. Patient expressed understanding of CSW role and discharge process as well as medical condition. No questions/concerns about plan or treatment.    Emotional Assessment Appearance:  Appears stated age Attitude/Demeanor/Rapport:  Unable to Assess Affect (typically observed):  Unable to Assess Orientation:  Oriented to Self, Oriented to Place, Oriented to  Time, Oriented to Situation Alcohol / Substance use:  Not Applicable Psych involvement (Current and /or in the community):  No (Comment)  Discharge Needs  Concerns to be addressed:  Discharge Planning Concerns Readmission within the last 30 days:  No Current discharge risk:  Dependent with Mobility Barriers to Discharge:  Continued Medical Work up   [employer], LCSW 12/22/2018, 10:39 AM

## 2018-12-22 NOTE — Progress Notes (Signed)
Patient will DC WH:[id number] Anticipated DC date:12/22/2018 Family notified:Tim (son) Transport by: [name] (son)  Per MD patient ready for DC to [facility] . RN, patient, patient's family, and facility notified of DC. Discharge Summary sent to facility along with signed FL2 and supplemental order form signed by physician per [facility] request. No number for nurse to call report per [facility] as patient will have private duty care upon arrival. [facility] confirms they have received all required documents. DC packet on chart.  Family will transport patient.  CSW signing off.   [place], [place]

## 2018-12-22 NOTE — Care Management (Signed)
[facility]AR scheduled to transport patient to [facility]. Bedside RN notified it will be next available ambulance.

## 2018-12-22 NOTE — Consult Note (Signed)
[facility] Nurse wound consult note Reason for Consult: sacrum and bilateral heels Wound type: sacral and bilateral buttocks appear to be deep tissue injury evolving into unstageable in patchy areas; left heel skin intact, right heel deep tissue injury  Pressure Injury POA: Yes Measurement: sacrum/buttocks 6x7 cm; 80% deep tissue injury 20% eschar, moderate amount of tan drainage, no odor or fluctuance  Right heel 1x0.5 cm 100% dark red deep tissue injury, no odor or drainage   Dressing procedure/placement/frequency: foam dressing to right heel to protect and promote healing; Float to reduce pressure. Santyl to sacrum and buttocks to provide enzymatic debridement. Discussed plan of care with primary nurse as patient is preparing to discharge today.   Shelton Silvas, RN, MSN  Please re-consult if further assistance is needed.  Thank-you,  Julien Girt MSN, [place], [place], [place], [place]

## 2018-12-22 NOTE — Progress Notes (Signed)
PHARMACY NOTE:  ANTIMICROBIAL RENAL DOSAGE ADJUSTMENT  Current antimicrobial regimen includes a mismatch between antimicrobial dosage and estimated renal function.  As per policy approved by the Pharmacy & Therapeutics and Medical Executive Committees, the antimicrobial dosage will be adjusted accordingly.  Current antimicrobial dosage:  Augmentin 875 po BID  Indication: Sacral ulcer with cellulitis  Renal Function:  CrCl cannot be calculated (Unknown ideal weight.). SCr 2.2, est CrCl ~32mL/min []      On intermittent HD, scheduled: []      On CRRT    Antimicrobial dosage has been changed to:  Augmentin 500 po BID  Additional comments:   Thank you for allowing pharmacy to be a part of this patient's care.  Brain Hilts, [facility] 12/22/2018 12:41 PM

## 2018-12-22 NOTE — Progress Notes (Signed)
[facility] GIP Admission  Pt is with [facility] with a diagnosis of heart failure.  She was at her ILF, where family reports decreasing functionality for several weeks, with several syncopal episodes.  EMS was activated for syncopal episode from her neurologist office.  She is admitted with symptomatic anemia.  Report received from RN, PT and family.  Visited with pt and family at the beside.  Pt alert and oriented, states she feels better than yesterday.  She states that she "agreed to be admitted last night for one night only and is ready to go home".  Family endorses that [facility] is not able to provide the care she needs, and they have a bed on the ALF side for her.  Pt is hesitantly agreeable to go the the ALF.  V/S: 98.3 oral, 113/43, HR 72, RR 22, SPO2 90% 2 lpm  Lab work:  WBCs 18.8>16.9, RBCs 2.19>3.21, hgb 4.6>7.6, HCT 17.1>25.3, Na 134, BUN 83, creat 2.22  I&O: 555 input, no output recorded  Discharge planning:  Wants to d/c today, spoke with MD and he is agreeable to d/c her back home.  Family to spend the night with her in ILF side, order DME and then transition her to ALF.  Goals of care:  Clear  IDT: updated  Family thought they could transport her home, however have decided they will need ambulance transportation.  RN Case Manager notified to pass on to LCSW.  Please use [facility] for return transport as they contract this service for [facility].  Thank you, Venia Carbon BSN, RN [facility] Liaison (listed in [place]) [phone]

## 2018-12-22 NOTE — NC FL2 (Signed)
[place] LEVEL OF CARE SCREENING TOOL     IDENTIFICATION  Patient Name: [name] Birthdate: 01-15-1928 Sex: female Admission Date (Current Location): 12/21/2018  [facility] and [place] Number:  Herbalist and Address:  [facility] [place], [place] [address] [place], [place] [address]      Provider Number: [id number]  Attending Physician Name and Address:  Caren Griffins, MD  Relative Name and Phone Number:  [name] (son) [phone]    Current Level of Care: Hospital Recommended Level of Care: [place] Prior Approval Number:    Date Approved/Denied: 05/12/10 PASRR Number: [id number]  Discharge Plan: Other (Comment)(ALF)    Current Diagnoses: Patient Active Problem List   Diagnosis Date Noted  . Anemia in other chronic diseases classified elsewhere 12/21/2018  . Severe anemia 12/21/2018  . Atypical atrial flutter ([place])   . CHF (congestive heart failure) ([place]) 04/06/2018  . Hyponatremia 04/06/2018  . Acute respiratory failure ([place]) 04/06/2018  . Normocytic normochromic anemia 04/06/2018  . RLS (restless legs syndrome) 08/11/2016  . Demand ischemia ([place]) 05/27/2016  . Acute on chronic diastolic congestive heart failure ([place]) 05/26/2016  . Atrial fibrillation with RVR ([place]) 05/26/2016  . Insomnia 01/22/2016  . Sinus bradycardia   . Chronic diastolic CHF (congestive heart failure) ([place])   . Essential hypertension   . PAF (paroxysmal atrial fibrillation) ([place])   . Stroke ([place])   . Memory deficit 05/24/2013  . Arthritis 10/13/2011  . Long term (current) use of anticoagulants 07/21/2011  . Dyslipidemia 07/10/2011    Orientation RESPIRATION BLADDER Height & Weight     Self, Time, Situation, Place  O2(nasal cannula 3L/min) Incontinent, External catheter Weight:   Height:     BEHAVIORAL SYMPTOMS/MOOD NEUROLOGICAL BOWEL NUTRITION STATUS      Continent Diet(regular per dc summary)  AMBULATORY STATUS COMMUNICATION  OF NEEDS Skin   Limited Assist Verbally Skin abrasions, PU Stage and Appropriate Care(abrasion right heel, ecchymosis right heel, skin tear left arm, pressure injury sacrum unstageable, pressure injury on left heel stage 1, unstageable pressure injury right heel)                       Personal Care Assistance Level of Assistance  Bathing, Feeding, Dressing, Total care Bathing Assistance: Limited assistance Feeding assistance: Independent Dressing Assistance: Limited assistance Total Care Assistance: Limited assistance   Functional Limitations Info  Hearing, Speech, Sight Sight Info: Adequate Hearing Info: Adequate Speech Info: Adequate    SPECIAL CARE FACTORS FREQUENCY  PT (By licensed PT), OT (By licensed OT)     PT Frequency: min 5x weekly OT Frequency: min 5x weekly            Contractures Contractures Info: Not present    Additional Factors Info  Code Status, Allergies Code Status Info: DNR Allergies Info: Codeine           Current Medications (12/22/2018):  This is the current hospital active medication list Current Facility-Administered Medications  Medication Dose Route Frequency Provider Last Rate Last Dose  . 0.9 %  sodium chloride infusion (Manually program via Guardrails IV Fluids)   Intravenous Once Barb Merino, MD   Stopped at 12/21/18 2001  . amoxicillin-clavulanate (AUGMENTIN) 875-125 MG per tablet 1 tablet  1 tablet Oral Q12H Caren Griffins, MD   1 tablet at 12/22/18 1000  . diclofenac sodium (VOLTAREN) 1 % transdermal gel 2 g  2 g Topical QID PRN Gardiner Barefoot,  NP      . ondansetron (ZOFRAN) tablet 4 mg  4 mg Oral Q6H PRN Barb Merino, MD       Or  . ondansetron (ZOFRAN) injection 4 mg  4 mg Intravenous Q6H PRN Barb Merino, MD         Discharge Medications: TAKE these medications       amiodarone 200 MG tablet Commonly known as:  PACERONE Take 1 tablet (200 mg total) by mouth daily.   amoxicillin-clavulanate 875-125 MG  tablet Commonly known as:  AUGMENTIN Take 1 tablet by mouth every 12 (twelve) hours.   diclofenac sodium 1 % Gel Commonly known as:  VOLTAREN Apply 4 g topically as needed (shoulder pain, severe pain).   Eszopiclone 3 MG Tabs Take 3 mg by mouth at bedtime. Take immediately before bedtime   fentaNYL 50 MCG/HR Commonly known as:  [place] 1 patch onto the skin every 3 (three) days.   furosemide 40 MG tablet Commonly known as:  LASIX Take 1 tablet (40 mg total) by mouth every other day.   hydrOXYzine 10 MG tablet Commonly known as:  ATARAX/VISTARIL Take 1 tablet (10 mg total) by mouth 3 (three) times daily as needed for anxiety.   isosorbide mononitrate 30 MG 24 hr tablet Commonly known as:  IMDUR Take 1 tablet (30 mg total) by mouth daily.   LORazepam 0.5 MG tablet Commonly known as:  ATIVAN Take 0.5 mg by mouth every 8 (eight) hours as needed for anxiety.   Melatonin 3 MG Tabs Take 3 tablets (9 mg total) by mouth at bedtime as needed (for sleep).   metoprolol succinate 25 MG 24 hr tablet Commonly known as:  TOPROL-XL Take 0.5 tablets (12.5 mg total) by mouth daily.   nitroGLYCERIN 0.4 MG SL tablet Commonly known as:  NITROSTAT DISSOLVE 1 TABLET UNDER THE TONGUE EVERY 5 MINUTES AS NEEDED FOR CHEST PAIN What changed:  See the new instructions.   pantoprazole 40 MG tablet Commonly known as:  PROTONIX Take 1 tablet (40 mg total) by mouth daily.   RESTASIS 0.05 % ophthalmic emulsion Generic drug:  cycloSPORINE Place 1 drop into both eyes 2 (two) times daily.   rOPINIRole 0.25 MG tablet Commonly known as:  REQUIP Take 0.25 mg by mouth at bedtime.   traMADol 50 MG tablet Commonly known as:  ULTRAM Take 2 tablets (100 mg total) by mouth every 6 (six) hours as needed for severe pain. What changed:  when to take this   STOP taking these medications       ibuprofen 600 MG tablet Commonly known as:  ADVIL,MOTRIN   lisinopril 5 MG  tablet Commonly known as:  PRINIVIL,ZESTRIL   Rivaroxaban 15 MG Tabs tablet Commonly known as:  XARELTO   rivastigmine 4.5 MG capsule Commonly known as:  EXELON        Discharge Instructions    For home use only DME oxygen   Complete by:  As directed    Mode or (Route):  Nasal cannula   Liters per Minute:  2   Frequency:  Continuous (stationary and portable oxygen unit needed)   Oxygen delivery system:  Gas       Relevant Imaging Results:  Relevant Lab Results:   Additional Information SSN: [id number]  Alberteen Sam, LCSW

## 2018-12-22 NOTE — Progress Notes (Signed)
Pt and family given discharge instructions and gone over with them. Second copy printed out for facility. DNR on chart. Pt being transported back to facility via [facility].

## 2018-12-22 NOTE — Progress Notes (Signed)
Physical Therapy Evaluation Patient Details Name: [name] MRN: [id number] DOB: 1928-08-26 Today's Date: 12/22/2018   History of Present Illness  Patient is 85 y/o female presenting to hospital following syncope episode with nausea and vomiting secondary to anemia. Patient received blood transfusion. PMH includes dCHF, HTN, PAF, stroke, osteoporosis, memory deficits, and failure to thrive   Clinical Impression  Patient admitted to hospital secondary to problems above and with deficits below. Patient required mod-maxA to transfer to [facility] and chair. Patient was very anxious throughout session, and adamant about returning to [place]. Feel patient would benefit from SNF level therapy due to functional mobility and cognitive deficits, however family reports returning to ILF with 24/7 assist to then transition to ALF with hospice services.  Patient will benefit from acute physical therapy to maximize independence and safety with functional mobility.     Follow Up Recommendations Supervision/Assistance - 24 hour;Other (comment)(Refusing SNF. Family reports returning to [place] with 24/7)    Equipment Recommendations  Other (comment)(TBD at next venue)    Recommendations for Other Services       Precautions / Restrictions Precautions Precautions: Fall Restrictions Weight Bearing Restrictions: No      Mobility  Bed Mobility Overal bed mobility: Needs Assistance Bed Mobility: Supine to Sit     Supine to sit: Mod assist     General bed mobility comments: Patient required modA for lift assit and trunk control to sit EOB  Transfers Overall transfer level: Needs assistance Equipment used: Rolling walker (2 wheeled);None Transfers: Sit to/from [facility] Sit to Stand: Mod assist Stand pivot transfers: Mod assist;Max assist       General transfer comment: Patient required modA for steadying and lift assist with use of RW. Verbal cues to not push up using RW however patient  ignored cues and therapist braced RW. Required mod-max A x2 for steadying to transfer to [facility] and chair. Used RW during first transfer and patient with unsafe technique despite verbal cues for sequencing. Completed second transfer without use of RW and with therapist standing in front of patient. Patient with significant posterior lean in standing.   Ambulation/Gait                Stairs            Wheelchair Mobility    Modified Rankin (Stroke Patients Only)       Balance Overall balance assessment: Needs assistance Sitting-balance support: No upper extremity supported;Feet unsupported Sitting balance-Leahy Scale: Poor Sitting balance - Comments: Patient with unsteadiness in sitting    Standing balance support: Bilateral upper extremity supported Standing balance-Leahy Scale: Poor Standing balance comment: Patient required external support to maintain standing balance                             Pertinent Vitals/Pain Pain Assessment: Faces Faces Pain Scale: No hurt    Home Living Family/patient expects to be discharged to:: Other (Comment)                 Additional Comments: From ILF. Family plans to take patient back to ILF with 24 hour support for 1-2 days and then transition to ALF. Patient reports 3-4 steps with bilateral hand rails to get into house.     Prior Function Level of Independence: Needs assistance   Gait / Transfers Assistance Needed: uses a RW for mobility. RN reports needed assist with walking. Son states patient is mostly immobile.  Hand Dominance        Extremity/Trunk Assessment   Upper Extremity Assessment Upper Extremity Assessment: Generalized weakness    Lower Extremity Assessment Lower Extremity Assessment: Generalized weakness    Cervical / Trunk Assessment Cervical / Trunk Assessment: Normal  Communication   Communication: No difficulties  Cognition Arousal/Alertness:  Awake/alert Behavior During Therapy: Anxious Overall Cognitive Status: History of cognitive impairments - at baseline                                 General Comments: Patient very anxious during session. Patient with history of memory deficits at baseline. Patient very adament about using BSC even though she had a purewick. Frequently stating she wants to leave hospital because she has hair appointment today.       General Comments General comments (skin integrity, edema, etc.): Patient son and daughter in law in room during session. States can provide 24-7 assist for 1-2 days and then transition to ALF.     Exercises     Assessment/Plan    PT Assessment Patient needs continued PT services  PT Problem List Decreased strength;Decreased range of motion;Decreased activity tolerance;Decreased balance;Decreased mobility;Decreased cognition;Decreased knowledge of use of DME;Decreased safety awareness;Decreased knowledge of precautions       PT Treatment Interventions DME instruction;Gait training;Functional mobility training;Therapeutic exercise;Therapeutic activities;Balance training;Cognitive remediation;Patient/family education    PT Goals (Current goals can be found in the Care Plan section)  Acute Rehab PT Goals Patient Stated Goal: go home PT Goal Formulation: With patient Time For Goal Achievement: 01/05/19 Potential to Achieve Goals: Fair    Frequency Min 2X/week   Barriers to discharge        Co-evaluation               AM-PAC PT "6 Clicks" Mobility  Outcome Measure Help needed turning from your back to your side while in a flat bed without using bedrails?: A Little Help needed moving from lying on your back to sitting on the side of a flat bed without using bedrails?: A Lot Help needed moving to and from a bed to a chair (including a wheelchair)?: A Lot Help needed standing up from a chair using your arms (e.g., wheelchair or bedside chair)?: A  Lot Help needed to walk in hospital room?: A Lot Help needed climbing 3-5 steps with a railing? : Total 6 Click Score: 12    End of Session Equipment Utilized During Treatment: Gait belt;Oxygen(3L) Activity Tolerance: Patient tolerated treatment well Patient left: in chair;with call bell/phone within reach;with chair alarm set;with family/visitor present Nurse Communication: Mobility status PT Visit Diagnosis: Unsteadiness on feet (R26.81);Muscle weakness (generalized) (M62.81);Difficulty in walking, not elsewhere classified (R26.2)    Time: 3212-2482 PT Time Calculation (min) (ACUTE ONLY): 36 min   Charges:   PT Evaluation $PT Eval Moderate Complexity: 1 Mod PT Treatments $Therapeutic Activity: 8-22 mins        Erick Blinks, SPT  Erick Blinks 12/22/2018, 10:42 AM

## 2018-12-22 NOTE — Discharge Summary (Addendum)
Physician Discharge Summary  [name] FHL:[id number] DOB: [birth year] DOA: 12/21/2018  PCP: Lajean Manes, MD  Admit date: 12/21/2018 Discharge date: 12/22/2018  Admitted From: ILF Disposition:  ALF with hospice  Recommendations for Outpatient Follow-up:  1. Follow up with PCP in 1-2 weeks 2. Please obtain BMP/CBC in one week  Home Health: none Equipment/Devices: none  Discharge Condition: stable CODE STATUS: DNR Diet recommendation: regular  HPI: Per admitting MD, [name] is a 85 y.o. female with medical history significant of extensive medical problems including paroxysmal A. fib, chronic diastolic heart failure, chronic insomnia, hypertension, failure to thrive and currently on hospice care under [place] [facility] at independent living facility.  Patient is poor historian.  She says that she really does not want to stay in the hospital.  She says that she has been having episodes where she passes out and is having difficulty getting around.  She went to neurology office and while she was there was found to have low blood pressure and patient was significant nausea vomiting so was sent to the ER. Patient herself is poor historian.  She is really upset about needing for hospital visit.  She denies any hematemesis, hemoptysis, hematochezia or melena.  She denies any urinary changes.  She states he goes more frequent urination because she takes Lasix.  She does not want any procedures, endoscopies or stool testing. ED Course: Blood pressures are stable in the ER.  Patient is on 2 L oxygen.  Hemoglobin is 4.6.  WBC is 18.8, however with no evidence of infection.  Creatinine is 2.2.  Patient was advised 2 units of PRBC transfusion by emergency room physician and she consented for it. I discussed case with hospice of [place], patient currently at independen living and they are trying to get her to assisted living side with hospice.  Since patient does not have enough support at independent  living, until arrangements are made, they requested patient to be in the hospital for monitoring and transfusion.  Hospital Course: Symptomatic anemia: With episodic syncopal episode.  Unknown source of bleeding.  Patient probably has GI bleeding, she declined to have FOBT tested or to have evaluation for endoscopy. This was discussed with family who agree to avoid invasive interventions. She received blood transfusions with goo dimprovement in her hemoglobin. She will be placed on PPI, avoid NSAIDs and stop Xarelto.  Paroxysmal atrial fibrillation: Rate controlled.  Continue amiodarone and metoprolol.  Discontinue Xarelto.  She will not benefit long-term with continuing Xarelto on symptomatic anemia. Hypertension: Blood pressures are stable. Hold lisinopril Multiple comorbidities, failure to thrive, cognitive decline: Patient with hospice care.  She wants to continue hospice care.  She does not want any other investigations.  She does not want hospitalizations.  She clearly stated she is DNR. AKI on CKD 3 - likely in the setting of anemia. Discontinue Lisinopril. To continue hospice care. Pressure ulcer, sacrum, unstageable, present on admission - mild surrounding cellulitis, placed on Augmentin for few days.   Discharge Diagnoses:  Principal Problem:   Anemia in other chronic diseases classified elsewhere Active Problems:   Dyslipidemia   Chronic diastolic CHF (congestive heart failure) (HCC)   Essential hypertension   PAF (paroxysmal atrial fibrillation) (HCC)   Stroke [facility])   Severe anemia     Discharge Instructions  Discharge Instructions    For home use only DME oxygen   Complete by:  As directed    Mode or (Route):  Nasal cannula   Liters  per Minute:  2   Frequency:  Continuous (stationary and portable oxygen unit needed)   Oxygen delivery system:  Gas     Allergies as of 12/22/2018      Reactions   Codeine Nausea Only      Medication List    STOP taking these  medications   ibuprofen 600 MG tablet Commonly known as:  ADVIL,MOTRIN   lisinopril 5 MG tablet Commonly known as:  PRINIVIL,ZESTRIL   Rivaroxaban 15 MG Tabs tablet Commonly known as:  XARELTO   rivastigmine 4.5 MG capsule Commonly known as:  EXELON     TAKE these medications   amiodarone 200 MG tablet Commonly known as:  PACERONE Take 1 tablet (200 mg total) by mouth daily.   amoxicillin-clavulanate 500-125 MG tablet Commonly known as:  AUGMENTIN Take 1 tablet (500 mg total) by mouth 2 (two) times daily.   collagenase ointment Commonly known as:  SANTYL Apply topically daily.   diclofenac sodium 1 % Gel Commonly known as:  VOLTAREN Apply 4 g topically as needed (shoulder pain, severe pain).   Eszopiclone 3 MG Tabs Take 3 mg by mouth at bedtime. Take immediately before bedtime   fentaNYL 50 MCG/HR Commonly known as:  [place] 1 patch onto the skin every 3 (three) days.   furosemide 40 MG tablet Commonly known as:  LASIX Take 1 tablet (40 mg total) by mouth every other day.   hydrOXYzine 10 MG tablet Commonly known as:  ATARAX/VISTARIL Take 1 tablet (10 mg total) by mouth 3 (three) times daily as needed for anxiety.   isosorbide mononitrate 30 MG 24 hr tablet Commonly known as:  IMDUR Take 1 tablet (30 mg total) by mouth daily.   LORazepam 0.5 MG tablet Commonly known as:  ATIVAN Take 0.5 mg by mouth every 8 (eight) hours as needed for anxiety.   Melatonin 3 MG Tabs Take 3 tablets (9 mg total) by mouth at bedtime as needed (for sleep).   metoprolol succinate 25 MG 24 hr tablet Commonly known as:  TOPROL-XL Take 0.5 tablets (12.5 mg total) by mouth daily.   nitroGLYCERIN 0.4 MG SL tablet Commonly known as:  NITROSTAT DISSOLVE 1 TABLET UNDER THE TONGUE EVERY 5 MINUTES AS NEEDED FOR CHEST PAIN What changed:  See the new instructions.   pantoprazole 40 MG tablet Commonly known as:  PROTONIX Take 1 tablet (40 mg total) by mouth daily.   RESTASIS  0.05 % ophthalmic emulsion Generic drug:  cycloSPORINE Place 1 drop into both eyes 2 (two) times daily.   rOPINIRole 0.25 MG tablet Commonly known as:  REQUIP Take 0.25 mg by mouth at bedtime.   traMADol 50 MG tablet Commonly known as:  ULTRAM Take 2 tablets (100 mg total) by mouth every 6 (six) hours as needed for severe pain. What changed:  when to take this            Durable Medical Equipment  (From admission, onward)         Start     Ordered   12/22/18 0000  For home use only DME oxygen    Question Answer Comment  Mode or (Route) Nasal cannula   Liters per Minute 2   Frequency Continuous (stationary and portable oxygen unit needed)   Oxygen delivery system Gas      12/22/18 1020           Consultations:  None   Procedures/Studies:  Dg Chest 2 View  Result Date: 12/21/2018 CLINICAL DATA:  pt coming from [facility] for follow up on medication reaction. Patient having nausea and vomiting for 2 hours. Patient also had a syncopal episode. EXAM: CHEST - 2 VIEW COMPARISON:  04/06/2018 FINDINGS: Stable linear opacities in the lung bases left greater than right. No new infiltrate or overt edema. Heart size upper limits normal. Aortic Atherosclerosis (ICD10-170.0). No effusion. No pneumothorax. Right shoulder arthroplasty hardware. Advanced DJD in the left shoulder. Stable midthoracic compression deformities. IMPRESSION: No acute cardiopulmonary disease. Electronically Signed   By: Lucrezia Europe M.D.   On: 12/21/2018 15:04    Subjective: - no chest pain, shortness of breath, no abdominal pain, nausea or vomiting.   Discharge Exam: Vitals:   12/21/18 2219 12/22/18 0113  BP: (!) 113/43 125/67  Pulse: 72 (!) 143  Resp: (!) 22 20  Temp: 98.3 F (36.8 C) 98.8 F (37.1 C)  SpO2: 90% 100%    General: Pt is alert, awake, not in acute distress Cardiovascular: RRR, S1/S2 +, no rubs, no gallops Respiratory: CTA bilaterally, no wheezing, no  rhonchi Abdominal: Soft, NT, ND, bowel sounds + Extremities: no edema, no cyanosis   The results of significant diagnostics from this hospitalization (including imaging, microbiology, ancillary and laboratory) are listed below for reference.     Microbiology: No results found for this or any previous visit (from the past 240 hour(s)).   Labs: BNP (last 3 results) Recent Labs    04/06/18 0240 12/21/18 1344  BNP 388.3* 213.0*   Basic Metabolic Panel: Recent Labs  Lab 12/21/18 1342  NA 134*  K 5.1  CL 100  CO2 23  GLUCOSE 110*  BUN 83*  CREATININE 2.22*  CALCIUM 8.2*   Liver Function Tests: Recent Labs  Lab 12/21/18 1342  AST 39  ALT 41  ALKPHOS 62  BILITOT 0.6  PROT 5.2*  ALBUMIN 2.5*   Recent Labs  Lab 12/21/18 1342  LIPASE 27   No results for input(s): AMMONIA in the last 168 hours. CBC: Recent Labs  Lab 12/21/18 1342 12/22/18 0447  WBC 18.8* 16.9*  NEUTROABS 17.4*  --   HGB 4.6* 7.6*  HCT 17.1* 25.3*  MCV 78.1* 78.8*  PLT 257 211   Cardiac Enzymes: No results for input(s): CKTOTAL, CKMB, CKMBINDEX, TROPONINI in the last 168 hours. BNP: Invalid input(s): POCBNP CBG: No results for input(s): GLUCAP in the last 168 hours. D-Dimer No results for input(s): DDIMER in the last 72 hours. Hgb A1c No results for input(s): HGBA1C in the last 72 hours. Lipid Profile No results for input(s): CHOL, HDL, LDLCALC, TRIG, CHOLHDL, LDLDIRECT in the last 72 hours. Thyroid function studies No results for input(s): TSH, T4TOTAL, T3FREE, THYROIDAB in the last 72 hours.  Invalid input(s): FREET3 Anemia work up No results for input(s): VITAMINB12, FOLATE, FERRITIN, TIBC, IRON, RETICCTPCT in the last 72 hours. Urinalysis    Component Value Date/Time   COLORURINE YELLOW 04/06/2018 2249   APPEARANCEUR CLEAR 04/06/2018 2249   LABSPEC 1.013 04/06/2018 2249   PHURINE 5.0 04/06/2018 2249   GLUCOSEU NEGATIVE 04/06/2018 2249   HGBUR NEGATIVE 04/06/2018 2249    BILIRUBINUR NEGATIVE 04/06/2018 2249   KETONESUR NEGATIVE 04/06/2018 2249   PROTEINUR NEGATIVE 04/06/2018 2249   UROBILINOGEN 0.2 08/16/2011 0227   NITRITE NEGATIVE 04/06/2018 2249   LEUKOCYTESUR NEGATIVE 04/06/2018 2249   Sepsis Labs Invalid input(s): PROCALCITONIN,  WBC,  LACTICIDVEN   Time coordinating discharge: 45 minutes  SIGNED:  Marzetta Board, MD  [facility] 12/22/2018, 12:56 PM

## 2018-12-23 DIAGNOSIS — F0390 Unspecified dementia without behavioral disturbance: Secondary | ICD-10-CM | POA: Diagnosis not present

## 2018-12-23 DIAGNOSIS — L89610 Pressure ulcer of right heel, unstageable: Secondary | ICD-10-CM | POA: Diagnosis not present

## 2018-12-23 DIAGNOSIS — S32000S Wedge compression fracture of unspecified lumbar vertebra, sequela: Secondary | ICD-10-CM | POA: Diagnosis not present

## 2018-12-23 DIAGNOSIS — M069 Rheumatoid arthritis, unspecified: Secondary | ICD-10-CM | POA: Diagnosis not present

## 2018-12-23 DIAGNOSIS — I5040 Unspecified combined systolic (congestive) and diastolic (congestive) heart failure: Secondary | ICD-10-CM | POA: Diagnosis not present

## 2018-12-23 DIAGNOSIS — I4891 Unspecified atrial fibrillation: Secondary | ICD-10-CM | POA: Diagnosis not present

## 2018-12-26 DIAGNOSIS — M069 Rheumatoid arthritis, unspecified: Secondary | ICD-10-CM | POA: Diagnosis not present

## 2018-12-26 DIAGNOSIS — I5040 Unspecified combined systolic (congestive) and diastolic (congestive) heart failure: Secondary | ICD-10-CM | POA: Diagnosis not present

## 2018-12-26 DIAGNOSIS — L89610 Pressure ulcer of right heel, unstageable: Secondary | ICD-10-CM | POA: Diagnosis not present

## 2018-12-26 DIAGNOSIS — I4891 Unspecified atrial fibrillation: Secondary | ICD-10-CM | POA: Diagnosis not present

## 2018-12-26 DIAGNOSIS — S32000S Wedge compression fracture of unspecified lumbar vertebra, sequela: Secondary | ICD-10-CM | POA: Diagnosis not present

## 2018-12-26 DIAGNOSIS — F0390 Unspecified dementia without behavioral disturbance: Secondary | ICD-10-CM | POA: Diagnosis not present

## 2018-12-27 DIAGNOSIS — S32000S Wedge compression fracture of unspecified lumbar vertebra, sequela: Secondary | ICD-10-CM | POA: Diagnosis not present

## 2018-12-27 DIAGNOSIS — L89610 Pressure ulcer of right heel, unstageable: Secondary | ICD-10-CM | POA: Diagnosis not present

## 2018-12-27 DIAGNOSIS — I4891 Unspecified atrial fibrillation: Secondary | ICD-10-CM | POA: Diagnosis not present

## 2018-12-27 DIAGNOSIS — F0390 Unspecified dementia without behavioral disturbance: Secondary | ICD-10-CM | POA: Diagnosis not present

## 2018-12-27 DIAGNOSIS — I5040 Unspecified combined systolic (congestive) and diastolic (congestive) heart failure: Secondary | ICD-10-CM | POA: Diagnosis not present

## 2018-12-27 DIAGNOSIS — M069 Rheumatoid arthritis, unspecified: Secondary | ICD-10-CM | POA: Diagnosis not present

## 2018-12-28 DIAGNOSIS — I5040 Unspecified combined systolic (congestive) and diastolic (congestive) heart failure: Secondary | ICD-10-CM | POA: Diagnosis not present

## 2018-12-28 DIAGNOSIS — L89610 Pressure ulcer of right heel, unstageable: Secondary | ICD-10-CM | POA: Diagnosis not present

## 2018-12-28 DIAGNOSIS — I4891 Unspecified atrial fibrillation: Secondary | ICD-10-CM | POA: Diagnosis not present

## 2018-12-28 DIAGNOSIS — F0390 Unspecified dementia without behavioral disturbance: Secondary | ICD-10-CM | POA: Diagnosis not present

## 2018-12-28 DIAGNOSIS — S32000S Wedge compression fracture of unspecified lumbar vertebra, sequela: Secondary | ICD-10-CM | POA: Diagnosis not present

## 2018-12-28 DIAGNOSIS — M069 Rheumatoid arthritis, unspecified: Secondary | ICD-10-CM | POA: Diagnosis not present

## 2018-12-30 DIAGNOSIS — I4891 Unspecified atrial fibrillation: Secondary | ICD-10-CM | POA: Diagnosis not present

## 2018-12-30 DIAGNOSIS — L89610 Pressure ulcer of right heel, unstageable: Secondary | ICD-10-CM | POA: Diagnosis not present

## 2018-12-30 DIAGNOSIS — S32000S Wedge compression fracture of unspecified lumbar vertebra, sequela: Secondary | ICD-10-CM | POA: Diagnosis not present

## 2018-12-30 DIAGNOSIS — F0390 Unspecified dementia without behavioral disturbance: Secondary | ICD-10-CM | POA: Diagnosis not present

## 2018-12-30 DIAGNOSIS — I5040 Unspecified combined systolic (congestive) and diastolic (congestive) heart failure: Secondary | ICD-10-CM | POA: Diagnosis not present

## 2018-12-30 DIAGNOSIS — M069 Rheumatoid arthritis, unspecified: Secondary | ICD-10-CM | POA: Diagnosis not present

## 2018-12-31 DIAGNOSIS — I5040 Unspecified combined systolic (congestive) and diastolic (congestive) heart failure: Secondary | ICD-10-CM | POA: Diagnosis not present

## 2018-12-31 DIAGNOSIS — I4891 Unspecified atrial fibrillation: Secondary | ICD-10-CM | POA: Diagnosis not present

## 2018-12-31 DIAGNOSIS — S32000S Wedge compression fracture of unspecified lumbar vertebra, sequela: Secondary | ICD-10-CM | POA: Diagnosis not present

## 2018-12-31 DIAGNOSIS — F0390 Unspecified dementia without behavioral disturbance: Secondary | ICD-10-CM | POA: Diagnosis not present

## 2018-12-31 DIAGNOSIS — M069 Rheumatoid arthritis, unspecified: Secondary | ICD-10-CM | POA: Diagnosis not present

## 2018-12-31 DIAGNOSIS — L89610 Pressure ulcer of right heel, unstageable: Secondary | ICD-10-CM | POA: Diagnosis not present

## 2019-01-01 DIAGNOSIS — F0390 Unspecified dementia without behavioral disturbance: Secondary | ICD-10-CM | POA: Diagnosis not present

## 2019-01-01 DIAGNOSIS — L89610 Pressure ulcer of right heel, unstageable: Secondary | ICD-10-CM | POA: Diagnosis not present

## 2019-01-01 DIAGNOSIS — G459 Transient cerebral ischemic attack, unspecified: Secondary | ICD-10-CM | POA: Diagnosis not present

## 2019-01-01 DIAGNOSIS — I5040 Unspecified combined systolic (congestive) and diastolic (congestive) heart failure: Secondary | ICD-10-CM | POA: Diagnosis not present

## 2019-01-01 DIAGNOSIS — H3530 Unspecified macular degeneration: Secondary | ICD-10-CM | POA: Diagnosis not present

## 2019-01-01 DIAGNOSIS — G2581 Restless legs syndrome: Secondary | ICD-10-CM | POA: Diagnosis not present

## 2019-01-01 DIAGNOSIS — M810 Age-related osteoporosis without current pathological fracture: Secondary | ICD-10-CM | POA: Diagnosis not present

## 2019-01-01 DIAGNOSIS — G894 Chronic pain syndrome: Secondary | ICD-10-CM | POA: Diagnosis not present

## 2019-01-01 DIAGNOSIS — M069 Rheumatoid arthritis, unspecified: Secondary | ICD-10-CM | POA: Diagnosis not present

## 2019-01-01 DIAGNOSIS — I4891 Unspecified atrial fibrillation: Secondary | ICD-10-CM | POA: Diagnosis not present

## 2019-01-01 DIAGNOSIS — S32000S Wedge compression fracture of unspecified lumbar vertebra, sequela: Secondary | ICD-10-CM | POA: Diagnosis not present

## 2019-01-02 DIAGNOSIS — S32000S Wedge compression fracture of unspecified lumbar vertebra, sequela: Secondary | ICD-10-CM | POA: Diagnosis not present

## 2019-01-02 DIAGNOSIS — I4891 Unspecified atrial fibrillation: Secondary | ICD-10-CM | POA: Diagnosis not present

## 2019-01-02 DIAGNOSIS — F0390 Unspecified dementia without behavioral disturbance: Secondary | ICD-10-CM | POA: Diagnosis not present

## 2019-01-02 DIAGNOSIS — M069 Rheumatoid arthritis, unspecified: Secondary | ICD-10-CM | POA: Diagnosis not present

## 2019-01-02 DIAGNOSIS — L89610 Pressure ulcer of right heel, unstageable: Secondary | ICD-10-CM | POA: Diagnosis not present

## 2019-01-02 DIAGNOSIS — I5040 Unspecified combined systolic (congestive) and diastolic (congestive) heart failure: Secondary | ICD-10-CM | POA: Diagnosis not present

## 2019-01-03 DIAGNOSIS — I5040 Unspecified combined systolic (congestive) and diastolic (congestive) heart failure: Secondary | ICD-10-CM | POA: Diagnosis not present

## 2019-01-03 DIAGNOSIS — L89610 Pressure ulcer of right heel, unstageable: Secondary | ICD-10-CM | POA: Diagnosis not present

## 2019-01-03 DIAGNOSIS — S32000S Wedge compression fracture of unspecified lumbar vertebra, sequela: Secondary | ICD-10-CM | POA: Diagnosis not present

## 2019-01-03 DIAGNOSIS — I4891 Unspecified atrial fibrillation: Secondary | ICD-10-CM | POA: Diagnosis not present

## 2019-01-03 DIAGNOSIS — F0390 Unspecified dementia without behavioral disturbance: Secondary | ICD-10-CM | POA: Diagnosis not present

## 2019-01-03 DIAGNOSIS — M069 Rheumatoid arthritis, unspecified: Secondary | ICD-10-CM | POA: Diagnosis not present

## 2019-01-04 DIAGNOSIS — F0390 Unspecified dementia without behavioral disturbance: Secondary | ICD-10-CM | POA: Diagnosis not present

## 2019-01-04 DIAGNOSIS — M069 Rheumatoid arthritis, unspecified: Secondary | ICD-10-CM | POA: Diagnosis not present

## 2019-01-04 DIAGNOSIS — I4891 Unspecified atrial fibrillation: Secondary | ICD-10-CM | POA: Diagnosis not present

## 2019-01-04 DIAGNOSIS — S32000S Wedge compression fracture of unspecified lumbar vertebra, sequela: Secondary | ICD-10-CM | POA: Diagnosis not present

## 2019-01-04 DIAGNOSIS — L89610 Pressure ulcer of right heel, unstageable: Secondary | ICD-10-CM | POA: Diagnosis not present

## 2019-01-04 DIAGNOSIS — I5040 Unspecified combined systolic (congestive) and diastolic (congestive) heart failure: Secondary | ICD-10-CM | POA: Diagnosis not present

## 2019-01-05 DIAGNOSIS — F0390 Unspecified dementia without behavioral disturbance: Secondary | ICD-10-CM | POA: Diagnosis not present

## 2019-01-05 DIAGNOSIS — I5040 Unspecified combined systolic (congestive) and diastolic (congestive) heart failure: Secondary | ICD-10-CM | POA: Diagnosis not present

## 2019-01-05 DIAGNOSIS — L89610 Pressure ulcer of right heel, unstageable: Secondary | ICD-10-CM | POA: Diagnosis not present

## 2019-01-05 DIAGNOSIS — I4891 Unspecified atrial fibrillation: Secondary | ICD-10-CM | POA: Diagnosis not present

## 2019-01-05 DIAGNOSIS — M069 Rheumatoid arthritis, unspecified: Secondary | ICD-10-CM | POA: Diagnosis not present

## 2019-01-05 DIAGNOSIS — S32000S Wedge compression fracture of unspecified lumbar vertebra, sequela: Secondary | ICD-10-CM | POA: Diagnosis not present

## 2019-01-06 DIAGNOSIS — I5040 Unspecified combined systolic (congestive) and diastolic (congestive) heart failure: Secondary | ICD-10-CM | POA: Diagnosis not present

## 2019-01-06 DIAGNOSIS — F0390 Unspecified dementia without behavioral disturbance: Secondary | ICD-10-CM | POA: Diagnosis not present

## 2019-01-06 DIAGNOSIS — S32000S Wedge compression fracture of unspecified lumbar vertebra, sequela: Secondary | ICD-10-CM | POA: Diagnosis not present

## 2019-01-06 DIAGNOSIS — I4891 Unspecified atrial fibrillation: Secondary | ICD-10-CM | POA: Diagnosis not present

## 2019-01-06 DIAGNOSIS — L89610 Pressure ulcer of right heel, unstageable: Secondary | ICD-10-CM | POA: Diagnosis not present

## 2019-01-06 DIAGNOSIS — M069 Rheumatoid arthritis, unspecified: Secondary | ICD-10-CM | POA: Diagnosis not present

## 2019-01-07 ENCOUNTER — Encounter (HOSPITAL_COMMUNITY): Payer: Self-pay | Admitting: Emergency Medicine

## 2019-01-07 ENCOUNTER — Emergency Department (HOSPITAL_COMMUNITY)
Admission: EM | Admit: 2019-01-07 | Discharge: 2019-01-08 | Disposition: A | Discharge status: Skilled Nursing Facility | Attending: Emergency Medicine | Admitting: Emergency Medicine

## 2019-01-07 ENCOUNTER — Other Ambulatory Visit: Payer: Self-pay

## 2019-01-07 DIAGNOSIS — W1812XA Fall from or off toilet with subsequent striking against object, initial encounter: Secondary | ICD-10-CM | POA: Diagnosis not present

## 2019-01-07 DIAGNOSIS — I5032 Chronic diastolic (congestive) heart failure: Secondary | ICD-10-CM | POA: Insufficient documentation

## 2019-01-07 DIAGNOSIS — M5489 Other dorsalgia: Secondary | ICD-10-CM | POA: Diagnosis not present

## 2019-01-07 DIAGNOSIS — Y999 Unspecified external cause status: Secondary | ICD-10-CM | POA: Insufficient documentation

## 2019-01-07 DIAGNOSIS — Z79899 Other long term (current) drug therapy: Secondary | ICD-10-CM | POA: Diagnosis not present

## 2019-01-07 DIAGNOSIS — L89610 Pressure ulcer of right heel, unstageable: Secondary | ICD-10-CM | POA: Diagnosis not present

## 2019-01-07 DIAGNOSIS — S0990XA Unspecified injury of head, initial encounter: Secondary | ICD-10-CM | POA: Diagnosis not present

## 2019-01-07 DIAGNOSIS — M069 Rheumatoid arthritis, unspecified: Secondary | ICD-10-CM | POA: Diagnosis not present

## 2019-01-07 DIAGNOSIS — I110 Hypertensive heart disease with heart failure: Secondary | ICD-10-CM | POA: Insufficient documentation

## 2019-01-07 DIAGNOSIS — Y9389 Activity, other specified: Secondary | ICD-10-CM | POA: Insufficient documentation

## 2019-01-07 DIAGNOSIS — S81812A Laceration without foreign body, left lower leg, initial encounter: Secondary | ICD-10-CM | POA: Diagnosis not present

## 2019-01-07 DIAGNOSIS — I5040 Unspecified combined systolic (congestive) and diastolic (congestive) heart failure: Secondary | ICD-10-CM | POA: Diagnosis not present

## 2019-01-07 DIAGNOSIS — Y92121 Bathroom in nursing home as the place of occurrence of the external cause: Secondary | ICD-10-CM | POA: Insufficient documentation

## 2019-01-07 DIAGNOSIS — I4891 Unspecified atrial fibrillation: Secondary | ICD-10-CM | POA: Diagnosis not present

## 2019-01-07 DIAGNOSIS — W19XXXA Unspecified fall, initial encounter: Secondary | ICD-10-CM | POA: Diagnosis not present

## 2019-01-07 DIAGNOSIS — F0390 Unspecified dementia without behavioral disturbance: Secondary | ICD-10-CM | POA: Diagnosis not present

## 2019-01-07 DIAGNOSIS — S32000S Wedge compression fracture of unspecified lumbar vertebra, sequela: Secondary | ICD-10-CM | POA: Diagnosis not present

## 2019-01-07 MED ORDER — ACETAMINOPHEN 325 MG PO TABS
650.0000 mg | ORAL_TABLET | Freq: Once | ORAL | Status: AC
  Administered 2019-01-07: 650 mg via ORAL
  Filled 2019-01-07: qty 2

## 2019-01-07 MED ORDER — HYDROCODONE-ACETAMINOPHEN 5-325 MG PO TABS
1.0000 | ORAL_TABLET | Freq: Once | ORAL | Status: AC
  Administered 2019-01-07: 1 via ORAL
  Filled 2019-01-07: qty 1

## 2019-01-07 MED ORDER — LIDOCAINE HCL (PF) 1 % IJ SOLN
10.0000 mL | Freq: Once | INTRAMUSCULAR | Status: AC
  Administered 2019-01-08: 10 mL
  Filled 2019-01-07: qty 30

## 2019-01-07 NOTE — ED Notes (Signed)
Bed: [facility] Expected date:  Expected time:  Means of arrival:  Comments: EMS 85 yo female from SNF-fall/hit head-no blood thinners-laceration to LLE 140/70 HR 60 O2 sat 99% RA

## 2019-01-07 NOTE — ED Notes (Signed)
Bed: [facility]06 Expected date:  Expected time:  Means of arrival:  Comments: [facility] 

## 2019-01-07 NOTE — ED Triage Notes (Signed)
Patient arrives by [facility] from [place] after a fall-transferring from the chair to the commode by herself and her pants got caught on the chair and the bottom of the chair injured her LLE-avulsed area lateral left lower extremity-EMS reports draining fluid. Hx a-fib-not on xarelto at this time. Patient is under [facility] and has a Yellow DNR form from facility. Patient hit head with no LOC-hit back of right head on chair-unable to apply c-collar due to severe pain from a previous shoulder surgery-patient has severe DJD.

## 2019-01-08 DIAGNOSIS — I5040 Unspecified combined systolic (congestive) and diastolic (congestive) heart failure: Secondary | ICD-10-CM | POA: Diagnosis not present

## 2019-01-08 DIAGNOSIS — G459 Transient cerebral ischemic attack, unspecified: Secondary | ICD-10-CM | POA: Diagnosis not present

## 2019-01-08 DIAGNOSIS — I10 Essential (primary) hypertension: Secondary | ICD-10-CM | POA: Diagnosis not present

## 2019-01-08 DIAGNOSIS — M2550 Pain in unspecified joint: Secondary | ICD-10-CM | POA: Diagnosis not present

## 2019-01-08 DIAGNOSIS — F0390 Unspecified dementia without behavioral disturbance: Secondary | ICD-10-CM | POA: Diagnosis not present

## 2019-01-08 DIAGNOSIS — I4891 Unspecified atrial fibrillation: Secondary | ICD-10-CM | POA: Diagnosis not present

## 2019-01-08 DIAGNOSIS — S32000S Wedge compression fracture of unspecified lumbar vertebra, sequela: Secondary | ICD-10-CM | POA: Diagnosis not present

## 2019-01-08 DIAGNOSIS — L89610 Pressure ulcer of right heel, unstageable: Secondary | ICD-10-CM | POA: Diagnosis not present

## 2019-01-08 DIAGNOSIS — M069 Rheumatoid arthritis, unspecified: Secondary | ICD-10-CM | POA: Diagnosis not present

## 2019-01-08 DIAGNOSIS — Z7401 Bed confinement status: Secondary | ICD-10-CM | POA: Diagnosis not present

## 2019-01-08 MED ORDER — CLOTRIMAZOLE 10 MG MT TROC: 10.0000 mg | Freq: Every day | OROMUCOSAL | 0 refills | Status: DC

## 2019-01-08 MED ORDER — CLOTRIMAZOLE 10 MG MT TROC
10.0000 mg | Freq: Every day | OROMUCOSAL | Status: DC
  Administered 2019-01-08: 10 mg via ORAL
  Filled 2019-01-08: qty 1

## 2019-01-08 NOTE — ED Notes (Signed)
[facility] called for transport.  

## 2019-01-08 NOTE — ED Provider Notes (Addendum)
[place] DEPT Provider Note   CSN: [id number] Arrival date & time: 01/07/19  2007    History   Chief Complaint Chief Complaint  Patient presents with  . Fall  . Laceration LLE    HPI [name] is a 85 y.o. female.     HPI Patient was transferring from the commode to her resume her chair when her pants got caught and caused her to fall such that her leg got caught on the wheel of her chair.  This created a large laceration on her left lower leg.  Patient reports that she did not really get other injuries.  She was previously on Xarelto but is no longer taking it.  Patient denies any headache, confusion, neck pain, chest pain or abdominal pain.  She reports that she has chronic problems including a wound on her buttocks, chronic drainage from her lower legs and chronic back pain with compression fractures that are old.  If she does not feel that she sustained any new fractures or injuries to her neck and back. Past Medical History:  Diagnosis Date  . Arthritis    severe  . Atrial fibrillation ([place])   . Chronic diastolic CHF (congestive heart failure) ([place])   . Chronic insomnia   . Degenerative arthritis   . Essential hypertension   . Hypercholesteremia   . Memory deficit 05/24/2013  . Osteoporosis   . PAF (paroxysmal atrial fibrillation) ([place])    a. Intolerant to amiodarone, discontinued October 2012.  Marland Kitchen RLS (restless legs syndrome) 08/11/2016  . Sinus bradycardia   . Stroke [facility]) 2007   mini stroke or TIA with aphasia     Patient Active Problem List   Diagnosis Date Noted  . Anemia in other chronic diseases classified elsewhere 12/21/2018  . Severe anemia 12/21/2018  . Atypical atrial flutter ([place])   . CHF (congestive heart failure) ([place]) 04/06/2018  . Hyponatremia 04/06/2018  . Acute respiratory failure ([place]) 04/06/2018  . Normocytic normochromic anemia 04/06/2018  . RLS (restless legs syndrome) 08/11/2016  . Demand ischemia ([place])  05/27/2016  . Acute on chronic diastolic congestive heart failure ([place]) 05/26/2016  . Atrial fibrillation with RVR ([place]) 05/26/2016  . Insomnia 01/22/2016  . Sinus bradycardia   . Chronic diastolic CHF (congestive heart failure) ([place])   . Essential hypertension   . PAF (paroxysmal atrial fibrillation) ([place])   . Stroke ([place])   . Memory deficit 05/24/2013  . Arthritis 10/13/2011  . Long term (current) use of anticoagulants 07/21/2011  . Dyslipidemia 07/10/2011    Past Surgical History:  Procedure Laterality Date  . CATARACT EXTRACTION  2009   ou  . FEMUR FRACTURE SURGERY  8/12  . left knee  2001  . REPLACEMENT TOTAL KNEE BILATERAL Right 1999  . right intertrochanteric hip fracture status post ORIF  August 2012  . right shoulder replacement  2009     OB History   No obstetric history on file.      Home Medications    Prior to Admission medications   Medication Sig Start Date End Date Taking? Authorizing Provider  acetaminophen (TYLENOL) 500 MG tablet Take 500 mg by mouth 2 (two) times daily as needed for mild pain.   Yes [provider]  amiodarone (PACERONE) 400 MG tablet Take 400 mg by mouth daily.   Yes [provider]  BACITRAYCIN PLUS 500 UNIT/GM ointment Apply 1 application topically daily.  12/28/18  Yes [provider]  diclofenac sodium (VOLTAREN) 1 %  GEL Apply 2 g topically 2 (two) times daily.    Yes [provider]  Eszopiclone 3 MG TABS Take 3 mg by mouth at bedtime as needed (sleep). Take immediately before bedtime    Yes [provider]  fentaNYL (DURAGESIC) 50 MCG/HR Place 1 patch onto the skin every 3 (three) days.   Yes [provider]  furosemide (LASIX) 40 MG tablet Take 1 tablet (40 mg total) by mouth every other day. Patient taking differently: Take 40 mg by mouth daily.  08/17/18  Yes Burtis Junes, NP  isosorbide mononitrate (IMDUR) 30 MG 24 hr tablet Take 1 tablet (30 mg total) by mouth daily.  05/23/18  Yes Burtis Junes, NP  metoprolol succinate (TOPROL-XL) 25 MG 24 hr tablet Take 0.5 tablets (12.5 mg total) by mouth daily. Patient taking differently: Take 25 mg by mouth daily.  08/17/18  Yes Burtis Junes, NP  Multiple Vitamins-Minerals (ICAPS AREDS 2 PO) Take 1 capsule by mouth 2 (two) times daily.   Yes [provider]  ondansetron (ZOFRAN) 8 MG tablet Take 8 mg by mouth 2 (two) times daily as needed for nausea or vomiting.   Yes [provider]  amiodarone (PACERONE) 200 MG tablet Take 1 tablet (200 mg total) by mouth daily. Patient not taking: Reported on 01/07/2019 08/17/18   Burtis Junes, NP  amoxicillin-clavulanate (AUGMENTIN) 500-125 MG tablet Take 1 tablet (500 mg total) by mouth 2 (two) times daily. Patient not taking: Reported on 01/07/2019 12/22/18   Caren Griffins, MD  clotrimazole (MYCELEX) 10 MG troche Take 1 tablet (10 mg total) by mouth 5 (five) times daily. 01/08/19   Charlesetta Shanks, MD  collagenase (SANTYL) ointment Apply topically daily. Patient not taking: Reported on 01/07/2019 12/22/18   Caren Griffins, MD  hydrOXYzine (ATARAX/VISTARIL) 10 MG tablet Take 1 tablet (10 mg total) by mouth 3 (three) times daily as needed for anxiety. Patient not taking: Reported on 01/07/2019 04/11/18   Donne Hazel, MD  LORazepam (ATIVAN) 0.5 MG tablet Take 0.25 mg by mouth at bedtime as needed for anxiety.  07/20/18   [provider]  Melatonin 3 MG TABS Take 3 tablets (9 mg total) by mouth at bedtime as needed (for sleep). Patient not taking: Reported on 01/07/2019 04/11/18   Donne Hazel, MD  nitroGLYCERIN (NITROSTAT) 0.4 MG SL tablet DISSOLVE 1 TABLET UNDER THE TONGUE EVERY 5 MINUTES AS NEEDED FOR CHEST PAIN Patient taking differently: Place 0.4 mg under the tongue every 5 (five) minutes as needed.  03/14/18   Burtis Junes, NP  pantoprazole (PROTONIX) 40 MG tablet Take 1 tablet (40 mg total) by mouth daily. Patient not taking: Reported on  01/07/2019 12/22/18 2019-03-05  Caren Griffins, MD  traMADol (ULTRAM) 50 MG tablet Take 2 tablets (100 mg total) by mouth every 6 (six) hours as needed for severe pain. Patient not taking: Reported on 01/07/2019 04/11/18   Donne Hazel, MD    Family History Family History  Problem Relation Age of Onset  . Dementia Mother   . Stroke Mother   . Cancer - Colon Father   . Cancer Father   . Cancer Sister   . Cancer Son   . Cancer Sister   . Coronary artery disease Other     Social History Social History   Tobacco Use  . Smoking status: Never Smoker  . Smokeless tobacco: Never Used  Substance Use Topics  . Alcohol use: Yes  Alcohol/week: 7.0 standard drinks    Types: 7 Glasses of wine per week    Comment: 1 glass wine with dinner some days  . Drug use: No     Allergies   Codeine   Review of Systems Review of Systems 10 Systems reviewed and are negative for acute change except as noted in the HPI.   Physical Exam Updated Vital Signs BP (!) 141/59   Pulse (!) 59   Temp 98.4 F (36.9 C) (Oral)   Resp 19   SpO2 100%   Physical Exam Constitutional:      Appearance: Normal appearance.  HENT:     Head: Normocephalic and atraumatic.     Mouth/Throat:     Mouth: Mucous membranes are moist.     Comments: Patient has some dryness of the lips and erythema of the tongue. Eyes:     Extraocular Movements: Extraocular movements intact.  Neck:     Musculoskeletal: Neck supple.     Comments: No C-spine tenderness to palpation. Cardiovascular:     Rate and Rhythm: Normal rate and regular rhythm.  Pulmonary:     Effort: Pulmonary effort is normal.     Breath sounds: Normal breath sounds.  Chest:     Chest wall: No tenderness.  Abdominal:     General: There is no distension.     Palpations: Abdomen is soft.     Tenderness: There is no abdominal tenderness. There is no guarding.  Musculoskeletal:     Comments: Patient has an approximately 10 cm skin flap of the left  lateral lower leg.  This flap is extremely thin and can be elevated to view the subcutaneous fat tissue.  No active bleeding.  Skin:    General: Skin is warm and dry.  Neurological:     General: No focal deficit present.     Mental Status: She is alert and oriented to person, place, and time.     Coordination: Coordination normal.  Psychiatric:        Mood and Affect: Mood normal.          ED Treatments / Results  Labs (all labs ordered are listed, but only abnormal results are displayed) Labs Reviewed - No data to display  EKG None  Radiology No results found.  Procedures .Marland KitchenLaceration Repair Date/Time: 01/08/2019 12:46 AM Performed by: Charlesetta Shanks, MD Authorized by: Charlesetta Shanks, MD   Consent:    Consent obtained:  Verbal   Consent given by:  Patient   Risks discussed:  Infection, pain, poor cosmetic result, need for additional repair and poor wound healing Anesthesia (see MAR for exact dosages):    Anesthesia method:  Local infiltration   Local anesthetic:  Lidocaine 1% w/o epi Laceration details:    Location:  Leg   Leg location:  L lower leg   Length (cm):  10   Depth (mm):  4 Repair type:    Repair type:  Simple Pre-procedure details:    Preparation:  Patient was prepped and draped in usual sterile fashion Exploration:    Wound extent: areolar tissue violated     Contaminated: no   Treatment:    Area cleansed with:  Saline and Shur-Clens   Amount of cleaning:  Extensive   Irrigation solution:  Sterile saline Skin repair:    Repair method:  Sutures   Suture size:  4-0   Suture technique:  Simple interrupted   Number of sutures:  4 Approximation:    Approximation:  Loose  Post-procedure details:    Dressing:  Non-adherent dressing and bulky dressing   Patient tolerance of procedure:  Tolerated well, no immediate complications   (including critical care time)  Medications Ordered in ED Medications  clotrimazole (MYCELEX) troche 10 mg (has  no administration in time range)  HYDROcodone-acetaminophen (NORCO/VICODIN) 5-325 MG per tablet 1 tablet (1 tablet Oral Given 01/07/19 2249)  acetaminophen (TYLENOL) tablet 650 mg (650 mg Oral Given 01/07/19 2249)  lidocaine (PF) (XYLOCAINE) 1 % injection 10 mL (10 mLs Infiltration Given 01/08/19 0011)     Initial Impression / Assessment and Plan / ED Course  I have reviewed the triage vital signs and the nursing notes.  Pertinent labs & imaging results that were available during my care of the patient were reviewed by me and considered in my medical decision making (see chart for details).        Patient caught her leg on her motorized scooter chair.  She has a flap laceration that does not have sufficient skin thickness to cover the entirety of the wound.  This was extensively cleaned and the proximal portions of the wound were tacked down with suture.  However, there is not sufficient elasticity to cover all of the wound.  Also the skin is too thin and would not tolerate tension.  It has been repositioned and dressed with Xeroform and Kerlix.  Patient will need ongoing wound care.  Patient also requests that she be given Mycelex Truax she is reporting that she was diagnosed with thrush but they were unable to fill her prescription over the weekend.  Patient is otherwise clinically alert and in no distress.  No signs of intracranial, intrathoracic or intra-abdominal injury.  Patient will need close attention to wound care.  Final Clinical Impressions(s) / ED Diagnoses   Final diagnoses:  Skin tear of left lower leg without complication, initial encounter    ED Discharge Orders         Ordered    clotrimazole (MYCELEX) 10 MG troche  5 times daily     01/08/19 0037           Charlesetta Shanks, MD 01/08/19 Alena Bills    Charlesetta Shanks, MD 01/08/19 [phone]

## 2019-01-08 NOTE — ED Notes (Signed)
Antifungal given, PTAR at bedside, Roselie Awkward, RN assisted with wrapping legs.

## 2019-01-08 NOTE — Discharge Instructions (Addendum)
1.  Patient should be seen daily by hospice for wound care.  Patient needs outer dressing of left lower extremity changed 1-2 times daily.  Xeroform can be left in place over the wound for 3 days.  Change Xeroform dressing every 3 days.  Hospice to continue lower extremity wraps and heal padding and dressing with Kerlix and Covan with daily changes.  2.  Patient needs follow-up with wound care clinic for management of lower extremity wounds and venous stasis.  Schedule appointment within the next 3-5 days. 3.  Continue Mycelex troches 5 times a day as initiated in the emergency department.

## 2019-01-09 DIAGNOSIS — M069 Rheumatoid arthritis, unspecified: Secondary | ICD-10-CM | POA: Diagnosis not present

## 2019-01-09 DIAGNOSIS — I5040 Unspecified combined systolic (congestive) and diastolic (congestive) heart failure: Secondary | ICD-10-CM | POA: Diagnosis not present

## 2019-01-09 DIAGNOSIS — I4891 Unspecified atrial fibrillation: Secondary | ICD-10-CM | POA: Diagnosis not present

## 2019-01-09 DIAGNOSIS — F0390 Unspecified dementia without behavioral disturbance: Secondary | ICD-10-CM | POA: Diagnosis not present

## 2019-01-09 DIAGNOSIS — S32000S Wedge compression fracture of unspecified lumbar vertebra, sequela: Secondary | ICD-10-CM | POA: Diagnosis not present

## 2019-01-09 DIAGNOSIS — L89610 Pressure ulcer of right heel, unstageable: Secondary | ICD-10-CM | POA: Diagnosis not present

## 2019-01-10 DIAGNOSIS — S32000S Wedge compression fracture of unspecified lumbar vertebra, sequela: Secondary | ICD-10-CM | POA: Diagnosis not present

## 2019-01-10 DIAGNOSIS — M069 Rheumatoid arthritis, unspecified: Secondary | ICD-10-CM | POA: Diagnosis not present

## 2019-01-10 DIAGNOSIS — I5040 Unspecified combined systolic (congestive) and diastolic (congestive) heart failure: Secondary | ICD-10-CM | POA: Diagnosis not present

## 2019-01-10 DIAGNOSIS — L89610 Pressure ulcer of right heel, unstageable: Secondary | ICD-10-CM | POA: Diagnosis not present

## 2019-01-10 DIAGNOSIS — F0390 Unspecified dementia without behavioral disturbance: Secondary | ICD-10-CM | POA: Diagnosis not present

## 2019-01-10 DIAGNOSIS — I4891 Unspecified atrial fibrillation: Secondary | ICD-10-CM | POA: Diagnosis not present

## 2019-01-11 DIAGNOSIS — M069 Rheumatoid arthritis, unspecified: Secondary | ICD-10-CM | POA: Diagnosis not present

## 2019-01-11 DIAGNOSIS — F0390 Unspecified dementia without behavioral disturbance: Secondary | ICD-10-CM | POA: Diagnosis not present

## 2019-01-11 DIAGNOSIS — I5040 Unspecified combined systolic (congestive) and diastolic (congestive) heart failure: Secondary | ICD-10-CM | POA: Diagnosis not present

## 2019-01-11 DIAGNOSIS — S32000S Wedge compression fracture of unspecified lumbar vertebra, sequela: Secondary | ICD-10-CM | POA: Diagnosis not present

## 2019-01-11 DIAGNOSIS — L89610 Pressure ulcer of right heel, unstageable: Secondary | ICD-10-CM | POA: Diagnosis not present

## 2019-01-11 DIAGNOSIS — I4891 Unspecified atrial fibrillation: Secondary | ICD-10-CM | POA: Diagnosis not present

## 2019-01-12 DIAGNOSIS — S32000S Wedge compression fracture of unspecified lumbar vertebra, sequela: Secondary | ICD-10-CM | POA: Diagnosis not present

## 2019-01-12 DIAGNOSIS — L89610 Pressure ulcer of right heel, unstageable: Secondary | ICD-10-CM | POA: Diagnosis not present

## 2019-01-12 DIAGNOSIS — I5040 Unspecified combined systolic (congestive) and diastolic (congestive) heart failure: Secondary | ICD-10-CM | POA: Diagnosis not present

## 2019-01-12 DIAGNOSIS — F0390 Unspecified dementia without behavioral disturbance: Secondary | ICD-10-CM | POA: Diagnosis not present

## 2019-01-12 DIAGNOSIS — M069 Rheumatoid arthritis, unspecified: Secondary | ICD-10-CM | POA: Diagnosis not present

## 2019-01-12 DIAGNOSIS — I4891 Unspecified atrial fibrillation: Secondary | ICD-10-CM | POA: Diagnosis not present

## 2019-01-13 ENCOUNTER — Encounter (HOSPITAL_COMMUNITY): Payer: Self-pay | Admitting: Emergency Medicine

## 2019-01-13 ENCOUNTER — Other Ambulatory Visit: Payer: Self-pay

## 2019-01-13 ENCOUNTER — Inpatient Hospital Stay (HOSPITAL_COMMUNITY)
Admission: EM | Admit: 2019-01-13 | Discharge: 2019-01-17 | DRG: 314 | Disposition: A | Discharge status: Skilled Nursing Facility | Source: Skilled Nursing Facility | Attending: Internal Medicine | Admitting: Internal Medicine

## 2019-01-13 DIAGNOSIS — L89610 Pressure ulcer of right heel, unstageable: Secondary | ICD-10-CM | POA: Diagnosis not present

## 2019-01-13 DIAGNOSIS — G2581 Restless legs syndrome: Secondary | ICD-10-CM | POA: Diagnosis present

## 2019-01-13 DIAGNOSIS — R112 Nausea with vomiting, unspecified: Secondary | ICD-10-CM | POA: Diagnosis not present

## 2019-01-13 DIAGNOSIS — I5040 Unspecified combined systolic (congestive) and diastolic (congestive) heart failure: Secondary | ICD-10-CM | POA: Diagnosis not present

## 2019-01-13 DIAGNOSIS — R627 Adult failure to thrive: Secondary | ICD-10-CM | POA: Diagnosis present

## 2019-01-13 DIAGNOSIS — Z8673 Personal history of transient ischemic attack (TIA), and cerebral infarction without residual deficits: Secondary | ICD-10-CM

## 2019-01-13 DIAGNOSIS — M810 Age-related osteoporosis without current pathological fracture: Secondary | ICD-10-CM | POA: Diagnosis present

## 2019-01-13 DIAGNOSIS — I9589 Other hypotension: Secondary | ICD-10-CM | POA: Diagnosis not present

## 2019-01-13 DIAGNOSIS — L89154 Pressure ulcer of sacral region, stage 4: Secondary | ICD-10-CM | POA: Diagnosis present

## 2019-01-13 DIAGNOSIS — I959 Hypotension, unspecified: Secondary | ICD-10-CM

## 2019-01-13 DIAGNOSIS — D638 Anemia in other chronic diseases classified elsewhere: Secondary | ICD-10-CM | POA: Diagnosis present

## 2019-01-13 DIAGNOSIS — E861 Hypovolemia: Secondary | ICD-10-CM

## 2019-01-13 DIAGNOSIS — I5042 Chronic combined systolic (congestive) and diastolic (congestive) heart failure: Secondary | ICD-10-CM | POA: Diagnosis present

## 2019-01-13 DIAGNOSIS — Z66 Do not resuscitate: Secondary | ICD-10-CM | POA: Diagnosis present

## 2019-01-13 DIAGNOSIS — Z515 Encounter for palliative care: Secondary | ICD-10-CM | POA: Diagnosis present

## 2019-01-13 DIAGNOSIS — Z823 Family history of stroke: Secondary | ICD-10-CM

## 2019-01-13 DIAGNOSIS — E785 Hyperlipidemia, unspecified: Secondary | ICD-10-CM | POA: Diagnosis present

## 2019-01-13 DIAGNOSIS — I5032 Chronic diastolic (congestive) heart failure: Secondary | ICD-10-CM

## 2019-01-13 DIAGNOSIS — S32000S Wedge compression fracture of unspecified lumbar vertebra, sequela: Secondary | ICD-10-CM | POA: Diagnosis not present

## 2019-01-13 DIAGNOSIS — L89612 Pressure ulcer of right heel, stage 2: Secondary | ICD-10-CM | POA: Diagnosis present

## 2019-01-13 DIAGNOSIS — N179 Acute kidney failure, unspecified: Secondary | ICD-10-CM | POA: Diagnosis present

## 2019-01-13 DIAGNOSIS — B370 Candidal stomatitis: Secondary | ICD-10-CM | POA: Diagnosis present

## 2019-01-13 DIAGNOSIS — I4891 Unspecified atrial fibrillation: Secondary | ICD-10-CM | POA: Diagnosis not present

## 2019-01-13 DIAGNOSIS — F0390 Unspecified dementia without behavioral disturbance: Secondary | ICD-10-CM | POA: Diagnosis not present

## 2019-01-13 DIAGNOSIS — Z818 Family history of other mental and behavioral disorders: Secondary | ICD-10-CM

## 2019-01-13 DIAGNOSIS — Z79890 Hormone replacement therapy (postmenopausal): Secondary | ICD-10-CM

## 2019-01-13 DIAGNOSIS — N184 Chronic kidney disease, stage 4 (severe): Secondary | ICD-10-CM | POA: Diagnosis present

## 2019-01-13 DIAGNOSIS — I480 Paroxysmal atrial fibrillation: Secondary | ICD-10-CM | POA: Diagnosis present

## 2019-01-13 DIAGNOSIS — Z791 Long term (current) use of non-steroidal anti-inflammatories (NSAID): Secondary | ICD-10-CM

## 2019-01-13 DIAGNOSIS — Z885 Allergy status to narcotic agent status: Secondary | ICD-10-CM

## 2019-01-13 DIAGNOSIS — E860 Dehydration: Secondary | ICD-10-CM | POA: Diagnosis present

## 2019-01-13 DIAGNOSIS — D631 Anemia in chronic kidney disease: Secondary | ICD-10-CM | POA: Diagnosis present

## 2019-01-13 DIAGNOSIS — Z79899 Other long term (current) drug therapy: Secondary | ICD-10-CM

## 2019-01-13 DIAGNOSIS — R0902 Hypoxemia: Secondary | ICD-10-CM | POA: Diagnosis not present

## 2019-01-13 DIAGNOSIS — R197 Diarrhea, unspecified: Secondary | ICD-10-CM

## 2019-01-13 DIAGNOSIS — Z79891 Long term (current) use of opiate analgesic: Secondary | ICD-10-CM

## 2019-01-13 DIAGNOSIS — Z6820 Body mass index (BMI) 20.0-20.9, adult: Secondary | ICD-10-CM

## 2019-01-13 DIAGNOSIS — I130 Hypertensive heart and chronic kidney disease with heart failure and stage 1 through stage 4 chronic kidney disease, or unspecified chronic kidney disease: Secondary | ICD-10-CM | POA: Diagnosis present

## 2019-01-13 DIAGNOSIS — F5104 Psychophysiologic insomnia: Secondary | ICD-10-CM | POA: Diagnosis present

## 2019-01-13 DIAGNOSIS — M069 Rheumatoid arthritis, unspecified: Secondary | ICD-10-CM | POA: Diagnosis not present

## 2019-01-13 DIAGNOSIS — Z8249 Family history of ischemic heart disease and other diseases of the circulatory system: Secondary | ICD-10-CM

## 2019-01-13 LAB — COMPREHENSIVE METABOLIC PANEL
ALT: 35 U/L (ref 0–44)
AST: 33 U/L (ref 15–41)
Albumin: 2.3 g/dL — ABNORMAL LOW (ref 3.5–5.0)
Alkaline Phosphatase: 93 U/L (ref 38–126)
Anion gap: 10 (ref 5–15)
BUN: 94 mg/dL — ABNORMAL HIGH (ref 8–23)
CO2: 26 mmol/L (ref 22–32)
Calcium: 8.6 mg/dL — ABNORMAL LOW (ref 8.9–10.3)
Chloride: 100 mmol/L (ref 98–111)
Creatinine, Ser: 2.03 mg/dL — ABNORMAL HIGH (ref 0.44–1.00)
GFR calc Af Amer: 25 mL/min — ABNORMAL LOW (ref >60)
GFR calc non Af Amer: 21 mL/min — ABNORMAL LOW (ref >60)
Glucose, Bld: 90 mg/dL (ref 70–99)
POTASSIUM: 4.4 mmol/L (ref 3.5–5.1)
Sodium: 136 mmol/L (ref 135–145)
Total Bilirubin: 0.6 mg/dL (ref 0.3–1.2)
Total Protein: 5.5 g/dL — ABNORMAL LOW (ref 6.5–8.1)

## 2019-01-13 LAB — URINALYSIS, ROUTINE W REFLEX MICROSCOPIC
Bilirubin Urine: NEGATIVE
Glucose, UA: NEGATIVE mg/dL
Hgb urine dipstick: NEGATIVE
Ketones, ur: NEGATIVE mg/dL
Nitrite: POSITIVE — AB
Protein, ur: NEGATIVE mg/dL
Specific Gravity, Urine: 1.014 (ref 1.005–1.030)
pH: 5 (ref 5.0–8.0)

## 2019-01-13 LAB — MRSA PCR SCREENING: MRSA by PCR: NEGATIVE

## 2019-01-13 LAB — CBC
HCT: 24.4 % — ABNORMAL LOW (ref 36.0–46.0)
Hemoglobin: 7.2 g/dL — ABNORMAL LOW (ref 12.0–15.0)
MCH: 23.1 pg — ABNORMAL LOW (ref 26.0–34.0)
MCHC: 29.5 g/dL — ABNORMAL LOW (ref 30.0–36.0)
MCV: 78.2 fL — ABNORMAL LOW (ref 80.0–100.0)
Platelets: 269 K/uL (ref 150–400)
RBC: 3.12 MIL/uL — ABNORMAL LOW (ref 3.87–5.11)
RDW: 24.9 % — ABNORMAL HIGH (ref 11.5–15.5)
WBC: 6.1 K/uL (ref 4.0–10.5)
nRBC: 0 % (ref 0.0–0.2)

## 2019-01-13 MED ORDER — POLYETHYLENE GLYCOL 3350 17 G PO PACK: 17.0000 g | PACK | Freq: Every day | ORAL | Status: DC | PRN

## 2019-01-13 MED ORDER — NYSTATIN 100000 UNIT/ML MT SUSP
5.0000 mL | Freq: Four times a day (QID) | OROMUCOSAL | Status: DC
  Administered 2019-01-13 – 2019-01-17 (×13): 500000 [IU] via ORAL
  Filled 2019-01-13 (×13): qty 5

## 2019-01-13 MED ORDER — ONDANSETRON HCL 4 MG PO TABS
8.0000 mg | ORAL_TABLET | Freq: Two times a day (BID) | ORAL | Status: DC | PRN
  Administered 2019-01-14 – 2019-01-15 (×2): 8 mg via ORAL
  Filled 2019-01-13 (×2): qty 2

## 2019-01-13 MED ORDER — ACETAMINOPHEN 650 MG RE SUPP: 650.0000 mg | Freq: Four times a day (QID) | RECTAL | Status: DC | PRN

## 2019-01-13 MED ORDER — ACETAMINOPHEN 325 MG PO TABS: 650.0000 mg | ORAL_TABLET | Freq: Four times a day (QID) | ORAL | Status: DC | PRN

## 2019-01-13 MED ORDER — SODIUM CHLORIDE 0.9 % IV BOLUS
1000.0000 mL | Freq: Once | INTRAVENOUS | Status: AC
  Administered 2019-01-13: 1000 mL via INTRAVENOUS

## 2019-01-13 MED ORDER — ENOXAPARIN SODIUM 30 MG/0.3ML ~~LOC~~ SOLN
30.0000 mg | SUBCUTANEOUS | Status: DC
  Administered 2019-01-13 – 2019-01-16 (×4): 30 mg via SUBCUTANEOUS
  Filled 2019-01-13 (×4): qty 0.3

## 2019-01-13 MED ORDER — HYDROCODONE-ACETAMINOPHEN 5-325 MG PO TABS
1.0000 | ORAL_TABLET | ORAL | Status: DC | PRN
  Administered 2019-01-13: 2 via ORAL
  Administered 2019-01-14: 1 via ORAL
  Administered 2019-01-14: 2 via ORAL
  Administered 2019-01-15 (×5): 1 via ORAL
  Administered 2019-01-16 (×3): 2 via ORAL
  Administered 2019-01-16: 1 via ORAL
  Administered 2019-01-17: 2 via ORAL
  Filled 2019-01-13: qty 2
  Filled 2019-01-13 (×4): qty 1
  Filled 2019-01-13 (×4): qty 2
  Filled 2019-01-13 (×4): qty 1
  Filled 2019-01-13: qty 2

## 2019-01-13 MED ORDER — BISACODYL 10 MG RE SUPP: 10.0000 mg | Freq: Every day | RECTAL | Status: DC | PRN

## 2019-01-13 MED ORDER — AMIODARONE HCL 200 MG PO TABS
400.0000 mg | ORAL_TABLET | Freq: Every day | ORAL | Status: DC
  Administered 2019-01-13 – 2019-01-17 (×5): 400 mg via ORAL
  Filled 2019-01-13 (×5): qty 2

## 2019-01-13 MED ORDER — ONDANSETRON HCL 4 MG/2ML IJ SOLN
4.0000 mg | Freq: Once | INTRAMUSCULAR | Status: AC
  Administered 2019-01-13: 4 mg via INTRAVENOUS
  Filled 2019-01-13: qty 2

## 2019-01-13 MED ORDER — SODIUM CHLORIDE 0.9 % IV SOLN
INTRAVENOUS | Status: DC
  Administered 2019-01-13 – 2019-01-14 (×2): via INTRAVENOUS

## 2019-01-13 NOTE — H&P (Signed)
History and Physical    [name] ZDG:[id number] DOB: May 30, 1928 DOA: 01/13/2019  PCP: Lajean Manes, MD Patient coming from: Assisted living facility where she was on hospice  Chief Complaint: Nausea and vomiting  HPI: [name] is a 85 y.o. female with medical history significant of with past medical history of paroxysmal atrial fibrillation, diastolic CHF, insomnia, failure to thrive, stage IV sacral decubitus ulcer essential hypertension was sent to the hospital from her living facility for evaluation of nausea, vomiting and diarrhea.  Per patient she tells me she had one episode of nausea yesterday evening with one episode of nonbloody vomiting and nonbloody diarrhea.  She appeared slightly weak this morning therefore she was brought to the ER for further evaluation.  She denies any fevers, chills and other complaints.  She was admitted here for symptomatic anemia and failure to thrive about a month ago. She has been followed by hospice team at the facility. In the ER patient was initially noted to have soft blood pressures responded to IV fluids.  Hemoglobin is more or less close to her baseline 7.2.  MCV is 78.2.  No obvious signs of active bleeding.  No obvious signs of infection as well.  Sacral decubitus does not look infected either.  To me she also reports of whitish spot in her mouth previously treated as oral thrush.  Review of Systems: As per HPI otherwise 10 point review of systems negative.  Review of Systems Otherwise negative except as per HPI, including: General: Denies fever, chills, night sweats or unintended weight loss. Resp: Denies cough, wheezing, shortness of breath. Cardiac: Denies chest pain, palpitations, orthopnea, paroxysmal nocturnal dyspnea. GI: Denies  constipation GU: Denies dysuria, frequency, hesitancy or incontinence MS: Denies muscle aches, joint pain or swelling Neuro: Denies headache, neurologic deficits (focal weakness, numbness, tingling), abnormal  gait Psych: Denies anxiety, depression, SI/HI/AVH Skin: Denies new rashes or lesions ID: Denies sick contacts, exotic exposures, travel  Past Medical History:  Diagnosis Date  . Arthritis    severe  . Atrial fibrillation ([place])   . Chronic diastolic CHF (congestive heart failure) ([place])   . Chronic insomnia   . Degenerative arthritis   . Essential hypertension   . Hypercholesteremia   . Memory deficit 05/24/2013  . Osteoporosis   . PAF (paroxysmal atrial fibrillation) ([place])    a. Intolerant to amiodarone, discontinued October 2012.  Marland Kitchen RLS (restless legs syndrome) 08/11/2016  . Sinus bradycardia   . Stroke [facility]) 2007   mini stroke or TIA with aphasia     Past Surgical History:  Procedure Laterality Date  . CATARACT EXTRACTION  2009   ou  . FEMUR FRACTURE SURGERY  8/12  . left knee  2001  . REPLACEMENT TOTAL KNEE BILATERAL Right 1999  . right intertrochanteric hip fracture status post ORIF  August 2012  . right shoulder replacement  2009    SOCIAL HISTORY:  reports that she has never smoked. She has never used smokeless tobacco. She reports current alcohol use of about 7.0 standard drinks of alcohol per week. She reports that she does not use drugs.  Allergies  Allergen Reactions  . Codeine Nausea Only    FAMILY HISTORY: Family History  Problem Relation Age of Onset  . Dementia Mother   . Stroke Mother   . Cancer - Colon Father   . Cancer Father   . Cancer Sister   . Cancer Son   . Cancer Sister   . Coronary artery disease Other  Prior to Admission medications   Medication Sig Start Date End Date Taking? Authorizing Provider  acetaminophen (TYLENOL) 500 MG tablet Take 500 mg by mouth 2 (two) times daily as needed for mild pain.   Yes [provider]  amiodarone (PACERONE) 400 MG tablet Take 400 mg by mouth daily.   Yes [provider]  BACITRAYCIN PLUS 500 UNIT/GM ointment Apply 1 application topically daily.  12/28/18  Yes [provider]  clotrimazole (MYCELEX) 10 MG troche Take 1 tablet (10 mg total) by mouth 5 (five) times daily. 01/08/19  Yes Charlesetta Shanks, MD  diclofenac sodium (VOLTAREN) 1 % GEL Apply 2 g topically 2 (two) times daily.    Yes [provider]  Eszopiclone 3 MG TABS Take 3 mg by mouth at bedtime as needed (sleep). Take immediately before bedtime    Yes [provider]  fentaNYL (DURAGESIC) 50 MCG/HR Place 1 patch onto the skin every 3 (three) days.   Yes [provider]  furosemide (LASIX) 40 MG tablet Take 1 tablet (40 mg total) by mouth every other day. Patient taking differently: Take 40 mg by mouth daily.  08/17/18  Yes Burtis Junes, NP  isosorbide mononitrate (IMDUR) 30 MG 24 hr tablet Take 1 tablet (30 mg total) by mouth daily. 05/23/18  Yes Burtis Junes, NP  LORazepam (ATIVAN) 0.5 MG tablet Take 0.25 mg by mouth at bedtime as needed for anxiety.  07/20/18  Yes [provider]  metoprolol succinate (TOPROL-XL) 25 MG 24 hr tablet Take 0.5 tablets (12.5 mg total) by mouth daily. Patient taking differently: Take 25 mg by mouth daily.  08/17/18  Yes Burtis Junes, NP  Multiple Vitamins-Minerals (ICAPS AREDS 2 PO) Take 1 capsule by mouth 2 (two) times daily.   Yes [provider]  nitroGLYCERIN (NITROSTAT) 0.4 MG SL tablet DISSOLVE 1 TABLET UNDER THE TONGUE EVERY 5 MINUTES AS NEEDED FOR CHEST PAIN Patient taking differently: Place 0.4 mg under the tongue every 5 (five) minutes as needed.  03/14/18  Yes Burtis Junes, NP  ondansetron (ZOFRAN) 8 MG tablet Take 8 mg by mouth 2 (two) times daily as needed for nausea or vomiting.   Yes [provider]  amiodarone (PACERONE) 200 MG tablet Take 1 tablet (200 mg total) by mouth daily. Patient not taking: Reported on 01/13/2019 08/17/18   Burtis Junes, NP  amoxicillin-clavulanate (AUGMENTIN) 500-125 MG tablet Take 1 tablet (500 mg total) by mouth 2 (two) times daily. Patient not taking:  Reported on 01/07/2019 12/22/18   Caren Griffins, MD  collagenase (SANTYL) ointment Apply topically daily. Patient not taking: Reported on 01/07/2019 12/22/18   Caren Griffins, MD  hydrOXYzine (ATARAX/VISTARIL) 10 MG tablet Take 1 tablet (10 mg total) by mouth 3 (three) times daily as needed for anxiety. Patient not taking: Reported on 01/07/2019 04/11/18   Donne Hazel, MD  Melatonin 3 MG TABS Take 3 tablets (9 mg total) by mouth at bedtime as needed (for sleep). Patient not taking: Reported on 01/07/2019 04/11/18   Donne Hazel, MD  pantoprazole (PROTONIX) 40 MG tablet Take 1 tablet (40 mg total) by mouth daily. Patient not taking: Reported on 01/07/2019 12/22/18 Mar 10, 2019  Caren Griffins, MD  traMADol (ULTRAM) 50 MG tablet Take 2 tablets (100 mg total) by mouth every 6 (six) hours as needed for severe pain. Patient not taking: Reported on 01/07/2019 04/11/18   Donne Hazel, MD    Physical Exam: Vitals:  01/13/19 1545 01/13/19 1600 01/13/19 1615 01/13/19 1630  BP:  (!) 94/47  (!) 95/48  Pulse: 72 72 70 74  Resp: 17 13 (!) 21 (!) 22  Temp:      TempSrc:      SpO2: 99% 95% 93% 99%  Weight:      Height:          Constitutional: NAD, calm, comfortable, elderly frail-appearing Eyes: PERRL, lids and conjunctivae normal ENMT: Mucous membranes are Dry Posterior pharynx clear of any exudate or lesions.Normal dentition.  Neck: normal, supple, no masses, no thyromegaly Respiratory: clear to auscultation bilaterally, no wheezing, no crackles. Normal respiratory effort. No accessory muscle use.  Cardiovascular: Regular rate and rhythm, no murmurs / rubs / gallops. No extremity edema. 2+ pedal pulses. No carotid bruits.  Abdomen: no tenderness, no masses palpated. No hepatosplenomegaly. Bowel sounds positive.  Musculoskeletal: no clubbing / cyanosis. No joint deformity upper and lower extremities. Good ROM, no contractures. Normal muscle tone.  Skin: no rashes, lesions, ulcers. No induration  Neurologic: CN 2-12 grossly intact. Sensation intact, DTR normal. Strength 4/5 in all 4.  Psychiatric: Normal judgment and insight. Alert and oriented x 2-3  (baseline). Normal mood.  Stage IV sacral decubitus ulcer without any evidence of active infection   Labs on Admission: I have personally reviewed following labs and imaging studies  CBC: Recent Labs  Lab 01/13/19 1430  WBC 6.1  HGB 7.2*  HCT 24.4*  MCV 78.2*  PLT 109   Basic Metabolic Panel: Recent Labs  Lab 01/13/19 1430  NA 136  K 4.4  CL 100  CO2 26  GLUCOSE 90  BUN 94*  CREATININE 2.03*  CALCIUM 8.6*   GFR: Estimated Creatinine Clearance: 12.4 mL/min (A) (by C-G formula based on SCr of 2.03 mg/dL (H)). Liver Function Tests: Recent Labs  Lab 01/13/19 1430  AST 33  ALT 35  ALKPHOS 93  BILITOT 0.6  PROT 5.5*  ALBUMIN 2.3*   No results for input(s): LIPASE, AMYLASE in the last 168 hours. No results for input(s): AMMONIA in the last 168 hours. Coagulation Profile: No results for input(s): INR, PROTIME in the last 168 hours. Cardiac Enzymes: No results for input(s): CKTOTAL, CKMB, CKMBINDEX, TROPONINI in the last 168 hours. BNP (last 3 results) No results for input(s): PROBNP in the last 8760 hours. HbA1C: No results for input(s): HGBA1C in the last 72 hours. CBG: No results for input(s): GLUCAP in the last 168 hours. Lipid Profile: No results for input(s): CHOL, HDL, LDLCALC, TRIG, CHOLHDL, LDLDIRECT in the last 72 hours. Thyroid Function Tests: No results for input(s): TSH, T4TOTAL, FREET4, T3FREE, THYROIDAB in the last 72 hours. Anemia Panel: No results for input(s): VITAMINB12, FOLATE, FERRITIN, TIBC, IRON, RETICCTPCT in the last 72 hours. Urine analysis:    Component Value Date/Time   COLORURINE YELLOW 01/13/2019 1638   APPEARANCEUR CLEAR 01/13/2019 1638   LABSPEC 1.014 01/13/2019 1638   PHURINE 5.0 01/13/2019 1638   GLUCOSEU NEGATIVE 01/13/2019 1638   HGBUR NEGATIVE 01/13/2019 1638    BILIRUBINUR NEGATIVE 01/13/2019 1638   KETONESUR NEGATIVE 01/13/2019 1638   PROTEINUR NEGATIVE 01/13/2019 1638   UROBILINOGEN 0.2 08/16/2011 0227   NITRITE POSITIVE (A) 01/13/2019 1638   LEUKOCYTESUR TRACE (A) 01/13/2019 1638   Sepsis Labs: !!!!!!!!!!!!!!!!!!!!!!!!!!!!!!!!!!!!!!!!!!!! @LABRCNTIP (procalcitonin:4,lacticidven:4) )No results found for this or any previous visit (from the past 240 hour(s)).   Radiological Exams on Admission: No results found.   All images have been reviewed by me personally.    Assessment/Plan  Principal Problem:   Hypotension Active Problems:   Dyslipidemia   Chronic diastolic CHF (congestive heart failure) (HCC)   PAF (paroxysmal atrial fibrillation) (HCC)   RLS (restless legs syndrome)   Anemia in other chronic diseases classified elsewhere   Sacral decubitus ulcer, stage IV (HCC)   CKD (chronic kidney disease), stage IV (HCC)   Dehydration   Hypotension secondary to moderate to severe dehydration - Admit the patient to the hospital for IV hydration.  I do not suspect any signs of active infection at this time.  Sacral decubitus ulcer does not appear infected either.  Hold off on antibiotics.  Blood pressure improved with IV fluids, gentle hydration overnight.  Monitor urine output.  Hold antihypertensive medications  Abdominal discomfort/nonbloody vomiting/nonbloody diarrhea -This is self resolved.  Currently her abdomen exam is benign.  Hold off on further work-up at this time.  Oral thrush - Nystatin swish and swallow given  Failure to thrive with poor oral intake - Supportive care, IV fluid.  Encourage p.o. intake.  This is secondary to advanced age.  Stage IV sacral decubitus ulcer -No obvious evidence of active infection.  Will consult wound care team for routine dressing.  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  Class II - Appears to be dehydrated likely secondary to Lasix.  We  will hold off on Lasix along with other antihypertensive medications due to soft blood pressure.  Will resume as appropriate.  Would recommend discharging her home on as needed Lasix as she is at a very high risk of recurrent dehydration.  Anemia of chronic disease -Hemoglobin 7.2, baseline is around 7.5.  Will transfuse for symptomatic management if it drops below 7.0.  No active signs of bleeding.  Paroxysmal atrial fibrillation -On amiodarone, continue this.  Not on long-term anticoagulation.  DVT prophylaxis: Lovenox Code Status: DNR, goldenrod form at bedside Family Communication: None at bedside Disposition Plan: We will transition her back to independent living facility with hospice Consults called: Wound care team Admission status: Admit for overnight observation and IV fluids.   Time Spent: 65 minutes.  >50% of the time was devoted to discussing the patients care, assessment, plan and disposition with other care givers along with counseling the patient about the risks and benefits of treatment.    Storey Stangeland Arsenio Loader MD [facility]  If 7PM-7AM, please contact night-coverage [email or web address]  01/13/2019, 5:41 PM

## 2019-01-13 NOTE — ED Notes (Signed)
Bed: EA30 Expected date:  Expected time:  Means of arrival:  Comments: 85 yo N/V/D

## 2019-01-13 NOTE — ED Provider Notes (Signed)
[place] DEPT Provider Note   CSN: [id number] Arrival date & time: 01/13/19  1251    History   Chief Complaint Chief Complaint  Patient presents with  . Nausea  . Emesis  . Diarrhea    HPI [name] is a 85 y.o. female.     HPI Patient is an 85 year old female who is brought to the emergency department for nausea vomiting and diarrhea over the past 48 hours.  She comes from her assisted living center.  Facility was concerned about dehydration and inability to keep fluids down.  Patient was hypotensive for EMS with a blood pressure of 85/50.  She also has a large known sacral decubitus ulcer.  Patient reports her last episode of vomiting was in the middle the night.  She is asking to drink something at this time.  IV fluids initiated by EMS.  Denies abdominal pain.  No chest pain or shortness of breath.   Past Medical History:  Diagnosis Date  . Arthritis    severe  . Atrial fibrillation ([place])   . Chronic diastolic CHF (congestive heart failure) ([place])   . Chronic insomnia   . Degenerative arthritis   . Essential hypertension   . Hypercholesteremia   . Memory deficit 05/24/2013  . Osteoporosis   . PAF (paroxysmal atrial fibrillation) ([place])    a. Intolerant to amiodarone, discontinued October 2012.  Marland Kitchen RLS (restless legs syndrome) 08/11/2016  . Sinus bradycardia   . Stroke [facility]) 2007   mini stroke or TIA with aphasia     Patient Active Problem List   Diagnosis Date Noted  . Anemia in other chronic diseases classified elsewhere 12/21/2018  . Severe anemia 12/21/2018  . Atypical atrial flutter ([place])   . CHF (congestive heart failure) ([place]) 04/06/2018  . Hyponatremia 04/06/2018  . Acute respiratory failure ([place]) 04/06/2018  . Normocytic normochromic anemia 04/06/2018  . RLS (restless legs syndrome) 08/11/2016  . Demand ischemia ([place]) 05/27/2016  . Acute on chronic diastolic congestive heart failure ([place]) 05/26/2016  . Atrial  fibrillation with RVR ([place]) 05/26/2016  . Insomnia 01/22/2016  . Sinus bradycardia   . Chronic diastolic CHF (congestive heart failure) ([place])   . Essential hypertension   . PAF (paroxysmal atrial fibrillation) ([place])   . Stroke ([place])   . Memory deficit 05/24/2013  . Arthritis 10/13/2011  . Long term (current) use of anticoagulants 07/21/2011  . Dyslipidemia 07/10/2011    Past Surgical History:  Procedure Laterality Date  . CATARACT EXTRACTION  2009   ou  . FEMUR FRACTURE SURGERY  8/12  . left knee  2001  . REPLACEMENT TOTAL KNEE BILATERAL Right 1999  . right intertrochanteric hip fracture status post ORIF  August 2012  . right shoulder replacement  2009     OB History   No obstetric history on file.      Home Medications    Prior to Admission medications   Medication Sig Start Date End Date Taking? Authorizing Provider  acetaminophen (TYLENOL) 500 MG tablet Take 500 mg by mouth 2 (two) times daily as needed for mild pain.   Yes [provider]  amiodarone (PACERONE) 400 MG tablet Take 400 mg by mouth daily.   Yes [provider]  BACITRAYCIN PLUS 500 UNIT/GM ointment Apply 1 application topically daily.  12/28/18  Yes [provider]  clotrimazole (MYCELEX) 10 MG troche Take 1 tablet (10 mg total) by mouth 5 (five) times daily. 01/08/19  Yes Charlesetta Shanks, MD  diclofenac sodium (VOLTAREN) 1 % GEL Apply 2 g topically 2 (two) times daily.    Yes [provider]  Eszopiclone 3 MG TABS Take 3 mg by mouth at bedtime as needed (sleep). Take immediately before bedtime    Yes [provider]  fentaNYL (DURAGESIC) 50 MCG/HR Place 1 patch onto the skin every 3 (three) days.   Yes [provider]  furosemide (LASIX) 40 MG tablet Take 1 tablet (40 mg total) by mouth every other day. Patient taking differently: Take 40 mg by mouth daily.  08/17/18  Yes Burtis Junes, NP  isosorbide mononitrate (IMDUR) 30 MG 24 hr tablet Take 1  tablet (30 mg total) by mouth daily. 05/23/18  Yes Burtis Junes, NP  LORazepam (ATIVAN) 0.5 MG tablet Take 0.25 mg by mouth at bedtime as needed for anxiety.  07/20/18  Yes [provider]  metoprolol succinate (TOPROL-XL) 25 MG 24 hr tablet Take 0.5 tablets (12.5 mg total) by mouth daily. Patient taking differently: Take 25 mg by mouth daily.  08/17/18  Yes Burtis Junes, NP  Multiple Vitamins-Minerals (ICAPS AREDS 2 PO) Take 1 capsule by mouth 2 (two) times daily.   Yes [provider]  nitroGLYCERIN (NITROSTAT) 0.4 MG SL tablet DISSOLVE 1 TABLET UNDER THE TONGUE EVERY 5 MINUTES AS NEEDED FOR CHEST PAIN Patient taking differently: Place 0.4 mg under the tongue every 5 (five) minutes as needed.  03/14/18  Yes Burtis Junes, NP  ondansetron (ZOFRAN) 8 MG tablet Take 8 mg by mouth 2 (two) times daily as needed for nausea or vomiting.   Yes [provider]  amiodarone (PACERONE) 200 MG tablet Take 1 tablet (200 mg total) by mouth daily. Patient not taking: Reported on 01/13/2019 08/17/18   Burtis Junes, NP  amoxicillin-clavulanate (AUGMENTIN) 500-125 MG tablet Take 1 tablet (500 mg total) by mouth 2 (two) times daily. Patient not taking: Reported on 01/07/2019 12/22/18   Caren Griffins, MD  collagenase (SANTYL) ointment Apply topically daily. Patient not taking: Reported on 01/07/2019 12/22/18   Caren Griffins, MD  hydrOXYzine (ATARAX/VISTARIL) 10 MG tablet Take 1 tablet (10 mg total) by mouth 3 (three) times daily as needed for anxiety. Patient not taking: Reported on 01/07/2019 04/11/18   Donne Hazel, MD  Melatonin 3 MG TABS Take 3 tablets (9 mg total) by mouth at bedtime as needed (for sleep). Patient not taking: Reported on 01/07/2019 04/11/18   Donne Hazel, MD  pantoprazole (PROTONIX) 40 MG tablet Take 1 tablet (40 mg total) by mouth daily. Patient not taking: Reported on 01/07/2019 12/22/18 Mar 09, 2019  Caren Griffins, MD  traMADol (ULTRAM) 50 MG tablet Take  2 tablets (100 mg total) by mouth every 6 (six) hours as needed for severe pain. Patient not taking: Reported on 01/07/2019 04/11/18   Donne Hazel, MD    Family History Family History  Problem Relation Age of Onset  . Dementia Mother   . Stroke Mother   . Cancer - Colon Father   . Cancer Father   . Cancer Sister   . Cancer Son   . Cancer Sister   . Coronary artery disease Other     Social History Social History   Tobacco Use  . Smoking status: Never Smoker  . Smokeless tobacco: Never Used  Substance Use Topics  . Alcohol use: Yes    Alcohol/week: 7.0 standard drinks    Types: 7 Glasses of wine per week  Comment: 1 glass wine with dinner some days  . Drug use: No     Allergies   Codeine   Review of Systems Review of Systems  All other systems reviewed and are negative.    Physical Exam Updated Vital Signs BP (!) 95/48   Pulse 74   Temp 97.9 F (36.6 C) (Rectal)   Resp (!) 22   Ht 4\' 10"  (1.473 m)   Wt 44.5 kg   SpO2 99%   BMI 20.48 kg/m   Physical Exam Vitals signs and nursing note reviewed.  Constitutional:      General: She is not in acute distress.    Appearance: She is well-developed.  HENT:     Head: Normocephalic and atraumatic.  Neck:     Musculoskeletal: Normal range of motion.  Cardiovascular:     Rate and Rhythm: Normal rate and regular rhythm.     Heart sounds: Normal heart sounds.  Pulmonary:     Effort: Pulmonary effort is normal.     Breath sounds: Normal breath sounds.  Abdominal:     General: There is no distension.     Palpations: Abdomen is soft.     Tenderness: There is no abdominal tenderness.  Musculoskeletal: Normal range of motion.  Skin:    General: Skin is warm and dry.  Neurological:     Mental Status: She is alert and oriented to person, place, and time.  Psychiatric:        Judgment: Judgment normal.      ED Treatments / Results  Labs (all labs ordered are listed, but only abnormal results are  displayed) Labs Reviewed  CBC - Abnormal; Notable for the following components:      Result Value   RBC 3.12 (*)    Hemoglobin 7.2 (*)    HCT 24.4 (*)    MCV 78.2 (*)    MCH 23.1 (*)    MCHC 29.5 (*)    RDW 24.9 (*)    All other components within normal limits  COMPREHENSIVE METABOLIC PANEL - Abnormal; Notable for the following components:   BUN 94 (*)    Creatinine, Ser 2.03 (*)    Calcium 8.6 (*)    Total Protein 5.5 (*)    Albumin 2.3 (*)    GFR calc non Af Amer 21 (*)    GFR calc Af Amer 25 (*)    All other components within normal limits  URINALYSIS, ROUTINE W REFLEX MICROSCOPIC    EKG None  Radiology No results found.  Procedures Procedures (including critical care time)  Medications Ordered in ED Medications  ondansetron (ZOFRAN) injection 4 mg (4 mg Intravenous Given 01/13/19 1431)  sodium chloride 0.9 % bolus 1,000 mL (1,000 mLs Intravenous New Bag/Given 01/13/19 1432)     Initial Impression / Assessment and Plan / ED Course  I have reviewed the triage vital signs and the nursing notes.  Pertinent labs & imaging results that were available during my care of the patient were reviewed by me and considered in my medical decision making (see chart for details).        BUN  Date Value Ref Range Status  01/13/2019 94 (H) 8 - 23 mg/dL Final  12/21/2018 83 (H) 8 - 23 mg/dL Final  05/23/2018 27 8 - 27 mg/dL Final  04/11/2018 34 (H) 6 - 20 mg/dL Final  04/10/2018 25 (H) 6 - 20 mg/dL Final  10/13/2017 33 (H) 8 - 27 mg/dL Final  04/21/2017 26 8 -  27 mg/dL Final  12/22/2016 27 8 - 27 mg/dL Final   Creat  Date Value Ref Range Status  06/29/2016 1.06 (H) 0.60 - 0.88 mg/dL Final    Comment:      For patients > or = 83 years of age: The upper reference limit for Creatinine is approximately 13% higher for people identified as African-American.     01/25/2015 0.82 0.50 - 1.10 mg/dL Final   Creatinine, Ser  Date Value Ref Range Status  01/13/2019 2.03 (H)  0.44 - 1.00 mg/dL Final  12/21/2018 2.22 (H) 0.44 - 1.00 mg/dL Final  05/23/2018 1.04 (H) 0.57 - 1.00 mg/dL Final  04/11/2018 1.14 (H) 0.44 - 1.00 mg/dL Final    Based on labs from June and July 2019 the patient appears to have acute kidney injury.  Her last creatinine obtained in this health system was on December 21, 2018 at which point it was 2.2 but it was never rechecked after hospitalization.  I suspect this is nausea vomiting diarrhea with associated acute kidney injury.  Patient will need admission the hospital for ongoing IV hydration and recheck of her kidney function.  Repeat abdominal exam without focal tenderness.  I do not think she needs advanced imaging at this time.  Urinalysis pending.  Blood pressure improving with IV fluids.  A second liter of fluids were given now.  Hospitalist admission.  Final Clinical Impressions(s) / ED Diagnoses   Final diagnoses:  None    ED Discharge Orders    None       Jola Schmidt, MD 01/13/19 1640

## 2019-01-13 NOTE — ED Notes (Signed)
ED TO INPATIENT HANDOFF REPORT  ED Nurse Name and Phone #: Raquel Sarna, RN  S Name/Age/Gender [name] 85 y.o. female Room/Bed: WA19/WA19  Code Status   Code Status: DNR  Home/SNF/Other Home Patient oriented to: self Is this baseline? Yes   Triage Complete: Triage complete  Chief Complaint N/V/D  Triage Note Patient BIB [facility] from [place] for n/v/d x2days. Facility concerned about dehydration, not keeping down fluids. Hypotensive on EMS arrival 85/50. Pt also has multiple pressure ulcers noted by EMS. Pt given 250 ml bolus with improved BP of 133/70. Pt did not want to come but facility insisted. No vomiting with EMS, pt states she feels better and hasnt vomited since middle of the night.    Allergies Allergies  Allergen Reactions  . Codeine Nausea Only    Level of Care/Admitting Diagnosis ED Disposition    ED Disposition Condition [facility] Area: [place] [[id number]]  Level of Care: Telemetry [5]  Admit to tele based on following criteria: Monitor QTC interval  Admit to tele based on following criteria: Complex arrhythmia (Bradycardia/Tachycardia)  Diagnosis: Hypotension [383291]  Admitting Physician: Gerlean Ren [facility] [[id number]]  Attending Physician: Gerlean Ren [facility] [[id number]]  PT Class (Do Not Modify): Observation [104]  PT Acc Code (Do Not Modify): Observation [10022]       B Medical/Surgery History Past Medical History:  Diagnosis Date  . Arthritis    severe  . Atrial fibrillation ([place])   . Chronic diastolic CHF (congestive heart failure) ([place])   . Chronic insomnia   . Degenerative arthritis   . Essential hypertension   . Hypercholesteremia   . Memory deficit 05/24/2013  . Osteoporosis   . PAF (paroxysmal atrial fibrillation) ([place])    a. Intolerant to amiodarone, discontinued October 2012.  Marland Kitchen RLS (restless legs syndrome) 08/11/2016  . Sinus bradycardia   . Stroke [facility]) 2007   mini stroke or TIA with aphasia     Past Surgical History:  Procedure Laterality Date  . CATARACT EXTRACTION  2009   ou  . FEMUR FRACTURE SURGERY  8/12  . left knee  2001  . REPLACEMENT TOTAL KNEE BILATERAL Right 1999  . right intertrochanteric hip fracture status post ORIF  August 2012  . right shoulder replacement  2009     A IV Location/Drains/Wounds Patient Lines/Drains/Airways Status   Active Line/Drains/Airways    Name:   Placement date:   Placement time:   Site:   Days:   Peripheral IV 01/13/19 Left Antecubital   01/13/19    1303    Antecubital   less than 1   Peripheral IV 01/13/19 Right;Upper Forearm   01/13/19    1429    Forearm   less than 1   External Urinary Catheter   12/21/18    1940    -   23   Pressure Injury 12/21/18 Unstageable - Full thickness tissue loss in which the base of the ulcer is covered by slough (yellow, tan, gray, green or brown) and/or eschar (tan, brown or black) in the wound bed.   12/21/18    1942     23   Pressure Injury 12/21/18 Stage I -  Intact skin with non-blanchable redness of a localized area usually over a bony prominence.   12/21/18    1946     23   Pressure Injury 12/21/18 Unstageable - Full thickness tissue loss in which the base of the ulcer is covered by slough (yellow,  tan, gray, green or brown) and/or eschar (tan, brown or black) in the wound bed.   12/21/18    1947     23   Wound / Incision (Open or Dehisced) 12/21/18   12/21/18    1900    -   23          Intake/Output Last 24 hours  Intake/Output Summary (Last 24 hours) at 01/13/2019 1841 Last data filed at 01/13/2019 1801 Gross per 24 hour  Intake 2272.31 ml  Output -  Net 2272.31 ml    Labs/Imaging Results for orders placed or performed during the hospital encounter of 01/13/19 (from the past 48 hour(s))  CBC     Status: Abnormal   Collection Time: 01/13/19  2:30 PM  Result Value Ref Range   WBC 6.1 4.0 - 10.5 K/uL   RBC 3.12 (L) 3.87 - 5.11 MIL/uL   Hemoglobin 7.2 (L) 12.0 - 15.0 g/dL   HCT 24.4  (L) 36.0 - 46.0 %   MCV 78.2 (L) 80.0 - 100.0 fL   MCH 23.1 (L) 26.0 - 34.0 pg   MCHC 29.5 (L) 30.0 - 36.0 g/dL   RDW 24.9 (H) 11.5 - 15.5 %   Platelets 269 150 - 400 K/uL   nRBC 0.0 0.0 - 0.2 %    Comment: Performed at [facility], [place] [address] [place], [place] [address]  Comprehensive metabolic panel     Status: Abnormal   Collection Time: 01/13/19  2:30 PM  Result Value Ref Range   Sodium 136 135 - 145 mmol/L   Potassium 4.4 3.5 - 5.1 mmol/L   Chloride 100 98 - 111 mmol/L   CO2 26 22 - 32 mmol/L   Glucose, Bld 90 70 - 99 mg/dL   BUN 94 (H) 8 - 23 mg/dL   Creatinine, Ser 2.03 (H) 0.44 - 1.00 mg/dL   Calcium 8.6 (L) 8.9 - 10.3 mg/dL   Total Protein 5.5 (L) 6.5 - 8.1 g/dL   Albumin 2.3 (L) 3.5 - 5.0 g/dL   AST 33 15 - 41 U/L   ALT 35 0 - 44 U/L   Alkaline Phosphatase 93 38 - 126 U/L   Total Bilirubin 0.6 0.3 - 1.2 mg/dL   GFR calc non Af Amer 21 (L) >60 mL/min   GFR calc Af Amer 25 (L) >60 mL/min   Anion gap 10 5 - 15    Comment: Performed at [facility], [place] [address], [place], [place] [address]  Urinalysis, Routine w reflex microscopic     Status: Abnormal   Collection Time: 01/13/19  4:38 PM  Result Value Ref Range   Color, Urine YELLOW YELLOW   APPearance CLEAR CLEAR   Specific Gravity, Urine 1.014 1.005 - 1.030   pH 5.0 5.0 - 8.0   Glucose, UA NEGATIVE NEGATIVE mg/dL   Hgb urine dipstick NEGATIVE NEGATIVE   Bilirubin Urine NEGATIVE NEGATIVE   Ketones, ur NEGATIVE NEGATIVE mg/dL   Protein, ur NEGATIVE NEGATIVE mg/dL   Nitrite POSITIVE (A) NEGATIVE   Leukocytes,Ua TRACE (A) NEGATIVE   WBC, UA 0-5 0 - 5 WBC/hpf   Bacteria, UA RARE (A) NONE SEEN   Squamous Epithelial / LPF 0-5 0 - 5    Comment: Performed at [facility], [place] [address] [place], [place] [address]   No results found.  Pending Labs [employer] (From admission, onward)    Start     Ordered   01/20/19 0500  Creatinine, serum   (enoxaparin (LOVENOX)    CrCl >/= 30 ml/min)  Weekly,   R    Comments:  while on enoxaparin therapy    01/13/19 1727   01/14/19 0500  Magnesium  Tomorrow morning,   R     01/13/19 1727   01/14/19 0500  Comprehensive metabolic panel  Tomorrow morning,   R     01/13/19 1727   01/14/19 0500  CBC  Tomorrow morning,   R     01/13/19 1727   01/14/19 0500  Protime-INR  Tomorrow morning,   R     01/13/19 1727   01/14/19 0500  APTT  Tomorrow morning,   R     01/13/19 1727   01/13/19 1725  CBC  (enoxaparin (LOVENOX)    CrCl >/= 30 ml/min)  Once,   R    Comments:  Baseline for enoxaparin therapy IF NOT ALREADY DRAWN.  Notify MD if PLT < 100 K.    01/13/19 1727   01/13/19 1725  Creatinine, serum  (enoxaparin (LOVENOX)    CrCl >/= 30 ml/min)  Once,   R    Comments:  Baseline for enoxaparin therapy IF NOT ALREADY DRAWN.    01/13/19 1727          Vitals/Pain Today's Vitals   01/13/19 1615 01/13/19 1630 01/13/19 1800 01/13/19 1830  BP:  (!) 95/48 (!) 130/102 (!) 100/51  Pulse: 70 74 72 64  Resp: (!) 21 (!) 22 17 13   Temp:      TempSrc:      SpO2: 93% 99% 100% 100%  Weight:      Height:      PainSc:        Isolation Precautions No active isolations  Medications Medications  amiodarone (PACERONE) tablet 400 mg (has no administration in time range)  ondansetron (ZOFRAN) tablet 8 mg (has no administration in time range)  enoxaparin (LOVENOX) injection 40 mg (has no administration in time range)  0.9 %  sodium chloride infusion (has no administration in time range)  acetaminophen (TYLENOL) tablet 650 mg (has no administration in time range)    Or  acetaminophen (TYLENOL) suppository 650 mg (has no administration in time range)  HYDROcodone-acetaminophen (NORCO/VICODIN) 5-325 MG per tablet 1-2 tablet (has no administration in time range)  bisacodyl (DULCOLAX) suppository 10 mg (has no administration in time range)  polyethylene glycol (MIRALAX / GLYCOLAX) packet 17 g (has no  administration in time range)  nystatin (MYCOSTATIN) 100000 UNIT/ML suspension 500,000 Units (has no administration in time range)  ondansetron (ZOFRAN) injection 4 mg (4 mg Intravenous Given 01/13/19 1431)  sodium chloride 0.9 % bolus 1,000 mL ( Intravenous Stopped 01/13/19 1642)  sodium chloride 0.9 % bolus 1,000 mL ( Intravenous Stopped 01/13/19 1759)    Mobility non-ambulatory High fall risk   Focused Assessments Cardiac Assessment Handoff:  Cardiac Rhythm: Normal sinus rhythm Lab Results  Component Value Date   CKTOTAL 68 09/18/2011   CKMB 3.1 09/18/2011   TROPONINI 0.03 (HH) 04/06/2018   Lab Results  Component Value Date   DDIMER 3.80 (H) 08/16/2011   Does the Patient currently have chest pain? No     GI   R Recommendations: See Admitting Provider Note  Report given to: Gregary Signs, RN  Additional Notes: none

## 2019-01-13 NOTE — Progress Notes (Signed)
[facility]) Hospice  Pt is under hospice services at ALF.  She is exceeding the care that can be provided at her current location and needs SNF services.  [facility] MD spoke with her PCP and the decision was made to send her to the hospital for management of her wounds.  [facility] will continue to follow her while she is in the hospital.  [facility] LCSW had already had conversation with pt about placement earlier in the week.  Pt has been at [facility], [facility] and [facility] in the past and was agreeable to return to one of these.  Please call with any questions or concerns.  Venia Carbon RN, BSN, [facility] Liaison [phone]

## 2019-01-13 NOTE — ED Notes (Signed)
1st attempt to call report made, RN in a room giving meds and will call back in just a few minutes.

## 2019-01-13 NOTE — ED Triage Notes (Signed)
Patient BIB [facility] from [place] for n/v/d x2days. Facility concerned about dehydration, not keeping down fluids. Hypotensive on EMS arrival 85/50. Pt also has multiple pressure ulcers noted by EMS. Pt given 250 ml bolus with improved BP of 133/70. Pt did not want to come but facility insisted. No vomiting with EMS, pt states she feels better and hasnt vomited since middle of the night.

## 2019-01-14 DIAGNOSIS — E785 Hyperlipidemia, unspecified: Secondary | ICD-10-CM

## 2019-01-14 DIAGNOSIS — I5032 Chronic diastolic (congestive) heart failure: Secondary | ICD-10-CM | POA: Diagnosis not present

## 2019-01-14 DIAGNOSIS — S32000S Wedge compression fracture of unspecified lumbar vertebra, sequela: Secondary | ICD-10-CM | POA: Diagnosis not present

## 2019-01-14 DIAGNOSIS — G2581 Restless legs syndrome: Secondary | ICD-10-CM

## 2019-01-14 DIAGNOSIS — N184 Chronic kidney disease, stage 4 (severe): Secondary | ICD-10-CM

## 2019-01-14 DIAGNOSIS — L89610 Pressure ulcer of right heel, unstageable: Secondary | ICD-10-CM | POA: Diagnosis not present

## 2019-01-14 DIAGNOSIS — I9589 Other hypotension: Secondary | ICD-10-CM | POA: Diagnosis not present

## 2019-01-14 DIAGNOSIS — E860 Dehydration: Secondary | ICD-10-CM | POA: Diagnosis not present

## 2019-01-14 DIAGNOSIS — I4891 Unspecified atrial fibrillation: Secondary | ICD-10-CM | POA: Diagnosis not present

## 2019-01-14 DIAGNOSIS — E861 Hypovolemia: Secondary | ICD-10-CM

## 2019-01-14 DIAGNOSIS — D638 Anemia in other chronic diseases classified elsewhere: Secondary | ICD-10-CM | POA: Diagnosis not present

## 2019-01-14 DIAGNOSIS — I480 Paroxysmal atrial fibrillation: Secondary | ICD-10-CM

## 2019-01-14 DIAGNOSIS — M069 Rheumatoid arthritis, unspecified: Secondary | ICD-10-CM | POA: Diagnosis not present

## 2019-01-14 DIAGNOSIS — L89154 Pressure ulcer of sacral region, stage 4: Secondary | ICD-10-CM

## 2019-01-14 DIAGNOSIS — F0390 Unspecified dementia without behavioral disturbance: Secondary | ICD-10-CM | POA: Diagnosis not present

## 2019-01-14 DIAGNOSIS — I5040 Unspecified combined systolic (congestive) and diastolic (congestive) heart failure: Secondary | ICD-10-CM | POA: Diagnosis not present

## 2019-01-14 LAB — COMPREHENSIVE METABOLIC PANEL
ALT: 33 U/L (ref 0–44)
ANION GAP: 6 (ref 5–15)
AST: 29 U/L (ref 15–41)
Albumin: 2 g/dL — ABNORMAL LOW (ref 3.5–5.0)
Alkaline Phosphatase: 83 U/L (ref 38–126)
BUN: 82 mg/dL — ABNORMAL HIGH (ref 8–23)
CO2: 25 mmol/L (ref 22–32)
Calcium: 8.5 mg/dL — ABNORMAL LOW (ref 8.9–10.3)
Chloride: 108 mmol/L (ref 98–111)
Creatinine, Ser: 1.89 mg/dL — ABNORMAL HIGH (ref 0.44–1.00)
GFR calc non Af Amer: 23 mL/min — ABNORMAL LOW (ref >60)
GFR, EST AFRICAN AMERICAN: 27 mL/min — AB (ref >60)
Glucose, Bld: 89 mg/dL (ref 70–99)
Potassium: 4.5 mmol/L (ref 3.5–5.1)
Sodium: 139 mmol/L (ref 135–145)
Total Bilirubin: 0.4 mg/dL (ref 0.3–1.2)
Total Protein: 4.9 g/dL — ABNORMAL LOW (ref 6.5–8.1)

## 2019-01-14 LAB — CBC
HCT: 24.4 % — ABNORMAL LOW (ref 36.0–46.0)
Hemoglobin: 6.9 g/dL — CL (ref 12.0–15.0)
MCH: 23 pg — ABNORMAL LOW (ref 26.0–34.0)
MCHC: 28.3 g/dL — ABNORMAL LOW (ref 30.0–36.0)
MCV: 81.3 fL (ref 80.0–100.0)
Platelets: 266 10*3/uL (ref 150–400)
RBC: 3 MIL/uL — AB (ref 3.87–5.11)
RDW: 24.7 % — ABNORMAL HIGH (ref 11.5–15.5)
WBC: 6.1 10*3/uL (ref 4.0–10.5)
nRBC: 0 % (ref 0.0–0.2)

## 2019-01-14 LAB — PROTIME-INR
INR: 1.1 (ref 0.8–1.2)
PROTHROMBIN TIME: 14.4 s (ref 11.4–15.2)

## 2019-01-14 LAB — MAGNESIUM: Magnesium: 2.2 mg/dL (ref 1.7–2.4)

## 2019-01-14 LAB — PREPARE RBC (CROSSMATCH)

## 2019-01-14 LAB — APTT: aPTT: 48 seconds — ABNORMAL HIGH (ref 24–36)

## 2019-01-14 MED ORDER — SODIUM CHLORIDE 0.9% IV SOLUTION
Freq: Once | INTRAVENOUS | Status: AC
  Administered 2019-01-14: 21:00:00 via INTRAVENOUS

## 2019-01-14 MED ORDER — FUROSEMIDE 10 MG/ML IJ SOLN
20.0000 mg | Freq: Once | INTRAMUSCULAR | Status: AC
  Administered 2019-01-14: 20 mg via INTRAVENOUS
  Filled 2019-01-14: qty 2

## 2019-01-14 MED ORDER — LIP MEDEX EX OINT
TOPICAL_OINTMENT | CUTANEOUS | Status: AC
  Administered 2019-01-14: 18:00:00
  Filled 2019-01-14: qty 7

## 2019-01-14 MED ORDER — MELATONIN 3 MG PO TABS
9.0000 mg | ORAL_TABLET | Freq: Every evening | ORAL | Status: DC | PRN
  Filled 2019-01-14: qty 3

## 2019-01-14 MED ORDER — LORAZEPAM 0.5 MG PO TABS
0.2500 mg | ORAL_TABLET | Freq: Every evening | ORAL | Status: DC | PRN
  Administered 2019-01-14 – 2019-01-17 (×2): 0.25 mg via ORAL
  Filled 2019-01-14 (×2): qty 1

## 2019-01-14 NOTE — Progress Notes (Signed)
[facility] 1442 -- [facility] ([facility]) GIP RN Note  This is a related and covered GIP admission of 01/13/2019 with [facility] diagnosis of combined systolic and diastolic heart failure per Dr. Tomasa Hosteller. Patient has an [place] DNR. Facility called EMS for transport to ED stating patient had had nausea, vomiting and diarrhea for 2 days and pt appeared weaker. Pt did not wish to come to ED but the facility insisted. Hospice was notified prior to transport to the ED.  Pt is admitted to the hospital for acute kidney injury and hypotension secondary to dehydration.  Current continuous medications include 0.9 % sodium chloride infusion at 75 cc/hr and is scheduled to receive 2 units PRBC this evening for Hgb 6.9.HYDROcodone-acetaminophen (NORCO/VICODIN) 5-325 MG per tablet 1-2 tablet every 4 hours as needed for pain (doses received 01/13/19 at 2007 2 tabs and 01/14/19 0541 2 tabs).  O2 at 2 lpm per [place] and [place] is draining clear yellow urine.   Current abnormal labwork: (01/13/19) BUN 94, creatinine 2.03, Calcium 8.6, Albumin 2.3, Total protein 5.5, GFR 21, Hgb 7.2 (baseline reported at 7.5), Hct 24.4. (01/14/19) BUN 82, creatinine 1.89, calcium 8.5, Albumin 2.0, total protein 4.9, GFR 23, RBC 3.00, Hgb 6.9, Hct 24.4.  Visited with patient in room. She is drowsy, denying pain at this time as she states she just received Vicodin. She states she has chronic pain. We discussed her living at [facility] and the concern for her needing additional care than what she has been receiving. She agrees that she needs additional care but does not wish to go to SNF. She states that she would like to go to [facility] if she cannot return to [facility]. We discussed if she thought she could do rehab but she feels she could not participate. She very much wants to be involved in her decision making and discharge planning and does not wish for [place] to contact her son.  Discussion with Roderic Palau, LCSW regarding above. [place] spoke with  facility that states she needs higher level of care than can be provided. Questioned if the possibility of 24 hour caregivers could provide the level of care that is needed as pt does not want to go to SNF. Social work to continue to explore options appropriate for discharge planning.   Goals of care and discharge planning: see notes above  Communication to [facility]G: pt wishes [place] to only speak with her, she states she is keeping her son updated.  Communication with [facility]: per clinical note.  Transfer summary and current [facility] medication list placed on shadow chart.  Please call for any hospice related questions or concerns.  Thank you, Margaretmary Eddy, RN, BSN [place] Liaison [phone]  [facility] are on [place].

## 2019-01-14 NOTE — Progress Notes (Signed)
CSW spoke to [place] at [facility] who stated any referrals need to be faxed to resident Care Coordinator RN Miss Johnnette Barrios can review this on Monday (catheter or ostemy)and an FL-2/referral can go to fax: [phone].  Pt had stated she wanted to D/C to [facility]F, but per notes suggestions made by other LCSW's involved indicate it was felt SNF placement would be more appropriate.  Pt desired to confirm D/C date with the provider.  CSW will continue to follow for D/C needs.  Alphonse Guild. Bassy Fetterly, LCSW, LCAS, CSI Transitions of Care Clinical Social Worker Care Coordination Department Ph: [phone]

## 2019-01-14 NOTE — Progress Notes (Signed)
PROGRESS NOTE  JIMMI SIDENER BTD:[id number] DOB: Dec 13, 1927 DOA: 01/13/2019 PCP: Lajean Manes, MD   LOS: 0 days   Brief narrative:  [name] is a 85 y.o. female with medical history significant of with past medical history of paroxysmal atrial fibrillation, diastolic CHF, insomnia, failure to thrive, stage IV sacral decubitus ulcer essential hypertension was sent to the hospital from her living facility for evaluation of nausea, vomiting and diarrhea.  Per patient, she tells me she had one episode of nausea yesterday evening with one episode of nonbloody vomiting and nonbloody diarrhea.  She appeared slightly weak this morning therefore she was brought to the ER for further evaluation.  She denies any fevers, chills and other complaints.  She was admitted here for symptomatic anemia and failure to thrive about a month ago. She has been followed by hospice team at the facility.  In the ER, patient was initially noted to have soft blood pressures responded to IV fluids.  Hemoglobin is more or less close to her baseline 7.2.   Subjective: No nausea or vomiting today.  Hemoglobin drop was noted at 6.9.  Patient denies any chills or rigors.  Assessment/Plan:  Principal Problem:   Hypotension Active Problems:   Dyslipidemia   Chronic diastolic CHF (congestive heart failure) (HCC)   PAF (paroxysmal atrial fibrillation) (HCC)   RLS (restless legs syndrome)   Anemia in other chronic diseases classified elsewhere   Sacral decubitus ulcer, stage IV (HCC)   CKD (chronic kidney disease), stage IV (HCC)   Dehydration  Hypotension secondary to moderate to severe dehydration -Blood pressure improved with IV fluids.  Abdominal discomfort/nonbloody vomiting/nonbloody diarrhea -This has self resolved.    Examination is benign.  Oral thrush - Nystatin swish and swallow given  Failure to thrive with poor oral intake - Supportive care  Stage IV sacral decubitus ulcer with severe pain. -No  obvious evidence of active infection.  Consult wound care.  Focus on analgesia.  Patient is on hospice care at assisted living facility.  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    [place] hospice status. Lasix on hold.  Patient received IV fluids.  Patient will receive 2 units of packed RBC today due to severe anemia.  We will give 1 dose of Lasix in between.    Anemia of chronic disease -0.9 from 7.2 today.  Will transfuse 2 units of packed RBC.  Spoke with the patient about it.  Paroxysmal atrial fibrillation -On amiodarone, continue this.  Not on long-term anticoagulation.  VTE Prophylaxis: SCD  Code Status: DNR  Family Communication: I spoke with the patient's son on the phone and updated him about the clinical condition of the patient.  Patient son stated that he is currently working on getting skilled nursing facility for her but he is okay for mom to be discharged back to assisted living facility.  Patient however is not happy about going to a skilled nursing facility.  I spoke with the social worker about the disposition.  Disposition Plan: Likely to assisted living facility in a.m.  Hospice to be resumed after discharge.   Consultants:  None  Procedures:  PRBC transfusion  Antibiotics: Anti-infectives (From admission, onward)   None       Objective: Vitals:   01/14/19 0539 01/14/19 1314  BP: (!) 106/41 (!) 116/50  Pulse: (!) 55 (!) 58  Resp: 20 16  Temp: 98.5 F (36.9 C) 98 F (36.7 C)  SpO2: 100%  Intake/Output Summary (Last 24 hours) at 01/14/2019 1354 Last data filed at 01/14/2019 1113 Gross per 24 hour  Intake 2686.44 ml  Output 102 ml  Net 2584.44 ml   Filed Weights   01/13/19 1326  Weight: 44.5 kg   Body mass index is 20.48 kg/m.   Physical Exam: GENERAL: Patient is alert awake and oriented. Not in obvious distress.  Thinly built, frail appearing HENT: No scleral icterus scleral pallor is  noted.. Pupils equally reactive to light. Oral mucosa is moist NECK: is supple, no palpable thyroid enlargement. CHEST: Clear to auscultation. No crackles or wheezes. Non tender on palpation. Diminished breath sounds bilaterally. CVS: S1 and S2 heard, no murmur. Regular rate and rhythm. No pericardial rub. ABDOMEN: Soft, non-tender, bowel sounds are present. No palpable hepato-splenomegaly. EXTREMITIES: No joint deformity.  Normal muscle tone. CNS: Cranial nerves are intact. No focal motor or sensory deficits. SKIN: Stage IV sacral decubitus ulceration without no gross infection.  Data Review: I have personally reviewed the following laboratory data and studies,  CBC: Recent Labs  Lab 01/13/19 1430 01/14/19 0354  WBC 6.1 6.1  HGB 7.2* 6.9*  HCT 24.4* 24.4*  MCV 78.2* 81.3  PLT 269 536   Basic Metabolic Panel: Recent Labs  Lab 01/13/19 1430 01/14/19 0354  NA 136 139  K 4.4 4.5  CL 100 108  CO2 26 25  GLUCOSE 90 89  BUN 94* 82*  CREATININE 2.03* 1.89*  CALCIUM 8.6* 8.5*  MG  --  2.2   Liver Function Tests: Recent Labs  Lab 01/13/19 1430 01/14/19 0354  AST 33 29  ALT 35 33  ALKPHOS 93 83  BILITOT 0.6 0.4  PROT 5.5* 4.9*  ALBUMIN 2.3* 2.0*   No results for input(s): LIPASE, AMYLASE in the last 168 hours. No results for input(s): AMMONIA in the last 168 hours. Cardiac Enzymes: No results for input(s): CKTOTAL, CKMB, CKMBINDEX, TROPONINI in the last 168 hours. BNP (last 3 results) Recent Labs    04/06/18 0240 12/21/18 1344  BNP 388.3* 307.7*    ProBNP (last 3 results) No results for input(s): PROBNP in the last 8760 hours.  CBG: No results for input(s): GLUCAP in the last 168 hours. Recent Results (from the past 240 hour(s))  MRSA PCR Screening     Status: None   Collection Time: 01/13/19 10:08 PM  Result Value Ref Range Status   MRSA by PCR NEGATIVE NEGATIVE Final    Comment:        The GeneXpert MRSA Assay (FDA approved for NASAL specimens only),  is one component of a comprehensive MRSA colonization surveillance program. It is not intended to diagnose MRSA infection nor to guide or monitor treatment for MRSA infections. Performed at [facility], [place] [address] [place], [place] [address]      Studies: No results found.  Scheduled Meds: . sodium chloride   Intravenous Once  . amiodarone  400 mg Oral Daily  . enoxaparin (LOVENOX) injection  30 mg Subcutaneous Q24H  . nystatin  5 mL Oral QID    Continuous Infusions: . sodium chloride 75 mL/hr at 01/14/19 1137     Flora Lipps, MD  [facility] 01/14/2019

## 2019-01-14 NOTE — Progress Notes (Signed)
Blood transfusion orders placed by MD. No order for type and screen seen. Per lab, pt needs an order for a type and screen, per protocol. Order placed. VWilliams,RN.

## 2019-01-14 NOTE — Progress Notes (Signed)
CSW received a call from Graylon Good from Hospice who was wondering if pt is appropriate and can afford private aide care while remaining at [facility], rather than go to Assisted Living and because pt states she cannot participate in PT at this time, per Traci.  4:20 PM Per pt she already lives at [place] in room 105 but that [facility] is telling her she needs a higher level of care once D/C'd.  2nd shift ED CSW will leave handoff for 1st shift ED CSW.   CSW will continue to follow for D/C needs.  Alphonse Guild. Amorita Vanrossum, LCSW, LCAS, CSI Transitions of Care Clinical Social Worker Care Coordination Department Ph: [phone]

## 2019-01-14 NOTE — Progress Notes (Signed)
CSW called pt back and counseled pt she is ready for D/C and asked what her preferences are for disposition.  Pt stated she is from [facility] and would have to return and states that is where thinks she needs to return.  Pt insistent on insuring [provider will not D/C her "in the middle of the night" to [facility] because:  1. They won't have someone to help me until after breakfast. 2. I just started a blood transfusion at apporx 3:30pm and it takes 3 hours so I can't D/C in the middle of the night". 3. I have no family in town..  Pt insistent CSW speak to provider about D/C date and update pt.  Of note:  Pt has a son who is supportive and involved, but refuses to let CSW speak to him.  CSW will continue to follow for D/C needs.  Alphonse Guild. Ashwini Jago, LCSW, LCAS, CSI Transitions of Care Clinical Social Worker Care Coordination Department Ph: [phone]

## 2019-01-14 NOTE — Progress Notes (Addendum)
CSW attempted to call the pt at: [phone] [facility]) but phone was not operable.'  CSW called and spoke to the pt using the pt's phone room and pt stated she did not even know why she is at the hospital as she was just experiencing symptoms like nausea, vomiting etc.   Pt states she is in the hospital but is not progressing just by being at [facility].  Pt states testing has been exhaustive but no one "knows what caused this".  CSW asked where pt would like to go and pt stated, "somewhere where people cared about me".  Pt refused to let the CSW contact family to be of assistance with them, stating, "I'm keeping my son up up to date".  Pt stated [facility] is an assisted living facility she is involved with but could not specify how due to pt's being attended to by RN's at the moment.  Pt asked the CSW to call back.  CSW will continue to follow for D/C needs.  Alphonse Guild. Kaspar Albornoz, LCSW, LCAS, CSI Transitions of Care Clinical Social Worker Care Coordination Department Ph: [phone]

## 2019-01-15 ENCOUNTER — Telehealth: Payer: Self-pay | Admitting: *Deleted

## 2019-01-15 DIAGNOSIS — I5040 Unspecified combined systolic (congestive) and diastolic (congestive) heart failure: Secondary | ICD-10-CM | POA: Diagnosis not present

## 2019-01-15 DIAGNOSIS — S32000S Wedge compression fracture of unspecified lumbar vertebra, sequela: Secondary | ICD-10-CM | POA: Diagnosis not present

## 2019-01-15 DIAGNOSIS — F0390 Unspecified dementia without behavioral disturbance: Secondary | ICD-10-CM | POA: Diagnosis not present

## 2019-01-15 DIAGNOSIS — I5032 Chronic diastolic (congestive) heart failure: Secondary | ICD-10-CM | POA: Diagnosis not present

## 2019-01-15 DIAGNOSIS — M069 Rheumatoid arthritis, unspecified: Secondary | ICD-10-CM | POA: Diagnosis not present

## 2019-01-15 DIAGNOSIS — I9589 Other hypotension: Secondary | ICD-10-CM | POA: Diagnosis not present

## 2019-01-15 DIAGNOSIS — D638 Anemia in other chronic diseases classified elsewhere: Secondary | ICD-10-CM | POA: Diagnosis not present

## 2019-01-15 DIAGNOSIS — E860 Dehydration: Secondary | ICD-10-CM | POA: Diagnosis not present

## 2019-01-15 DIAGNOSIS — L89610 Pressure ulcer of right heel, unstageable: Secondary | ICD-10-CM | POA: Diagnosis not present

## 2019-01-15 DIAGNOSIS — I4891 Unspecified atrial fibrillation: Secondary | ICD-10-CM | POA: Diagnosis not present

## 2019-01-15 LAB — BASIC METABOLIC PANEL
Anion gap: 8 (ref 5–15)
BUN: 70 mg/dL — ABNORMAL HIGH (ref 8–23)
CO2: 23 mmol/L (ref 22–32)
CREATININE: 1.68 mg/dL — AB (ref 0.44–1.00)
Calcium: 8.5 mg/dL — ABNORMAL LOW (ref 8.9–10.3)
Chloride: 106 mmol/L (ref 98–111)
GFR calc Af Amer: 31 mL/min — ABNORMAL LOW (ref >60)
GFR calc non Af Amer: 27 mL/min — ABNORMAL LOW (ref >60)
Glucose, Bld: 100 mg/dL — ABNORMAL HIGH (ref 70–99)
Potassium: 4.5 mmol/L (ref 3.5–5.1)
Sodium: 137 mmol/L (ref 135–145)

## 2019-01-15 LAB — CBC
HCT: 35.6 % — ABNORMAL LOW (ref 36.0–46.0)
HEMOGLOBIN: 10.9 g/dL — AB (ref 12.0–15.0)
MCH: 25.6 pg — ABNORMAL LOW (ref 26.0–34.0)
MCHC: 30.6 g/dL (ref 30.0–36.0)
MCV: 83.6 fL (ref 80.0–100.0)
Platelets: 252 10*3/uL (ref 150–400)
RBC: 4.26 MIL/uL (ref 3.87–5.11)
RDW: 22.7 % — ABNORMAL HIGH (ref 11.5–15.5)
WBC: 12.5 10*3/uL — ABNORMAL HIGH (ref 4.0–10.5)
nRBC: 0 % (ref 0.0–0.2)

## 2019-01-15 LAB — MAGNESIUM: Magnesium: 2.2 mg/dL (ref 1.7–2.4)

## 2019-01-15 MED ORDER — SALINE SPRAY 0.65 % NA SOLN
1.0000 | NASAL | Status: DC | PRN
  Administered 2019-01-16: 1 via NASAL
  Filled 2019-01-15: qty 44

## 2019-01-15 MED ORDER — ENSURE ENLIVE PO LIQD
237.0000 mL | Freq: Two times a day (BID) | ORAL | Status: DC
  Administered 2019-01-16 – 2019-01-17 (×3): 237 mL via ORAL

## 2019-01-15 MED ORDER — ADULT MULTIVITAMIN W/MINERALS CH
1.0000 | ORAL_TABLET | Freq: Every day | ORAL | Status: DC
  Administered 2019-01-15 – 2019-01-17 (×3): 1 via ORAL
  Filled 2019-01-15 (×3): qty 1

## 2019-01-15 MED ORDER — PRO-STAT SUGAR FREE PO LIQD
30.0000 mL | Freq: Every day | ORAL | Status: DC
  Administered 2019-01-15 – 2019-01-16 (×2): 30 mL via ORAL
  Filled 2019-01-15 (×2): qty 30

## 2019-01-15 MED ORDER — JUVEN PO PACK
1.0000 | PACK | Freq: Two times a day (BID) | ORAL | Status: DC
  Administered 2019-01-16 – 2019-01-17 (×2): 1 via ORAL
  Filled 2019-01-15 (×5): qty 1

## 2019-01-15 NOTE — Progress Notes (Signed)
PROGRESS NOTE  AILYN GLADD SNK:[id number] DOB: [birth year] DOA: 01/13/2019 PCP: Lajean Manes, MD   LOS: 0 days   Brief narrative:  [name] is a 85 y.o. female with medical history significant of with past medical history of paroxysmal atrial fibrillation, diastolic CHF, insomnia, failure to thrive, stage IV sacral decubitus ulcer essential hypertension was sent to the hospital from assisted living facility for evaluation of nausea, vomiting and diarrhea.  Per patient, she tells me she had one episode of nausea yesterday evening with one episode of nonbloody vomiting and nonbloody diarrhea.  She appeared slightly weak this morning therefore she was brought to the ER for further evaluation.  Of note, patient was admitted here for symptomatic anemia and failure to thrive about a month ago. She has been followed by hospice team at the facility. In the ER, patient was initially noted to have soft blood pressures which responded to IV fluids.  Hemoglobin was more or less close to her baseline 7.2.   Subjective:  Patient still complains of back pain especially on moving and dressing.  No report of nausea, vomiting, abdominal pain or diarrhea.  Assessment/Plan:  Principal Problem:   Hypotension Active Problems:   Dyslipidemia   Chronic diastolic CHF (congestive heart failure) (HCC)   PAF (paroxysmal atrial fibrillation) (HCC)   RLS (restless legs syndrome)   Anemia in other chronic diseases classified elsewhere   Sacral decubitus ulcer, stage IV (HCC)   CKD (chronic kidney disease), stage IV (HCC)   Dehydration  Hypotension secondary to moderate to severe dehydration -Resolved with IV fluids and blood transfusion.  Abdominal discomfort/nonbloody vomiting/nonbloody diarrhea -This has self resolved.    Examination is benign.  Oral thrush - Nystatin swish and swallow to continue  Failure to thrive with poor oral intake - Supportive care  Stage IV sacral decubitus ulcer with severe  pain. -No obvious evidence of active infection.  Continue wound care.  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    Previously on hospice status. Likely compensated.  Received 2 units of packed RBC.  Start the patient on low-dose Lasix  Anemia of chronic disease -Status post 2 units of packed RBC.    Paroxysmal atrial fibrillation -On amiodarone, continue this.  Not on long-term anticoagulation.  VTE Prophylaxis: SCD  Code Status: DNR  Family Communication: I had a prolonged discussion with the patient's son at bedside.  He wishes to discuss with Education officer, museum.  Disposition Plan: Patient is currently from assisted living facility with hospice.  I have been notified that the assisted living facility has not been able to take care of her all needs.  Discussion is underway with the social services regarding optimal disposition.  Consultants:  None  Procedures:  PRBC transfusion  Antibiotics: Anti-infectives (From admission, onward)   None     Objective: Vitals:   01/15/19 0512 01/15/19 0729  BP: 106/60 112/63  Pulse: (!) 58 (!) 59  Resp: 18 18  Temp: 98.6 F (37 C) 98.2 F (36.8 C)  SpO2: 99% 100%    Intake/Output Summary (Last 24 hours) at 01/15/2019 1141 Last data filed at 01/15/2019 0729 Gross per 24 hour  Intake 718 ml  Output 550 ml  Net 168 ml   Filed Weights   01/13/19 1326  Weight: 44.5 kg   Body mass index is 20.48 kg/m.   Physical Exam: General: Thinly built, frail appearing.  Alert awake and communicative.  HENT: Normocephalic, pupils equally reacting to light  and accommodation.  Mild pallor noted, oral mucosa is moist.  Chest:  Clear breath sounds.  Diminished breath sounds bilaterally. No crackles or wheezes.  CVS: S1 &S2 heard. No murmur.  Regular rate and rhythm. Abdomen: Soft, nontender, nondistended.  Bowel sounds are heard.  Liver is not palpable, no abdominal mass palpated Extremities: No  cyanosis, clubbing or edema.  Peripheral pulses are palpable. Psych: Alert, awake and oriented, normal mood CNS:  No cranial nerve deficits.  Moves all extremities Skin: stage IV sacral decubitus ulceration on presentation  Data Review: I have personally reviewed the following laboratory data and studies,  CBC: Recent Labs  Lab 01/13/19 1430 01/14/19 0354  WBC 6.1 6.1  HGB 7.2* 6.9*  HCT 24.4* 24.4*  MCV 78.2* 81.3  PLT 269 219   Basic Metabolic Panel: Recent Labs  Lab 01/13/19 1430 01/14/19 0354 01/15/19 1105  NA 136 139 137  K 4.4 4.5 4.5  CL 100 108 106  CO2 26 25 23   GLUCOSE 90 89 100*  BUN 94* 82* 70*  CREATININE 2.03* 1.89* 1.68*  CALCIUM 8.6* 8.5* 8.5*  MG  --  2.2 2.2   Liver Function Tests: Recent Labs  Lab 01/13/19 1430 01/14/19 0354  AST 33 29  ALT 35 33  ALKPHOS 93 83  BILITOT 0.6 0.4  PROT 5.5* 4.9*  ALBUMIN 2.3* 2.0*   No results for input(s): LIPASE, AMYLASE in the last 168 hours. No results for input(s): AMMONIA in the last 168 hours. Cardiac Enzymes: No results for input(s): CKTOTAL, CKMB, CKMBINDEX, TROPONINI in the last 168 hours. BNP (last 3 results) Recent Labs    04/06/18 0240 12/21/18 1344  BNP 388.3* 307.7*    ProBNP (last 3 results) No results for input(s): PROBNP in the last 8760 hours.  CBG: No results for input(s): GLUCAP in the last 168 hours. Recent Results (from the past 240 hour(s))  MRSA PCR Screening     Status: None   Collection Time: 01/13/19 10:08 PM  Result Value Ref Range Status   MRSA by PCR NEGATIVE NEGATIVE Final    Comment:        The GeneXpert MRSA Assay (FDA approved for NASAL specimens only), is one component of a comprehensive MRSA colonization surveillance program. It is not intended to diagnose MRSA infection nor to guide or monitor treatment for MRSA infections. Performed at [facility], [place] [address], [place], [place] [address]      Studies: No results found.   Scheduled Meds: . amiodarone  400 mg Oral Daily  . enoxaparin (LOVENOX) injection  30 mg Subcutaneous Q24H  . nystatin  5 mL Oral QID    Continuous Infusions:   Flora Lipps, MD  [facility] 01/15/2019

## 2019-01-15 NOTE — Progress Notes (Addendum)
WL 1442 -- [facility] GIP RN Note @ 1188  This is a related and covered GIP admission of 01/13/2019 with ACC diagnosis of combined systolic and diastolic heart failure per Dr. Tomasa Hosteller. Patient has an [name] DNR. Facility called EMS for transport to ED stating patient had had nausea, vomiting and diarrhea for 2 days and pt appeared weaker. Pt did not wish to come to ED but the facility insisted. Hospice was notified prior to transport to the ED.  Pt is admitted to the hospital for acute kidney injury and hypotension secondary to dehydration.  Pt has received HYDROcodone-acetaminophen (NORCO/VICODIN) 5-325 MG per tablet 1-2 tablets X 3 doses in last 24 hours, LORazepam (ATIVAN) tablet 0.25 mg X 1 dose, and ondansetron (ZOFRAN) tablet 8 mg x 2 doses. Pt is not receiving any continuous medications. Pt is now on room air. Purewick draining clear yellow urine.  Current abnormal labs: Glucose 100, BUN 70, Creatinine 1.68, Calcium 8.5, GFR 27, WBC 12.5, Hgb 10.9, Hct 35.6, MCH 25.6, RDW 22.7.  Met with patient, son [name] and daughter in law in room. Pt states she is feeling very rough today. She states she is having pain at the site of her sacral decubitus ulcer. When asked if she desired pain medication she states she wishes to wait to take any until her lunch arrives to which she tells me has been ordered. Tim, wishes to discuss transferring patient to a higher level of care. He shares that the hospital MD and hospice staff have stated she needs to be in SNF. Phoned Kathlee Nations, [place] and asked if she could please come and meet with patient and family to discuss needs.  Goals of care: as above. Pt emphasized today that she does not want to return to the hospital. She has had a good life and she wants to "let well enough alone".  Discharge Planning: SNF with hospice. Should patient require ambulance transport at time of discharge, please call our contracted provider [facility] at [phone].  Communication with  [facility]: as above.  Communication with [facility]: per clinical note.  Transfer summary and current ACC medication list remain on shadow chart.  Please call with any hospice related questions or concerns.  Thank you, Margaretmary Eddy, RN, BSN [facility] Liaison [phone]  [place] are on [place].

## 2019-01-15 NOTE — Progress Notes (Signed)
Initial Nutrition Assessment  DOCUMENTATION CODES:   Not applicable  INTERVENTION:  - Will order Ensure Enlive BID, each supplement provides 350 kcal and 20 grams of protein. - Will order 30 mL Prostat once/day, each supplement provides 100 kcal and 15 grams of protein. - Will order Juven BID, each packet provides 90 calories, 2.5 grams of protein and 14 grams of amino acids; supplement contains CaHMB, glutamine, and arginine, to promote wound healing - Will order daily multivitamin with minerals. - Continue to encourage PO intakes.    NUTRITION DIAGNOSIS:   Increased nutrient needs related to wound healing as evidenced by estimated needs.  GOAL:   Patient will meet greater than or equal to 90% of their needs  MONITOR:   PO intake, Supplement acceptance, Weight trends, Labs, Skin  REASON FOR ASSESSMENT:   Malnutrition Screening Tool  ASSESSMENT:   85 y.o. female with past medical history of atrial fibrillation, CHF, insomnia, FTT, stage IV sacral decubitus ulcer, and essential HTN. She was sent to the hospital from her living facility for evaluation of N/V/D. She is being followed by hospice at the living facility.  BMI indicates normal weight. Patient reports good appetite on average with no recent changes. She reports that symptoms started the night PTA and that since admission on 3/13 she has not had any N/V and that BMs are now more formed. Patient denies any chewing or swallowing issues (although did notice current dx of thrush). She has been eating most of the items on her tray over the past 2 days, per report.   Per MD note yesterday afternoon, patient will likely return to ALF at d/c and hospice services to resume there. Although there is also the possibility for SNF.   Per chart review, current weight is 98 lb and weight on 08/07/18 was 102 lb. This indicates 4 lb weight loss (4% body weight) in the past 5 months; not significant for time frame.   Medications  reviewed; 20 mg IV lasix x1 dose 3/14, 5 ml mycostatin QID. Labs reviewed; BUN: 70 mg/dl, creatinine: 1.68 mg/dl, Ca: 8.5 mg/dl.      NUTRITION - FOCUSED PHYSICAL EXAM:  Completed; no muscle and no fat wasting.   Diet Order:   Diet Order            Diet 2 gram sodium Room service appropriate? Yes; Fluid consistency: Thin  Diet effective now              EDUCATION NEEDS:   No education needs have been identified at this time  Skin:  Skin Assessment: Skin Integrity Issues: Skin Integrity Issues:: Stage IV, Unstageable Stage IV: sacrum Unstageable: full thickness R heel  Last BM:  3/14  Height:   Ht Readings from Last 1 Encounters:  01/13/19 4\' 10"  (1.473 m)    Weight:   Wt Readings from Last 1 Encounters:  01/13/19 44.5 kg    Ideal Body Weight:  42.18 kg  BMI:  Body mass index is 20.48 kg/m.  Estimated Nutritional Needs:   Kcal:  1560-1780 kcal  Protein:  67-80 grams  Fluid:  >/= 1.7 L/day      Jarome Matin, MS, RD, LDN, [facility] Inpatient Clinical Dietitian Pager # [phone] After hours/weekend pager # [phone]

## 2019-01-15 NOTE — TOC Initial Note (Signed)
Transition of Care [facility]) - Initial/Assessment Note    Patient Details  Name: [name] MRN: [id number] Date of Birth: Jun 17, 1928  [facility] CM/SW Contact:    Geralynn Ochs, LCSW Phone Number: 01/15/2019, 2:29 PM  Clinical Narrative:   Patient from [place], but is requiring a higher level of care at this time due to wounds that ALF cannot handle. CSW met with patient and son [name] at bedside, as well as hospice RN Olivia Mackie.  Patient indicates that she is also not happy at [facility] anymore as the nurses are mean to her now that she requires more care, and she feels that they are being disrespectful to her. Patient provided permission to discuss with patient's sons, as they have been assisting with trying to contact skilled facilities to find a bed for her. CSW provided CMS list for patient's son [name], and he requested that [place] email to both him and his brother. Tim requested that CSW send referral to [facility], [facility], and [facility]. CSW discussed with patient and son that if patient admits to SNF with hospice, then they will be responsible for private pay of the room and board, and hospice will cover everything else. Patient would prefer a private room, but CSW discussed that it may become a decision of what she would like to prioritize, the qualify of the facility or having a private room.               Expected Discharge Plan: Skilled Nursing Facility Barriers to Discharge: Continued Medical Work up, Hospice Bed not available   Patient Goals and CMS Choice Patient states their goals for this hospitalization and ongoing recovery are:: be comfortable and well cared for CMS Medicare.gov Compare Post Acute Care list provided to:: Patient Choice offered to / list presented to : Patient  Expected Discharge Plan and Services Expected Discharge Plan: [place] Choice: [place] arrangements for the  past 2 months: [place] Expected Discharge Date: (unknown)                        Prior Living Arrangements/Services Living arrangements for the past 2 months: [place] Lives with:: Self, Facility Resident Patient language and need for interpreter reviewed:: No Do you feel safe going back to the place where you live?: Yes      Need for Family Participation in Patient Care: No (Comment) Care giver support system in place?: No (comment)   Criminal Activity/Legal Involvement Pertinent to Current Situation/Hospitalization: No - Comment as needed  Activities of Daily Living Home Assistive Devices/Equipment: Environmental consultant (specify type), Eyeglasses ADL Screening (condition at time of admission) Patient's cognitive ability adequate to safely complete daily activities?: Yes Is the patient deaf or have difficulty hearing?: Yes Does the patient have difficulty seeing, even when wearing glasses/contacts?: No Does the patient have difficulty concentrating, remembering, or making decisions?: No Patient able to express need for assistance with ADLs?: Yes Does the patient have difficulty dressing or bathing?: No Independently performs ADLs?: No Communication: Independent Dressing (OT): Needs assistance Is this a change from baseline?: Pre-admission baseline Grooming: Needs assistance Is this a change from baseline?: Pre-admission baseline Feeding: Independent Bathing: Needs assistance Is this a change from baseline?: Pre-admission baseline Toileting: Needs assistance Is this a change from baseline?: Pre-admission baseline In/Out Bed: Needs assistance Is this a change from baseline?: Pre-admission baseline Walks in Home: Needs assistance Is this  a change from baseline?: Pre-admission baseline Does the patient have difficulty walking or climbing stairs?: Yes Weakness of Legs: Both Weakness of Arms/Hands: Both  Permission Sought/Granted Permission sought to share  information with : Customer service manager, Family Supports Permission granted to share information with : Yes, Verbal Permission Granted  Share Information with NAME: Jeanne Ivan  Permission granted to share info w AGENCY: SNF  Permission granted to share info w Relationship: Sons     Emotional Assessment Appearance:: Appears stated age Attitude/Demeanor/Rapport: Engaged Affect (typically observed): Pleasant Orientation: : Oriented to Self, Oriented to Place, Oriented to Situation, Oriented to  Time Alcohol / Substance Use: Not Applicable Psych Involvement: No (comment)  Admission diagnosis:  Dehydration [E86.0] Nausea vomiting and diarrhea [R11.2, R19.7] Patient Active Problem List   Diagnosis Date Noted  . Sacral decubitus ulcer, stage IV ([place]) 01/13/2019  . CKD (chronic kidney disease), stage IV ([place]) 01/13/2019  . Dehydration 01/13/2019  . Hypotension 01/13/2019  . Anemia in other chronic diseases classified elsewhere 12/21/2018  . Severe anemia 12/21/2018  . Atypical atrial flutter ([place])   . CHF (congestive heart failure) ([place]) 04/06/2018  . Hyponatremia 04/06/2018  . Acute respiratory failure ([place]) 04/06/2018  . Normocytic normochromic anemia 04/06/2018  . RLS (restless legs syndrome) 08/11/2016  . Demand ischemia ([place]) 05/27/2016  . Acute on chronic diastolic congestive heart failure ([place]) 05/26/2016  . Atrial fibrillation with RVR ([place]) 05/26/2016  . Insomnia 01/22/2016  . Sinus bradycardia   . Chronic diastolic CHF (congestive heart failure) ([place])   . Essential hypertension   . PAF (paroxysmal atrial fibrillation) ([place])   . Stroke ([place])   . Memory deficit 05/24/2013  . Arthritis 10/13/2011  . Long term (current) use of anticoagulants 07/21/2011  . Dyslipidemia 07/10/2011   PCP:  Lajean Manes, MD Pharmacy:   [facility], [place] [address] [place] [address] Phone: [phone] Fax: [phone]     Social  Determinants of Health (SDOH) Interventions    Readmission Risk Interventions 30 Day Unplanned Readmission Risk Score     ED to Hosp-Admission (Discharged) from 12/21/2018 in [facility]  30 Day Unplanned Readmission Risk Score (%)  15 Filed at 12/22/2018 1200     This score is the patient's risk of an unplanned readmission within 30 days of being discharged (0 -100%). The score is based on dignosis, age, lab data, medications, orders, and past utilization.   Low:  0-14.9   Medium: 15-21.9   High: 22-29.9   Extreme: 30 and above       No flowsheet data found.

## 2019-01-15 NOTE — NC FL2 (Signed)
[place] LEVEL OF CARE SCREENING TOOL     IDENTIFICATION  Patient Name: [name] Birthdate: [birth year] Sex: female Admission Date (Current Location): 01/13/2019  [facility] and [place] Number:  Herbalist and Address:  [facility],  [place] [address] [place]      Provider Number: [id number]  Attending Physician Name and Address:  Flora Lipps, MD  Relative Name and Phone Number:       Current Level of Care: Hospital Recommended Level of Care: [place] Prior Approval Number:    Date Approved/Denied:   PASRR Number: [id number]  Discharge Plan: SNF    Current Diagnoses: Patient Active Problem List   Diagnosis Date Noted  . Sacral decubitus ulcer, stage IV ([place]) 01/13/2019  . CKD (chronic kidney disease), stage IV ([place]) 01/13/2019  . Dehydration 01/13/2019  . Hypotension 01/13/2019  . Anemia in other chronic diseases classified elsewhere 12/21/2018  . Severe anemia 12/21/2018  . Atypical atrial flutter ([place])   . CHF (congestive heart failure) ([place]) 04/06/2018  . Hyponatremia 04/06/2018  . Acute respiratory failure ([place]) 04/06/2018  . Normocytic normochromic anemia 04/06/2018  . RLS (restless legs syndrome) 08/11/2016  . Demand ischemia ([place]) 05/27/2016  . Acute on chronic diastolic congestive heart failure ([place]) 05/26/2016  . Atrial fibrillation with RVR ([place]) 05/26/2016  . Insomnia 01/22/2016  . Sinus bradycardia   . Chronic diastolic CHF (congestive heart failure) ([place])   . Essential hypertension   . PAF (paroxysmal atrial fibrillation) ([place])   . Stroke ([place])   . Memory deficit 05/24/2013  . Arthritis 10/13/2011  . Long term (current) use of anticoagulants 07/21/2011  . Dyslipidemia 07/10/2011    Orientation RESPIRATION BLADDER Height & Weight     Self, Time, Situation, Place  Normal Incontinent Weight: 98 lb (44.5 kg) Height:  4\' 10"  (147.3 cm)  BEHAVIORAL SYMPTOMS/MOOD NEUROLOGICAL BOWEL  NUTRITION STATUS      Continent Diet(see DC summary)  AMBULATORY STATUS COMMUNICATION OF NEEDS Skin   Extensive Assist Verbally PU Stage and Appropriate Care PU Stage 1 Dressing: (unstageable on sacrum, foam dressing, changed PRN)                     Personal Care Assistance Level of Assistance  Bathing, Feeding, Dressing Bathing Assistance: Maximum assistance Feeding assistance: Limited assistance Dressing Assistance: Maximum assistance     Functional Limitations Info  Sight, Hearing, Speech Sight Info: Impaired(wears glasses) Hearing Info: Impaired(hard of hearing) Speech Info: Adequate    SPECIAL CARE FACTORS FREQUENCY                       Contractures Contractures Info: Not present    Additional Factors Info  Code Status, Allergies Code Status Info: DNR Allergies Info: Codeine           Current Medications (01/15/2019):  This is the current hospital active medication list Current Facility-Administered Medications  Medication Dose Route Frequency Provider Last Rate Last Dose  . acetaminophen (TYLENOL) tablet 650 mg  650 mg Oral Q6H PRN Amin, Ankit Chirag, MD       Or  . acetaminophen (TYLENOL) suppository 650 mg  650 mg Rectal Q6H PRN Amin, Ankit Chirag, MD      . amiodarone (PACERONE) tablet 400 mg  400 mg Oral Daily Amin, Ankit Chirag, MD   400 mg at 01/15/19 0844  . bisacodyl (DULCOLAX) suppository 10 mg  10 mg Rectal Daily PRN Amin, Ankit  Chirag, MD      . enoxaparin (LOVENOX) injection 30 mg  30 mg Subcutaneous Q24H Amin, Ankit Chirag, MD   30 mg at 01/14/19 2058  . feeding supplement (ENSURE ENLIVE) (ENSURE ENLIVE) liquid 237 mL  237 mL Oral BID BM Pokhrel, Laxman, MD      . feeding supplement (PRO-STAT SUGAR FREE 64) liquid 30 mL  30 mL Oral Daily Pokhrel, Laxman, MD      . HYDROcodone-acetaminophen (NORCO/VICODIN) 5-325 MG per tablet 1-2 tablet  1-2 tablet Oral Q4H PRN Amin, Jeanella Flattery, MD   1 tablet at 01/15/19 1350  . LORazepam (ATIVAN) tablet  0.25 mg  0.25 mg Oral QHS PRN Allie Bossier, MD   0.25 mg at 01/14/19 2056  . Melatonin TABS 9 mg  9 mg Oral QHS PRN Allie Bossier, MD      . multivitamin with minerals tablet 1 tablet  1 tablet Oral Daily Pokhrel, Laxman, MD      . nutrition supplement (JUVEN) (JUVEN) powder packet 1 packet  1 packet Oral BID BM Pokhrel, Laxman, MD      . nystatin (MYCOSTATIN) 100000 UNIT/ML suspension 500,000 Units  5 mL Oral QID Damita Lack, MD   500,000 Units at 01/15/19 1349  . ondansetron (ZOFRAN) tablet 8 mg  8 mg Oral BID PRN Amin, Ankit Chirag, MD   8 mg at 01/15/19 1106  . polyethylene glycol (MIRALAX / GLYCOLAX) packet 17 g  17 g Oral Daily PRN Amin, Ankit Chirag, MD      . sodium chloride (OCEAN) 0.65 % nasal spray 1 spray  1 spray Each Nare PRN Pokhrel, Laxman, MD         Discharge Medications: Please see discharge summary for a list of discharge medications.  Relevant Imaging Results:  Relevant Lab Results:   Additional Information SS#: [id number]  Geralynn Ochs, LCSW

## 2019-01-15 NOTE — Telephone Encounter (Signed)
Left message letting patient know our office will be closed, on 01/16/2019, to disinfect the facility.  We will call back to reschedule the appointment.  

## 2019-01-16 ENCOUNTER — Ambulatory Visit: Payer: Self-pay | Admitting: Neurology

## 2019-01-16 DIAGNOSIS — G2581 Restless legs syndrome: Secondary | ICD-10-CM | POA: Diagnosis present

## 2019-01-16 DIAGNOSIS — L89154 Pressure ulcer of sacral region, stage 4: Secondary | ICD-10-CM | POA: Diagnosis present

## 2019-01-16 DIAGNOSIS — R627 Adult failure to thrive: Secondary | ICD-10-CM | POA: Diagnosis present

## 2019-01-16 DIAGNOSIS — M810 Age-related osteoporosis without current pathological fracture: Secondary | ICD-10-CM | POA: Diagnosis present

## 2019-01-16 DIAGNOSIS — Z823 Family history of stroke: Secondary | ICD-10-CM | POA: Diagnosis not present

## 2019-01-16 DIAGNOSIS — N184 Chronic kidney disease, stage 4 (severe): Secondary | ICD-10-CM | POA: Diagnosis present

## 2019-01-16 DIAGNOSIS — I9589 Other hypotension: Secondary | ICD-10-CM | POA: Diagnosis present

## 2019-01-16 DIAGNOSIS — I5042 Chronic combined systolic (congestive) and diastolic (congestive) heart failure: Secondary | ICD-10-CM | POA: Diagnosis present

## 2019-01-16 DIAGNOSIS — F5104 Psychophysiologic insomnia: Secondary | ICD-10-CM | POA: Diagnosis present

## 2019-01-16 DIAGNOSIS — Z515 Encounter for palliative care: Secondary | ICD-10-CM | POA: Diagnosis present

## 2019-01-16 DIAGNOSIS — Z791 Long term (current) use of non-steroidal anti-inflammatories (NSAID): Secondary | ICD-10-CM | POA: Diagnosis not present

## 2019-01-16 DIAGNOSIS — L89610 Pressure ulcer of right heel, unstageable: Secondary | ICD-10-CM | POA: Diagnosis not present

## 2019-01-16 DIAGNOSIS — I130 Hypertensive heart and chronic kidney disease with heart failure and stage 1 through stage 4 chronic kidney disease, or unspecified chronic kidney disease: Secondary | ICD-10-CM | POA: Diagnosis present

## 2019-01-16 DIAGNOSIS — I5040 Unspecified combined systolic (congestive) and diastolic (congestive) heart failure: Secondary | ICD-10-CM | POA: Diagnosis not present

## 2019-01-16 DIAGNOSIS — I5032 Chronic diastolic (congestive) heart failure: Secondary | ICD-10-CM | POA: Diagnosis not present

## 2019-01-16 DIAGNOSIS — Z8249 Family history of ischemic heart disease and other diseases of the circulatory system: Secondary | ICD-10-CM | POA: Diagnosis not present

## 2019-01-16 DIAGNOSIS — F0390 Unspecified dementia without behavioral disturbance: Secondary | ICD-10-CM | POA: Diagnosis not present

## 2019-01-16 DIAGNOSIS — M069 Rheumatoid arthritis, unspecified: Secondary | ICD-10-CM | POA: Diagnosis not present

## 2019-01-16 DIAGNOSIS — I480 Paroxysmal atrial fibrillation: Secondary | ICD-10-CM | POA: Diagnosis present

## 2019-01-16 DIAGNOSIS — Z818 Family history of other mental and behavioral disorders: Secondary | ICD-10-CM | POA: Diagnosis not present

## 2019-01-16 DIAGNOSIS — E860 Dehydration: Secondary | ICD-10-CM | POA: Diagnosis present

## 2019-01-16 DIAGNOSIS — I4891 Unspecified atrial fibrillation: Secondary | ICD-10-CM | POA: Diagnosis not present

## 2019-01-16 DIAGNOSIS — Z79890 Hormone replacement therapy (postmenopausal): Secondary | ICD-10-CM | POA: Diagnosis not present

## 2019-01-16 DIAGNOSIS — L89612 Pressure ulcer of right heel, stage 2: Secondary | ICD-10-CM | POA: Diagnosis present

## 2019-01-16 DIAGNOSIS — Z8673 Personal history of transient ischemic attack (TIA), and cerebral infarction without residual deficits: Secondary | ICD-10-CM | POA: Diagnosis not present

## 2019-01-16 DIAGNOSIS — B370 Candidal stomatitis: Secondary | ICD-10-CM | POA: Diagnosis present

## 2019-01-16 DIAGNOSIS — Z66 Do not resuscitate: Secondary | ICD-10-CM | POA: Diagnosis present

## 2019-01-16 DIAGNOSIS — Z6820 Body mass index (BMI) 20.0-20.9, adult: Secondary | ICD-10-CM | POA: Diagnosis not present

## 2019-01-16 DIAGNOSIS — R112 Nausea with vomiting, unspecified: Secondary | ICD-10-CM | POA: Diagnosis present

## 2019-01-16 DIAGNOSIS — D638 Anemia in other chronic diseases classified elsewhere: Secondary | ICD-10-CM | POA: Diagnosis not present

## 2019-01-16 DIAGNOSIS — S32000S Wedge compression fracture of unspecified lumbar vertebra, sequela: Secondary | ICD-10-CM | POA: Diagnosis not present

## 2019-01-16 DIAGNOSIS — N179 Acute kidney failure, unspecified: Secondary | ICD-10-CM | POA: Diagnosis present

## 2019-01-16 DIAGNOSIS — D631 Anemia in chronic kidney disease: Secondary | ICD-10-CM | POA: Diagnosis present

## 2019-01-16 LAB — BPAM RBC
Blood Product Expiration Date: 202004102359
Blood Product Expiration Date: 202004112359
ISSUE DATE / TIME: 202003142232
ISSUE DATE / TIME: 202003150451
UNIT TYPE AND RH: 5100
Unit Type and Rh: 5100

## 2019-01-16 LAB — TYPE AND SCREEN
ABO/RH(D): O POS
Antibody Screen: NEGATIVE
Unit division: 0
Unit division: 0

## 2019-01-16 NOTE — Progress Notes (Signed)
Discussed disposition planning for pt with her son and with [facility] liaison. Plan to DC to a SNF for long term care with hospice. Pt has several referrals pending and CSW reached out to facilities' admissions this morning as well- bed offers pending (barriers are long term care beds and restrictions on admissions during this time). Will follow up.  Sharren Bridge, MSW, LCSW Clinical Social Work 01/16/2019 [phone]

## 2019-01-16 NOTE — Progress Notes (Signed)
PROGRESS NOTE  [name] GGY:[id number] DOB: 11/12/1927 DOA: 01/13/2019 PCP: Lajean Manes, MD   LOS: 0 days   Brief narrative:  [name] is a 85 y.o. female with medical history significant of with past medical history of paroxysmal atrial fibrillation, diastolic CHF, insomnia, failure to thrive, stage IV sacral decubitus ulcer essential hypertension was sent to the hospital from assisted living facility for evaluation of nausea, vomiting and diarrhea.  Per patient, she had one episode of nausea yesterday evening with one episode of nonbloody vomiting and nonbloody diarrhea.  She appeared slightly weak  therefore she was brought to the ER for further evaluation.  Of note, patient was admitted here for symptomatic anemia and failure to thrive about a month ago. She has been followed by hospice team at the facility. In the ER, patient was initially noted to have soft blood pressures which responded to IV fluids.  Hemoglobin was more or less close to her baseline 7.2.   Subjective:  Patient denies interval complaints.  She does complain of pain especially on moving in dressing of her wounds.  Denies any nausea vomiting or fever.  Assessment/Plan:  Principal Problem:   Hypotension Active Problems:   Dyslipidemia   Chronic diastolic CHF (congestive heart failure) (HCC)   PAF (paroxysmal atrial fibrillation) (HCC)   RLS (restless legs syndrome)   Anemia in other chronic diseases classified elsewhere   Sacral decubitus ulcer, stage IV (HCC)   CKD (chronic kidney disease), stage IV (HCC)   Dehydration  Hypotension secondary to moderate to severe dehydration -Resolved with IV fluids and blood transfusion.  Blood pressure on the lower side.  Abdominal discomfort/nonbloody vomiting/nonbloody diarrhea -This has self resolved.    Abdominal examination is benign.  Oral thrush - Nystatin swish and swallow to continue  Failure to thrive with poor oral intake - Supportive care  Stage  IV sacral decubitus ulcer with severe pain. -No obvious evidence of active infection.  Continue wound care.  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    Previously on hospice status. Compensated at this time.  Received 2 units of packed RBC.  Start the patient on low-dose Lasix from today.  Anemia of chronic disease -Status post 2 units of packed RBC.  Monitor hemoglobin  Paroxysmal atrial fibrillation -On amiodarone, continue this.  Not on long-term anticoagulation.  VTE Prophylaxis: SCD  Code Status: DNR  Family Communication: No one is available at bedside today  Disposition Plan: Skilled nursing facility with hospice.  Patient is stable for disposition.  Social worker is on board.  We will change the patient's status to inpatient due to plan for hospice care  status and the need for wound care and the need for skilled nursing facility.  Consultants:  hospice  Procedures:  PRBC transfusion  Antibiotics: Anti-infectives (From admission, onward)   None     Objective: Vitals:   01/16/19 0031 01/16/19 0640  BP: (!) 116/46 (!) 107/51  Pulse: 62 63  Resp:  18  Temp: 98.1 F (36.7 C) 98.2 F (36.8 C)  SpO2:      Intake/Output Summary (Last 24 hours) at 01/16/2019 1204 Last data filed at 01/16/2019 0723 Gross per 24 hour  Intake -  Output 550 ml  Net -550 ml   Filed Weights   01/13/19 1326  Weight: 44.5 kg   Body mass index is 20.48 kg/m.   Physical Exam: General: Thinly built, not in obvious distress, alert awake and communicative. HENT:  Normocephalic, pupils equally reacting to light and accommodation.  Mild pallor noted. Oral mucosa is moist.  Chest:  Clear breath sounds.  Diminished breath sounds bilaterally. No crackles or wheezes.  CVS: S1 &S2 heard. No murmur.  Regular rate and rhythm. Abdomen: Soft, nontender, nondistended.  Bowel sounds are heard.  Liver is not palpable, no abdominal mass palpated  Extremities: No cyanosis, clubbing or edema.  Peripheral pulses are palpable. Psych: Alert, awake and oriented, normal mood CNS:  No cranial nerve deficits.  Power equal in all extremities.  No sensory deficits noted.  No cerebellar signs.   Skin: Warm and dry.  Stage IV sacral decubitus ulceration present on admission   Data Review: I have personally reviewed the following laboratory data and studies,  CBC: Recent Labs  Lab 01/13/19 1430 01/14/19 0354 01/15/19 1105  WBC 6.1 6.1 12.5*  HGB 7.2* 6.9* 10.9*  HCT 24.4* 24.4* 35.6*  MCV 78.2* 81.3 83.6  PLT 269 266 188   Basic Metabolic Panel: Recent Labs  Lab 01/13/19 1430 01/14/19 0354 01/15/19 1105  NA 136 139 137  K 4.4 4.5 4.5  CL 100 108 106  CO2 26 25 23   GLUCOSE 90 89 100*  BUN 94* 82* 70*  CREATININE 2.03* 1.89* 1.68*  CALCIUM 8.6* 8.5* 8.5*  MG  --  2.2 2.2   Liver Function Tests: Recent Labs  Lab 01/13/19 1430 01/14/19 0354  AST 33 29  ALT 35 33  ALKPHOS 93 83  BILITOT 0.6 0.4  PROT 5.5* 4.9*  ALBUMIN 2.3* 2.0*   No results for input(s): LIPASE, AMYLASE in the last 168 hours. No results for input(s): AMMONIA in the last 168 hours. Cardiac Enzymes: No results for input(s): CKTOTAL, CKMB, CKMBINDEX, TROPONINI in the last 168 hours. BNP (last 3 results) Recent Labs    04/06/18 0240 12/21/18 1344  BNP 388.3* 307.7*    ProBNP (last 3 results) No results for input(s): PROBNP in the last 8760 hours.  CBG: No results for input(s): GLUCAP in the last 168 hours. Recent Results (from the past 240 hour(s))  MRSA PCR Screening     Status: None   Collection Time: 01/13/19 10:08 PM  Result Value Ref Range Status   MRSA by PCR NEGATIVE NEGATIVE Final    Comment:        The GeneXpert MRSA Assay (FDA approved for NASAL specimens only), is one component of a comprehensive MRSA colonization surveillance program. It is not intended to diagnose MRSA infection nor to guide or monitor treatment for MRSA  infections. Performed at [facility], [place] [address] [place],  [address]      Studies: No results found.  Scheduled Meds: . amiodarone  400 mg Oral Daily  . enoxaparin (LOVENOX) injection  30 mg Subcutaneous Q24H  . feeding supplement (ENSURE ENLIVE)  237 mL Oral BID BM  . feeding supplement (PRO-STAT SUGAR FREE 64)  30 mL Oral Daily  . multivitamin with minerals  1 tablet Oral Daily  . nutrition supplement (JUVEN)  1 packet Oral BID BM  . nystatin  5 mL Oral QID    Continuous Infusions:   Flora Lipps, MD  [facility] 01/16/2019

## 2019-01-16 NOTE — Consult Note (Addendum)
[place] Nurse wound consult note Reason for Consult: Consult requested for several wounds. Pt is followed by hospice prior to admission for wound care. Sacrum with chronic stage 4 pressure injury; 25% eschar to wound edges, 15% exposed bone, 60% red.  Mod amt brown, foul-smelling drainage.  13X8X3cm with undermining to wound edges to 3 cm Right heel with stage 2 pressure injury; 3X3X.2cm, red and moist, small amt tan drainage, no odor Left heel with blanchable erythremia; 4X4cm with intact skin Left calf with full thickness wound from previous skin tear; 50% of flap is approximated, black eschar at edges, 50% reddish-yellow full thickness wound in 2 locations; 10X6X.2cm and 2X2X.2cm, mod amt tan drainage, no odor. Dressing procedure/placement/frequency: Moist gauze dressing BID to sacrum Aquacel to promote healing to left leg wounds.  Foam dressing to bilat heels and float to reduce pressure.  Discussed plan of care with patient, no family members at the bedside.  Please re-consult if further assistance is needed.  Thank-you,  Julien Girt MSN, [place], [place], [place], [place]

## 2019-01-16 NOTE — Progress Notes (Signed)
[facility] ([facility]) GIP RN Note.   This is a related and covered GIP admission of 01/13/2019 with [facility] diagnosis of combined systolic and diastolic heart failure per Dr. Tomasa Hosteller. Patient has an [place] DNR. Facility called EMS for transport to ED stating patient had had nausea, vomiting and diarrhea for 2 days and pt appeared weaker. Pt did not wish to come to ED but the facility insisted. Hospice was notified prior to transport to the ED.  Pt is admitted to the hospital for acute kidney injury and hypotension secondary to dehydration.  Transfer summary and current [facility] medication list remain on shadow chart.  VS: 98.2, 107/51, 66, 18 RA  I/O: 302/350 Purewick draining clear yellow urine  Medications: Hydrocodone-acetaminophen 5-325mg  1-2 tablets PRN Q 4hrs @ 0035, 1016.   Visited with patient at bedside. She is alert and oriented. No visitors present. Staff was repositioning patient in bed. Patient experiences a lot of pain with movement and positioning related to stage IV sacral decubitus ulcer.  Patient states she does not want to be in the hospital and when discharged does not want to return.    Goals of care: as above. Pt emphasized today that she does not want to return to the hospital. Patient states she has had a very good life,  she just wants to not be in pain, have some control over her life and what is done to her.    Discharge Planning: SNF with hospice. Should patient require ambulance transport at time of discharge, please call our contracted provider [facility] at [phone].  Communication with [facility]: Spoke with son, Tim by phone. He states he is actively looking for SNF for his mother.   Communication with [facility]: per clinical note.  Please call with any hospice related questions or concerns.  Thank you, Farrel Gordon, RN, [facility] Liaison [phone]  [place] are on [place].

## 2019-01-17 MED ORDER — FUROSEMIDE 40 MG PO TABS: 20.0000 mg | ORAL_TABLET | ORAL | Status: AC

## 2019-01-17 MED ORDER — ONDANSETRON HCL 4 MG PO TABS: 4.0000 mg | ORAL_TABLET | Freq: Every day | ORAL | Status: AC | PRN

## 2019-01-17 MED ORDER — HYDROCODONE-ACETAMINOPHEN 5-325 MG PO TABS: 2.0000 | ORAL_TABLET | ORAL | 0 refills | Status: DC | PRN

## 2019-01-17 MED ORDER — POLYETHYLENE GLYCOL 3350 17 G PO PACK: 17.0000 g | PACK | Freq: Every day | ORAL | 0 refills | Status: AC | PRN

## 2019-01-17 MED ORDER — ENSURE ENLIVE PO LIQD: 237.0000 mL | Freq: Two times a day (BID) | ORAL | Status: AC

## 2019-01-17 MED ORDER — LORAZEPAM 0.5 MG PO TABS: 0.2500 mg | ORAL_TABLET | Freq: Every evening | ORAL | 0 refills | Status: DC | PRN

## 2019-01-17 NOTE — Discharge Summary (Signed)
Physician Discharge Summary  INDIANA GAMERO OIB:[id number] DOB: January 24, 1928 DOA: 01/13/2019  PCP: Lajean Manes, MD  Admit date: 01/13/2019 Discharge date: 01/17/2019  Admitted From: Home  Discharge disposition: Skilled nursing facility   Recommendations for Outpatient Follow-Up:   Follow-up with your primary care provider at the skilled nursing facility.  Discharge Diagnosis:   Principal Problem:   Hypotension Active Problems:   Dyslipidemia   Chronic diastolic CHF (congestive heart failure) (HCC)   PAF (paroxysmal atrial fibrillation) (HCC)   RLS (restless legs syndrome)   Anemia in other chronic diseases classified elsewhere   Sacral decubitus ulcer, stage IV (HCC)   CKD (chronic kidney disease), stage IV ([place])   Dehydration   Discharge Condition: Improved.  Diet recommendation:  Regular.  Wound care: Continue for sacral decubitus ulcer  Code status: DNR   History of Present Illness:   Teandra Harlan Hullis a 85 y.o.femalewith medical history significant ofwith past medical history of paroxysmal atrial fibrillation, diastolic CHF, insomnia, failure to thrive, stage IV sacral decubitus ulcer, essential hypertension was sent to the hospital from assisted living facility for evaluation of nausea, vomiting and diarrhea. Per patient, she had one episode of nausea yesterday evening with one episode of nonbloody vomiting and nonbloody diarrhea. She appeared slightly weak  therefore she was brought to the ER for further evaluation. Of note, patient was admitted here for symptomatic anemia and failure to thrive about a month ago. She has been followed by hospice team at the facility. In the ER, patient was initially noted to have soft blood pressures which responded to IV fluids. Hemoglobin was more or less close to her baseline 7.2.    Hospital Course:  Patient was admitted to hospital in following conditions were addressed during hospitalization,  Hypotension secondary to  moderate to severe dehydration -Resolved with IV fluids and blood transfusion.  Lasix dose will be reduced on discharge  Abdominal discomfort/nonbloody vomiting/nonbloody diarrhea -This has self resolved.   Abdominal examination is benign.  Oral thrush -Nystatin swish and swallow was used with improvement.  Failure to thrive with poor oral intake -Supportive care  Stage IV sacral decubitus ulcer with severe pain. -No obvious evidence of active infection.  Wound care was continued while in the hospital.  Continue wound care after discharge.  Patient will  need supplemental analgesics prior to wound care.  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  Previously on hospice status. Compensated at this time.  Received 2 units of packed RBC.    Change Lasix to 20 mg every other day on discharge due to recent dehydration and volume depletion.  Anemia of chronic disease -Status post 2 units of packed RBC.   Paroxysmal atrial fibrillation -On amiodarone, continue this. Not on long-term anticoagulation.  Disposition.  At this time patient has been considered for skilled nursing facility placement with hospice care.  Medical Consultants:    Hospice care   Subjective:   Today, patient feels at her baseline.  Complains of back pain on wound care.  Discharge Exam:   Vitals:   01/16/19 2032 01/17/19 0438  BP: (!) 113/49 (!) 143/50  Pulse: (!) 59 66  Resp: 18 18  Temp: 98.1 F (36.7 C) 98.7 F (37.1 C)  SpO2:  95%   Vitals:   01/16/19 0640 01/16/19 1417 01/16/19 2032 01/17/19 0438  BP: (!) 107/51 (!) 113/57 (!) 113/49 (!) 143/50  Pulse: 63 60 (!) 59 66  Resp: 18 16 18 18   Temp:  98.2 F (36.8 C) 98.2 F (36.8 C) 98.1 F (36.7 C) 98.7 F (37.1 C)  TempSrc: Oral Oral Oral Oral  SpO2:  93%  95%  Weight:      Height:        General exam: Thinly built, not in obvious distress.  Alert awake and communicative.  Mildly  anxious  HEENT:PERRL,Oral mucosa moist Respiratory system: Bilateral equal air entry, normal vesicular breath sounds, no wheezes or crackles  Cardiovascular system: S1 & S2 heard, RRR.  Gastrointestinal system: Abdomen is nondistended, soft and nontender. No organomegaly or masses felt. Normal bowel sounds heard. Central nervous system: Alert and oriented.  Moves all extremities.  Extremities: No edema, no clubbing ,no cyanosis, distal peripheral pulses palpable. Skin: Stage IV sacral decubitus ulceration present on admission. MSK: Normal muscle bulk,tone ,power    Procedures:     The results of significant diagnostics from this hospitalization (including imaging, microbiology, ancillary and laboratory) are listed below for reference.     Diagnostic Studies:   No results found.   Labs:   Basic Metabolic Panel: Recent Labs  Lab 01/13/19 1430 01/14/19 0354 01/15/19 1105  NA 136 139 137  K 4.4 4.5 4.5  CL 100 108 106  CO2 26 25 23   GLUCOSE 90 89 100*  BUN 94* 82* 70*  CREATININE 2.03* 1.89* 1.68*  CALCIUM 8.6* 8.5* 8.5*  MG  --  2.2 2.2   GFR Estimated Creatinine Clearance: 14.9 mL/min (A) (by C-G formula based on SCr of 1.68 mg/dL (H)). Liver Function Tests: Recent Labs  Lab 01/13/19 1430 01/14/19 0354  AST 33 29  ALT 35 33  ALKPHOS 93 83  BILITOT 0.6 0.4  PROT 5.5* 4.9*  ALBUMIN 2.3* 2.0*   No results for input(s): LIPASE, AMYLASE in the last 168 hours. No results for input(s): AMMONIA in the last 168 hours. Coagulation profile Recent Labs  Lab 01/14/19 0354  INR 1.1    CBC: Recent Labs  Lab 01/13/19 1430 01/14/19 0354 01/15/19 1105  WBC 6.1 6.1 12.5*  HGB 7.2* 6.9* 10.9*  HCT 24.4* 24.4* 35.6*  MCV 78.2* 81.3 83.6  PLT 269 266 252   Cardiac Enzymes: No results for input(s): CKTOTAL, CKMB, CKMBINDEX, TROPONINI in the last 168 hours. BNP: Invalid input(s): POCBNP CBG: No results for input(s): GLUCAP in the last 168 hours. D-Dimer No  results for input(s): DDIMER in the last 72 hours. Hgb A1c No results for input(s): HGBA1C in the last 72 hours. Lipid Profile No results for input(s): CHOL, HDL, LDLCALC, TRIG, CHOLHDL, LDLDIRECT in the last 72 hours. Thyroid function studies No results for input(s): TSH, T4TOTAL, T3FREE, THYROIDAB in the last 72 hours.  Invalid input(s): FREET3 Anemia work up No results for input(s): VITAMINB12, FOLATE, FERRITIN, TIBC, IRON, RETICCTPCT in the last 72 hours. Microbiology Recent Results (from the past 240 hour(s))  MRSA PCR Screening     Status: None   Collection Time: 01/13/19 10:08 PM  Result Value Ref Range Status   MRSA by PCR NEGATIVE NEGATIVE Final    Comment:        The GeneXpert MRSA Assay (FDA approved for NASAL specimens only), is one component of a comprehensive MRSA colonization surveillance program. It is not intended to diagnose MRSA infection nor to guide or monitor treatment for MRSA infections. Performed at [facility], [place] [address], [place], [place] [address]      Discharge Instructions:   Discharge Instructions    Diet general   Complete  by:  As directed    Discharge instructions   Complete by:  As directed    Continue to take medications as prescribed.  Follow-up with hospice at the skilled nursing facility.   Increase activity slowly   Complete by:  As directed      Allergies as of 01/17/2019      Reactions   Codeine Nausea Only      Medication List    STOP taking these medications   amoxicillin-clavulanate 500-125 MG tablet Commonly known as:  AUGMENTIN   traMADol 50 MG tablet Commonly known as:  ULTRAM     TAKE these medications   acetaminophen 500 MG tablet Commonly known as:  TYLENOL Take 500 mg by mouth 2 (two) times daily as needed for mild pain.   amiodarone 200 MG tablet Commonly known as:  PACERONE Take 1 tablet (200 mg total) by mouth daily. What changed:  Another medication with the same name was  removed. Continue taking this medication, and follow the directions you see here.   Bacitraycin Plus 500 UNIT/GM ointment Generic drug:  bacitracin Apply 1 application topically daily.   clotrimazole 10 MG troche Commonly known as:  MYCELEX Take 1 tablet (10 mg total) by mouth 5 (five) times daily.   collagenase ointment Commonly known as:  SANTYL Apply topically daily.   diclofenac sodium 1 % Gel Commonly known as:  VOLTAREN Apply 2 g topically 2 (two) times daily.   Eszopiclone 3 MG Tabs Take 3 mg by mouth at bedtime as needed (sleep). Take immediately before bedtime   feeding supplement (ENSURE ENLIVE) Liqd Take 237 mLs by mouth 2 (two) times daily between meals.   fentaNYL 50 MCG/HR Commonly known as:  [place] 1 patch onto the skin every 3 (three) days.   furosemide 40 MG tablet Commonly known as:  LASIX Take 0.5 tablets (20 mg total) by mouth every other day. What changed:  how much to take   HYDROcodone-acetaminophen 5-325 MG tablet Commonly known as:  NORCO/VICODIN Take 2 tablets by mouth every 4 (four) hours as needed for moderate pain.   hydrOXYzine 10 MG tablet Commonly known as:  ATARAX/VISTARIL Take 1 tablet (10 mg total) by mouth 3 (three) times daily as needed for anxiety.   ICAPS AREDS 2 PO Take 1 capsule by mouth 2 (two) times daily.   isosorbide mononitrate 30 MG 24 hr tablet Commonly known as:  IMDUR Take 1 tablet (30 mg total) by mouth daily.   LORazepam 0.5 MG tablet Commonly known as:  ATIVAN Take 0.5 tablets (0.25 mg total) by mouth at bedtime as needed for anxiety.   Melatonin 3 MG Tabs Take 3 tablets (9 mg total) by mouth at bedtime as needed (for sleep).   metoprolol succinate 25 MG 24 hr tablet Commonly known as:  TOPROL-XL Take 0.5 tablets (12.5 mg total) by mouth daily. What changed:  how much to take   nitroGLYCERIN 0.4 MG SL tablet Commonly known as:  NITROSTAT DISSOLVE 1 TABLET UNDER THE TONGUE EVERY 5 MINUTES AS  NEEDED FOR CHEST PAIN What changed:  See the new instructions.   ondansetron 8 MG tablet Commonly known as:  ZOFRAN Take 8 mg by mouth 2 (two) times daily as needed for nausea or vomiting. What changed:  Another medication with the same name was added. Make sure you understand how and when to take each.   ondansetron 4 MG tablet Commonly known as:  Zofran Take 1 tablet (4 mg total) by mouth  daily as needed for nausea or vomiting. What changed:  You were already taking a medication with the same name, and this prescription was added. Make sure you understand how and when to take each.   pantoprazole 40 MG tablet Commonly known as:  Protonix Take 1 tablet (40 mg total) by mouth daily.   polyethylene glycol packet Commonly known as:  MIRALAX / GLYCOLAX Take 17 g by mouth daily as needed for mild constipation.       Time coordinating discharge: 39 minutes  Signed:  Taletha Twiford  [facility] 01/17/2019, 11:11 AM

## 2019-01-17 NOTE — Progress Notes (Signed)
[facility] Christia Reading, pt son called and updated on transfer. Report given to facility. No other questions or concerns from patient.

## 2019-01-17 NOTE — TOC Transition Note (Signed)
Transition of Care [facility]) - CM/SW Discharge Note   Patient Details  Name: [name] MRN: [id number] Date of Birth: [birth year]  [facility] CM/SW Contact:  Nila Nephew, LCSW Phone Number: [phone] 01/17/2019, 10:35 AM   Clinical Narrative:    Pt admitting to [facility] SNF- report [id number]  Will arrange [facility] transportation. Son aware and agreeable. [facility] following.     Barriers to Discharge: Continued Medical Work up, Hospice Bed not available   Patient Goals and CMS Choice Patient states their goals for this hospitalization and ongoing recovery are:: be comfortable and well cared for CMS Medicare.gov Compare Post Acute Care list provided to:: Patient Choice offered to / list presented to : Patient  Discharge Placement                       Discharge Plan and Services   Post Acute Care Choice: [place]                    Social Determinants of Health (SDOH) Interventions     Readmission Risk Interventions No flowsheet data found.

## 2019-01-18 DIAGNOSIS — S32000S Wedge compression fracture of unspecified lumbar vertebra, sequela: Secondary | ICD-10-CM | POA: Diagnosis not present

## 2019-01-18 DIAGNOSIS — F0390 Unspecified dementia without behavioral disturbance: Secondary | ICD-10-CM | POA: Diagnosis not present

## 2019-01-18 DIAGNOSIS — I4891 Unspecified atrial fibrillation: Secondary | ICD-10-CM | POA: Diagnosis not present

## 2019-01-18 DIAGNOSIS — M069 Rheumatoid arthritis, unspecified: Secondary | ICD-10-CM | POA: Diagnosis not present

## 2019-01-18 DIAGNOSIS — L89610 Pressure ulcer of right heel, unstageable: Secondary | ICD-10-CM | POA: Diagnosis not present

## 2019-01-18 DIAGNOSIS — I5040 Unspecified combined systolic (congestive) and diastolic (congestive) heart failure: Secondary | ICD-10-CM | POA: Diagnosis not present

## 2019-01-19 ENCOUNTER — Non-Acute Institutional Stay (SKILLED_NURSING_FACILITY): Payer: Medicare Other | Admitting: Internal Medicine

## 2019-01-19 ENCOUNTER — Other Ambulatory Visit: Payer: Self-pay | Admitting: Internal Medicine

## 2019-01-19 ENCOUNTER — Encounter: Payer: Self-pay | Admitting: Internal Medicine

## 2019-01-19 DIAGNOSIS — D638 Anemia in other chronic diseases classified elsewhere: Secondary | ICD-10-CM

## 2019-01-19 DIAGNOSIS — M069 Rheumatoid arthritis, unspecified: Secondary | ICD-10-CM | POA: Diagnosis not present

## 2019-01-19 DIAGNOSIS — E861 Hypovolemia: Secondary | ICD-10-CM | POA: Diagnosis not present

## 2019-01-19 DIAGNOSIS — I9589 Other hypotension: Secondary | ICD-10-CM

## 2019-01-19 DIAGNOSIS — B370 Candidal stomatitis: Secondary | ICD-10-CM

## 2019-01-19 DIAGNOSIS — L89154 Pressure ulcer of sacral region, stage 4: Secondary | ICD-10-CM | POA: Diagnosis not present

## 2019-01-19 DIAGNOSIS — I5040 Unspecified combined systolic (congestive) and diastolic (congestive) heart failure: Secondary | ICD-10-CM | POA: Diagnosis not present

## 2019-01-19 DIAGNOSIS — I5042 Chronic combined systolic (congestive) and diastolic (congestive) heart failure: Secondary | ICD-10-CM | POA: Diagnosis not present

## 2019-01-19 DIAGNOSIS — S32000S Wedge compression fracture of unspecified lumbar vertebra, sequela: Secondary | ICD-10-CM | POA: Diagnosis not present

## 2019-01-19 DIAGNOSIS — I480 Paroxysmal atrial fibrillation: Secondary | ICD-10-CM

## 2019-01-19 DIAGNOSIS — F0390 Unspecified dementia without behavioral disturbance: Secondary | ICD-10-CM | POA: Diagnosis not present

## 2019-01-19 DIAGNOSIS — I4891 Unspecified atrial fibrillation: Secondary | ICD-10-CM | POA: Diagnosis not present

## 2019-01-19 DIAGNOSIS — G629 Polyneuropathy, unspecified: Secondary | ICD-10-CM

## 2019-01-19 DIAGNOSIS — L89610 Pressure ulcer of right heel, unstageable: Secondary | ICD-10-CM | POA: Diagnosis not present

## 2019-01-19 MED ORDER — FENTANYL 50 MCG/HR TD PT72: 1.0000 | MEDICATED_PATCH | TRANSDERMAL | 0 refills | Status: AC

## 2019-01-19 NOTE — Progress Notes (Signed)
:  Location:  [place] Room Number: 086P Place of Service:  SNF (31)  Kimyah Frein D. Sheppard Coil, MD  Patient Care Team: Lajean Manes, MD as PCP - General (Internal Medicine) Evans Lance, MD as PCP - Cardiology (Cardiology) Burtis Junes, NP as Nurse Practitioner (Nurse Practitioner) Duffy, Creola Corn, LCSW as Social Worker (Licensed Clinical Social Worker)  Extended Emergency Contact Information Primary Emergency Contact: Forehand,Timothy Address: [address] [place] Phone: [phone] Mobile Phone: [phone] Relation: Son     Allergies: Codeine  Chief Complaint  Patient presents with  . New Admit To SNF    Admit to [employer] [employer]    HPI: Patient is 85 y.o. female with paroxysmal atrial fibrillation, diastolic congestive heart failure, insomnia, failure to thrive, sacral for decubitus ulcer, hypertension, who is coming from assisted living facility where she is on hospice with complaints of nausea vomiting and diarrhea.  She denied any fever chills or other complaints.  She was admitted to [facility] for a symptomatic anemia and failure to thrive about a month ago.  In the ER patient was noted to have soft blood pressures that responded to IV fluids.  Hemoglobin was close to her baseline of 7.2.  No obvious signs of active bleeding or infection.  Patient was admitted to [facility] from 3/13-17 where she was treated for hypotension with IV fluids and oral thrush treated with swish and swallow nystatin.  Patient is admitted to skilled nursing facility for OT/PT.  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  Past Medical History:  Diagnosis Date  . Arthritis    severe  . Atrial fibrillation  ([place])   . Chronic diastolic CHF (congestive heart failure) ([place])   . Chronic insomnia   . Degenerative arthritis   . Essential hypertension   . Hypercholesteremia   . Memory deficit 05/24/2013  . Osteoporosis   . PAF (paroxysmal atrial fibrillation) ([place])    a. Intolerant to amiodarone, discontinued October 2012.  Marland Kitchen RLS (restless legs syndrome) 08/11/2016  . Sinus bradycardia   . Stroke [facility]) 2007   mini stroke or TIA with aphasia     Past Surgical History:  Procedure Laterality Date  . CATARACT EXTRACTION  2009   ou  . FEMUR FRACTURE SURGERY  8/12  . left knee  2001  . REPLACEMENT TOTAL KNEE BILATERAL Right 1999  . right intertrochanteric hip fracture status post ORIF  August 2012  . right shoulder replacement  2009    Allergies as of 01/19/2019      Reactions   Codeine Nausea Only      Medication List       Accurate as of January 19, 2019 12:19 PM. Always use your most recent med list.        acetaminophen 500 MG tablet Commonly known as:  TYLENOL Take 500 mg by mouth 2 (two) times daily as needed for mild pain.   amiodarone 200 MG tablet Commonly known as:  PACERONE Take 1 tablet (200 mg total) by mouth daily.   clotrimazole 10 MG troche Commonly known as:  MYCELEX Take 1 tablet (10 mg total) by mouth 5 (five) times daily.   collagenase ointment Commonly known as:  SANTYL Apply topically daily.  diclofenac sodium 1 % Gel Commonly known as:  VOLTAREN Apply 2 g topically 2 (two) times daily.   doxycycline 100 MG tablet Commonly known as:  VIBRA-TABS Take 100 mg by mouth. Take 1 tablet by mouth BID x 10 days for cellulitis of lat left leg   Eszopiclone 3 MG Tabs Take 3 mg by mouth at bedtime as needed (sleep). Take immediately before bedtime   feeding supplement (ENSURE ENLIVE) Liqd Take 237 mLs by mouth 2 (two) times daily between meals.   fentaNYL 50 MCG/HR Commonly known as:  Colo 1 patch onto the skin every 3 (three) days.    furosemide 40 MG tablet Commonly known as:  LASIX Take 0.5 tablets (20 mg total) by mouth every other day.   HYDROcodone-acetaminophen 5-325 MG tablet Commonly known as:  NORCO/VICODIN Take 2 tablets by mouth every 4 (four) hours as needed for moderate pain.   hydrOXYzine 10 MG tablet Commonly known as:  ATARAX/VISTARIL Take 1 tablet (10 mg total) by mouth 3 (three) times daily as needed for anxiety.   ICAPS AREDS 2 PO Take 1 capsule by mouth 2 (two) times daily.   isosorbide mononitrate 30 MG 24 hr tablet Commonly known as:  IMDUR Take 1 tablet (30 mg total) by mouth daily.   LORazepam 0.5 MG tablet Commonly known as:  ATIVAN Take 0.5 tablets (0.25 mg total) by mouth at bedtime as needed for anxiety.   Melatonin 3 MG Tabs Take 3 tablets (9 mg total) by mouth at bedtime as needed (for sleep).   metoprolol succinate 25 MG 24 hr tablet Commonly known as:  TOPROL-XL Take 0.5 tablets (12.5 mg total) by mouth daily.   nitroGLYCERIN 0.4 MG SL tablet Commonly known as:  NITROSTAT DISSOLVE 1 TABLET UNDER THE TONGUE EVERY 5 MINUTES AS NEEDED FOR CHEST PAIN   ondansetron 8 MG tablet Commonly known as:  ZOFRAN Take by mouth. Give 1 tablet by mouth twice daily as needed for nausea or vomiting   ondansetron 4 MG tablet Commonly known as:  Zofran Take 1 tablet (4 mg total) by mouth daily as needed for nausea or vomiting.   pantoprazole 40 MG tablet Commonly known as:  Protonix Take 1 tablet (40 mg total) by mouth daily.   polyethylene glycol packet Commonly known as:  MIRALAX / GLYCOLAX Take 17 g by mouth daily as needed for mild constipation.       No orders of the defined types were placed in this encounter.    There is no immunization history on file for this patient.  Social History   Tobacco Use  . Smoking status: Never Smoker  . Smokeless tobacco: Never Used  Substance Use Topics  . Alcohol use: Yes    Alcohol/week: 7.0 standard drinks    Types: 7 Glasses  of wine per week    Comment: 1 glass wine with dinner some days    Family history is   Family History  Problem Relation Age of Onset  . Dementia Mother   . Stroke Mother   . Cancer - Colon Father   . Cancer Father   . Cancer Sister   . Cancer Son   . Cancer Sister   . Coronary artery disease Other       Review of Systems  DATA OBTAINED: from patient, nurse GENERAL:  no fevers, fatigue, appetite changes SKIN: No itching, or rash EYES: No eye pain, redness, discharge EARS: No earache, tinnitus, change in hearing NOSE: No congestion, drainage  or bleeding  MOUTH/THROAT: No mouth or tooth pain, No sore throat RESPIRATORY: No cough, wheezing, SOB CARDIAC: No chest pain, palpitations, lower extremity edema  GI: No abdominal pain, No N/V/D or constipation, No heartburn or reflux  GU: No dysuria, frequency or urgency, or incontinence  MUSCULOSKELETAL: No unrelieved bone/joint pain NEUROLOGIC: No headache, dizziness or focal weakness PSYCHIATRIC: No c/o anxiety or sadness   Vitals:   01/19/19 1201  BP: (!) 86/55  Pulse: (!) 57  Resp: 18  Temp: 98.2 F (36.8 C)    SpO2 Readings from Last 1 Encounters:  01/17/19 95%   Body mass index is 20.48 kg/m.     Physical Exam  GENERAL APPEARANCE: Alert, conversant,  No acute distress.  SKIN: No diaphoresis rash HEAD: Normocephalic, atraumatic  EYES: Conjunctiva/lids clear. Pupils round, reactive. EOMs intact.  EARS: External exam WNL, canals clear. Hearing grossly normal.  NOSE: No deformity or discharge.  MOUTH/THROAT: Lips w/o lesions  RESPIRATORY: Breathing is even, unlabored. Lung sounds are clear   CARDIOVASCULAR: Heart irregular no murmurs, rubs or gallops. 2+ peripheral edema.   GASTROINTESTINAL: Abdomen is soft, non-tender, not distended w/ normal bowel sounds. GENITOURINARY: Bladder non tender, not distended  MUSCULOSKELETAL: No abnormal joints or musculature NEUROLOGIC:  Cranial nerves 2-12 grossly intact. Moves  all extremities  PSYCHIATRIC: Mood and affect appropriate to situation, no behavioral issues  Patient Active Problem List   Diagnosis Date Noted  . Sacral decubitus ulcer, stage IV ([place]) 01/13/2019  . CKD (chronic kidney disease), stage IV ([place]) 01/13/2019  . Dehydration 01/13/2019  . Hypotension 01/13/2019  . Anemia in other chronic diseases classified elsewhere 12/21/2018  . Severe anemia 12/21/2018  . Atypical atrial flutter ([place])   . CHF (congestive heart failure) ([place]) 04/06/2018  . Hyponatremia 04/06/2018  . Acute respiratory failure ([place]) 04/06/2018  . Normocytic normochromic anemia 04/06/2018  . RLS (restless legs syndrome) 08/11/2016  . Demand ischemia ([place]) 05/27/2016  . Acute on chronic diastolic congestive heart failure ([place]) 05/26/2016  . Atrial fibrillation with RVR ([place]) 05/26/2016  . Insomnia 01/22/2016  . Sinus bradycardia   . Chronic diastolic CHF (congestive heart failure) ([place])   . Essential hypertension   . PAF (paroxysmal atrial fibrillation) ([place])   . Stroke ([place])   . Memory deficit 05/24/2013  . Arthritis 10/13/2011  . Long term (current) use of anticoagulants 07/21/2011  . Dyslipidemia 07/10/2011      Labs reviewed: Basic Metabolic Panel:    Component Value Date/Time   NA 137 01/15/2019 1105   NA 136 05/23/2018 1241   K 4.5 01/15/2019 1105   CL 106 01/15/2019 1105   CO2 23 01/15/2019 1105   GLUCOSE 100 (H) 01/15/2019 1105   BUN 70 (H) 01/15/2019 1105   BUN 27 05/23/2018 1241   CREATININE 1.68 (H) 01/15/2019 1105   CREATININE 1.06 (H) 06/29/2016 1606   CALCIUM 8.5 (L) 01/15/2019 1105   PROT 4.9 (L) 01/14/2019 0354   PROT 6.7 10/13/2017 1619   ALBUMIN 2.0 (L) 01/14/2019 0354   ALBUMIN 4.0 10/13/2017 1619   AST 29 01/14/2019 0354   ALT 33 01/14/2019 0354   ALKPHOS 83 01/14/2019 0354   BILITOT 0.4 01/14/2019 0354   BILITOT 0.2 10/13/2017 1619   GFRNONAA 27 (L) 01/15/2019 1105   GFRAA 31 (L) 01/15/2019 1105    Recent Labs    04/11/18  0534  01/13/19 1430 01/14/19 0354 01/15/19 1105  NA 137   < > 136 139 137  K 4.4   < >  4.4 4.5 4.5  CL 83*   < > 100 108 106  CO2 43*   < > 26 25 23   GLUCOSE 104*   < > 90 89 100*  BUN 34*   < > 94* 82* 70*  CREATININE 1.14*   < > 2.03* 1.89* 1.68*  CALCIUM 9.9   < > 8.6* 8.5* 8.5*  MG 1.9  --   --  2.2 2.2   < > = values in this interval not displayed.   Liver Function Tests: Recent Labs    12/21/18 1342 01/13/19 1430 01/14/19 0354  AST 39 33 29  ALT 41 35 33  ALKPHOS 62 93 83  BILITOT 0.6 0.6 0.4  PROT 5.2* 5.5* 4.9*  ALBUMIN 2.5* 2.3* 2.0*   Recent Labs    12/21/18 1342  LIPASE 27   No results for input(s): AMMONIA in the last 8760 hours. CBC: Recent Labs    04/07/18 0414 04/08/18 0657  12/21/18 1342  01/13/19 1430 01/14/19 0354 01/15/19 1105  WBC 6.0 5.3   < > 18.8*   < > 6.1 6.1 12.5*  NEUTROABS 4.5 3.8  --  17.4*  --   --   --   --   HGB 10.8* 10.7*   < > 4.6*   < > 7.2* 6.9* 10.9*  HCT 34.4* 33.8*   < > 17.1*   < > 24.4* 24.4* 35.6*  MCV 95.8 94.2   < > 78.1*   < > 78.2* 81.3 83.6  PLT 147* 146*   < > 257   < > 269 266 252   < > = values in this interval not displayed.   Lipid No results for input(s): CHOL, HDL, LDLCALC, TRIG in the last 8760 hours.  Cardiac Enzymes: Recent Labs    04/06/18 0520 04/06/18 1212 04/06/18 1650  TROPONINI 0.03* 0.03* 0.03*   BNP: Recent Labs    04/06/18 0240 12/21/18 1344  BNP 388.3* 307.7*   No results found for: MICROALBUR No results found for: HGBA1C Lab Results  Component Value Date   TSH 0.837 04/06/2018   No results found for: VITAMINB12 No results found for: FOLATE No results found for: IRON, TIBC, FERRITIN  Imaging and Procedures obtained prior to SNF admission: No results found.   Not all labs, radiology exams or other studies done during hospitalization come through on my EPIC note; however they are reviewed by me.    Assessment and Plan  Hypotension/anemia- secondary to dehydration  and anemia; patient received IV fluids and 2 units PRBC, Lasix will be reduced on discharge SNF-admitted for OT/PT; posttransfusion hemoglobin 10.9; will follow-up CBC  Oral thrush SNF- continue clotrimazole troches 10 mg by mouth 5 times daily  Stage IV sacral decubitus ulcer-without evidence of infection SNF- fentanyl 50 mg patch every 72  Chronic combined systolic and diastolic congestive heart failure-EF 40 to 45% SNF- currently compensated; continue metoprolol XL 12.5 mg daily and Lasix 20 mg daily; patient's legs need to be wrapped, may need to increase Lasix, patient was on 40 mg daily prior  Paroxysmal atrial fibrillation SNF- continue amiodarone 200 mg daily; not on anticoagulation  Polyneuropathy SNF-patient is asked for something for her leg pain which sounds shooting and in her toes; will start Neurontin 300 mg nightly   Time spent greater than 45 minutes;> 50% of time with patient was spent reviewing records, labs, tests and studies, counseling and developing plan of care  Webb Silversmith D. Sheppard Coil, MD

## 2019-01-23 DIAGNOSIS — S32000S Wedge compression fracture of unspecified lumbar vertebra, sequela: Secondary | ICD-10-CM | POA: Diagnosis not present

## 2019-01-23 DIAGNOSIS — I4891 Unspecified atrial fibrillation: Secondary | ICD-10-CM | POA: Diagnosis not present

## 2019-01-23 DIAGNOSIS — F0390 Unspecified dementia without behavioral disturbance: Secondary | ICD-10-CM | POA: Diagnosis not present

## 2019-01-23 DIAGNOSIS — L89890 Pressure ulcer of other site, unstageable: Secondary | ICD-10-CM | POA: Diagnosis not present

## 2019-01-23 DIAGNOSIS — L89610 Pressure ulcer of right heel, unstageable: Secondary | ICD-10-CM | POA: Diagnosis not present

## 2019-01-23 DIAGNOSIS — M069 Rheumatoid arthritis, unspecified: Secondary | ICD-10-CM | POA: Diagnosis not present

## 2019-01-23 DIAGNOSIS — L89154 Pressure ulcer of sacral region, stage 4: Secondary | ICD-10-CM | POA: Diagnosis not present

## 2019-01-23 DIAGNOSIS — L89613 Pressure ulcer of right heel, stage 3: Secondary | ICD-10-CM | POA: Diagnosis not present

## 2019-01-23 DIAGNOSIS — I5040 Unspecified combined systolic (congestive) and diastolic (congestive) heart failure: Secondary | ICD-10-CM | POA: Diagnosis not present

## 2019-01-25 ENCOUNTER — Other Ambulatory Visit: Payer: Self-pay | Admitting: Adult Health

## 2019-01-25 ENCOUNTER — Other Ambulatory Visit: Payer: Self-pay | Admitting: *Deleted

## 2019-01-25 MED ORDER — HYDROCODONE-ACETAMINOPHEN 5-325 MG PO TABS: 2.0000 | ORAL_TABLET | ORAL | 0 refills | Status: DC | PRN

## 2019-01-25 NOTE — Patient Outreach (Signed)
[facility]) Care Management  01/25/2019  [name] December 24, 1927 [id number]    Collaboration with [place] and chart reviewed. [name] is currently at [facility] under long term care and hospice services.   Will plan to sign off. There are no identifiable [facility] Care Management needs at this time.   Marthenia Rolling, MSN-Ed, RN,BSN [place] Acute Care Coordinator [phone]

## 2019-01-26 ENCOUNTER — Other Ambulatory Visit: Payer: Self-pay | Admitting: Adult Health

## 2019-01-26 DIAGNOSIS — I5040 Unspecified combined systolic (congestive) and diastolic (congestive) heart failure: Secondary | ICD-10-CM | POA: Diagnosis not present

## 2019-01-26 DIAGNOSIS — M069 Rheumatoid arthritis, unspecified: Secondary | ICD-10-CM | POA: Diagnosis not present

## 2019-01-26 DIAGNOSIS — S32000S Wedge compression fracture of unspecified lumbar vertebra, sequela: Secondary | ICD-10-CM | POA: Diagnosis not present

## 2019-01-26 DIAGNOSIS — L89610 Pressure ulcer of right heel, unstageable: Secondary | ICD-10-CM | POA: Diagnosis not present

## 2019-01-26 DIAGNOSIS — I4891 Unspecified atrial fibrillation: Secondary | ICD-10-CM | POA: Diagnosis not present

## 2019-01-26 DIAGNOSIS — F0390 Unspecified dementia without behavioral disturbance: Secondary | ICD-10-CM | POA: Diagnosis not present

## 2019-01-26 MED ORDER — LORAZEPAM 0.5 MG PO TABS: 0.5000 mg | ORAL_TABLET | Freq: Every day | ORAL | 0 refills | Status: DC

## 2019-01-27 ENCOUNTER — Encounter: Payer: Self-pay | Admitting: Internal Medicine

## 2019-01-27 DIAGNOSIS — B370 Candidal stomatitis: Secondary | ICD-10-CM | POA: Insufficient documentation

## 2019-01-27 DIAGNOSIS — G629 Polyneuropathy, unspecified: Secondary | ICD-10-CM | POA: Insufficient documentation

## 2019-01-30 DIAGNOSIS — I4891 Unspecified atrial fibrillation: Secondary | ICD-10-CM | POA: Diagnosis not present

## 2019-01-30 DIAGNOSIS — L89613 Pressure ulcer of right heel, stage 3: Secondary | ICD-10-CM | POA: Diagnosis not present

## 2019-01-30 DIAGNOSIS — M069 Rheumatoid arthritis, unspecified: Secondary | ICD-10-CM | POA: Diagnosis not present

## 2019-01-30 DIAGNOSIS — S32000S Wedge compression fracture of unspecified lumbar vertebra, sequela: Secondary | ICD-10-CM | POA: Diagnosis not present

## 2019-01-30 DIAGNOSIS — L89890 Pressure ulcer of other site, unstageable: Secondary | ICD-10-CM | POA: Diagnosis not present

## 2019-01-30 DIAGNOSIS — I5040 Unspecified combined systolic (congestive) and diastolic (congestive) heart failure: Secondary | ICD-10-CM | POA: Diagnosis not present

## 2019-01-30 DIAGNOSIS — F0390 Unspecified dementia without behavioral disturbance: Secondary | ICD-10-CM | POA: Diagnosis not present

## 2019-01-30 DIAGNOSIS — L89610 Pressure ulcer of right heel, unstageable: Secondary | ICD-10-CM | POA: Diagnosis not present

## 2019-01-30 DIAGNOSIS — L89154 Pressure ulcer of sacral region, stage 4: Secondary | ICD-10-CM | POA: Diagnosis not present

## 2019-01-31 ENCOUNTER — Non-Acute Institutional Stay (SKILLED_NURSING_FACILITY): Payer: Medicare Other | Admitting: Adult Health

## 2019-01-31 ENCOUNTER — Other Ambulatory Visit: Payer: Self-pay | Admitting: Adult Health

## 2019-01-31 ENCOUNTER — Encounter: Payer: Self-pay | Admitting: Adult Health

## 2019-01-31 DIAGNOSIS — F064 Anxiety disorder due to known physiological condition: Secondary | ICD-10-CM

## 2019-01-31 MED ORDER — LORAZEPAM 0.5 MG PO TABS: 0.5000 mg | ORAL_TABLET | Freq: Three times a day (TID) | ORAL | 0 refills | Status: DC

## 2019-01-31 NOTE — Progress Notes (Signed)
Location:    [place] Room Number: 280K Place of Service:  SNF (31)   CODE STATUS: dnr  Allergies  Allergen Reactions  . Codeine Nausea Only    Chief Complaint  Patient presents with  . Acute Visit    Anxiety    HPI:  Hospice nurse is concerned about her level of anxiety. She is taking ativan routinely at night with some relief of her anxiety. She is yelling out frequently; ringing her call light all the time; banging her call bell on the table. She is unable to rest during the day due to her anxiety.   Past Medical History:  Diagnosis Date  . Arthritis    severe  . Atrial fibrillation ([place])   . Chronic diastolic CHF (congestive heart failure) ([place])   . Chronic insomnia   . Degenerative arthritis   . Essential hypertension   . Hypercholesteremia   . Memory deficit 05/24/2013  . Osteoporosis   . PAF (paroxysmal atrial fibrillation) ([place])    a. Intolerant to amiodarone, discontinued October 2012.  Marland Kitchen RLS (restless legs syndrome) 08/11/2016  . Sinus bradycardia   . Stroke [facility]) 2007   mini stroke or TIA with aphasia     Past Surgical History:  Procedure Laterality Date  . CATARACT EXTRACTION  2009   ou  . FEMUR FRACTURE SURGERY  8/12  . left knee  2001  . REPLACEMENT TOTAL KNEE BILATERAL Right 1999  . right intertrochanteric hip fracture status post ORIF  August 2012  . right shoulder replacement  2009    Social History   Socioeconomic History  . Marital status: Widowed    Spouse name: Not on file  . Number of children: 2  . Years of education: MA  . Highest education level: Not on file  Occupational History  . Occupation: RETIRED  Social Needs  . Financial resource strain: Not on file  . Food insecurity:    Worry: Not on file    Inability: Not on file  . Transportation needs:    Medical: Not on file    Non-medical: Not on file  Tobacco Use  . Smoking status: Never Smoker  . Smokeless tobacco: Never Used   Substance and Sexual Activity  . Alcohol use: Yes    Alcohol/week: 7.0 standard drinks    Types: 7 Glasses of wine per week    Comment: 1 glass wine with dinner some days  . Drug use: No  . Sexual activity: Not on file  Lifestyle  . Physical activity:    Days per week: Not on file    Minutes per session: Not on file  . Stress: Not on file  Relationships  . Social connections:    Talks on phone: Not on file    Gets together: Not on file    Attends religious service: Not on file    Active member of club or organization: Not on file    Attends meetings of clubs or organizations: Not on file    Relationship status: Not on file  . Intimate partner violence:    Fear of current or ex partner: Not on file    Emotionally abused: Not on file    Physically abused: Not on file    Forced sexual activity: Not on file  Other Topics Concern  . Not on file  Social History Narrative   Patient drinks 1 cup of coffee daily.   Patient is right handed.  Family History  Problem Relation Age of Onset  . Dementia Mother   . Stroke Mother   . Cancer - Colon Father   . Cancer Father   . Cancer Sister   . Cancer Son   . Cancer Sister   . Coronary artery disease Other       VITAL SIGNS BP (!) 113/59   Pulse (!) 55   Temp 97.9 F (36.6 C)   Resp 20   Ht 4\' 10"  (1.473 m)   Wt 98 lb (44.5 kg)   BMI 20.48 kg/m   Outpatient Encounter Medications as of 01/31/2019  Medication Sig  . acetaminophen (TYLENOL) 500 MG tablet Take 500 mg by mouth 2 (two) times daily as needed for mild pain.  Marland Kitchen amiodarone (PACERONE) 200 MG tablet Take 1 tablet (200 mg total) by mouth daily.  . clotrimazole (MYCELEX) 10 MG troche Take 1 tablet (10 mg total) by mouth 5 (five) times daily.  . diclofenac sodium (VOLTAREN) 1 % GEL Apply 2 g topically 2 (two) times daily.   . Eszopiclone 3 MG TABS Take 3 mg by mouth at bedtime as needed (sleep). Take immediately before bedtime   . feeding supplement, ENSURE ENLIVE,  (ENSURE ENLIVE) LIQD Take 237 mLs by mouth 2 (two) times daily between meals.  . fentaNYL (DURAGESIC) 50 MCG/HR Place 1 patch onto the skin every 3 (three) days.  . furosemide (LASIX) 40 MG tablet Take 0.5 tablets (20 mg total) by mouth every other day.  . gabapentin (NEURONTIN) 300 MG capsule Take 300 mg by mouth at bedtime.  . isosorbide mononitrate (IMDUR) 30 MG 24 hr tablet Take 1 tablet (30 mg total) by mouth daily.  Marland Kitchen LORazepam (ATIVAN) 0.5 MG tablet Take 0.5 mg by mouth at bedtime.  . Melatonin 3 MG TABS Take 3 tablets (9 mg total) by mouth at bedtime as needed (for sleep).  . metoprolol succinate (TOPROL-XL) 25 MG 24 hr tablet Take 0.5 tablets (12.5 mg total) by mouth daily.  . Multiple Vitamins-Minerals (ICAPS AREDS 2 PO) Take 1 capsule by mouth 2 (two) times daily.  . nitroGLYCERIN (NITROSTAT) 0.4 MG SL tablet DISSOLVE 1 TABLET UNDER THE TONGUE EVERY 5 MINUTES AS NEEDED FOR CHEST PAIN (Patient taking differently: Place 0.4 mg under the tongue every 5 (five) minutes as needed. )  . ondansetron (ZOFRAN) 4 MG tablet Take 1 tablet (4 mg total) by mouth daily as needed for nausea or vomiting.  . ondansetron (ZOFRAN) 8 MG tablet Take by mouth. Give 1 tablet by mouth twice daily as needed for nausea or vomiting  . pantoprazole (PROTONIX) 40 MG tablet Take 1 tablet (40 mg total) by mouth daily.  . polyethylene glycol (MIRALAX / GLYCOLAX) packet Take 17 g by mouth daily as needed for mild constipation.   No facility-administered encounter medications on file as of 01/31/2019.      SIGNIFICANT DIAGNOSTIC EXAMS  LABS REVIEWED TODAY:   01-15-19: wbc 12.5; hgb 10.9; hct 35.6; mcv 83.6; plt 252; glucose 100; bun 70; creat 1.68; k+ 4.5; na++137; ca 8.5    Review of Systems  Constitutional: Negative for malaise/fatigue.  Respiratory: Negative for cough and shortness of breath.   Cardiovascular: Negative for chest pain, palpitations and leg swelling.  Gastrointestinal: Negative for abdominal  pain, constipation and heartburn.  Musculoskeletal: Negative for back pain, joint pain and myalgias.  Skin:       sores  Neurological: Negative for dizziness.  Psychiatric/Behavioral: Positive for depression. Negative for suicidal ideas. The  patient is nervous/anxious.     Physical Exam Constitutional:      General: She is not in acute distress.    Appearance: She is well-developed. She is not diaphoretic.     Comments: Frail   Neck:     Musculoskeletal: Neck supple.     Thyroid: No thyromegaly.  Cardiovascular:     Rate and Rhythm: Normal rate and regular rhythm.     Pulses: Normal pulses.     Heart sounds: Normal heart sounds.  Pulmonary:     Effort: Pulmonary effort is normal. No respiratory distress.     Breath sounds: Normal breath sounds.  Abdominal:     General: Bowel sounds are normal. There is no distension.     Palpations: Abdomen is soft.     Tenderness: There is no abdominal tenderness.  Musculoskeletal: Normal range of motion.  Lymphadenopathy:     Cervical: No cervical adenopathy.  Skin:    General: Skin is warm and dry.     Comments: Stage IV sacral ulceration: 11.0 x 12.0 x 0.8 cm yellow/black slough present Stage III right heel: 3.0 x 3.0 x 0.2 cm serous drainage   Neurological:     Mental Status: She is alert. Mental status is at baseline.  Psychiatric:        Mood and Affect: Mood normal.      ASSESSMENT/ PLAN:  TODAY:   1. Anxiety disorder due to general medical condition: is worse will change ativan to 0.5 mg three times daily and will monitor her status.   MD is aware of resident's narcotic use and is in agreement with current plan of care. We will attempt to wean resident as apropriate   Ok Edwards NP [facility]  Contact [phone] Monday through Friday 8am- 5pm  After hours call [phone]

## 2019-02-01 DIAGNOSIS — G894 Chronic pain syndrome: Secondary | ICD-10-CM | POA: Diagnosis not present

## 2019-02-01 DIAGNOSIS — M810 Age-related osteoporosis without current pathological fracture: Secondary | ICD-10-CM | POA: Diagnosis not present

## 2019-02-01 DIAGNOSIS — G459 Transient cerebral ischemic attack, unspecified: Secondary | ICD-10-CM | POA: Diagnosis not present

## 2019-02-01 DIAGNOSIS — I4891 Unspecified atrial fibrillation: Secondary | ICD-10-CM | POA: Diagnosis not present

## 2019-02-01 DIAGNOSIS — L89610 Pressure ulcer of right heel, unstageable: Secondary | ICD-10-CM | POA: Diagnosis not present

## 2019-02-01 DIAGNOSIS — F0390 Unspecified dementia without behavioral disturbance: Secondary | ICD-10-CM | POA: Diagnosis not present

## 2019-02-01 DIAGNOSIS — H3530 Unspecified macular degeneration: Secondary | ICD-10-CM | POA: Diagnosis not present

## 2019-02-01 DIAGNOSIS — S32000S Wedge compression fracture of unspecified lumbar vertebra, sequela: Secondary | ICD-10-CM | POA: Diagnosis not present

## 2019-02-01 DIAGNOSIS — G2581 Restless legs syndrome: Secondary | ICD-10-CM | POA: Diagnosis not present

## 2019-02-01 DIAGNOSIS — I5040 Unspecified combined systolic (congestive) and diastolic (congestive) heart failure: Secondary | ICD-10-CM | POA: Diagnosis not present

## 2019-02-01 DIAGNOSIS — M069 Rheumatoid arthritis, unspecified: Secondary | ICD-10-CM | POA: Diagnosis not present

## 2019-02-02 DIAGNOSIS — F064 Anxiety disorder due to known physiological condition: Secondary | ICD-10-CM | POA: Insufficient documentation

## 2019-02-06 DIAGNOSIS — L89890 Pressure ulcer of other site, unstageable: Secondary | ICD-10-CM | POA: Diagnosis not present

## 2019-02-06 DIAGNOSIS — L89154 Pressure ulcer of sacral region, stage 4: Secondary | ICD-10-CM | POA: Diagnosis not present

## 2019-02-06 DIAGNOSIS — L89613 Pressure ulcer of right heel, stage 3: Secondary | ICD-10-CM | POA: Diagnosis not present

## 2019-02-09 ENCOUNTER — Other Ambulatory Visit: Payer: Self-pay

## 2019-02-09 MED ORDER — HYDROCODONE-ACETAMINOPHEN 5-325 MG PO TABS: 2.0000 | ORAL_TABLET | ORAL | 0 refills | Status: DC | PRN

## 2019-02-13 DIAGNOSIS — L89610 Pressure ulcer of right heel, unstageable: Secondary | ICD-10-CM | POA: Diagnosis not present

## 2019-02-13 DIAGNOSIS — I5040 Unspecified combined systolic (congestive) and diastolic (congestive) heart failure: Secondary | ICD-10-CM | POA: Diagnosis not present

## 2019-02-13 DIAGNOSIS — M069 Rheumatoid arthritis, unspecified: Secondary | ICD-10-CM | POA: Diagnosis not present

## 2019-02-13 DIAGNOSIS — I4891 Unspecified atrial fibrillation: Secondary | ICD-10-CM | POA: Diagnosis not present

## 2019-02-13 DIAGNOSIS — L89890 Pressure ulcer of other site, unstageable: Secondary | ICD-10-CM | POA: Diagnosis not present

## 2019-02-13 DIAGNOSIS — S32000S Wedge compression fracture of unspecified lumbar vertebra, sequela: Secondary | ICD-10-CM | POA: Diagnosis not present

## 2019-02-13 DIAGNOSIS — F0390 Unspecified dementia without behavioral disturbance: Secondary | ICD-10-CM | POA: Diagnosis not present

## 2019-02-13 DIAGNOSIS — L89154 Pressure ulcer of sacral region, stage 4: Secondary | ICD-10-CM | POA: Diagnosis not present

## 2019-02-13 DIAGNOSIS — L89613 Pressure ulcer of right heel, stage 3: Secondary | ICD-10-CM | POA: Diagnosis not present

## 2019-02-14 DIAGNOSIS — I4891 Unspecified atrial fibrillation: Secondary | ICD-10-CM | POA: Diagnosis not present

## 2019-02-14 DIAGNOSIS — M069 Rheumatoid arthritis, unspecified: Secondary | ICD-10-CM | POA: Diagnosis not present

## 2019-02-14 DIAGNOSIS — F0390 Unspecified dementia without behavioral disturbance: Secondary | ICD-10-CM | POA: Diagnosis not present

## 2019-02-14 DIAGNOSIS — I5040 Unspecified combined systolic (congestive) and diastolic (congestive) heart failure: Secondary | ICD-10-CM | POA: Diagnosis not present

## 2019-02-14 DIAGNOSIS — L89610 Pressure ulcer of right heel, unstageable: Secondary | ICD-10-CM | POA: Diagnosis not present

## 2019-02-14 DIAGNOSIS — S32000S Wedge compression fracture of unspecified lumbar vertebra, sequela: Secondary | ICD-10-CM | POA: Diagnosis not present

## 2019-02-15 ENCOUNTER — Encounter: Payer: Self-pay | Admitting: Internal Medicine

## 2019-02-15 NOTE — Progress Notes (Signed)
:   Location:  [facility] Room Number: 062I Place of Service:  SNF (31)  Anne D. Sheppard Coil, MD  Patient Care Team: Lajean Manes, MD as PCP - General (Internal Medicine) Evans Lance, MD as PCP - Cardiology (Cardiology) Burtis Junes, NP as Nurse Practitioner (Nurse Practitioner) Duffy, Creola Corn, LCSW as Social Worker (Licensed Clinical Social Worker)  Extended Emergency Contact Information Primary Emergency Contact: Garringer,Timothy Address: [address] [place] Phone: [phone] Mobile Phone: [phone] Relation: Son     Allergies: Codeine  Chief Complaint  Patient presents with  . Acute Visit    HPI: Patient is 85 y.o. female who  Past Medical History:  Diagnosis Date  . Arthritis    severe  . Atrial fibrillation ([place])   . Chronic diastolic CHF (congestive heart failure) ([place])   . Chronic insomnia   . Degenerative arthritis   . Essential hypertension   . Hypercholesteremia   . Memory deficit 05/24/2013  . Osteoporosis   . PAF (paroxysmal atrial fibrillation) ([place])    a. Intolerant to amiodarone, discontinued October 2012.  Marland Kitchen RLS (restless legs syndrome) 08/11/2016  . Sinus bradycardia   . Stroke [facility]) 2007   mini stroke or TIA with aphasia     Past Surgical History:  Procedure Laterality Date  . CATARACT EXTRACTION  2009   ou  . FEMUR FRACTURE SURGERY  8/12  . left knee  2001  . REPLACEMENT TOTAL KNEE BILATERAL Right 1999  . right intertrochanteric hip fracture status post ORIF  August 2012  . right shoulder replacement  2009    Allergies as of 02/15/2019      Reactions   Codeine Nausea Only      Medication List       Accurate as of February 15, 2019  3:09 PM. Always use your most recent med list.        acetaminophen 500 MG tablet Commonly known as:  TYLENOL Take 500 mg by mouth 2 (two) times daily as needed for mild pain.   amiodarone 200 MG tablet Commonly known as:   PACERONE Take 1 tablet (200 mg total) by mouth daily.   artificial tears ointment as needed. APPLY 1/4 INCHTO CONJUNCITVA SAC OF BILAT EYES AT HS FOR DRY EYES   diclofenac sodium 1 % Gel Commonly known as:  VOLTAREN Apply 2 g topically 2 (two) times daily.   eszopiclone 1 MG Tabs tablet Commonly known as:  LUNESTA Take 1 mg by mouth at bedtime as needed for sleep. Take immediately before bedtime   feeding supplement (ENSURE ENLIVE) Liqd Take 237 mLs by mouth 2 (two) times daily between meals.   fentaNYL 50 MCG/HR Commonly known as:  [place] 1 patch onto the skin every 3 (three) days.   furosemide 40 MG tablet Commonly known as:  LASIX Take 0.5 tablets (20 mg total) by mouth every other day.   gabapentin 300 MG capsule Commonly known as:  NEURONTIN Take 300 mg by mouth at bedtime.   HYDROcodone-acetaminophen 5-325 MG tablet Commonly known as:  NORCO/VICODIN Take 2 tablets by mouth every 4 (four) hours as needed for moderate pain.   ICAPS AREDS 2 PO Take 1 capsule by mouth 2 (two) times daily.   isosorbide mononitrate 30 MG 24 hr tablet Commonly known as:  IMDUR Take 1 tablet (30 mg total) by mouth daily.   LORazepam 0.5 MG tablet Commonly known as:  ATIVAN Take 0.5  mg by mouth 2 (two) times daily.   Melatonin 3 MG Tabs Take 3 tablets (9 mg total) by mouth at bedtime as needed (for sleep).   metoprolol succinate 25 MG 24 hr tablet Commonly known as:  TOPROL-XL Take 0.5 tablets (12.5 mg total) by mouth daily.   nitroGLYCERIN 0.4 MG SL tablet Commonly known as:  NITROSTAT Place 0.4 mg under the tongue. Dissolve 1 tablet under the tongue every 5 minutes as needed for chest pain x 3 doses if no reilef notify MD   ondansetron 8 MG tablet Commonly known as:  ZOFRAN Take by mouth. Give 1 tablet by mouth twice daily as needed for nausea or vomiting   ondansetron 4 MG tablet Commonly known as:  Zofran Take 1 tablet (4 mg total) by mouth daily as needed for  nausea or vomiting.   pantoprazole 40 MG tablet Commonly known as:  Protonix Take 1 tablet (40 mg total) by mouth daily.   polyethylene glycol 17 g packet Commonly known as:  MIRALAX / GLYCOLAX Take 17 g by mouth daily as needed for mild constipation.   SEROquel 25 MG tablet Generic drug:  QUEtiapine Take 25 mg by mouth. TAKE 1 TABLET BY MOUTH BID AT 2PM AND 9PM FOR HALLUCINATIONS       No orders of the defined types were placed in this encounter.    There is no immunization history on file for this patient.  Social History   Tobacco Use  . Smoking status: Never Smoker  . Smokeless tobacco: Never Used  Substance Use Topics  . Alcohol use: Yes    Alcohol/week: 7.0 standard drinks    Types: 7 Glasses of wine per week    Comment: 1 glass wine with dinner some days    Family history is   Family History  Problem Relation Age of Onset  . Dementia Mother   . Stroke Mother   . Cancer - Colon Father   . Cancer Father   . Cancer Sister   . Cancer Son   . Cancer Sister   . Coronary artery disease Other       Review of Systems  DATA OBTAINED: from patient, nurse, medical record, family member GENERAL:  no fevers, fatigue, appetite changes SKIN: No itching, or rash EYES: No eye pain, redness, discharge EARS: No earache, tinnitus, change in hearing NOSE: No congestion, drainage or bleeding  MOUTH/THROAT: No mouth or tooth pain, No sore throat RESPIRATORY: No cough, wheezing, SOB CARDIAC: No chest pain, palpitations, lower extremity edema  GI: No abdominal pain, No N/V/D or constipation, No heartburn or reflux  GU: No dysuria, frequency or urgency, or incontinence  MUSCULOSKELETAL: No unrelieved bone/joint pain NEUROLOGIC: No headache, dizziness or focal weakness PSYCHIATRIC: No c/o anxiety or sadness   Vitals:   02/15/19 1449  BP: (!) 100/58  Pulse: 62  Resp: 16  Temp: (!) 97.3 F (36.3 C)    SpO2 Readings from Last 1 Encounters:  01/17/19 95%   Body  mass index is 23.12 kg/m.     Physical Exam  GENERAL APPEARANCE: Alert, conversant,  No acute distress.  SKIN: No diaphoresis rash HEAD: Normocephalic, atraumatic  EYES: Conjunctiva/lids clear. Pupils round, reactive. EOMs intact.  EARS: External exam WNL, canals clear. Hearing grossly normal.  NOSE: No deformity or discharge.  MOUTH/THROAT: Lips w/o lesions  RESPIRATORY: Breathing is even, unlabored. Lung sounds are clear   CARDIOVASCULAR: Heart RRR no murmurs, rubs or gallops. No peripheral edema.   GASTROINTESTINAL: Abdomen  is soft, non-tender, not distended w/ normal bowel sounds. GENITOURINARY: Bladder non tender, not distended  MUSCULOSKELETAL: No abnormal joints or musculature NEUROLOGIC:  Cranial nerves 2-12 grossly intact. Moves all extremities  PSYCHIATRIC: Mood and affect appropriate to situation, no behavioral issues  Patient Active Problem List   Diagnosis Date Noted  . Anxiety disorder due to general medical condition 02/02/2019  . Oral thrush 01/27/2019  . Polyneuropathy 01/27/2019  . Sacral decubitus ulcer, stage IV ([place]) 01/13/2019  . CKD (chronic kidney disease), stage IV ([place]) 01/13/2019  . Dehydration 01/13/2019  . Hypotension 01/13/2019  . Anemia in other chronic diseases classified elsewhere 12/21/2018  . Severe anemia 12/21/2018  . Atypical atrial flutter ([place])   . CHF (congestive heart failure) ([place]) 04/06/2018  . Hyponatremia 04/06/2018  . Acute respiratory failure ([place]) 04/06/2018  . Normocytic normochromic anemia 04/06/2018  . RLS (restless legs syndrome) 08/11/2016  . Demand ischemia ([place]) 05/27/2016  . Acute on chronic diastolic congestive heart failure ([place]) 05/26/2016  . Atrial fibrillation with RVR ([place]) 05/26/2016  . Insomnia 01/22/2016  . Sinus bradycardia   . Chronic combined systolic and diastolic congestive heart failure ([place])   . Essential hypertension   . PAF (paroxysmal atrial fibrillation) ([place])   . Stroke ([place])   . Memory  deficit 05/24/2013  . Arthritis 10/13/2011  . Long term (current) use of anticoagulants 07/21/2011  . Dyslipidemia 07/10/2011      Labs reviewed: Basic Metabolic Panel:    Component Value Date/Time   NA 137 01/15/2019 1105   NA 136 05/23/2018 1241   K 4.5 01/15/2019 1105   CL 106 01/15/2019 1105   CO2 23 01/15/2019 1105   GLUCOSE 100 (H) 01/15/2019 1105   BUN 70 (H) 01/15/2019 1105   BUN 27 05/23/2018 1241   CREATININE 1.68 (H) 01/15/2019 1105   CREATININE 1.06 (H) 06/29/2016 1606   CALCIUM 8.5 (L) 01/15/2019 1105   PROT 4.9 (L) 01/14/2019 0354   PROT 6.7 10/13/2017 1619   ALBUMIN 2.0 (L) 01/14/2019 0354   ALBUMIN 4.0 10/13/2017 1619   AST 29 01/14/2019 0354   ALT 33 01/14/2019 0354   ALKPHOS 83 01/14/2019 0354   BILITOT 0.4 01/14/2019 0354   BILITOT 0.2 10/13/2017 1619   GFRNONAA 27 (L) 01/15/2019 1105   GFRAA 31 (L) 01/15/2019 1105    Recent Labs    04/11/18 0534  01/13/19 1430 01/14/19 0354 01/15/19 1105  NA 137   < > 136 139 137  K 4.4   < > 4.4 4.5 4.5  CL 83*   < > 100 108 106  CO2 43*   < > 26 25 23   GLUCOSE 104*   < > 90 89 100*  BUN 34*   < > 94* 82* 70*  CREATININE 1.14*   < > 2.03* 1.89* 1.68*  CALCIUM 9.9   < > 8.6* 8.5* 8.5*  MG 1.9  --   --  2.2 2.2   < > = values in this interval not displayed.   Liver Function Tests: Recent Labs    12/21/18 1342 01/13/19 1430 01/14/19 0354  AST 39 33 29  ALT 41 35 33  ALKPHOS 62 93 83  BILITOT 0.6 0.6 0.4  PROT 5.2* 5.5* 4.9*  ALBUMIN 2.5* 2.3* 2.0*   Recent Labs    12/21/18 1342  LIPASE 27   No results for input(s): AMMONIA in the last 8760 hours. CBC: Recent Labs    04/07/18 0414 04/08/18 0657  12/21/18 1342  01/13/19 1430 01/14/19 0354 01/15/19 1105  WBC 6.0 5.3   < > 18.8*   < > 6.1 6.1 12.5*  NEUTROABS 4.5 3.8  --  17.4*  --   --   --   --   HGB 10.8* 10.7*   < > 4.6*   < > 7.2* 6.9* 10.9*  HCT 34.4* 33.8*   < > 17.1*   < > 24.4* 24.4* 35.6*  MCV 95.8 94.2   < > 78.1*   < >  78.2* 81.3 83.6  PLT 147* 146*   < > 257   < > 269 266 252   < > = values in this interval not displayed.   Lipid No results for input(s): CHOL, HDL, LDLCALC, TRIG in the last 8760 hours.  Cardiac Enzymes: Recent Labs    04/06/18 0520 04/06/18 1212 04/06/18 1650  TROPONINI 0.03* 0.03* 0.03*   BNP: Recent Labs    04/06/18 0240 12/21/18 1344  BNP 388.3* 307.7*   No results found for: MICROALBUR No results found for: HGBA1C Lab Results  Component Value Date   TSH 0.837 04/06/2018   No results found for: VITAMINB12 No results found for: FOLATE No results found for: IRON, TIBC, FERRITIN  Imaging and Procedures obtained prior to SNF admission: No results found.   Not all labs, radiology exams or other studies done during hospitalization come through on my EPIC note; however they are reviewed by me.    Assessment and Plan  No problem-specific Assessment & Plan notes found for this encounter.   Noah Delaine. Sheppard Coil, MD

## 2019-02-16 ENCOUNTER — Other Ambulatory Visit: Payer: Self-pay | Admitting: Internal Medicine

## 2019-02-16 MED ORDER — HYDROCODONE-ACETAMINOPHEN 5-325 MG PO TABS: 2.0000 | ORAL_TABLET | Freq: Every day | ORAL | 0 refills | Status: AC

## 2019-02-17 ENCOUNTER — Non-Acute Institutional Stay (SKILLED_NURSING_FACILITY): Payer: Medicare Other | Admitting: Internal Medicine

## 2019-02-17 DIAGNOSIS — R609 Edema, unspecified: Secondary | ICD-10-CM

## 2019-02-17 DIAGNOSIS — R635 Abnormal weight gain: Secondary | ICD-10-CM

## 2019-02-18 NOTE — Progress Notes (Signed)
This encounter was created in error - please disregard.

## 2019-02-19 ENCOUNTER — Encounter: Payer: Self-pay | Admitting: Internal Medicine

## 2019-02-19 NOTE — Progress Notes (Signed)
Location:   [facility] of Service:   SNF Provider: Hennie Duos MD  Lajean Manes, MD  Patient Care Team: Lajean Manes, MD as PCP - General (Internal Medicine) Evans Lance, MD as PCP - Cardiology (Cardiology) Burtis Junes, NP as Nurse Practitioner (Nurse Practitioner) Duffy, Creola Corn, LCSW as Social Worker (Licensed Clinical Social Worker)  Extended Emergency Contact Information Primary Emergency Contact: Koker,Timothy Address: [place] Phone: [phone] Mobile Phone: [phone] Relation: Son    Allergies: Codeine  Chief Complaint  Patient presents with  . Acute Visit    HPI: Patient is 85 y.o. female who is a hospice patient but who is being seen because she has gained 8 pounds in 1 week and she has noticeable edema including presacral edema.  Patient's weight on admission was 105 pounds and her current weight is 119.  Patient denies shortness of breath.  Patient is currently on Lasix 20 mg daily  Past Medical History:  Diagnosis Date  . Arthritis    severe  . Atrial fibrillation ([place])   . Chronic diastolic CHF (congestive heart failure) ([place])   . Chronic insomnia   . Degenerative arthritis   . Essential hypertension   . Hypercholesteremia   . Memory deficit 05/24/2013  . Osteoporosis   . PAF (paroxysmal atrial fibrillation) ([place])    a. Intolerant to amiodarone, discontinued October 2012.  Marland Kitchen RLS (restless legs syndrome) 08/11/2016  . Sinus bradycardia   . Stroke [facility]) 2007   mini stroke or TIA with aphasia     Past Surgical History:  Procedure Laterality Date  . CATARACT EXTRACTION  2009   ou  . FEMUR FRACTURE SURGERY  8/12  . left knee  2001  . REPLACEMENT TOTAL KNEE BILATERAL Right 1999  . right intertrochanteric hip fracture status post ORIF  August 2012  . right shoulder replacement  2009    Allergies as of 02/17/2019      Reactions   Codeine Nausea Only      Medication List        Accurate as of February 17, 2019 11:59 PM. Always use your most recent med list.        acetaminophen 500 MG tablet Commonly known as:  TYLENOL Take 500 mg by mouth 2 (two) times daily as needed for mild pain.   amiodarone 200 MG tablet Commonly known as:  PACERONE Take 1 tablet (200 mg total) by mouth daily.   artificial tears ointment as needed. APPLY 1/4 INCHTO CONJUNCITVA SAC OF BILAT EYES AT HS FOR DRY EYES   diclofenac sodium 1 % Gel Commonly known as:  VOLTAREN Apply 2 g topically 2 (two) times daily.   eszopiclone 1 MG Tabs tablet Commonly known as:  LUNESTA Take 1 mg by mouth at bedtime as needed for sleep. Take immediately before bedtime   feeding supplement (ENSURE ENLIVE) Liqd Take 237 mLs by mouth 2 (two) times daily between meals.   fentaNYL 50 MCG/HR Commonly known as:  [place] 1 patch onto the skin every 3 (three) days.   furosemide 40 MG tablet Commonly known as:  LASIX Take 0.5 tablets (20 mg total) by mouth every other day.   gabapentin 300 MG capsule Commonly known as:  NEURONTIN Take 300 mg by mouth at bedtime.   HYDROcodone-acetaminophen 5-325 MG tablet Commonly known as:  NORCO/VICODIN Take 2 tablets by mouth daily. 2 po 30' prior to wound care   ICAPS  AREDS 2 PO Take 1 capsule by mouth 2 (two) times daily.   isosorbide mononitrate 30 MG 24 hr tablet Commonly known as:  IMDUR Take 1 tablet (30 mg total) by mouth daily.   LORazepam 0.5 MG tablet Commonly known as:  ATIVAN Take 0.5 mg by mouth 2 (two) times daily.   Melatonin 3 MG Tabs Take 3 tablets (9 mg total) by mouth at bedtime as needed (for sleep).   metoprolol succinate 25 MG 24 hr tablet Commonly known as:  TOPROL-XL Take 0.5 tablets (12.5 mg total) by mouth daily.   nitroGLYCERIN 0.4 MG SL tablet Commonly known as:  NITROSTAT Place 0.4 mg under the tongue. Dissolve 1 tablet under the tongue every 5 minutes as needed for chest pain x 3 doses if no reilef notify MD    ondansetron 8 MG tablet Commonly known as:  ZOFRAN Take by mouth. Give 1 tablet by mouth twice daily as needed for nausea or vomiting   ondansetron 4 MG tablet Commonly known as:  Zofran Take 1 tablet (4 mg total) by mouth daily as needed for nausea or vomiting.   pantoprazole 40 MG tablet Commonly known as:  Protonix Take 1 tablet (40 mg total) by mouth daily.   polyethylene glycol 17 g packet Commonly known as:  MIRALAX / GLYCOLAX Take 17 g by mouth daily as needed for mild constipation.   SEROquel 25 MG tablet Generic drug:  QUEtiapine Take 25 mg by mouth. TAKE 1 TABLET BY MOUTH BID AT 2PM AND 9PM FOR HALLUCINATIONS       No orders of the defined types were placed in this encounter.    There is no immunization history on file for this patient.  Social History   Tobacco Use  . Smoking status: Never Smoker  . Smokeless tobacco: Never Used  Substance Use Topics  . Alcohol use: Yes    Alcohol/week: 7.0 standard drinks    Types: 7 Glasses of wine per week    Comment: 1 glass wine with dinner some days    Review of Systems  DATA OBTAINED: from patient, nurse GENERAL:  no fevers, fatigue, appetite changes SKIN: No itching, rash HEENT: No complaint RESPIRATORY: No cough, wheezing, SOB CARDIAC: No chest pain, palpitations, lower extremity edema  GI: No abdominal pain, No N/V/D or constipation, No heartburn or reflux  GU: No dysuria, frequency or urgency, or incontinence  MUSCULOSKELETAL: No unrelieved bone/joint pain NEUROLOGIC: No headache, dizziness  PSYCHIATRIC: No overt anxiety or sadness  Vitals:   02/19/19 1901  BP: (!) 99/57  Pulse: (!) 57  Resp: 20  Temp: (!) 96.7 F (35.9 C)   There is no height or weight on file to calculate BMI. Physical Exam  GENERAL APPEARANCE: Alert, conversant, No acute distress  SKIN: No diaphoresis rash HEENT: Unremarkable RESPIRATORY: Breathing is even, unlabored. Lung sounds are minimal Rales at bases CARDIOVASCULAR:  Heart RRR no murmurs, rubs or gallops.  1.5 peripheral edema calves, 1+ thigh, 1/2+ presacral GASTROINTESTINAL: Abdomen is soft, non-tender, not distended w/ normal bowel sounds.  GENITOURINARY: Bladder non tender, not distended  MUSCULOSKELETAL: No abnormal joints or musculature NEUROLOGIC: Cranial nerves 2-12 grossly intact. Moves all extremities PSYCHIATRIC: Mood and affect appropriate to situation with dementia, no behavioral issues  Patient Active Problem List   Diagnosis Date Noted  . Anxiety disorder due to general medical condition 02/02/2019  . Oral thrush 01/27/2019  . Polyneuropathy 01/27/2019  . Sacral decubitus ulcer, stage IV ([place]) 01/13/2019  . CKD (chronic  kidney disease), stage IV ([place]) 01/13/2019  . Dehydration 01/13/2019  . Hypotension 01/13/2019  . Anemia in other chronic diseases classified elsewhere 12/21/2018  . Severe anemia 12/21/2018  . Atypical atrial flutter ([place])   . CHF (congestive heart failure) ([place]) 04/06/2018  . Hyponatremia 04/06/2018  . Acute respiratory failure ([place]) 04/06/2018  . Normocytic normochromic anemia 04/06/2018  . RLS (restless legs syndrome) 08/11/2016  . Demand ischemia ([place]) 05/27/2016  . Acute on chronic diastolic congestive heart failure ([place]) 05/26/2016  . Atrial fibrillation with RVR ([place]) 05/26/2016  . Insomnia 01/22/2016  . Sinus bradycardia   . Chronic combined systolic and diastolic congestive heart failure ([place])   . Essential hypertension   . PAF (paroxysmal atrial fibrillation) ([place])   . Stroke ([place])   . Memory deficit 05/24/2013  . Arthritis 10/13/2011  . Long term (current) use of anticoagulants 07/21/2011  . Dyslipidemia 07/10/2011    CMP     Component Value Date/Time   NA 137 01/15/2019 1105   NA 136 05/23/2018 1241   K 4.5 01/15/2019 1105   CL 106 01/15/2019 1105   CO2 23 01/15/2019 1105   GLUCOSE 100 (H) 01/15/2019 1105   BUN 70 (H) 01/15/2019 1105   BUN 27 05/23/2018 1241   CREATININE 1.68 (H)  01/15/2019 1105   CREATININE 1.06 (H) 06/29/2016 1606   CALCIUM 8.5 (L) 01/15/2019 1105   PROT 4.9 (L) 01/14/2019 0354   PROT 6.7 10/13/2017 1619   ALBUMIN 2.0 (L) 01/14/2019 0354   ALBUMIN 4.0 10/13/2017 1619   AST 29 01/14/2019 0354   ALT 33 01/14/2019 0354   ALKPHOS 83 01/14/2019 0354   BILITOT 0.4 01/14/2019 0354   BILITOT 0.2 10/13/2017 1619   GFRNONAA 27 (L) 01/15/2019 1105   GFRAA 31 (L) 01/15/2019 1105   Recent Labs    04/11/18 0534  01/13/19 1430 01/14/19 0354 01/15/19 1105  NA 137   < > 136 139 137  K 4.4   < > 4.4 4.5 4.5  CL 83*   < > 100 108 106  CO2 43*   < > 26 25 23   GLUCOSE 104*   < > 90 89 100*  BUN 34*   < > 94* 82* 70*  CREATININE 1.14*   < > 2.03* 1.89* 1.68*  CALCIUM 9.9   < > 8.6* 8.5* 8.5*  MG 1.9  --   --  2.2 2.2   < > = values in this interval not displayed.   Recent Labs    12/21/18 1342 01/13/19 1430 01/14/19 0354  AST 39 33 29  ALT 41 35 33  ALKPHOS 62 93 83  BILITOT 0.6 0.6 0.4  PROT 5.2* 5.5* 4.9*  ALBUMIN 2.5* 2.3* 2.0*   Recent Labs    04/07/18 0414 04/08/18 0657  12/21/18 1342  01/13/19 1430 01/14/19 0354 01/15/19 1105  WBC 6.0 5.3   < > 18.8*   < > 6.1 6.1 12.5*  NEUTROABS 4.5 3.8  --  17.4*  --   --   --   --   HGB 10.8* 10.7*   < > 4.6*   < > 7.2* 6.9* 10.9*  HCT 34.4* 33.8*   < > 17.1*   < > 24.4* 24.4* 35.6*  MCV 95.8 94.2   < > 78.1*   < > 78.2* 81.3 83.6  PLT 147* 146*   < > 257   < > 269 266 252   < > = values in this interval not displayed.  No results for input(s): CHOL, LDLCALC, TRIG in the last 8760 hours.  Invalid input(s): HCL No results found for: MICROALBUR Lab Results  Component Value Date   TSH 0.837 04/06/2018   No results found for: HGBA1C No results found for: CHOL, HDL, LDLCALC, LDLDIRECT, TRIG, CHOLHDL  Significant Diagnostic Results in last 30 days:  No results found.  Assessment and Plan  Weight gain with edema-we will increase Lasix from 20 mg daily to 40 mg daily with a follow-up  BMP on Monday 4/20    Inocencio Homes, MD

## 2019-03-03 DEATH — deceased

## 2020-08-23 IMAGING — DX DG CHEST 2V
2 series · 2 of 2 positions shown · non-contrast
Comparison: 04/06/2018

CLINICAL DATA: pt coming from [HOSPITAL] for follow up
on medication reaction. Patient having nausea and vomiting for 2
hours. Patient also had a syncopal episode.

EXAM:
CHEST - 2 VIEW

[chest pa]
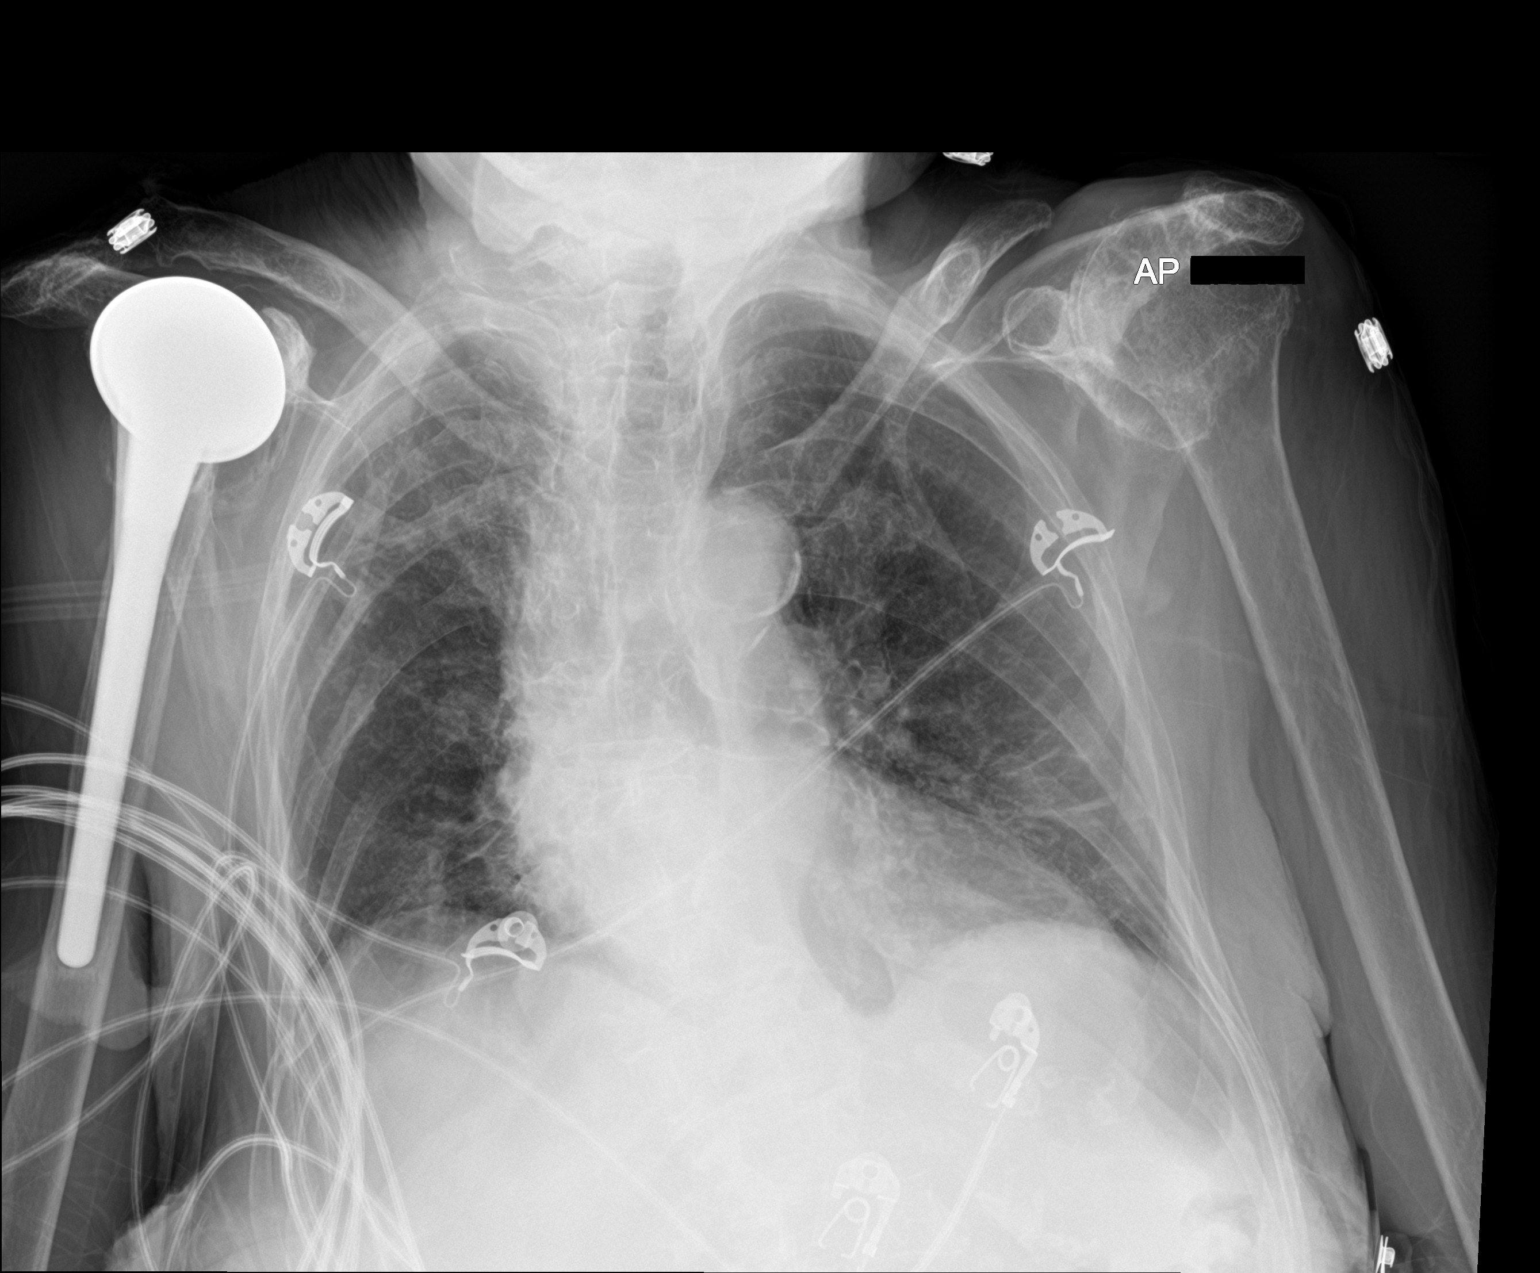

[chest lat]
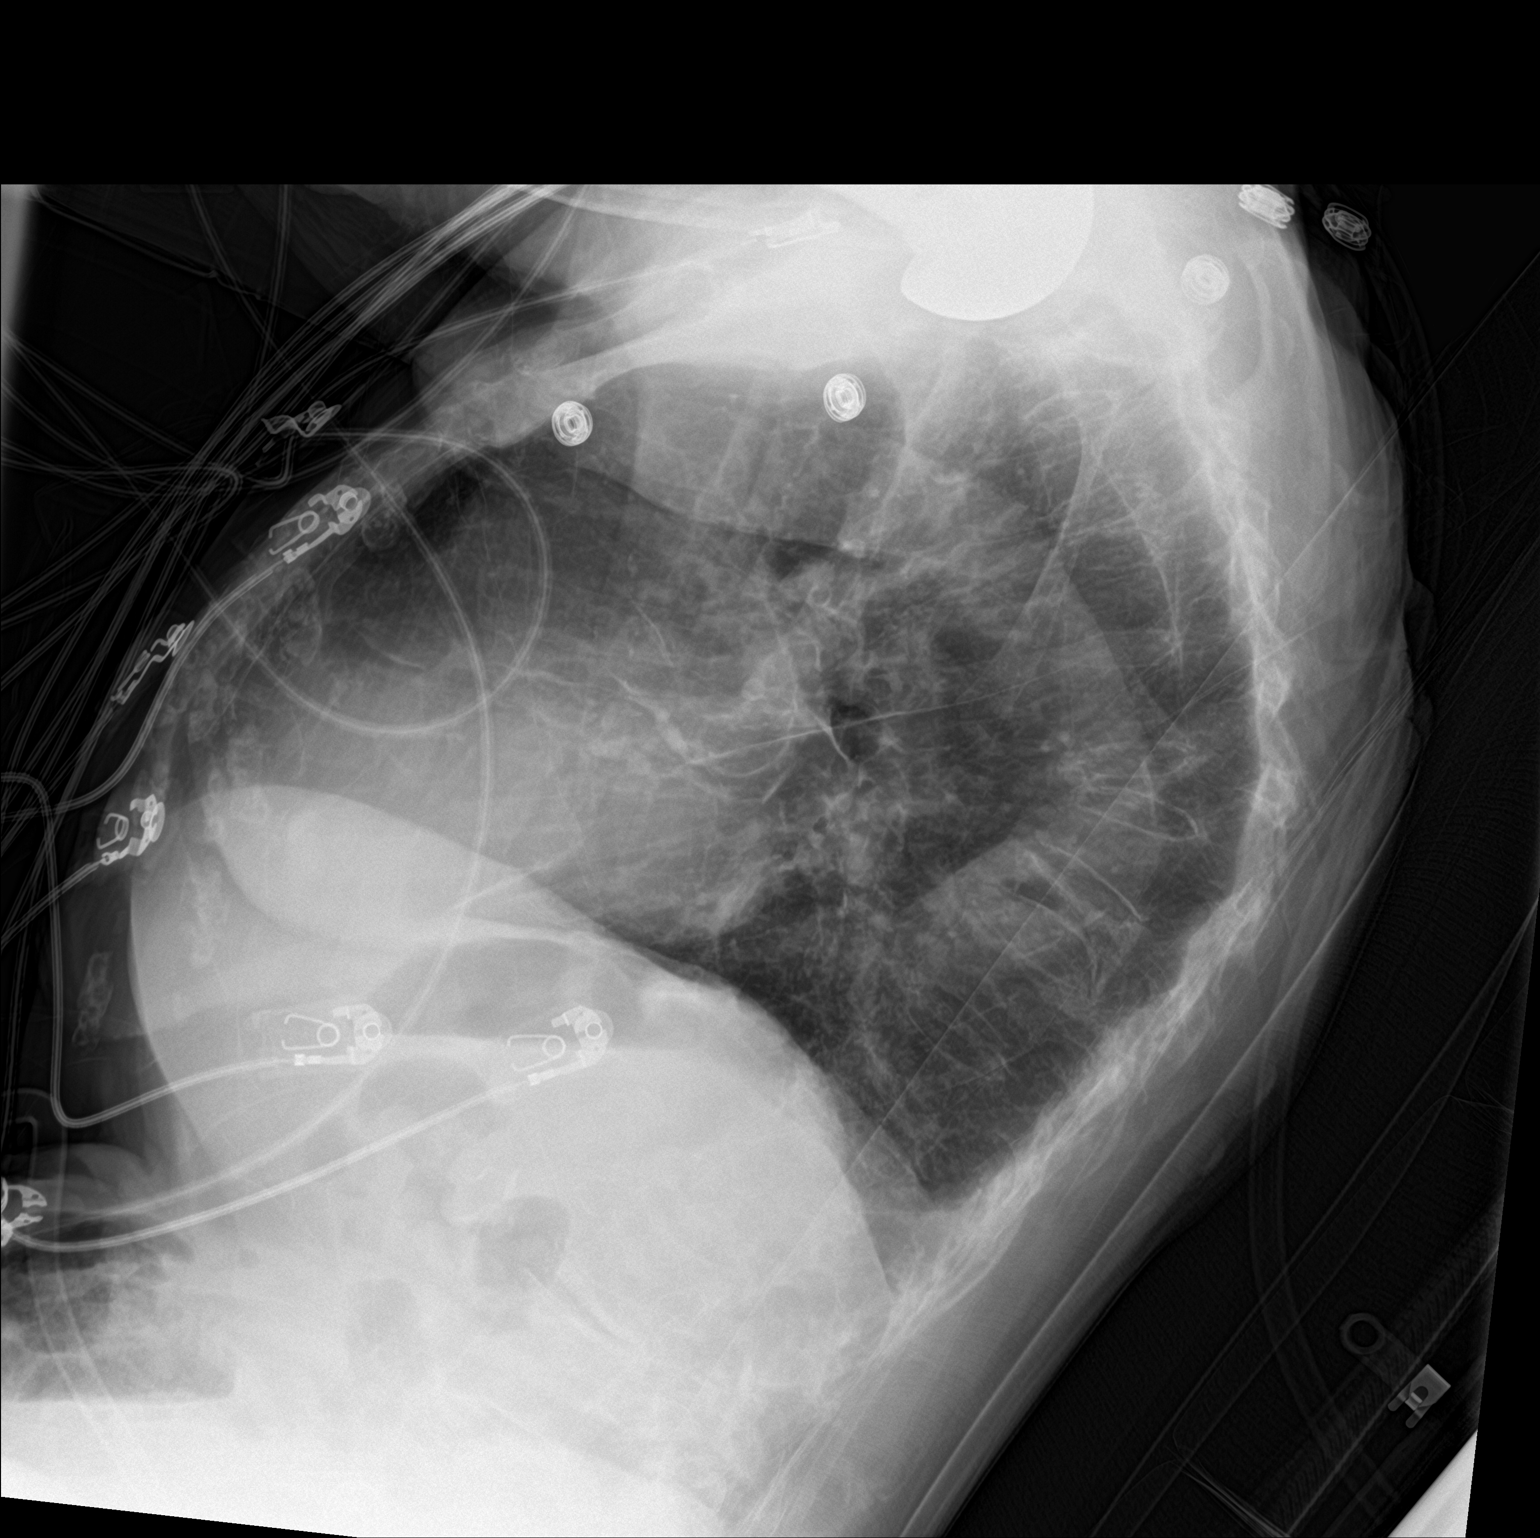

[2 of 2 positions shown; findings below may reference images not displayed]

FINDINGS: Stable linear opacities in the lung bases left greater than right.
No new infiltrate or overt edema.

Heart size upper limits normal. Aortic Atherosclerosis
([id number]-[id number]).

No effusion. No pneumothorax.

Right shoulder arthroplasty hardware. Advanced DJD in the left
shoulder. Stable midthoracic compression deformities.
IMPRESSION: No acute cardiopulmonary disease.
